# Patient Record
Sex: Female | Born: 1965 | Race: White | Hispanic: No | Smoking: Never smoker
Health system: Southern US, Community
[De-identification: ages and names within clinical notes are randomized; demographics above are authoritative.]

## PROBLEM LIST (undated history)

## (undated) DIAGNOSIS — R112 Nausea with vomiting, unspecified: Secondary | ICD-10-CM

## (undated) DIAGNOSIS — E039 Hypothyroidism, unspecified: Secondary | ICD-10-CM

## (undated) DIAGNOSIS — S92919A Unspecified fracture of unspecified toe(s), initial encounter for closed fracture: Secondary | ICD-10-CM

## (undated) DIAGNOSIS — K631 Perforation of intestine (nontraumatic): Secondary | ICD-10-CM

## (undated) DIAGNOSIS — IMO0002 Reserved for concepts with insufficient information to code with codable children: Secondary | ICD-10-CM

## (undated) DIAGNOSIS — R51 Headache: Secondary | ICD-10-CM

## (undated) DIAGNOSIS — Z9889 Other specified postprocedural states: Secondary | ICD-10-CM

## (undated) DIAGNOSIS — Z903 Acquired absence of stomach [part of]: Secondary | ICD-10-CM

## (undated) DIAGNOSIS — M1990 Unspecified osteoarthritis, unspecified site: Secondary | ICD-10-CM

## (undated) DIAGNOSIS — R87619 Unspecified abnormal cytological findings in specimens from cervix uteri: Secondary | ICD-10-CM

## (undated) DIAGNOSIS — G43909 Migraine, unspecified, not intractable, without status migrainosus: Secondary | ICD-10-CM

## (undated) DIAGNOSIS — D510 Vitamin B12 deficiency anemia due to intrinsic factor deficiency: Secondary | ICD-10-CM

## (undated) DIAGNOSIS — E079 Disorder of thyroid, unspecified: Secondary | ICD-10-CM

## (undated) DIAGNOSIS — H269 Unspecified cataract: Secondary | ICD-10-CM

## (undated) DIAGNOSIS — R011 Cardiac murmur, unspecified: Secondary | ICD-10-CM

## (undated) DIAGNOSIS — K219 Gastro-esophageal reflux disease without esophagitis: Secondary | ICD-10-CM

## (undated) DIAGNOSIS — F419 Anxiety disorder, unspecified: Secondary | ICD-10-CM

## (undated) DIAGNOSIS — K912 Postsurgical malabsorption, not elsewhere classified: Secondary | ICD-10-CM

## (undated) DIAGNOSIS — Z87442 Personal history of urinary calculi: Secondary | ICD-10-CM

## (undated) DIAGNOSIS — F329 Major depressive disorder, single episode, unspecified: Secondary | ICD-10-CM

## (undated) DIAGNOSIS — F32A Depression, unspecified: Secondary | ICD-10-CM

## (undated) DIAGNOSIS — D649 Anemia, unspecified: Secondary | ICD-10-CM

## (undated) HISTORY — DX: Unspecified fracture of unspecified toe(s), initial encounter for closed fracture: S92.919A

## (undated) HISTORY — PX: CERVICAL BIOPSY  W/ LOOP ELECTRODE EXCISION: SUR135

## (undated) HISTORY — PX: LASIK: SHX215

## (undated) HISTORY — PX: TONSILLECTOMY: SUR1361

## (undated) HISTORY — DX: Vitamin B12 deficiency anemia due to intrinsic factor deficiency: D51.0

## (undated) HISTORY — PX: NOSE SURGERY: SHX723

## (undated) HISTORY — DX: Disorder of thyroid, unspecified: E07.9

## (undated) HISTORY — DX: Personal history of urinary calculi: Z87.442

## (undated) HISTORY — DX: Unspecified cataract: H26.9

## (undated) HISTORY — DX: Unspecified abnormal cytological findings in specimens from cervix uteri: R87.619

## (undated) HISTORY — DX: Postsurgical malabsorption, not elsewhere classified: K91.2

## (undated) HISTORY — DX: Reserved for concepts with insufficient information to code with codable children: IMO0002

## (undated) HISTORY — PX: CHOLECYSTECTOMY: SHX55

## (undated) HISTORY — DX: Anemia, unspecified: D64.9

## (undated) HISTORY — DX: Acquired absence of stomach (part of): Z90.3

## (undated) HISTORY — DX: Migraine, unspecified, not intractable, without status migrainosus: G43.909

---

## 1991-12-06 HISTORY — PX: CERVIX LESION DESTRUCTION: SHX591

## 2000-02-11 ENCOUNTER — Other Ambulatory Visit: Admission: RE | Admit: 2000-02-11 | Discharge: 2000-02-11 | Payer: Self-pay | Admitting: Obstetrics and Gynecology

## 2000-12-05 HISTORY — PX: GASTRIC BYPASS: SHX52

## 2001-03-21 ENCOUNTER — Other Ambulatory Visit: Admission: RE | Admit: 2001-03-21 | Discharge: 2001-03-21 | Payer: Self-pay | Admitting: Obstetrics and Gynecology

## 2002-02-26 ENCOUNTER — Other Ambulatory Visit: Admission: RE | Admit: 2002-02-26 | Discharge: 2002-02-26 | Payer: Self-pay | Admitting: Obstetrics and Gynecology

## 2003-04-02 ENCOUNTER — Other Ambulatory Visit: Admission: RE | Admit: 2003-04-02 | Discharge: 2003-04-02 | Payer: Self-pay | Admitting: Obstetrics and Gynecology

## 2003-05-13 ENCOUNTER — Encounter: Payer: Self-pay | Admitting: Emergency Medicine

## 2003-05-13 ENCOUNTER — Emergency Department (HOSPITAL_COMMUNITY): Admission: EM | Admit: 2003-05-13 | Discharge: 2003-05-13 | Payer: Self-pay | Admitting: Emergency Medicine

## 2003-06-10 ENCOUNTER — Ambulatory Visit (HOSPITAL_BASED_OUTPATIENT_CLINIC_OR_DEPARTMENT_OTHER): Admission: RE | Admit: 2003-06-10 | Discharge: 2003-06-10 | Payer: Self-pay | Admitting: Otolaryngology

## 2003-06-10 ENCOUNTER — Encounter (INDEPENDENT_AMBULATORY_CARE_PROVIDER_SITE_OTHER): Payer: Self-pay | Admitting: *Deleted

## 2004-03-03 ENCOUNTER — Ambulatory Visit (HOSPITAL_COMMUNITY): Admission: RE | Admit: 2004-03-03 | Discharge: 2004-03-03 | Payer: Self-pay | Admitting: Family Medicine

## 2004-03-09 ENCOUNTER — Ambulatory Visit (HOSPITAL_COMMUNITY): Admission: RE | Admit: 2004-03-09 | Discharge: 2004-03-09 | Payer: Self-pay | Admitting: Family Medicine

## 2004-04-13 ENCOUNTER — Other Ambulatory Visit: Admission: RE | Admit: 2004-04-13 | Discharge: 2004-04-13 | Payer: Self-pay | Admitting: Obstetrics and Gynecology

## 2005-01-18 ENCOUNTER — Ambulatory Visit (HOSPITAL_COMMUNITY): Admission: RE | Admit: 2005-01-18 | Discharge: 2005-01-18 | Payer: Self-pay | Admitting: Family Medicine

## 2005-07-13 ENCOUNTER — Other Ambulatory Visit: Admission: RE | Admit: 2005-07-13 | Discharge: 2005-07-13 | Payer: Self-pay | Admitting: Obstetrics and Gynecology

## 2005-08-22 ENCOUNTER — Emergency Department (HOSPITAL_COMMUNITY): Admission: EM | Admit: 2005-08-22 | Discharge: 2005-08-22 | Payer: Self-pay | Admitting: *Deleted

## 2006-12-05 HISTORY — PX: TUBAL LIGATION: SHX77

## 2006-12-09 ENCOUNTER — Inpatient Hospital Stay (HOSPITAL_COMMUNITY): Admission: AD | Admit: 2006-12-09 | Discharge: 2006-12-09 | Payer: Self-pay | Admitting: Obstetrics and Gynecology

## 2006-12-20 ENCOUNTER — Encounter (INDEPENDENT_AMBULATORY_CARE_PROVIDER_SITE_OTHER): Payer: Self-pay | Admitting: *Deleted

## 2006-12-20 ENCOUNTER — Inpatient Hospital Stay (HOSPITAL_COMMUNITY): Admission: AD | Admit: 2006-12-20 | Discharge: 2006-12-23 | Payer: Self-pay | Admitting: Obstetrics and Gynecology

## 2006-12-24 ENCOUNTER — Ambulatory Visit: Admission: RE | Admit: 2006-12-24 | Discharge: 2006-12-24 | Payer: Self-pay | Admitting: Obstetrics and Gynecology

## 2006-12-24 ENCOUNTER — Encounter: Admission: RE | Admit: 2006-12-24 | Discharge: 2007-01-23 | Payer: Self-pay | Admitting: Obstetrics and Gynecology

## 2007-01-01 ENCOUNTER — Ambulatory Visit: Admission: RE | Admit: 2007-01-01 | Discharge: 2007-01-01 | Payer: Self-pay | Admitting: Obstetrics and Gynecology

## 2007-01-24 ENCOUNTER — Encounter: Admission: RE | Admit: 2007-01-24 | Discharge: 2007-02-20 | Payer: Self-pay | Admitting: Obstetrics and Gynecology

## 2007-12-25 ENCOUNTER — Other Ambulatory Visit: Admission: RE | Admit: 2007-12-25 | Discharge: 2007-12-25 | Payer: Self-pay | Admitting: Obstetrics and Gynecology

## 2009-02-20 ENCOUNTER — Other Ambulatory Visit: Admission: RE | Admit: 2009-02-20 | Discharge: 2009-02-20 | Payer: Self-pay | Admitting: Obstetrics and Gynecology

## 2009-07-05 ENCOUNTER — Emergency Department (HOSPITAL_COMMUNITY): Admission: EM | Admit: 2009-07-05 | Discharge: 2009-07-05 | Payer: Self-pay | Admitting: Family Medicine

## 2010-01-01 ENCOUNTER — Emergency Department (HOSPITAL_COMMUNITY): Admission: EM | Admit: 2010-01-01 | Discharge: 2010-01-01 | Payer: Self-pay | Admitting: Emergency Medicine

## 2010-01-01 ENCOUNTER — Emergency Department (HOSPITAL_COMMUNITY): Admission: EM | Admit: 2010-01-01 | Discharge: 2010-01-01 | Payer: Self-pay | Admitting: Family Medicine

## 2010-03-28 ENCOUNTER — Emergency Department (HOSPITAL_COMMUNITY): Admission: EM | Admit: 2010-03-28 | Discharge: 2010-03-28 | Payer: Self-pay | Admitting: Emergency Medicine

## 2010-04-17 ENCOUNTER — Encounter: Admission: RE | Admit: 2010-04-17 | Discharge: 2010-04-17 | Payer: Self-pay | Admitting: Diagnostic Neuroimaging

## 2010-12-26 ENCOUNTER — Encounter: Payer: Self-pay | Admitting: Family Medicine

## 2011-01-31 ENCOUNTER — Inpatient Hospital Stay (INDEPENDENT_AMBULATORY_CARE_PROVIDER_SITE_OTHER)
Admission: RE | Admit: 2011-01-31 | Discharge: 2011-01-31 | Disposition: A | Discharge status: Home / Self Care | Payer: 59 | Source: Ambulatory Visit | Attending: Family Medicine | Admitting: Family Medicine

## 2011-01-31 DIAGNOSIS — J019 Acute sinusitis, unspecified: Secondary | ICD-10-CM

## 2011-02-20 LAB — POCT I-STAT, CHEM 8
BUN: 8 mg/dL (ref 6–23)
Calcium, Ion: 1.18 mmol/L (ref 1.12–1.32)
Chloride: 104 mEq/L (ref 96–112)
Glucose, Bld: 96 mg/dL (ref 70–99)
HCT: 44 % (ref 36.0–46.0)
Hemoglobin: 15 g/dL (ref 12.0–15.0)
Sodium: 137 mEq/L (ref 135–145)

## 2011-02-20 LAB — GLUCOSE, CAPILLARY
Glucose-Capillary: 110 mg/dL — ABNORMAL HIGH (ref 70–99)
Glucose-Capillary: 61 mg/dL — ABNORMAL LOW (ref 70–99)
Glucose-Capillary: 73 mg/dL (ref 70–99)

## 2011-02-20 LAB — POCT PREGNANCY, URINE: Preg Test, Ur: NEGATIVE

## 2011-03-16 LAB — HM PAP SMEAR

## 2011-04-22 NOTE — Op Note (Signed)
   [name], [name]                            ACCOUNT NO.:  [id number]   MEDICAL RECORD NO.:  [id number]                   PATIENT TYPE:  AMB   LOCATION:  DSC                                  FACILITY:  [facility]   PHYSICIAN:  Christopher E. Ezzard Standing, M.D.         DATE OF BIRTH:  September 15, 1966   DATE OF PROCEDURE:  06/10/2003  DATE OF DISCHARGE:                                 OPERATIVE REPORT   PREOPERATIVE DIAGNOSIS:  Recurrent tonsillitis.   POSTOPERATIVE DIAGNOSIS:  Recurrent tonsillitis.   OPERATION:  Tonsillectomy.   SURGEON:  Kristine Garbe. Ezzard Standing, M.D.   ANESTHESIA:  General endotracheal.   COMPLICATIONS:  None.   BRIEF CLINICAL NOTE:  Megan Davila is a 45 year old female who has had  several tonsil infections over the last several years.  Currently three or  four positive strep tests a year.  Because of recurrent strep, she is taken  to the operating room at this time for a tonsillectomy.   DESCRIPTION OF PROCEDURE:  After adequate endotracheal anesthesia, a mouth  gag is used to expose the oropharynx.  The left and right tonsils were  dissected from the tonsillar fossae using a cautery. Care was taken to  preserve the anterior and posterior tonsillar pillars as well as the uvula.  The tonsils were cryptic, embedded, with a large amount of cryptic disease  within the tonsil.  Hemostasis was obtained with cautery.  After obtaining  adequate hemostasis, the procedure was completed.  Megan Davila was awoken from  anesthesia and transferred to the recovery room postop doing well.   DISPOSITION:  Megan Davila is discharged home later this morning on amoxicillin  suspension 400 mg b.i.d. for one week, Tylenol and Lortab Elixir 15-30 mL  q.4h. p.r.n. pain.  We will have her follow up in my office in two weeks for  recheck.                                               Kristine Garbe. Ezzard Standing, M.D.    CEN/MEDQ  D:  06/10/2003  T:  06/10/2003  Job:  [id number]

## 2011-04-22 NOTE — Op Note (Signed)
Megan Davila, Megan Davila                ACCOUNT NO.:  [id number]   MEDICAL RECORD NO.:  [id number]          PATIENT TYPE:  INP   LOCATION:  9120                          FACILITY:  [facility]   PHYSICIAN:  Randye Lobo, M.D.   DATE OF BIRTH:  [birth year]   DATE OF PROCEDURE:  12/20/2006  DATE OF DISCHARGE:                               OPERATIVE REPORT   PREOPERATIVE DIAGNOSIS:  1. Intrauterine gestation at 36 weeks.  2. Failure to progress.  3. Desire for permanent sterilization.   POSTOPERATIVE DIAGNOSIS:  1. Intrauterine gestation at 36 weeks.  2. Failure to progress.  3. Occiput posterior position.  4. Desire for permanent sterilization.   PROCEDURE:  Primary low segment transverse cesarean section with  ligation of descending branch of left uterine artery, bilateral tubal  ligation by the modified Pomeroy technique.   SURGEON:  Randye Lobo, M.D.   ANESTHESIA:  Epidural.   IV FLUIDS:  3000 mL Ringer's lactate.   ESTIMATED BLOOD LOSS:  800 mL.   URINE OUTPUT:  300 mL.   COMPLICATIONS:  None.   INDICATIONS FOR PROCEDURE:  The patient is a 45 year old gravida 2, para  0-0-1-0, Caucasian female admitted at 47 weeks' gestation (EDC of  January 17, 2007, by 13 week ultrasound), who presented with  contractions and spontaneous rupture of membranes at 0500 on the day of  admission.  The amniotic fluid was clear.  Upon arrival to [facility] [place], the patient was noted to be 9 cm dilated and was  immediately brought down to the labor and delivery suite.  The patient's  antepartum course had been significant for an unknown group B Strep  status.  The patient also had advanced maternal age status and declined  genetic testing after having an ultrasound and genetic counseling  performed by the high risk OB service at [facility].  The patient  did have a succenturiate lobe of the placenta.  There was a family  history of familial hemophilia.  The patient  previously had a history of  gastric bypass surgery along with cryotherapy and a LEEP procedure to  the cervix.   Upon admission, the fetal heart rate tracing was reassuring.  The  patient received ampicillin IV for group B strep prophylaxis.  She did  go on to receive some Stadol for analgesia and eventually was given an  epidural when her cervix did not progress quickly beyond 9 cm.  At one  point, the patient was thought to be completely dilated with the vertex  in the occiput posterior position and evidence of some caput.  The  patient did have some irregular and mild contractions at this time and  after initially giving an effort for pushing, the patient was allowed to  stop and she received some Pitocin.   Upon further reassessment of the patient's labor, the patient's cervix  was noted to be only 9 cm dilated with a swollen anterior lip and the  vertex at 1+ station.  The fetal heart rate tracing did remain  reassuring throughout the patient's labor.  She was  given a diagnosis at  this time of failure to progress and a plan was made to proceed with a  primary low segment transverse cesarean section.  The patient did  request permanent sterilization and a plan was also made to proceed with  a bilateral tubal ligation.  The risks, benefits, and alternatives of  the above were discussed with the patient who wished to proceed.  The  patient understood the nature of the tubal ligation being a permanent  procedure, although with a failure rate of approximately 1 to 250 to 1  in 300 which may result in either an intrauterine or ectopic pregnancy.   FINDINGS:  A viable female was delivered at 1500 in the occiput posterior  position.  Apgars were 9 at 1 minute and 9 at 5 minutes.  The weight was  5 pounds 4 ounces.  The amniotic fluid was clear.  The placenta was  noted to be densely adherent to the superior and anterior uterine fundus  and it was removed with some effort and ultimately  sent to pathology.  The uterus, tubes and ovaries were noted to be unremarkable.   There was an extension of the lower uterine segment incision on the  patient's left hand side and there was bleeding from the descending  branch of the uterine artery on that side which was ultimately  controlled with a Antony Salmon type stitch of both 0 chromic and then  ultimately 2-0 chromic.  The ureter was noted to palpate lateral to the  sutures that were placed immediately along the left lateral vagina.   SPECIMENS:  The placenta was sent to pathology.   DESCRIPTION OF PROCEDURE:  The patient was escorted from her labor and  delivery suite down to the operating room.  Her epidural was dosed for  surgical anesthesia.  The patient was placed in the supine position with  a left lateral tilt.  A Foley catheter had been previously placed inside  the bladder.  The abdomen was sterilely prepped and draped.   A Pfannenstiel incision was created sharply with a scalpel.  It was  carried down to the fascia using a combination of sharp dissection with  a scalpel and with monopolar cautery to create hemostasis.  The fascia  was then incised in the midline with a scalpel and the incision was  extended bilaterally with the Mayo scissors.  The rectus muscles were  dissected off of the fascia superiorly and inferiorly.  The rectus  muscles were then sharply divided in the midline.  The parietal  peritoneum was elevated with two hemostat clamps and entered sharply.  The incision was extended cranially and caudally.   The lower uterine segment was exposed with the bladder retractor and the  bladder flap was then sharply created.  A transverse lower uterine  segment incision was then created with a scalpel and the incision was  extended bilaterally in an upward fashion using blunt dissection.  A  hand was inserted through the uterine incision and the vertex was delivered through the incision without difficulty followed  by the  remainder of the infant.  The nares and mouth were suctioned and the  remainder of the infant was completely delivered and the baby was noted  to be in vigorous condition.  The cord was doubly clamped and cut and  the newborn was carried over to the awaiting pediatricians.   At this time, the placenta was manually extracted and noted to be very  densely adherent to  the uterine fundus.  Manual curettage was performed  to ensure entire removal of the placental specimen.  Lap pads were also  used to perform a gentle curettage of the uterine fundus.  No remaining  products of conception were appreciated.  The uterus was exteriorized  for its closure.  The patient did receive Ancef 1 gram IV at cord clamp  along with Pitocin 20 units IV after removal of the placenta.  The  uterine incision was closed beginning at the extension on the patient's  left hand side and continuing to the apex on the right hand side.  The  first layer was a running locked layer and the second was an imbricating  layer of #1 chromic.   The tubal ligation was performed next.  The left fallopian tube was  grasped with a Babcock clamp and followed to its fimbriated end.  It was  then free tied with a free tie of 0 plain that suture to create a  knuckle of tissue along the isthmic portion of the tube.  A hemostat was  brought through the mesosalpinx and an additional free tie of 2-0 plain  gut suture was placed at the base of each knuckle of tissue.  The  intervening portion of the fallopian tube was excised and sent to  pathology.  The same procedure that was performed on the left hand side  was then repeated on the right hand side after the right fallopian tube  was grasped and followed to its fimbriated end.  Again, the portion of  the right fallopian tube was sent to pathology.   At this time, the operative sites were all re-examined and there was  some bleeding noted at the angle on the patient's left hand  side were  the extension was which extended down into the vagina.  There was a  pumping vessel that appeared to be a descending branch of the uterine  artery.  Ther ureter was identified and an O'Leary stitch of #1 chromic  was placed.  However, this initially did not control the bleeding well.  Two additional figure-of-eight sutures of 2-0 chromic were placed and  these did control the bleeding well.   The uterus was returned to the peritoneal cavity which was irrigated and  suctioned and all operative sites were examined and found to be  hemostatic.  The abdomen was closed at this time.  The parietal  peritoneum was closed with a running suture of 3-0 Vicryl.  The rectus  muscles were reapproximated in the midline with interrupted sutures of  #1 chromic.  The fascia was closed with a running suture of zero Vicryl.  The subcutaneous tissue was irrigated and suctioned and made hemostatic with monopolar cautery.  Interrupted sutures of 2-0 plain were placed in  the subcutaneous layer and the skin was closed with staples.  A sterile  pressure bandage was placed over this.   This concluded the patient's procedure.  There were no complications.  All needle, instrument, and sponge counts were correct.      Randye Lobo, M.D.  Electronically Signed     BES/MEDQ  D:  12/20/2006  T:  12/20/2006  Job:  [id number]

## 2011-04-22 NOTE — Discharge Summary (Signed)
Megan, Davila                ACCOUNT NO.:  [id number]   MEDICAL RECORD NO.:  [id number]          PATIENT TYPE:  INP   LOCATION:  9120                          FACILITY:  [facility]   PHYSICIAN:  Ilda Mori, M.D.   DATE OF BIRTH:  [birth year]   DATE OF ADMISSION:  12/20/2006  DATE OF DISCHARGE:  12/23/2006                               DISCHARGE SUMMARY   FINAL DIAGNOSES:  1. Intrauterine pregnancy at 57 weeks' gestation.  2. Spontaneous rupture of membranes, in early labor.  3. Failure to progress.  4. Occiput posterior presentation.  5. Desire for permanent sterilization.   PROCEDURE:  Primary low segment transverse cesarean section with  ligation of descending branch of left uterine artery and bilateral tubal  ligation using the modified Pomeroy technique.  Surgeon:  Dr. Conley Simmonds.  Complications:  None.   This 45 year old G2, P 0-0-1-0, presents at 73 weeks' gestation with  spontaneous rupture of membranes early that morning and onset of regular  contractions.  When the patient arrived at the hospital she was already  9 cm dilated, was brought directly to labor and delivery.  The patient's  antepartum course up to this point had been complicated by advanced  maternal age.  The patient declined fetal screening.  She did have some  genetic counseling but no amniocentesis or first trimester screening was  obtained.  The patient also had a history of gastric bypass, a history  of abnormal Pap smears.  She had had a conization performed as well as a  LEEP and also in her pregnancy there was noted to be a succenturiate  lobe of the placenta that was noted on ultrasound.  Her group B strep  status at this time was unknown.  She was started on some ampicillin  secondary to her unknown status and Stadol for comfort.  Once the  patient was given an epidural for her pain control, her cervix had not  progressed beyond 9 cm of dilation.  At one point the patient was  thought to be  completely dilated with the vertex in the occiput  posterior presentation and some evidence of caput was noted.  The  patient was having irregular mild contractions at this time.  The  patient when she was further examined was noted to only be 9 cm dilated  with a swollen anterior lip at a +1 station.  She was given a diagnosis  of failure to progress at this time was taken to the operating room for  cesarean section.  The patient also expressed her desire for permanent  sterilization and a discussion was held with the patient about the risks  and the benefit.  She was taken to the operating room on December 20, 2006, by Dr. Conley Simmonds, where a primary low segment transverse  cesarean section was performed with the delivery of a 5 pound 4 ounce  female infant with Apgars of 9 and 9.  The delivery went without  complication.  After delivery the placenta was manually extracted and  noticed to be densely adherent to the uterine fundus.  Manual curettage  was performed at this time.  The patient did receive 1 g of IV Ancef at  this point at the cord clamping.  At this point a bilateral tubal  ligation was performed without complications.  Once everything was  performed, the operative sites were well-examined, there was noted just  to be some bleeding on the patient's left side which extended down into  the vagina, and there was a pumping vessel that was descending off the  uterine artery.  At this point this was ligated with additional sutures  and the bleeding was under good control.  The patient was taken to the  recovery room without complications.  Her postoperative course was  benign without any significant fevers.  She did have some postoperative  blood pressures which started to elevate.  She was started on oral  Procardia 10 mg t.i.d.  By postoperative day #3 the patient was still  having some mild postpartum hypertension but was ready for discharge.  She was sent home on a regular  diet, told to decrease her activities,  told to continue her prenatal vitamins and Procardia XL 30 mg once in  the morning, was given Tylox #30 one to two every 4-6 hours as needed  for pain, and was to follow up in our office in 4 weeks and, of course,  to call with any increased bleeding, pain or problems.   LABS ON DISCHARGE:  The patient had a hemoglobin of 9.6, white blood  cell count of 7.7, and platelets of 208,000.      Leilani Able, P.A.-C.      Ilda Mori, M.D.  Electronically Signed    MB/MEDQ  D:  01/22/2007  T:  01/22/2007  Job:  [id number]

## 2011-09-16 ENCOUNTER — Other Ambulatory Visit: Payer: Self-pay | Admitting: Obstetrics and Gynecology

## 2011-10-03 ENCOUNTER — Other Ambulatory Visit: Payer: Self-pay | Admitting: Obstetrics and Gynecology

## 2011-10-06 ENCOUNTER — Encounter (HOSPITAL_COMMUNITY)
Admission: RE | Admit: 2011-10-06 | Discharge: 2011-10-06 | Disposition: A | Discharge status: Home / Self Care | Payer: 59 | Source: Ambulatory Visit | Attending: Obstetrics and Gynecology | Admitting: Obstetrics and Gynecology

## 2011-10-06 ENCOUNTER — Encounter (HOSPITAL_COMMUNITY): Payer: Self-pay

## 2011-10-06 HISTORY — DX: Gastro-esophageal reflux disease without esophagitis: K21.9

## 2011-10-06 HISTORY — DX: Headache: R51

## 2011-10-06 HISTORY — DX: Anxiety disorder, unspecified: F41.9

## 2011-10-06 HISTORY — DX: Cardiac murmur, unspecified: R01.1

## 2011-10-06 HISTORY — DX: Depression, unspecified: F32.A

## 2011-10-06 HISTORY — DX: Major depressive disorder, single episode, unspecified: F32.9

## 2011-10-06 HISTORY — DX: Other specified postprocedural states: Z98.890

## 2011-10-06 HISTORY — DX: Other specified postprocedural states: R11.2

## 2011-10-06 LAB — CBC
HCT: 41.8 % (ref 36.0–46.0)
Hemoglobin: 13.7 g/dL (ref 12.0–15.0)
MCHC: 32.8 g/dL (ref 30.0–36.0)
Platelets: 314 10*3/uL (ref 150–400)
RDW: 13.1 % (ref 11.5–15.5)

## 2011-10-06 NOTE — Patient Instructions (Signed)
YOUR PROCEDURE IS SCHEDULED ON: 10/14/11  ENTER THROUGH THE MAIN ENTRANCE OF [facility] AT:1030am  USE DESK PHONE AND DIAL [id number] TO INFORM us OF YOUR ARRIVAL  CALL [phone] IF YOU HAVE ANY QUESTIONS OR PROBLEMS PRIOR TO YOUR ARRIVAL.  REMEMBER: DO NOT EAT AFTER MIDNIGHT :  SPECIAL INSTRUCTIONS: clear liquids ok until 8am Fri   YOU MAY BRUSH YOUR TEETH THE MORNING OF SURGERY   TAKE THESE MEDICINES THE DAY OF SURGERY before 8am: Xanax,Prevacid, Prozac   DO NOT WEAR JEWELRY, EYE MAKEUP, LIPSTICK OR DARK FINGERNAIL POLISH DO NOT WEAR LOTIONS OR DEODORANT DO NOT SHAVE FOR 48 HOURS PRIOR TO SURGERY  YOU WILL NOT BE ALLOWED TO DRIVE YOURSELF HOME.  NAME OF DRIVER: Aviya Jarvie- [phone]

## 2011-10-14 ENCOUNTER — Ambulatory Visit (HOSPITAL_COMMUNITY)
Admission: RE | Admit: 2011-10-14 | Discharge: 2011-10-14 | Disposition: A | Discharge status: Home / Self Care | Payer: 59 | Source: Ambulatory Visit | Attending: Obstetrics and Gynecology | Admitting: Obstetrics and Gynecology

## 2011-10-14 ENCOUNTER — Other Ambulatory Visit: Payer: Self-pay

## 2011-10-14 ENCOUNTER — Encounter (HOSPITAL_COMMUNITY): Payer: Self-pay | Admitting: Anesthesiology

## 2011-10-14 ENCOUNTER — Ambulatory Visit (HOSPITAL_COMMUNITY): Payer: 59 | Admitting: Anesthesiology

## 2011-10-14 ENCOUNTER — Encounter (HOSPITAL_COMMUNITY)
Admission: RE | Disposition: A | Discharge status: Home / Self Care | Payer: Self-pay | Source: Ambulatory Visit | Attending: Obstetrics and Gynecology

## 2011-10-14 DIAGNOSIS — Z01812 Encounter for preprocedural laboratory examination: Secondary | ICD-10-CM | POA: Insufficient documentation

## 2011-10-14 DIAGNOSIS — Z01818 Encounter for other preprocedural examination: Secondary | ICD-10-CM | POA: Insufficient documentation

## 2011-10-14 DIAGNOSIS — N920 Excessive and frequent menstruation with regular cycle: Secondary | ICD-10-CM | POA: Insufficient documentation

## 2011-10-14 DIAGNOSIS — N840 Polyp of corpus uteri: Secondary | ICD-10-CM | POA: Insufficient documentation

## 2011-10-14 HISTORY — PX: HYSTEROSCOPY WITH D & C: SHX1775

## 2011-10-14 LAB — URINALYSIS, ROUTINE W REFLEX MICROSCOPIC
Glucose, UA: NEGATIVE mg/dL
Hgb urine dipstick: NEGATIVE
Ketones, ur: 15 mg/dL — AB
Leukocytes, UA: NEGATIVE
Nitrite: NEGATIVE
Protein, ur: NEGATIVE mg/dL
Specific Gravity, Urine: >1.03 — ABNORMAL HIGH (ref 1.005–1.030)
Urobilinogen, UA: 1 mg/dL (ref 0.0–1.0)
pH: 5.5 (ref 5.0–8.0)

## 2011-10-14 LAB — PREGNANCY, URINE: Preg Test, Ur: NEGATIVE

## 2011-10-14 SURGERY — DILATATION AND CURETTAGE /HYSTEROSCOPY
Anesthesia: General | Site: Vagina | Wound class: Clean Contaminated

## 2011-10-14 MED ORDER — LIDOCAINE HCL (CARDIAC) 20 MG/ML IV SOLN
INTRAVENOUS | Status: DC | PRN
  Administered 2011-10-14: 60 mg via INTRAVENOUS

## 2011-10-14 MED ORDER — LIDOCAINE HCL (CARDIAC) 20 MG/ML IV SOLN
INTRAVENOUS | Status: AC
  Filled 2011-10-14: qty 5

## 2011-10-14 MED ORDER — MIDAZOLAM HCL 2 MG/2ML IJ SOLN
INTRAMUSCULAR | Status: AC
  Filled 2011-10-14: qty 2

## 2011-10-14 MED ORDER — FENTANYL CITRATE 0.05 MG/ML IJ SOLN
INTRAMUSCULAR | Status: AC
  Filled 2011-10-14: qty 2

## 2011-10-14 MED ORDER — SCOPOLAMINE 1 MG/3DAYS TD PT72
MEDICATED_PATCH | TRANSDERMAL | Status: AC
  Administered 2011-10-14: 1.5 mg via TRANSDERMAL
  Filled 2011-10-14: qty 1

## 2011-10-14 MED ORDER — DEXAMETHASONE SODIUM PHOSPHATE 10 MG/ML IJ SOLN
INTRAMUSCULAR | Status: AC
  Filled 2011-10-14: qty 1

## 2011-10-14 MED ORDER — CEFAZOLIN SODIUM 1-5 GM-% IV SOLN
1.0000 g | INTRAVENOUS | Status: AC
  Administered 2011-10-14: 1 g via INTRAVENOUS

## 2011-10-14 MED ORDER — PROPOFOL 10 MG/ML IV EMUL
INTRAVENOUS | Status: AC
  Filled 2011-10-14: qty 20

## 2011-10-14 MED ORDER — HYDROCODONE-ACETAMINOPHEN 5-325 MG PO TABS
ORAL_TABLET | ORAL | Status: AC
  Administered 2011-10-14: 1 via ORAL
  Filled 2011-10-14: qty 1

## 2011-10-14 MED ORDER — MIDAZOLAM HCL 5 MG/5ML IJ SOLN
INTRAMUSCULAR | Status: DC | PRN
  Administered 2011-10-14: 2 mg via INTRAVENOUS

## 2011-10-14 MED ORDER — SCOPOLAMINE 1 MG/3DAYS TD PT72
1.0000 | MEDICATED_PATCH | TRANSDERMAL | Status: DC
  Administered 2011-10-14: 1.5 mg via TRANSDERMAL

## 2011-10-14 MED ORDER — HYDROCODONE-ACETAMINOPHEN 5-500 MG PO TABS: 1.0000 | ORAL_TABLET | ORAL | Status: AC | PRN

## 2011-10-14 MED ORDER — LIDOCAINE HCL 1 % IJ SOLN
INTRAMUSCULAR | Status: DC | PRN
  Administered 2011-10-14: 10 mL

## 2011-10-14 MED ORDER — DEXAMETHASONE SODIUM PHOSPHATE 4 MG/ML IJ SOLN
INTRAMUSCULAR | Status: DC | PRN
  Administered 2011-10-14: 10 mg via INTRAVENOUS

## 2011-10-14 MED ORDER — ONDANSETRON HCL 4 MG/2ML IJ SOLN
INTRAMUSCULAR | Status: AC
  Filled 2011-10-14: qty 2

## 2011-10-14 MED ORDER — HYDROCODONE-ACETAMINOPHEN 5-325 MG PO TABS
1.0000 | ORAL_TABLET | Freq: Once | ORAL | Status: AC
  Administered 2011-10-14: 1 via ORAL

## 2011-10-14 MED ORDER — PROPOFOL 10 MG/ML IV EMUL
INTRAVENOUS | Status: DC | PRN
  Administered 2011-10-14: 200 mg via INTRAVENOUS

## 2011-10-14 MED ORDER — FENTANYL CITRATE 0.05 MG/ML IJ SOLN
INTRAMUSCULAR | Status: DC | PRN
  Administered 2011-10-14: 100 ug via INTRAVENOUS

## 2011-10-14 MED ORDER — FENTANYL CITRATE 0.05 MG/ML IJ SOLN: 25.0000 ug | INTRAMUSCULAR | Status: DC | PRN

## 2011-10-14 MED ORDER — LACTATED RINGERS IV SOLN
INTRAVENOUS | Status: DC
  Administered 2011-10-14: 50 mL/h via INTRAVENOUS
  Administered 2011-10-14: 11:00:00 via INTRAVENOUS

## 2011-10-14 MED ORDER — LACTATED RINGERS IV SOLN
INTRAVENOUS | Status: DC | PRN
  Administered 2011-10-14: 3000 mL via INTRAUTERINE

## 2011-10-14 MED ORDER — ONDANSETRON HCL 4 MG/2ML IJ SOLN
INTRAMUSCULAR | Status: DC | PRN
  Administered 2011-10-14: 4 mg via INTRAVENOUS

## 2011-10-14 SURGICAL SUPPLY — 11 items
ABLATOR ENDOMETRIAL BIPOLAR (ABLATOR) IMPLANT
CATH ROBINSON RED A/P 16FR (CATHETERS) ×3 IMPLANT
CLOTH BEACON ORANGE TIMEOUT ST (SAFETY) ×3 IMPLANT
CONTAINER PREFILL 10% NBF 60ML (FORM) ×3 IMPLANT
GLOVE BIO SURGEON STRL SZ 6.5 (GLOVE) ×6 IMPLANT
GOWN PREVENTION PLUS LG XLONG (DISPOSABLE) ×6 IMPLANT
NEEDLE SPNL 22GX3.5 QUINCKE BK (NEEDLE) ×3 IMPLANT
PACK HYSTEROSCOPY LF (CUSTOM PROCEDURE TRAY) ×3 IMPLANT
SYR CONTROL 10ML LL (SYRINGE) ×3 IMPLANT
TOWEL OR 17X24 6PK STRL BLUE (TOWEL DISPOSABLE) ×6 IMPLANT
WATER STERILE IRR 1000ML POUR (IV SOLUTION) ×3 IMPLANT

## 2011-10-14 NOTE — Transfer of Care (Signed)
Immediate Anesthesia Transfer of Care Note  Patient: Megan Davila  Procedure(s) Performed:  DILATATION AND CURETTAGE (D&C) /HYSTEROSCOPY  Patient Location: PACU  Anesthesia Type: General  Level of Consciousness: sedated  Airway & Oxygen Therapy: Patient Spontanous Breathing and Patient connected to nasal cannula oxygen  Post-op Assessment: Report given to PACU RN and Post -op Vital signs reviewed and stable  Post vital signs: Reviewed and stable  Complications: No apparent anesthesia complications

## 2011-10-14 NOTE — Anesthesia Preprocedure Evaluation (Addendum)
Anesthesia Evaluation  Patient identified by MRN, date of birth, ID band Patient awake    Reviewed: Allergy & Precautions, H&P , NPO status , Patient's Chart, lab work & pertinent test results, reviewed documented beta blocker date and time   History of Anesthesia Complications (+) PONV  Airway Mallampati: I      Dental  (+) Teeth Intact   Pulmonary neg pulmonary ROS,  clear to auscultation  Pulmonary exam normal       Cardiovascular Exercise Tolerance: Good + Valvular Problems/Murmurs (h/o heart murmur (not that I can detect)) regular Normal    Neuro/Psych  Headaches (migraines on topamax prn), PSYCHIATRIC DISORDERS (anxiety/depression)    GI/Hepatic Neg liver ROS, GERD-  Medicated,  Endo/Other  Negative Endocrine ROSS/p gastric bypass, lost 140#  Renal/GU negative Renal ROS  Genitourinary negative   Musculoskeletal   Abdominal   Peds  Hematology negative hematology ROS (+)   Anesthesia Other Findings   Reproductive/Obstetrics negative OB ROS                          Anesthesia Physical Anesthesia Plan  ASA: II  Anesthesia Plan: General   Post-op Pain Management:    Induction:   Airway Management Planned: LMA  Additional Equipment:   Intra-op Plan:   Post-operative Plan:   Informed Consent: I have reviewed the patients History and Physical, chart, labs and discussed the procedure including the risks, benefits and alternatives for the proposed anesthesia with the patient or authorized representative who has indicated his/her understanding and acceptance.   Dental Advisory Given  Plan Discussed with: CRNA and Surgeon  Anesthesia Plan Comments:         Anesthesia Quick Evaluation

## 2011-10-14 NOTE — Anesthesia Postprocedure Evaluation (Signed)
Anesthesia Post Note  Patient: Megan Davila  Procedure(s) Performed:  DILATATION AND CURETTAGE (D&C) /HYSTEROSCOPY  Anesthesia type: General  Patient location: PACU  Post pain: Pain level controlled  Post assessment: Post-op Vital signs reviewed  Last Vitals:  Filed Vitals:   10/14/11 1400  BP: 113/71  Pulse: 81  Temp: 37.1 C  Resp: 20    Post vital signs: Reviewed  Level of consciousness: sedated  Complications: No apparent anesthesia complications

## 2011-10-14 NOTE — Progress Notes (Signed)
Pre-op Note  Patient examined this morning.    OK to proceed.

## 2011-10-14 NOTE — Op Note (Signed)
NAMESANAE, [name] NO.:  [id number]  MEDICAL RECORD NO.:  [id number]  LOCATION:  [facility]                          FACILITY:  [facility]  PHYSICIAN:  Randye Lobo, M.D.   DATE OF BIRTH:  07/14/1966  DATE OF PROCEDURE:  10/14/2011 DATE OF DISCHARGE:                              OPERATIVE REPORT   PREOPERATIVE DIAGNOSIS:  Menometrorrhagia, endometrial polyp.  POSTOPERATIVE DIAGNOSIS:  Menometrorrhagia, intrauterine adhesions.  PROCEDURES:  Hysteroscopy and dilatation and curettage.  SURGEON:  Conley Simmonds, MD  ANESTHESIA:  General endotracheal.  IV FLUIDS:  1000 mL Ringer's lactate.  ESTIMATED BLOOD LOSS:  5 mL.  URINE OUTPUT:  5 mL.  LACTATED RINGER'S DEFICIT:  100 mL.  COMPLICATIONS:  None.  INDICATIONS FOR THE PROCEDURE:  The patient is a 45 year old, gravida 2, para 1-0-1-1 Caucasian female, who presented to the office with irregular and heavy menstrual bleeding with clotting onset in the spring of 2012.  The patient had temporary improvement with Prometrium therapy. When the patient had recurrence of her symptoms, an ultrasound was performed on September 16, 2011 which documented a uterus with small cystic areas in the endometrium.  The endometrial stripe measured 8.22 mm.  There was a simple 1.4 cm left ovarian cyst.  An endometrial biopsy on September 16, 2011 documented a benign endometrial polyp with disordered proliferative endometrium.  The patient is status post bilateral tubal ligation and she declines future childbearing.  A plan is now made to proceed with a hysteroscopy with dilation and curettage, endometrial polypectomy, and NovaSure endometrial ablation after risks, benefits, alternatives,and surgical goals have been discussed.  FINDINGS:  Exam under anesthesia revealed a small anteverted mobile uterus.  No adnexal masses were appreciated. Hysteroscopy demonstrated significant intrauterine scarring.  The uterus appeared to have a tubular  shape.  At first there was a question about whether or not there was a false passage that was created, however, it appeared that this was not the case and that the patient actually had a very scarred endometrial cavity.  There was no evidence of any obvious polyps or fibroids.  SPECIMEN:  Endometrial curettings were sent to pathology.  PROCEDURE IN DETAIL:  The patient was reidentified in the preoperative hold area.  She did receive Ancef 1 g IV for antibiotic prophylaxis and she received TED hose and PAS stockings for DVT prophylaxis.  The patient was taken to the operating room where general endotracheal anesthesia was induced.  She was placed in the dorsal lithotomy position.  The abdomen, vagina, and perineum were then sterilely prepped and draped.  Her bladder was catheterized of urine.  An exam under anesthesia was performed. A speculum was placed inside the vagina and a single-tooth tenaculum was placed on the anterior cervical lip.  The uterus was sounded to 7 cm at this time.  There was some scarring of the cervix but the sound was able to pass easily.  The cervix was then sequentially dilated to a #23 Pratt dilator.  The cervical length was then measured at 3.5 cm.  The diagnostic hysteroscope was inserted under the continuous infusion of lactated Ringer's solution.  The findings are as noted above.  The cervix  was dilated further to a #25 Pratt dilator and again the hysteroscope was carefully inserted to see if it was passing through a false channel.  This did not appear to be the case.  The hysteroscope was withdrawn at this time and a serrated curette was introduced into the endometrial cavity and curettings were performed and sent to pathology.  The procedure was concluded at this time.  The NovaSure endometrial ablation was not recommended due to the cavity shape.  A rollerball ablation was also not performed due to distortion of the patient's anatomy.  The patient  was cleansed of Betadine, awakened, and escorted to the recovery room in stable condition.  There were no complications to the procedure.  All needle, instrument, and sponge counts were correct.     Randye Lobo, M.D.     BES/MEDQ  D:  10/14/2011  T:  10/14/2011  Job:  [id number]

## 2011-10-14 NOTE — Brief Op Note (Signed)
10/14/2011  12:53 PM  PATIENT:  Megan Davila  45 y.o. female  PRE-OPERATIVE DIAGNOSIS:  MENOMETRORRHAGIA  POST-OPERATIVE DIAGNOSIS:  Menometrorrhagia, Intrauterine adhesions  PROCEDURE:  Procedure(s): DILATATION AND CURETTAGE (D&C) /HYSTEROSCOPY  SURGEON:  Surgeon(s): Jajuan Skoog A Silva  PHYSICIAN ASSISTANT:   ASSISTANTS:   ANESTHESIA:   general  EBL:  Total I/O In: 700 [I.V.:700] Out: 10 [Urine:5; Blood:5]  BLOOD ADMINISTERED:none  DRAINS: none   LOCAL MEDICATIONS USED:  LIDOCAINE 10 CC  SPECIMEN:  Source of Specimen:  Endometrial curettings  DISPOSITION OF SPECIMEN:  PATHOLOGY  COUNTS:  YES  TOURNIQUET:  * No tourniquets in log *  DICTATION: .Other Dictation: Dictation Number    PLAN OF CARE: Discharge to home after PACU  PATIENT DISPOSITION:  PACU - hemodynamically stable.   Delay start of Pharmacological VTE agent (>24hrs) due to surgical blood loss or risk of bleeding:  {YES/NO/NOT APPLICABLE:20182

## 2011-10-18 ENCOUNTER — Encounter (HOSPITAL_COMMUNITY): Payer: Self-pay | Admitting: Obstetrics and Gynecology

## 2012-02-10 ENCOUNTER — Emergency Department (INDEPENDENT_AMBULATORY_CARE_PROVIDER_SITE_OTHER)
Admission: EM | Admit: 2012-02-10 | Discharge: 2012-02-10 | Disposition: A | Discharge status: Home / Self Care | Payer: 59 | Source: Home / Self Care | Attending: Family Medicine | Admitting: Family Medicine

## 2012-02-10 ENCOUNTER — Emergency Department (INDEPENDENT_AMBULATORY_CARE_PROVIDER_SITE_OTHER): Payer: 59

## 2012-02-10 ENCOUNTER — Encounter (HOSPITAL_COMMUNITY): Payer: Self-pay

## 2012-02-10 DIAGNOSIS — J069 Acute upper respiratory infection, unspecified: Secondary | ICD-10-CM

## 2012-02-10 MED ORDER — DEXTROMETHORPHAN POLISTIREX 30 MG/5ML PO LQCR: 60.0000 mg | ORAL | Status: AC | PRN

## 2012-02-10 MED ORDER — HYDROCOD POLST-CHLORPHEN POLST 10-8 MG/5ML PO LQCR: 5.0000 mL | Freq: Two times a day (BID) | ORAL | Status: DC

## 2012-02-10 MED ORDER — AZITHROMYCIN 250 MG PO TABS: ORAL_TABLET | ORAL | Status: AC

## 2012-02-10 NOTE — ED Notes (Addendum)
C/o sick w cough, congestion; has reportedly coughed so much she has lost her voice, having green tinted secretions; NAD at present Sa her primary MD first of week, instructed to use Delsym (no relief) c/o syx are getting worse

## 2012-02-10 NOTE — ED Provider Notes (Signed)
History     CSN: [id number]  Arrival date & time 02/10/12  0911   First MD Initiated Contact with Patient 02/10/12 1024      Chief Complaint  Patient presents with  . Cough    (Consider location/radiation/quality/duration/timing/severity/associated sxs/prior treatment) Patient is a 46 y.o. female presenting with cough. The history is provided by the patient.  Cough This is a new problem. The current episode started more than 2 days ago. The problem has been gradually worsening. The cough is productive of purulent sputum. Associated symptoms include rhinorrhea and sore throat. She is not a smoker.    Past Medical History  Diagnosis Date  . Heart murmur   . GERD (gastroesophageal reflux disease)   . Headache   . Anxiety   . Depression   . PONV (postoperative nausea and vomiting)     Past Surgical History  Procedure Date  . Gastric bypass 2002  . Cholecystectomy   . Tubal ligation   . Cesarean section   . Tonsillectomy   . Lasik   . Nose surgery   . Hysteroscopy w/d&c 10/14/2011    Procedure: DILATATION AND CURETTAGE (D&C) /HYSTEROSCOPY;  Surgeon: Melony Overly;  Location: [facility];  Service: Gynecology;  Laterality: Bilateral;    History reviewed. No pertinent family history.  History  Substance Use Topics  . Smoking status: Never Smoker   . Smokeless tobacco: Not on file  . Alcohol Use: No    OB History    Grav Para Term Preterm Abortions TAB SAB Ect Mult Living                  Review of Systems  Constitutional: Negative.   HENT: Positive for congestion, sore throat, rhinorrhea, voice change and postnasal drip.   Respiratory: Positive for cough.   Gastrointestinal: Negative.     Allergies  Naproxen and Sulfa antibiotics  Home Medications   Current Outpatient Rx  Name Route Sig Dispense Refill  . ALPRAZOLAM 1 MG PO TABS Oral Take 1 mg by mouth 2 (two) times daily as needed. Anxiety      . AZITHROMYCIN 250 MG PO TABS  Take as directed on pack 6 each  0  . VITAMIN B-12 IJ Injection Inject 1 each as directed every 30 (thirty) days.      Marland Kitchen DEXTROMETHORPHAN POLISTIREX ER 30 MG/5ML PO LQCR Oral Take 10 mLs (60 mg total) by mouth as needed for cough. 89 mL 0  . ERGOCALCIFEROL 50000 UNITS PO CAPS Oral Take 50,000 Units by mouth once a week.      Marland Kitchen FERROUS SULFATE 325 (65 FE) MG PO TABS Oral Take 325 mg by mouth daily with breakfast.      . FEXOFENADINE HCL 180 MG PO TABS Oral Take 180 mg by mouth daily as needed. allergies     . FLUOXETINE HCL 20 MG PO CAPS Oral Take 20 mg by mouth daily.      Marland Kitchen LANSOPRAZOLE 30 MG PO CPDR Oral Take 30 mg by mouth daily as needed. Acid reflux      . MULTI-VITAMIN/MINERALS PO TABS Oral Take 1 tablet by mouth daily.      . TOPIRAMATE 50 MG PO TABS Oral Take 50 mg by mouth 2 (two) times daily as needed. headaches       BP 116/84  Pulse 88  Temp(Src) 99.1 F (37.3 C) (Oral)  Resp 18  SpO2 99%  Physical Exam  Nursing note and vitals reviewed. Constitutional: She is oriented  to person, place, and time. She appears well-developed and well-nourished.  HENT:  Head: Normocephalic.  Right Ear: External ear normal.  Left Ear: External ear normal.  Mouth/Throat: Mucous membranes are normal. Posterior oropharyngeal erythema present. No oropharyngeal exudate.  Cardiovascular: Normal rate, regular rhythm, normal heart sounds and intact distal pulses.   Pulmonary/Chest: She has decreased breath sounds. She has rhonchi.  Neurological: She is alert and oriented to person, place, and time.  Skin: Skin is warm and dry.  Psychiatric: She has a normal mood and affect.    ED Course  Procedures (including critical care time)  Labs Reviewed - No data to display Dg Chest 2 View  02/10/2012  *RADIOLOGY REPORT*  Clinical Data: Cough  CHEST - 2 VIEW  Comparison: 01/16/2010  Findings: Normal heart size.  Clear lungs.  No pneumothorax and no pleural effusion.  IMPRESSION: No active cardiopulmonary disease.  Original Report  Authenticated By: Donavan Burnet, M.D.     1. URI (upper respiratory infection)       MDM  X-rays reviewed and report per radiologist.         Linna Hoff, MD 02/10/12 [phone]

## 2012-02-10 NOTE — Discharge Instructions (Signed)
Drink plenty of fluids as discussed, use medicine as prescribed, and mucinex or delsym for cough. Return or see your doctor if further problems °

## 2012-03-15 ENCOUNTER — Emergency Department (INDEPENDENT_AMBULATORY_CARE_PROVIDER_SITE_OTHER)
Admission: EM | Admit: 2012-03-15 | Discharge: 2012-03-15 | Disposition: A | Discharge status: Home / Self Care | Payer: 59 | Source: Home / Self Care | Attending: Emergency Medicine | Admitting: Emergency Medicine

## 2012-03-15 ENCOUNTER — Encounter (HOSPITAL_COMMUNITY): Payer: Self-pay | Admitting: Emergency Medicine

## 2012-03-15 DIAGNOSIS — J329 Chronic sinusitis, unspecified: Secondary | ICD-10-CM

## 2012-03-15 MED ORDER — FEXOFENADINE-PSEUDOEPHED ER 60-120 MG PO TB12: 1.0000 | ORAL_TABLET | Freq: Two times a day (BID) | ORAL | Status: AC

## 2012-03-15 MED ORDER — AMOXICILLIN 500 MG PO CAPS: 500.0000 mg | ORAL_CAPSULE | Freq: Three times a day (TID) | ORAL | Status: AC

## 2012-03-15 MED ORDER — GUAIFENESIN-CODEINE 100-10 MG/5ML PO SYRP: 5.0000 mL | ORAL_SOLUTION | Freq: Three times a day (TID) | ORAL | Status: AC | PRN

## 2012-03-15 NOTE — ED Provider Notes (Signed)
History     CSN: [id number]  Arrival date & time 03/15/12  1613   First MD Initiated Contact with Patient 03/15/12 1719      Chief Complaint  Patient presents with  . URI    (Consider location/radiation/quality/duration/timing/severity/associated sxs/prior treatment) HPI Comments: Having symptoms for 6 days with more pressure (points to frontal sinuses) with ear discomfort and most coughing at night"  Patient is a 46 y.o. female presenting with URI. The history is provided by the patient. No language interpreter was used.  URI The primary symptoms include headaches, ear pain, sore throat, cough and nausea. Primary symptoms do not include fever, fatigue, swollen glands, wheezing, abdominal pain, vomiting, myalgias or rash. The current episode started 6 to 7 days ago. This is a new problem. The problem has not changed since onset. The headache is not associated with neck stiffness.  Symptoms associated with the illness include plugged ear sensation, facial pain, sinus pressure, congestion and rhinorrhea.    Past Medical History  Diagnosis Date  . Heart murmur   . GERD (gastroesophageal reflux disease)   . Headache   . Anxiety   . Depression   . PONV (postoperative nausea and vomiting)     Past Surgical History  Procedure Date  . Gastric bypass 2002  . Cholecystectomy   . Tubal ligation   . Cesarean section   . Tonsillectomy   . Lasik   . Nose surgery   . Hysteroscopy w/d&c 10/14/2011    Procedure: DILATATION AND CURETTAGE (D&C) /HYSTEROSCOPY;  Surgeon: Melony Overly;  Location: [facility];  Service: Gynecology;  Laterality: Bilateral;    History reviewed. No pertinent family history.  History  Substance Use Topics  . Smoking status: Never Smoker   . Smokeless tobacco: Not on file  . Alcohol Use: No    OB History    Grav Para Term Preterm Abortions TAB SAB Ect Mult Living                  Review of Systems  Constitutional: Positive for appetite change. Negative  for fever, diaphoresis and fatigue.  HENT: Positive for ear pain, congestion, sore throat, rhinorrhea, postnasal drip and sinus pressure. Negative for neck pain, neck stiffness and voice change.   Respiratory: Positive for cough. Negative for shortness of breath and wheezing.   Gastrointestinal: Positive for nausea. Negative for vomiting and abdominal pain.  Musculoskeletal: Negative for myalgias.  Skin: Negative for rash.  Neurological: Positive for headaches.    Allergies  Naproxen and Sulfa antibiotics  Home Medications   Current Outpatient Rx  Name Route Sig Dispense Refill  . ALPRAZOLAM 1 MG PO TABS Oral Take 1 mg by mouth 2 (two) times daily as needed. Anxiety      . AMOXICILLIN 500 MG PO CAPS Oral Take 1 capsule (500 mg total) by mouth 3 (three) times daily. 21 capsule 0  . HYDROCOD POLST-CPM POLST ER 10-8 MG/5ML PO LQCR Oral Take 5 mLs by mouth every 12 (twelve) hours. 115 mL 0  . VITAMIN B-12 IJ Injection Inject 1 each as directed every 30 (thirty) days.      . ERGOCALCIFEROL 50000 UNITS PO CAPS Oral Take 50,000 Units by mouth once a week.      Marland Kitchen FERROUS SULFATE 325 (65 FE) MG PO TABS Oral Take 325 mg by mouth daily with breakfast.      . FEXOFENADINE HCL 180 MG PO TABS Oral Take 180 mg by mouth daily as needed. allergies     .  FEXOFENADINE-PSEUDOEPHED ER 60-120 MG PO TB12 Oral Take 1 tablet by mouth every 12 (twelve) hours. 30 tablet 0  . FLUOXETINE HCL 20 MG PO CAPS Oral Take 20 mg by mouth daily.      . GUAIFENESIN-CODEINE 100-10 MG/5ML PO SYRP Oral Take 5 mLs by mouth 3 (three) times daily as needed for cough. 120 mL 0  . LANSOPRAZOLE 30 MG PO CPDR Oral Take 30 mg by mouth daily as needed. Acid reflux      . MULTI-VITAMIN/MINERALS PO TABS Oral Take 1 tablet by mouth daily.      . TOPIRAMATE 50 MG PO TABS Oral Take 50 mg by mouth 2 (two) times daily as needed. headaches       BP 104/68  Pulse 72  Temp(Src) 98.4 F (36.9 C) (Oral)  Resp 18  SpO2 100%  LMP  03/13/2012  Physical Exam  Nursing note and vitals reviewed. Constitutional: She appears well-developed and well-nourished.  HENT:  Head: Normocephalic.  Right Ear: Tympanic membrane normal.  Left Ear: Tympanic membrane normal.  Nose: Nose normal.  Mouth/Throat: Uvula is midline and oropharynx is clear and moist. No posterior oropharyngeal erythema.  Eyes: Conjunctivae are normal. Right eye exhibits no discharge. Left eye exhibits no discharge.  Neck: Neck supple.  Cardiovascular: Normal rate.   No murmur heard. Pulmonary/Chest: Effort normal and breath sounds normal. No respiratory distress. She has no decreased breath sounds. She has no wheezes. She has no rhonchi. She has no rales. She exhibits no tenderness.  Lymphadenopathy:    She has no cervical adenopathy.  Skin: No rash noted.    ED Course  Procedures (including critical care time)  Labs Reviewed - No data to display No results found.   1. Sinusitis       MDM  Sinusitis with productive cough- and post-nasal dripping, patient requesting specifically Tussirex (recently prescribed this in march). Afebrile, normal lung exam.        Jimmie Molly, MD 03/15/12 [phone]

## 2012-03-15 NOTE — Discharge Instructions (Signed)

## 2012-03-15 NOTE — ED Notes (Signed)
Reports onset last Saturday of sore throat, coughing, headache and dizziness.

## 2012-03-16 ENCOUNTER — Telehealth (HOSPITAL_COMMUNITY): Payer: Self-pay | Admitting: *Deleted

## 2012-03-16 NOTE — ED Notes (Signed)
Pt. called on VM @ 0914 and wants Diflucan. States she usually gets a yeast infection when on antibiotics. Also cough medicine is not working.  Pt. called back @ 1245 and I asked if she was symptomatic for a yeast infection and she said no.  I told her to call back if she develops symptoms.  I asked her about her cough. She said she coughed all night last night. I asked what the doctor gave her. She said Guaifenesin with Codeine. I told her that there wasn't anything stronger than that for a cough. I told her to take as directed. When she runs out she will need to use an OTC cough medicine. Pt. voiced understanding. Vassie Moselle 03/16/2012

## 2012-12-03 ENCOUNTER — Encounter (HOSPITAL_COMMUNITY): Payer: Self-pay | Admitting: *Deleted

## 2012-12-03 ENCOUNTER — Emergency Department (INDEPENDENT_AMBULATORY_CARE_PROVIDER_SITE_OTHER)
Admission: EM | Admit: 2012-12-03 | Discharge: 2012-12-03 | Disposition: A | Discharge status: Home / Self Care | Payer: 59 | Source: Home / Self Care | Attending: Family Medicine | Admitting: Family Medicine

## 2012-12-03 DIAGNOSIS — J069 Acute upper respiratory infection, unspecified: Secondary | ICD-10-CM

## 2012-12-03 MED ORDER — IPRATROPIUM BROMIDE 0.06 % NA SOLN: 2.0000 | Freq: Four times a day (QID) | NASAL | Status: DC

## 2012-12-03 NOTE — ED Notes (Signed)
Pt has  Symptoms  Of  sorethroat  As  Well as  A  Headache        With  Vomiting     X  2  Days   She    Reports  Son is  Ill  As   Well

## 2012-12-03 NOTE — ED Provider Notes (Signed)
History     CSN: [id number]  Arrival date & time 12/03/12  1478   First MD Initiated Contact with Patient 12/03/12 1827      Chief Complaint  Patient presents with  . Sore Throat    (Consider location/radiation/quality/duration/timing/severity/associated sxs/prior treatment) Patient is a 46 y.o. female presenting with pharyngitis. The history is provided by the patient.  Sore Throat This is a new problem. The current episode started 2 days ago. The problem has been gradually worsening. The symptoms are aggravated by swallowing.    Past Medical History  Diagnosis Date  . Heart murmur   . GERD (gastroesophageal reflux disease)   . Headache   . Anxiety   . Depression   . PONV (postoperative nausea and vomiting)     Past Surgical History  Procedure Date  . Gastric bypass 2002  . Cholecystectomy   . Tubal ligation   . Cesarean section   . Tonsillectomy   . Lasik   . Nose surgery   . Hysteroscopy w/d&c 10/14/2011    Procedure: DILATATION AND CURETTAGE (D&C) /HYSTEROSCOPY;  Surgeon: Melony Overly;  Location: [facility];  Service: Gynecology;  Laterality: Bilateral;    No family history on file.  History  Substance Use Topics  . Smoking status: Never Smoker   . Smokeless tobacco: Not on file  . Alcohol Use: No    OB History    Grav Para Term Preterm Abortions TAB SAB Ect Mult Living                  Review of Systems  Constitutional: Negative.   HENT: Positive for congestion, sore throat, rhinorrhea and postnasal drip.     Allergies  Naproxen and Sulfa antibiotics  Home Medications   Current Outpatient Rx  Name  Route  Sig  Dispense  Refill  . ALPRAZOLAM 1 MG PO TABS   Oral   Take 1 mg by mouth 2 (two) times daily as needed. Anxiety           . HYDROCOD POLST-CPM POLST ER 10-8 MG/5ML PO LQCR   Oral   Take 5 mLs by mouth every 12 (twelve) hours.   115 mL   0   . VITAMIN B-12 IJ   Injection   Inject 1 each as directed every 30 (thirty) days.          . ERGOCALCIFEROL 50000 UNITS PO CAPS   Oral   Take 50,000 Units by mouth once a week.           Marland Kitchen FERROUS SULFATE 325 (65 FE) MG PO TABS   Oral   Take 325 mg by mouth daily with breakfast.           . FEXOFENADINE HCL 180 MG PO TABS   Oral   Take 180 mg by mouth daily as needed. allergies          . FEXOFENADINE-PSEUDOEPHED ER 60-120 MG PO TB12   Oral   Take 1 tablet by mouth every 12 (twelve) hours.   30 tablet   0   . FLUOXETINE HCL 20 MG PO CAPS   Oral   Take 20 mg by mouth daily.           . IPRATROPIUM BROMIDE 0.06 % NA SOLN   Nasal   Place 2 sprays into the nose 4 (four) times daily.   15 mL   1   . LANSOPRAZOLE 30 MG PO CPDR   Oral   Take  30 mg by mouth daily as needed. Acid reflux           . MULTI-VITAMIN/MINERALS PO TABS   Oral   Take 1 tablet by mouth daily.           . TOPIRAMATE 50 MG PO TABS   Oral   Take 50 mg by mouth 2 (two) times daily as needed. headaches            BP 125/71  Pulse 74  Temp 99 F (37.2 C) (Oral)  Resp 18  SpO2 100%  Physical Exam  Nursing note and vitals reviewed. Constitutional: She is oriented to person, place, and time. She appears well-developed and well-nourished.  HENT:  Head: Normocephalic.  Right Ear: External ear normal.  Left Ear: External ear normal.  Nose: Nose normal.  Mouth/Throat: Oropharynx is clear and moist.  Eyes: Pupils are equal, round, and reactive to light.  Neck: Normal range of motion. Neck supple.  Pulmonary/Chest: Breath sounds normal.  Lymphadenopathy:    She has no cervical adenopathy.  Neurological: She is alert and oriented to person, place, and time.  Skin: Skin is warm and dry.    ED Course  Procedures (including critical care time)   Labs Reviewed  POCT RAPID STREP A ([facility] ONLY)   No results found.   1. URI (upper respiratory infection)       MDM          Linna Hoff, MD 12/03/12 2007

## 2013-01-07 ENCOUNTER — Other Ambulatory Visit: Payer: Self-pay | Admitting: Diagnostic Neuroimaging

## 2013-01-07 DIAGNOSIS — R51 Headache: Secondary | ICD-10-CM

## 2013-01-09 ENCOUNTER — Ambulatory Visit
Admission: RE | Admit: 2013-01-09 | Discharge: 2013-01-09 | Disposition: A | Discharge status: Home / Self Care | Payer: 59 | Source: Ambulatory Visit | Attending: Diagnostic Neuroimaging | Admitting: Diagnostic Neuroimaging

## 2013-01-09 DIAGNOSIS — R51 Headache: Secondary | ICD-10-CM

## 2013-03-03 ENCOUNTER — Emergency Department (HOSPITAL_COMMUNITY)
Admission: EM | Admit: 2013-03-03 | Discharge: 2013-03-03 | Disposition: A | Discharge status: Home / Self Care | Payer: 59 | Attending: Emergency Medicine | Admitting: Emergency Medicine

## 2013-03-03 ENCOUNTER — Encounter (HOSPITAL_COMMUNITY): Payer: Self-pay | Admitting: Emergency Medicine

## 2013-03-03 DIAGNOSIS — R011 Cardiac murmur, unspecified: Secondary | ICD-10-CM | POA: Insufficient documentation

## 2013-03-03 DIAGNOSIS — J329 Chronic sinusitis, unspecified: Secondary | ICD-10-CM | POA: Insufficient documentation

## 2013-03-03 DIAGNOSIS — Z79899 Other long term (current) drug therapy: Secondary | ICD-10-CM | POA: Insufficient documentation

## 2013-03-03 DIAGNOSIS — R51 Headache: Secondary | ICD-10-CM

## 2013-03-03 DIAGNOSIS — F411 Generalized anxiety disorder: Secondary | ICD-10-CM | POA: Insufficient documentation

## 2013-03-03 DIAGNOSIS — F329 Major depressive disorder, single episode, unspecified: Secondary | ICD-10-CM | POA: Insufficient documentation

## 2013-03-03 DIAGNOSIS — K219 Gastro-esophageal reflux disease without esophagitis: Secondary | ICD-10-CM | POA: Insufficient documentation

## 2013-03-03 DIAGNOSIS — F3289 Other specified depressive episodes: Secondary | ICD-10-CM | POA: Insufficient documentation

## 2013-03-03 MED ORDER — VALPROATE SODIUM 500 MG/5ML IV SOLN
1000.0000 mg | Freq: Once | INTRAVENOUS | Status: AC
  Administered 2013-03-03: 1000 mg via INTRAVENOUS
  Filled 2013-03-03: qty 10

## 2013-03-03 MED ORDER — CLARITHROMYCIN 500 MG PO TABS: 500.0000 mg | ORAL_TABLET | Freq: Two times a day (BID) | ORAL | Status: DC

## 2013-03-03 NOTE — ED Provider Notes (Signed)
History     CSN: [id number]  Arrival date & time 03/03/13  0916   First MD Initiated Contact with Patient 03/03/13 1200      Chief Complaint  Patient presents with  . Migraine    (Consider location/radiation/quality/duration/timing/severity/associated sxs/prior treatment) HPI Comments: Megan Davila is a 47 y.o. female who states, that she's had a persistent headache for 4 days. She is using her usual medications, without relief. She states this feels migraine and that typically this type of pain requires intravenous medications to improve. She has had relief in the past with Depakote IV. She also has noticed nasal discharge, postnasal drip, cough, sneezing, and ear pain for several days. She denies fever, chills, nausea, vomiting, neck or back pain. No other known modifying factors  Patient is a 47 y.o. female presenting with migraines. The history is provided by the patient.  Migraine    Past Medical History  Diagnosis Date  . Heart murmur   . GERD (gastroesophageal reflux disease)   . Headache   . Anxiety   . Depression   . PONV (postoperative nausea and vomiting)     Past Surgical History  Procedure Laterality Date  . Gastric bypass  2002  . Cholecystectomy    . Tubal ligation    . Cesarean section    . Tonsillectomy    . Lasik    . Nose surgery    . Hysteroscopy w/d&c  10/14/2011    Procedure: DILATATION AND CURETTAGE (D&C) /HYSTEROSCOPY;  Surgeon: Melony Overly;  Location: [facility];  Service: Gynecology;  Laterality: Bilateral;    No family history on file.  History  Substance Use Topics  . Smoking status: Never Smoker   . Smokeless tobacco: Not on file  . Alcohol Use: No    OB History   Grav Para Term Preterm Abortions TAB SAB Ect Mult Living                  Review of Systems  All other systems reviewed and are negative.    Allergies  Naproxen and Sulfa antibiotics  Home Medications   Current Outpatient Rx  Name  Route  Sig  Dispense   Refill  . ALPRAZolam (XANAX) 1 MG tablet   Oral   Take 1 mg by mouth 2 (two) times daily as needed. Anxiety           . divalproex (DEPAKOTE) 250 MG DR tablet   Oral   Take 250 mg by mouth at bedtime.         . ergocalciferol (VITAMIN D2) 50000 UNITS capsule   Oral   Take 50,000 Units by mouth once a week.           . ferrous sulfate 325 (65 FE) MG tablet   Oral   Take 325 mg by mouth daily with breakfast.           . fexofenadine (ALLEGRA) 180 MG tablet   Oral   Take 180 mg by mouth daily as needed. allergies          . fexofenadine-pseudoephedrine (ALLEGRA-D) 60-120 MG per tablet   Oral   Take 1 tablet by mouth every 12 (twelve) hours.   30 tablet   0   . lansoprazole (PREVACID) 30 MG capsule   Oral   Take 30 mg by mouth daily as needed. Acid reflux           . Multiple Vitamins-Minerals (MULTIVITAMIN WITH MINERALS) tablet   Oral  Take 1 tablet by mouth daily.           . rizatriptan (MAXALT) 10 MG tablet   Oral   Take 10 mg by mouth as needed for migraine. May repeat in 2 hours if needed         . topiramate (TOPAMAX) 50 MG tablet   Oral   Take 50-100 mg by mouth 2 (two) times daily. 50 mg in the morning and 100 mg at night         . venlafaxine XR (EFFEXOR-XR) 150 MG 24 hr capsule   Oral   Take 150 mg by mouth daily.         Marland Kitchen zolmitriptan (ZOMIG-ZMT) 5 MG disintegrating tablet   Oral   Take 5 mg by mouth as needed for migraine (Take one tablet at headache on-set. May repeat in 2 hours if no relief.).         Marland Kitchen clarithromycin (BIAXIN) 500 MG tablet   Oral   Take 1 tablet (500 mg total) by mouth 2 (two) times daily.   20 tablet   0   . Cyanocobalamin (VITAMIN B-12 IJ)   Injection   Inject 1 each as directed every 30 (thirty) days.             BP 127/81  Pulse 71  Temp(Src) 97.6 F (36.4 C) (Oral)  Resp 16  SpO2 94%  Physical Exam  Nursing note and vitals reviewed. Constitutional: She is oriented to person, place,  and time. She appears well-developed and well-nourished.  HENT:  Head: Normocephalic and atraumatic.  Mild tenderness to percussion over the bilateral maxillary sinuses  Eyes: Conjunctivae and EOM are normal. Pupils are equal, round, and reactive to light.  Neck: Normal range of motion and phonation normal. Neck supple.  Cardiovascular: Normal rate, regular rhythm and intact distal pulses.   Pulmonary/Chest: Effort normal and breath sounds normal. She exhibits no tenderness.  Abdominal: Soft. She exhibits no distension. There is no tenderness. There is no guarding.  Musculoskeletal: Normal range of motion.  Neurological: She is alert and oriented to person, place, and time. She has normal strength. She exhibits normal muscle tone.  Skin: Skin is warm and dry.  Psychiatric: Her behavior is normal. Judgment and thought content normal.  Anxious    ED Course  Procedures (including critical care time)   Medications  valproate (DEPACON) 1,000 mg in dextrose 5 % 50 mL IVPB (0 mg Intravenous Stopped 03/03/13 1355)    Resolution of HA at d/c   Nursing Notes Reviewed/ Care Coordinated Applicable Imaging Reviewed Interpretation of Laboratory Data incorporated into ED treatment   1. Headache   2. Sinusitis    Plan: Home Medications- Biaxin; Home Treatments- rest; Recommended follow up- PCP prn   MDM  Usual Migraine, possibly precipitated by sinusitis. Doubt metabolic instability, serious bacterial infection or impending vascular collapse; the patient is stable for discharge.    Flint Melter, MD 03/04/13 [phone]

## 2013-03-03 NOTE — ED Notes (Signed)
Pt c/o seeing "floaters" and tinnitus which she states is normal for her migraines.

## 2013-03-03 NOTE — ED Notes (Signed)
C/o migraine headache x 4 days.  Taking meds without relief.  Also reports sinus pressure x 3 days.

## 2013-04-18 ENCOUNTER — Other Ambulatory Visit: Payer: Self-pay

## 2013-04-18 MED ORDER — TOPIRAMATE 50 MG PO TABS: ORAL_TABLET | ORAL | Status: DC

## 2013-04-19 ENCOUNTER — Telehealth: Payer: Self-pay | Admitting: Diagnostic Neuroimaging

## 2013-04-19 ENCOUNTER — Ambulatory Visit (INDEPENDENT_AMBULATORY_CARE_PROVIDER_SITE_OTHER): Payer: 59 | Admitting: *Deleted

## 2013-04-19 DIAGNOSIS — G43109 Migraine with aura, not intractable, without status migrainosus: Secondary | ICD-10-CM

## 2013-04-19 MED ORDER — VALPROATE SODIUM 500 MG/5ML IV SOLN
1000.0000 mg | INTRAVENOUS | Status: DC
  Administered 2013-04-19: 1000 mg via INTRAVENOUS

## 2013-04-19 NOTE — Patient Instructions (Signed)
Pt instructed to call back as needed.

## 2013-04-19 NOTE — Telephone Encounter (Signed)
Pt calling for migraine , level 10, has taken zomig and no relief.  Would like to come in for depacon infusion.   Dr. Marjory Lies asked and ok with pt to have depacon infusion. 1000mg  IV x 1.

## 2013-04-19 NOTE — Progress Notes (Signed)
Pt here for Level 10 migraine.  Under aseptic technique 24g IV angiocath inserted to L AC with good blood return.  Taped securely to L AC.  Infusion started at 1612, pt made comfortable reclining in chair, lights dimmed.  She had taken maxalt yesterday and then zomig today without any relief.   Has had migraine for 3 days.  (pain behind eyes along with tinnitus).  Depacon 1000mg  IV finished at 1632.  Angiocath removed, bandage applied.  Pt to call back if problems.   No level change.

## 2013-04-19 NOTE — Telephone Encounter (Signed)
Pt to come at 1600 for IV depacon.

## 2013-05-10 ENCOUNTER — Telehealth: Payer: Self-pay | Admitting: Diagnostic Neuroimaging

## 2013-05-10 NOTE — Telephone Encounter (Signed)
Patient called stating she is having a headache and Maxalt has been called into her pharmacy, but she needs somethig for nausea. Patient also stated she is having ringing in the ear.

## 2013-05-12 ENCOUNTER — Other Ambulatory Visit: Payer: Self-pay

## 2013-05-12 MED ORDER — DIVALPROEX SODIUM 250 MG PO DR TAB: 250.0000 mg | DELAYED_RELEASE_TABLET | Freq: Every day | ORAL | Status: DC

## 2013-05-15 ENCOUNTER — Ambulatory Visit: Payer: Self-pay | Admitting: Diagnostic Neuroimaging

## 2013-05-31 NOTE — Telephone Encounter (Signed)
Called patient multiple times. No answer. -VRP

## 2013-08-20 ENCOUNTER — Other Ambulatory Visit: Payer: Self-pay

## 2013-08-20 DIAGNOSIS — Z1231 Encounter for screening mammogram for malignant neoplasm of breast: Secondary | ICD-10-CM

## 2013-09-04 ENCOUNTER — Ambulatory Visit: Payer: 59 | Admitting: Obstetrics and Gynecology

## 2013-09-09 ENCOUNTER — Ambulatory Visit
Admission: RE | Admit: 2013-09-09 | Discharge: 2013-09-09 | Disposition: A | Discharge status: Home / Self Care | Payer: 59 | Source: Ambulatory Visit

## 2013-09-09 DIAGNOSIS — Z1231 Encounter for screening mammogram for malignant neoplasm of breast: Secondary | ICD-10-CM

## 2013-09-12 LAB — HM MAMMOGRAPHY

## 2013-10-07 ENCOUNTER — Encounter: Payer: Self-pay | Admitting: Obstetrics and Gynecology

## 2013-10-07 ENCOUNTER — Ambulatory Visit (INDEPENDENT_AMBULATORY_CARE_PROVIDER_SITE_OTHER): Payer: 59 | Admitting: Obstetrics and Gynecology

## 2013-10-07 VITALS — BP 100/60 | HR 70 | Ht 63.5 in | Wt 130.5 lb

## 2013-10-07 DIAGNOSIS — Z0000 Encounter for general adult medical examination without abnormal findings: Secondary | ICD-10-CM

## 2013-10-07 DIAGNOSIS — Z01419 Encounter for gynecological examination (general) (routine) without abnormal findings: Secondary | ICD-10-CM

## 2013-10-07 DIAGNOSIS — Z23 Encounter for immunization: Secondary | ICD-10-CM

## 2013-10-07 LAB — POCT URINALYSIS DIPSTICK
Bilirubin, UA: NEGATIVE
Blood, UA: NEGATIVE
Glucose, UA: NEGATIVE
Leukocytes, UA: NEGATIVE
Nitrite, UA: NEGATIVE
Urobilinogen, UA: NEGATIVE
pH, UA: 5

## 2013-10-07 NOTE — Progress Notes (Signed)
Patient ID: Megan Davila, female   DOB: September 15, 1966, 47 y.o.   MRN: [id number] GYNECOLOGY VISIT  PCP:  Merri Brunette, MD  Referring provider:   HPI: 47 y.o.   Divorced  Caucasian  female   G1P0 with Patient's last menstrual period was 09/28/2013.   here for   AEX. Gaining weight.  Was down to 114 pounds.  Menses irregular.  Can occur every month or skip a couple of months.  When has bleeding can last 3 - 5 days.  Brown discharge.  No pain.  Still has hot flashes sometimes. Night sweats have improved.   Patient is now a single mother.  Personal, professional, financial stresses.    Hgb:    PCP Urine:  Neg  GYNECOLOGIC HISTORY: Patient's last menstrual period was 09/28/2013. 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   OB History   Grav Para Term Preterm Abortions TAB SAB Ect Mult Living   1         1       LIFESTYLE: Exercise:      no         Tobacco:     no Alcohol:        1-2 drinks per month Drug use:     no  OTHER HEALTH MAINTENANCE: Tetanus/TDap:    Unsure,  If had it during pregnancy.  Gardisil:  NA Influenza:    09/2013 Zostavax:  NA  Bone density:  never Colonoscopy:  never  Cholesterol check:   PCP--wnl   Family History  Problem Relation Age of Onset  . Diabetes Mother   . Heart disease Father   . Social phobia Father   . Psychiatric Illness Father   . Hypertension Father   . Stroke Father   . Heart disease    . Diabetes    . Stroke    . Hypertension Maternal Grandmother   . Thyroid disease Maternal Grandmother   . Hypertension Paternal Grandmother   .  Stroke Paternal Grandfather     There are no active problems to display for this patient.  Past Medical History  Diagnosis Date  . Heart murmur   . GERD (gastroesophageal reflux disease)   . Headache(784.0)   . Anxiety   . Depression   . PONV (postoperative nausea and vomiting)   . Anemia   . History of kidney stones     Past Surgical History  Procedure Laterality Date  . Gastric bypass  2002  . Cholecystectomy    . Tubal ligation    . Cesarean section    . Tonsillectomy    . Lasik    . Nose surgery    . Hysteroscopy w/d&c  10/14/2011    Procedure: DILATATION AND CURETTAGE (D&C) /HYSTEROSCOPY;  Surgeon: Melony Overly;  Location: [facility];  Service: Gynecology;  Laterality: Bilateral;  . Tonsillectomy      ALLERGIES: Naproxen and Sulfa antibiotics  Current Outpatient Prescriptions  Medication Sig Dispense Refill  . ALPRAZolam (XANAX) 1 MG tablet  Take 1 mg by mouth 2 (two) times daily as needed. Anxiety        . Cyanocobalamin (VITAMIN B-12 IJ) Inject 1 each as directed every 30 (thirty) days.        . divalproex (DEPAKOTE) 250 MG DR tablet Take 1 tablet (250 mg total) by mouth at bedtime.  90 tablet  1  . ergocalciferol (VITAMIN D2) 50000 UNITS capsule Take 50,000 Units by mouth once a week.        . ferrous sulfate 325 (65 FE) MG tablet Take 325 mg by mouth daily with breakfast.        . fexofenadine (ALLEGRA) 180 MG tablet Take 180 mg by mouth daily as needed. allergies       . lansoprazole (PREVACID) 30 MG capsule Take 30 mg by mouth daily as needed. Acid reflux        . Multiple Vitamins-Minerals (MULTIVITAMIN WITH MINERALS) tablet Take 1 tablet by mouth daily.        . rizatriptan (MAXALT) 10 MG tablet Take 10 mg by mouth as needed for migraine. May repeat in 2 hours if needed      . topiramate (TOPAMAX) 50 MG tablet 50 mg in the morning and 100 mg at night  90 tablet  6  . venlafaxine XR (EFFEXOR-XR) 150 MG 24 hr capsule Take 150 mg by mouth daily.      Marland Kitchen  zolmitriptan (ZOMIG-ZMT) 5 MG disintegrating tablet Take 5 mg by mouth as needed for migraine (Take one tablet at headache on-set. May repeat in 2 hours if no relief.).       Current Facility-Administered Medications  Medication Dose Route Frequency Provider Last Rate Last Dose  . valproate (DEPACON) 1,000 mg in sodium chloride 0.9 % 100 mL IVPB  1,000 mg Intravenous Continuous Suanne Marker, MD   1,000 mg at 04/19/13 1626     ROS:  Pertinent items are noted in HPI.  SOCIAL HISTORY:  Clinical biochemist.  PHYSICAL EXAMINATION:    BP 100/60  Pulse 70  Ht 5' 3.5" (1.613 m)  Wt 130 lb 8 oz (59.194 kg)  BMI 22.75 kg/m2  LMP 09/28/2013   Wt Readings from Last 3 Encounters:  10/07/13 130 lb 8 oz (59.194 kg)  10/06/11 131 lb (59.421 kg)     Ht Readings from Last 3 Encounters:  10/07/13 5' 3.5" (1.613 m)  10/06/11 5' 3.5" (1.613 m)    General appearance: alert, cooperative and appears stated age Head: Normocephalic, without obvious abnormality, atraumatic Neck: no adenopathy, supple, symmetrical, trachea midline and thyroid not enlarged, symmetric, no tenderness/mass/nodules Lungs: clear to auscultation bilaterally Breasts: Inspection negative, No nipple retraction or dimpling, No nipple discharge or bleeding, No axillary or supraclavicular adenopathy, Normal to palpation without dominant masses Heart: regular rate and rhythm Abdomen: soft, non-tender; no masses,  no organomegaly Extremities: extremities normal, atraumatic, no cyanosis or edema Skin: Skin color, texture, turgor normal. No rashes or lesions Lymph nodes: Cervical, supraclavicular, and axillary nodes normal. No abnormal inguinal nodes palpated Neurologic: Grossly normal  Pelvic: External genitalia:  no lesions              Urethra:  normal appearing urethra with no masses, tenderness or lesions              Bartholins and Skenes: normal                 Vagina: normal appearing vagina with normal color and  discharge, no lesions  Cervix: normal appearance              Pap and high risk HPV testing done: yes.            Bimanual Exam:  Uterus:  uterus is normal size, shape, consistency and nontender                                      Adnexa: normal adnexa in size, nontender and no masses                                      Rectovaginal: Confirms                                      Anus:  normal sphincter tone, no lesions  ASSESSMENT  Normal gynecologic exam. History of CIN 3. Perimenopausal female.  PLAN  Mammogram yearly.  Pap smear and high risk HPV testing Counseled to ask for professional assistance regarding legal, personal, and financial issues in her life.  TDap.  Return annually or prn   An After Visit Summary was printed and given to the patient.

## 2013-10-07 NOTE — Patient Instructions (Signed)

## 2013-10-15 ENCOUNTER — Telehealth: Payer: Self-pay | Admitting: *Deleted

## 2013-10-15 NOTE — Telephone Encounter (Signed)
Spoke to patient regarding abnormal pa and recommendation for colpo for further evaluation. Patient has had this before, very hesitant during phone call, she states she is with father who is having cardiac testing done.  She will call back when she can talk.

## 2013-10-15 NOTE — Telephone Encounter (Signed)
Message copied by Alisa Graff on Tue Oct 15, 2013 10:01 AM ------      Message from: Conley Simmonds      Created: Mon Oct 14, 2013  4:21 PM       French Ana,            Please contact the patient regarding her abnormal pap showing ASCUS and positive high risk HPV.      I recommend a colposcopy with me before the end of the year.        Patient has a history of a conization of her cervix.             Thank you,            Conley Simmonds ------

## 2013-10-15 NOTE — Telephone Encounter (Signed)
Message copied by Alisa Graff on Tue Oct 15, 2013 10:20 AM ------      Message from: Conley Simmonds      Created: Mon Oct 14, 2013  4:21 PM       French Ana,            Please contact the patient regarding her abnormal pap showing ASCUS and positive high risk HPV.      I recommend a colposcopy with me before the end of the year.        Patient has a history of a conization of her cervix.             Thank you,            Conley Simmonds ------

## 2013-10-15 NOTE — Telephone Encounter (Signed)
Patient called back.  Very anxious about abnormal results. Advised pap only showing atypical cells at this point and colposcopy recommended for further evaluation.  Discussed that this is still the evaluation stage and not the CKC procedure at the hospital.  Dr Edward Jolly will review with her further at appointment.  Explained colpo procedure, patient has had BTSP and menses due at end of month.  Colpo scheduled for 10-17-13 ay 2 pm with Dr Edward Jolly.  Patient is allergic to naproxen so advised to take Tylenol and eat prior to appointment. Routing to provider for final review. Patient agreeable to disposition. Will close encounter

## 2013-10-15 NOTE — Addendum Note (Signed)
Addended by: Alisa Graff on: 10/15/2013 10:39 AM   Modules accepted: Orders

## 2013-10-16 ENCOUNTER — Other Ambulatory Visit: Payer: Self-pay | Admitting: Obstetrics and Gynecology

## 2013-10-16 DIAGNOSIS — IMO0002 Reserved for concepts with insufficient information to code with codable children: Secondary | ICD-10-CM

## 2013-10-17 ENCOUNTER — Telehealth: Payer: Self-pay | Admitting: Obstetrics and Gynecology

## 2013-10-17 ENCOUNTER — Encounter: Payer: Self-pay | Admitting: Obstetrics and Gynecology

## 2013-10-17 ENCOUNTER — Ambulatory Visit: Payer: Self-pay | Admitting: Obstetrics and Gynecology

## 2013-10-17 ENCOUNTER — Ambulatory Visit: Payer: Self-pay

## 2013-10-17 ENCOUNTER — Ambulatory Visit (INDEPENDENT_AMBULATORY_CARE_PROVIDER_SITE_OTHER): Payer: 59 | Admitting: Obstetrics and Gynecology

## 2013-10-17 VITALS — BP 110/60 | HR 80 | Ht 63.5 in | Wt 130.0 lb

## 2013-10-17 DIAGNOSIS — R6889 Other general symptoms and signs: Secondary | ICD-10-CM

## 2013-10-17 NOTE — Patient Instructions (Signed)
You will have light bleeding for the next 1 - 2 days.

## 2013-10-17 NOTE — Telephone Encounter (Signed)
Follow up scheduled for 10-24-13 at 730am. Patient agreeable. Routing to provider for final review. Patient agreeable to disposition. Will close encounter

## 2013-10-17 NOTE — Telephone Encounter (Signed)
Patient needs to come in for a 8 day reck with Dr. Edward Jolly. (no appointment available)

## 2013-10-17 NOTE — Progress Notes (Addendum)
Subjective:     Patient ID: Megan Davila, female   DOB: 1966/07/18, 47 y.o.   MRN: [id number]  HPI  LMP 09/28/13. Patient is here for colposcopy. Pap showed ASCUS and positive HR HPV. History of conization for CIN III 20 years ago.  Recurrent CIN was then treated with Yag.  Patient's father has congestive heart failure. Patient under personal stresses with finances and the father of her child.   Review of Systems  See HPI.     Objective:   Physical Exam  Genitourinary:        Procedure - Colposcopy Consent obtained.  Speculum placed.  Acetic acid placed in vagina.  Attempt to use endocervical speculum to visualize the squamocolumnar junction, not possible.  Colposcopy of cervix unsatisfactory.  No lesions noted of the cervix or the vagina. A cytobrush was used to obtain endocervical curettings, which were sent to pathology.    Colposcopy of the vulva performed with acetic acid.  No lesions noted.   No complications.     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 Status post conization of the cervix and laser therapy for recurrent dysplasia. Unsatisfactory colposcopy.    Plan:     Follow up ECC result. Patient will return in one week to discuss results and recommendations.

## 2013-10-24 ENCOUNTER — Encounter: Payer: Self-pay | Admitting: Obstetrics and Gynecology

## 2013-10-24 ENCOUNTER — Ambulatory Visit (INDEPENDENT_AMBULATORY_CARE_PROVIDER_SITE_OTHER): Payer: 59 | Admitting: Obstetrics and Gynecology

## 2013-10-24 VITALS — BP 110/70 | HR 70 | Ht 63.75 in | Wt 126.5 lb

## 2013-10-24 DIAGNOSIS — R87610 Atypical squamous cells of undetermined significance on cytologic smear of cervix (ASC-US): Secondary | ICD-10-CM

## 2013-10-24 DIAGNOSIS — R87810 Cervical high risk human papillomavirus (HPV) DNA test positive: Secondary | ICD-10-CM

## 2013-10-24 NOTE — Patient Instructions (Signed)
Loop Electrosurgical Excision Procedure  Loop electrosurgical excision procedure (LEEP) is the removal of a portion of the lower part of the uterus (cervix). The procedure is done when there are significantly abnormal cervical cell changes. Abnormal cell changes of the cervix can lead to cancer if left in place and untreated.     The LEEP procedure itself typically only takes a few minutes. Often, it may be done in your caregiver's office. The procedure is considered safe for those who wish to get pregnant or are trying to get pregnant. Only under rare circumstances should this procedure be done if you are pregnant.  LET YOUR CAREGIVER KNOW ABOUT:  · Whether you are pregnant or late for your last menstrual period.  · Allergies to foods or medicines.  · All the medicines you are taking including herbs, eyedrops, and over-the-counter medicines, and creams.  · Use of steroids (by mouth or creams).  · Previous problems with anesthetics or numbing medicine.  · Previous gynecological surgery.  · History of blood clots or bleeding problems.  · Any recent or current vaginal infections (herpes, sexually transmitted infections).  · Other health problems.  RISKS AND COMPLICATIONS  · Bleeding.  · Infection.  · Injury to the vagina, bladder, or rectum.  · Very rare obstruction of the cervical opening that causes problems during menstruation (cervical stenosis).  BEFORE THE PROCEDURE  · Do not take aspirin or blood thinners (anticoagulants) for 1 week before the procedure, or as told by your caregiver.  · Eat a light meal before the procedure.  · Ask your caregiver about changing or stopping your regular medicines.  · You may be given a pain reliever 1 or 2 hours before the procedure.  PROCEDURE   · A tool (speculum) is placed in the vagina. This allows your caregiver to see the cervix.  · An iodine stain is applied to the cervix to find the area of abnormal cells to be removed.  · Medicine is injected to numb the cervix (local  anesthetic).    · Electricity is passed through a thin wire loop which is then used to remove (cauterize) a small segment of the affected cervix.  · Light electrocautery is used to seal any small blood vessels and prevent bleeding.  · A paste may be applied to the cauterized area of the cervix to help prevent bleeding.  · The tissue sample is sent to the lab. It is examined under the microscope.  AFTER THE PROCEDURE  · Have someone drive you home.  · You may have slight to moderate cramping.  · You may notice a black vaginal discharge from the paste used on the cervix to prevent bleeding. This is normal.  · Watch for excessive bleeding. This requires immediate medical care.  · Ask when your test results will be ready. Make sure you get your test results.  Document Released: 02/11/2003 Document Revised: 02/13/2012 Document Reviewed: 05/03/2011  [employer]® Patient Information ©2014 [employer].

## 2013-10-24 NOTE — Progress Notes (Signed)
Patient ID: Megan Davila, female   DOB: May 12, 1966, 47 y.o.   MRN: [id number]  Subjective  Patient is here to discuss colposcopy results. History of prior conization and laser to cervix for dysplasia.  Patient concerned about losing her job. Has financial stress at home. FOB now not involved. Has a little boy to take vcare of.   Objective  Pap - ASCUS and positive high risk HPV. Colposcopy - unsatisfactory, ECC benign but with a fragment of squamous cells with HPV effect.  Assessment  ASCUS and positive high risk HPV. Unsatisfactory colposcopy. History of prior cervical dysplasia.  Plan  Proceed with in office LEEP procedure.  Will precert. I discussed risks, benefits, and alternatives.  I discussed risk of bleeding, infection, cervical stenosis making future evaluation more difficult, possible negative pathology, and possible positive margins necessitating future treatments. We briefly discussed hysterectomy which I do not recommend for the patient at this time.   20 minutes face to face time of which over 50% was spent in counseling.

## 2013-11-04 HISTORY — PX: CERVICAL BIOPSY  W/ LOOP ELECTRODE EXCISION: SUR135

## 2013-11-08 ENCOUNTER — Telehealth: Payer: Self-pay | Admitting: *Deleted

## 2013-11-08 NOTE — Telephone Encounter (Signed)
Call to patient to schedule LEEP procedure.    Has had BTSP.  LMP end of November but are unpredictable.  Briefly discussed "Limited activity" day of procedure since she mentioned returning to work post procedure.  I advised her this would not be recommended, should have low key day, not confined to bed but ability to take it easy. Recommend Motrin with food prior to procedure but is allergic to Naproxen.  Advised ok to take Tylenol instead of Motrin.  Requesting latest possible appointment in the day. Computer went down while scheduling so patient notified will confirm with MD and call her back Monday.  Agree? Any other instructions?

## 2013-11-10 NOTE — Telephone Encounter (Signed)
I agree with your instructions.  I believe the patient is not sexually active, so I can do the LEEP as long as she is not on her cycle.

## 2013-11-11 NOTE — Telephone Encounter (Signed)
Spoke with patient and appointment scheduled for 12/11 at 1:30. Does that look okay to you?

## 2013-11-11 NOTE — Telephone Encounter (Signed)
Megan Davila, This patient's call came in Friday when computer went down.  Can you please call her and see if you can get her scheduled.  We can offer her 12-31 around 1pm.  She wants a late PM but Edward Jolly doesn't have anything really late.  If you cant find a spot, just let her know I am working on it and can call her tomorrow.  I just dont want her to think I forgot her.

## 2013-11-13 NOTE — Telephone Encounter (Signed)
Dr Edward Jolly, I think this looks ok.  We will not schedule in the blocked slots after it. Is this workable for you.

## 2013-11-13 NOTE — Telephone Encounter (Signed)
OK 

## 2013-11-14 ENCOUNTER — Ambulatory Visit (INDEPENDENT_AMBULATORY_CARE_PROVIDER_SITE_OTHER): Payer: 59 | Admitting: Obstetrics and Gynecology

## 2013-11-14 ENCOUNTER — Encounter: Payer: Self-pay | Admitting: Obstetrics and Gynecology

## 2013-11-14 VITALS — BP 110/60 | HR 88 | Resp 18 | Ht 63.75 in | Wt 131.5 lb

## 2013-11-14 DIAGNOSIS — R87810 Cervical high risk human papillomavirus (HPV) DNA test positive: Secondary | ICD-10-CM

## 2013-11-14 DIAGNOSIS — R87610 Atypical squamous cells of undetermined significance on cytologic smear of cervix (ASC-US): Secondary | ICD-10-CM

## 2013-11-14 MED ORDER — OXYCODONE-ACETAMINOPHEN 5-325 MG PO TABS: 1.0000 | ORAL_TABLET | ORAL | Status: DC | PRN

## 2013-11-14 NOTE — Patient Instructions (Signed)

## 2013-11-14 NOTE — Progress Notes (Signed)
Patient ID: Megan Davila, female   DOB: 1966-09-22, 47 y.o.   MRN: [id number]  47 y.o. DivorcedNot Hispanic or Latino  female  OB History   Grav Para Term Preterm Abortions TAB SAB Ect Mult Living   1         1     here for LEEP.    Pap History:ASCUS with POSITIVE high risk HPV on Pap done October 07, 2013. Colposcopy performed on 10/17/13 was unsatisfactory and ECC showed benign endocervical tissue and a a fragment of squamous tissue showing HPV effect.  History of prior conization of the cervix 20 years ago for CIN III.  Had Yag laser treatment for residual dysplasia.  Patient's last menstrual period was 10/28/2013. bilateral tubal ligation  Pre-procedure vitals: Blood pressure 110/60, pulse 88, resp. rate 18, height 5' 3.75" (1.619 m), weight 131 lb 8 oz (59.648 kg), last menstrual period 10/28/2013.   Procedure explained and patient's questions were invited and answered.   Consent form signed.  Procedure Set-up: Grounding pad located right thigh.  Cautery settings:40 cut/60coagulation.  Suction applied to coated speculum.  Procedure:  Speculum placed with good visualization of the cervix.  Colposcopy performed showing:  no visible lesions.  Sterile prep of cervix with Hibiclens.  Cervix anesthetized using 2% Xylocaine with 1:100,000units Epinephrine.  10 cc's used.  Lugol's solution placed on cervix.  Entire transition zone with medium loop in 1 pass.  Endocervical tissue removed with small loop in 1 pass. Specimen(s) placed on cork and labeled for pathology.  The LEEP and the endocervix were each marked with a pin at 12 o'clock and sent to pathology separately.  Hemostasis obtained with ball cautery and Monsel's solution.  EBL:  Minimal  Complications:none  Patient tolerated procedure well and left the office in satisfactory condition.  Assessment  ASCUS pap and positive high risk HPV. Unsatisfactory colposcopy. History of prior conization of cervix.   Plan   Patient will  call for path report in my absence from the office next week. I will contact her the following week to discuss the results further. Repeat pap planned in 6 months anticipated.   An after visit summary to the patient.

## 2013-11-15 ENCOUNTER — Other Ambulatory Visit: Payer: Self-pay | Admitting: Diagnostic Neuroimaging

## 2013-12-12 ENCOUNTER — Encounter: Payer: Self-pay | Admitting: Obstetrics and Gynecology

## 2013-12-12 ENCOUNTER — Ambulatory Visit (INDEPENDENT_AMBULATORY_CARE_PROVIDER_SITE_OTHER): Payer: 59 | Admitting: Obstetrics and Gynecology

## 2013-12-12 VITALS — BP 100/60 | HR 70 | Ht 63.75 in | Wt 131.5 lb

## 2013-12-12 DIAGNOSIS — IMO0002 Reserved for concepts with insufficient information to code with codable children: Secondary | ICD-10-CM

## 2013-12-12 DIAGNOSIS — N760 Acute vaginitis: Secondary | ICD-10-CM

## 2013-12-12 DIAGNOSIS — B9689 Other specified bacterial agents as the cause of diseases classified elsewhere: Secondary | ICD-10-CM

## 2013-12-12 DIAGNOSIS — R6889 Other general symptoms and signs: Secondary | ICD-10-CM

## 2013-12-12 DIAGNOSIS — A499 Bacterial infection, unspecified: Secondary | ICD-10-CM

## 2013-12-12 MED ORDER — CLINDAMYCIN PHOSPHATE 2 % VA CREA: 1.0000 | TOPICAL_CREAM | Freq: Every day | VAGINAL | Status: DC

## 2013-12-12 NOTE — Progress Notes (Signed)
Patient ID: Megan Davila, female   DOB: 12-Dec-1965, 48 y.o.   MRN: [id number]  Subjective  Patient is her for a follow up to LEEP performed on 11/14/13. Notes vaginal odor.  Objective  BP 100/60   P 70  Pelvic  Normal external genitalia and urethra. Cervix well healed.  Slight spotting with contact with speculum. Uterus small and nontender. No adnexal masses or tenderness.  Pathology - LGSIL in LEEP and endocervical button.  Margins negative  Wet prep - pH 5.5, positive for a few clue cells, negative yeast and negative trichomonas.  Assessment  Status post LEEP LGSIL Bacterial vaginosis.  Plan  Follow up pap in 6 months. Clindamycin 2% gel in vagina at hs for 7 nights.  15 minutes face to face time of which over 50% was spent in counseling.

## 2013-12-12 NOTE — Patient Instructions (Addendum)
Clindamycin vaginal cream What is this medicine? CLINDAMYCIN (Luxora sin) is a lincosamide antibiotic. It is used to treat vaginal infections caused by certain bacteria. This medicine may be used for other purposes; ask your health care provider or pharmacist if you have questions. COMMON BRAND NAME(S): Cleocin, ClindaMax, Clindesse What should I tell my health care provider before I take this medicine? They need to know if you have any of these conditions: -diarrhea -inflammatory bowel disease -kidney or liver disease -stomach problems like colitis -an unusual or allergic reaction to clindamycin, lincomycin, other medicines, foods, dyes or preservatives -pregnant or trying to get pregnant -breast-feeding How should I use this medicine? This medicine is only for use in the vagina. Place in the vagina using the special applicator supplied with the cream. Wash hands before and after use. Fill the applicator with cream. Lie on your back, part and bend your knees. Insert the applicator into the vagina and push the plunger to expel the cream into the vagina. Wash the applicator with warm soapy water and rinse well. Keep this medicine out of the eyes. If you do get any in your eyes rinse out with plenty of cool tap water. Take your medicine at regular intervals. Do not take your medicine more often than directed. Use this medicine for the full course prescribed by your doctor or health care professional, even if you think your condition is better. Do not stop using except on your the advice of your doctor or health care professional. Talk to your pediatrician regarding the use of this medicine in children. Special care may be needed. Overdosage: If you think you have taken too much of this medicine contact a poison control center or emergency room at once. NOTE: This medicine is only for you. Do not share this medicine with others. What if I miss a dose? If you miss a dose, use it as soon as you  can. If it is almost time for your next dose, use only that dose. Do not use double or extra doses. What may interact with this medicine? Interactions are not expected. Do not use any other vaginal products without telling your doctor or health care professional. This list may not describe all possible interactions. Give your health care provider a list of all the medicines, herbs, non-prescription drugs, or dietary supplements you use. Also tell them if you smoke, drink alcohol, or use illegal drugs. Some items may interact with your medicine. What should I watch for while using this medicine? Tell your doctor or health care professional if your symptoms do not start to get better in a few days. Do not use tampons or douches while using this medicine. Do not have sex until you have finished your treatment. Having sex can make the treatment less effective. After you finish treatment, do not use latex condoms for three days. There may still be some medicine in the vagina. This can damage the latex and make the condom less effective at preventing pregnancy. Your clothing may get soiled. To help prevent reinfection, wear freshly washed cotton, not synthetic, underwear. What side effects may I notice from receiving this medicine? Side effects that you should report to your doctor or health care professional as soon as possible: -allergic reactions like skin rash, itching or hives, swelling of the face, lips, or tongue -diarrhea that is watery or severe -fever or chills, sore throat -increased thirst -itching of the vaginal or genital area -pain during sexual intercourse -stomach pain or  cramps -thick white vaginal discharge -unusual bleeding or bruising Side effects that usually do not require medical attention (report to your doctor or health care professional if they continue or are bothersome): -nausea, vomiting This list may not describe all possible side effects. Call your doctor for medical  advice about side effects. You may report side effects to FDA at 1-800-FDA-1088. Where should I keep my medicine? Keep out of the reach of children. Store at room temperature between 20 and 25 degrees C (68 and 77 degrees F). Do not freeze. Throw away any unused medicine after the expiration date. NOTE: This sheet is a summary. It may not cover all possible information. If you have questions about this medicine, talk to your doctor, pharmacist, or health care provider.  2014, Elsevier/Gold Standard. (2008-02-25 16:46:06)  Bacterial Vaginosis Bacterial vaginosis (BV) is a vaginal infection where the normal balance of bacteria in the vagina is disrupted. The normal balance is then replaced by an overgrowth of certain bacteria. There are several different kinds of bacteria that can cause BV. BV is the most common vaginal infection in women of childbearing age. CAUSES   The cause of BV is not fully understood. BV develops when there is an increase or imbalance of harmful bacteria.  Some activities or behaviors can upset the normal balance of bacteria in the vagina and put women at increased risk including:  Having a new sex partner or multiple sex partners.  Douching.  Using an intrauterine device (IUD) for contraception.  It is not clear what role sexual activity plays in the development of BV. However, women that have never had sexual intercourse are rarely infected with BV. Women do not get BV from toilet seats, bedding, swimming pools or from touching objects around them.  SYMPTOMS   Grey vaginal discharge.  A fish-like odor with discharge, especially after sexual intercourse.  Itching or burning of the vagina and vulva.  Burning or pain with urination.  Some women have no signs or symptoms at all. DIAGNOSIS  Your caregiver must examine the vagina for signs of BV. Your caregiver will perform lab tests and look at the sample of vaginal fluid through a microscope. They will look for  bacteria and abnormal cells (clue cells), a pH test higher than 4.5, and a positive amine test all associated with BV.  RISKS AND COMPLICATIONS   Pelvic inflammatory disease (PID).  Infections following gynecology surgery.  Developing HIV.  Developing herpes virus. TREATMENT  Sometimes BV will clear up without treatment. However, all women with symptoms of BV should be treated to avoid complications, especially if gynecology surgery is planned. Female partners generally do not need to be treated. However, BV may spread between female sex partners so treatment is helpful in preventing a recurrence of BV.   BV may be treated with antibiotics. The antibiotics come in either pill or vaginal cream forms. Either can be used with nonpregnant or pregnant women, but the recommended dosages differ. These antibiotics are not harmful to the baby.  BV can recur after treatment. If this happens, a second round of antibiotics will often be prescribed.  Treatment is important for pregnant women. If not treated, BV can cause a premature delivery, especially for a pregnant woman who had a premature birth in the past. All pregnant women who have symptoms of BV should be checked and treated.  For chronic reoccurrence of BV, treatment with a type of prescribed gel vaginally twice a week is helpful. HOME CARE INSTRUCTIONS  Finish all medication as directed by your caregiver.  Do not have sex until treatment is completed.  Tell your sexual partner that you have a vaginal infection. They should see their caregiver and be treated if they have problems, such as a mild rash or itching.  Practice safe sex. Use condoms. Only have 1 sex partner. PREVENTION  Basic prevention steps can help reduce the risk of upsetting the natural balance of bacteria in the vagina and developing BV:  Do not have sexual intercourse (be abstinent).  Do not douche.  Use all of the medicine prescribed for treatment of BV, even if the  signs and symptoms go away.  Tell your sex partner if you have BV. That way, they can be treated, if needed, to prevent reoccurrence. SEEK MEDICAL CARE IF:   Your symptoms are not improving after 3 days of treatment.  You have increased discharge, pain, or fever. MAKE SURE YOU:   Understand these instructions.  Will watch your condition.  Will get help right away if you are not doing well or get worse. FOR MORE INFORMATION  Division of STD Prevention (DSTDP), Centers for Disease Control and Prevention: [employer] [place] ([facility]): www.ashastd.org  Document Released: 11/21/2005 Document Revised: 02/13/2012 Document Reviewed: 07/03/2013 [facility] Patient Information 2014 [place].

## 2014-03-22 ENCOUNTER — Other Ambulatory Visit: Payer: Self-pay | Admitting: Diagnostic Neuroimaging

## 2014-04-14 ENCOUNTER — Telehealth: Payer: Self-pay | Admitting: Diagnostic Neuroimaging

## 2014-04-14 ENCOUNTER — Ambulatory Visit (INDEPENDENT_AMBULATORY_CARE_PROVIDER_SITE_OTHER): Payer: 59 | Admitting: Neurology

## 2014-04-14 VITALS — BP 106/66 | HR 90 | Temp 97.8°F

## 2014-04-14 DIAGNOSIS — R51 Headache: Secondary | ICD-10-CM

## 2014-04-14 MED ORDER — VALPROATE SODIUM 500 MG/5ML IV SOLN
1000.0000 mg | INTRAVENOUS | Status: DC
  Administered 2014-04-14: 1000 mg via INTRAVENOUS

## 2014-04-14 NOTE — Patient Instructions (Addendum)
Patient was returning to work.

## 2014-04-14 NOTE — Telephone Encounter (Signed)
Patient has had a headache since last Thursday--has been taking Topamax, Depakote and Zomig--would like to come in today for infusion--please call.

## 2014-04-14 NOTE — Telephone Encounter (Signed)
Per Dr Leta Baptist, ok for patient to have infusion, called patient scheduled for today at 1:30, confirmed time/date

## 2014-04-14 NOTE — Progress Notes (Signed)
Patient here for Depacon infusion.  Order for Depacon 1000mg  IV.  Patient to treatment room.  Patient pain level 7/10.  She stated that needles make her pass out.  I had her recline in treatment chair and Cassandra, CMA came in to distract patient.  IV started in left AC, good blood return, 24g angiocath.  Depacon 1000mg /100cc NS started at 1400.  Patient asked for refill on all her headache medicines, but had not been seen since 11-13-12.  She will have to make a follow up appointment before medications can be refilled.  Upon completion no change in pain level, but she states it becomes effective a few hours after infusion.  IV was discontinued and removed.  Patient to checkout in NAD.

## 2014-04-18 ENCOUNTER — Other Ambulatory Visit: Payer: Self-pay | Admitting: Diagnostic Neuroimaging

## 2014-05-14 ENCOUNTER — Ambulatory Visit (INDEPENDENT_AMBULATORY_CARE_PROVIDER_SITE_OTHER): Payer: 59 | Admitting: Obstetrics and Gynecology

## 2014-05-14 ENCOUNTER — Encounter: Payer: Self-pay | Admitting: Obstetrics and Gynecology

## 2014-05-14 VITALS — BP 120/76 | HR 76 | Ht 63.75 in | Wt 126.2 lb

## 2014-05-14 DIAGNOSIS — R6889 Other general symptoms and signs: Secondary | ICD-10-CM

## 2014-05-14 DIAGNOSIS — IMO0002 Reserved for concepts with insufficient information to code with codable children: Secondary | ICD-10-CM

## 2014-05-14 NOTE — Progress Notes (Signed)
Patient ID: Megan Davila, female   DOB: June 10, 1966, 48 y.o.   MRN: [id number] GYNECOLOGY  VISIT   HPI: 48 y.o.   Divorced  Caucasian  female   G1P0 with Patient's last menstrual period was 04/23/2014.   here for   Repeat pap smear. 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 Margins were negative.  Prior pap was ASCUS and positive HR HPV.  Colpo was unsatisfactory and this lead to the LEEP.   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   Menses are monthly in general.  Is old blood.   GYNECOLOGIC HISTORY: Patient's last menstrual period was 04/23/2014. Contraception:  Tubal ligation  Menopausal hormone therapy: n/a        OB History   Grav Para Term Preterm Abortions TAB SAB Ect Mult Living   1         1         There are no active problems to display for this patient.   Past Medical History  Diagnosis Date  . Heart murmur   . GERD (gastroesophageal reflux disease)   . Headache(784.0)   . Anxiety   . Depression   . PONV (postoperative nausea and vomiting)   . Anemia   . History of kidney stones   . Abnormal Pap smear     Past Surgical History  Procedure Laterality Date  . Gastric bypass  2002  . Cholecystectomy    . Tonsillectomy    . Lasik    . Nose surgery    . Hysteroscopy w/d&c  10/14/2011    Procedure: DILATATION AND CURETTAGE (D&C) /HYSTEROSCOPY;  Surgeon: Arloa Koh;  Location: [facility];  Service: Gynecology;  Laterality: Bilateral;  . Tonsillectomy    . 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 Cesarean section  2008  . Tubal ligation  2008    Current Outpatient Prescriptions  Medication Sig Dispense Refill  . ALPRAZolam (XANAX) 1 MG tablet Take 1 mg by mouth 2 (two) times daily as needed. Anxiety        . Cyanocobalamin (VITAMIN B-12 IJ) Inject 1 each as directed every 30 (thirty) days.        . divalproex (DEPAKOTE) 250 MG DR  tablet TAKE 1 TABLET BY MOUTH ONCE AT BEDTIME  10 tablet  0  . ergocalciferol (VITAMIN D2) 50000 UNITS capsule Take 50,000 Units by mouth once a week.        . ferrous sulfate 325 (65 FE) MG tablet Take 325 mg by mouth daily with breakfast.        . fexofenadine (ALLEGRA) 180 MG tablet Take 180 mg by mouth daily as needed. allergies       . lansoprazole (PREVACID) 30 MG capsule Take 30 mg by mouth daily as needed. Acid reflux        . Multiple Vitamins-Minerals (MULTIVITAMIN WITH MINERALS) tablet Take 1 tablet by mouth daily.        . rizatriptan (MAXALT) 10 MG tablet Take 10 mg by mouth as needed for migraine. May repeat in 2 hours if needed      . topiramate (TOPAMAX) 50 MG tablet take 1 tablet by mouth every morning and 2 tablets AT NIGHT  90 tablet  1  . venlafaxine XR (EFFEXOR-XR) 150 MG 24 hr capsule Take 150 mg by mouth daily.      Marland Kitchen  zolmitriptan (ZOMIG-ZMT) 5 MG disintegrating tablet PLACE 1 TABLET BY MOUTH ON TONGUE AT ONSET OF HEADACHE; MAY REPEAT DOSE ONCE IF NO RELIEF IN 2 HOURS; MAXIMUM OF 2 TABLETS IN 24 HOURS  8 tablet  0   Current Facility-Administered Medications  Medication Dose Route Frequency Provider Last Rate Last Dose  . valproate (DEPACON) 1,000 mg in sodium chloride 0.9 % 100 mL IVPB  1,000 mg Intravenous Continuous Penni Bombard, MD   1,000 mg at 04/19/13 1626  . valproate (DEPACON) 1,000 mg in sodium chloride 0.9 % 100 mL IVPB  1,000 mg Intravenous Continuous Penni Bombard, MD   1,000 mg at 04/14/14 1405     ALLERGIES: Naproxen; Hydromorphone; Monistat; and Sulfa antibiotics  Family History  Problem Relation Age of Onset  . Diabetes Mother   . Heart disease Father   . Social phobia Father   . Psychiatric Illness Father   . Hypertension Father   . Stroke Father   . Heart disease    . Diabetes    . Stroke    . Hypertension Maternal Grandmother   . Thyroid disease Maternal Grandmother   . Hypertension Paternal Grandmother   . Stroke Paternal  Grandfather     History   Social History  . Marital Status: Divorced    Spouse Name: N/A    Number of Children: N/A  . Years of Education: N/A   Occupational History  . Not on file.   Social History Main Topics  . Smoking status: Never Smoker   . Smokeless tobacco: Never Used  . Alcohol Use: Yes     Comment: Once a month  . Drug Use: No  . Sexual Activity: Yes    Partners: Male    Birth Control/ Protection: Surgical     Comment: tubal   Other Topics Concern  . Not on file   Social History Narrative   Pt lives at home with her family.   Caffeine Use: once daily    ROS:  Pertinent items are noted in HPI.  PHYSICAL EXAMINATION:    BP 120/76  Pulse 76  Ht 5' 3.75" (1.619 m)  Wt 126 lb 3.2 oz (57.244 kg)  BMI 21.84 kg/m2  LMP 04/23/2014     General appearance: alert, cooperative and appears stated age Lungs: clear to auscultation bilaterally Heart: regular rate and rhythm Abdomen: soft, non-tender; no masses,  no organomegaly No abnormal inguinal nodes palpated  Pelvic: External genitalia:  no lesions              Urethra:  normal appearing urethra with no masses, tenderness or lesions              Bartholins and Skenes: normal                 Vagina: normal appearing vagina with normal color and discharge, no lesions              Cervix: normal appearance.                   Bimanual Exam:  Uterus:  uterus is normal size, shape, consistency and nontender.  Has more anterior cervix palpable compared to posterior cervix.                                       Adnexa: normal adnexa in size, nontender and no masses  ASSESSMENT  Status post LEEP.  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  History of prior cervical conization.    PLAN  Pap and HR HPV testing.  Follow up in 5 months for annual exam and next anticipated pap.    An After Visit Summary was printed and given to the patient.  _15_____ minutes face to face time of which over  50% was spent in counseling.

## 2014-05-19 LAB — IPS PAP TEST WITH HPV

## 2014-05-21 ENCOUNTER — Encounter: Payer: Self-pay | Admitting: Diagnostic Neuroimaging

## 2014-05-21 ENCOUNTER — Ambulatory Visit (INDEPENDENT_AMBULATORY_CARE_PROVIDER_SITE_OTHER): Payer: 59 | Admitting: Diagnostic Neuroimaging

## 2014-05-21 VITALS — BP 107/79 | HR 73 | Ht 63.0 in | Wt 125.0 lb

## 2014-05-21 DIAGNOSIS — G43109 Migraine with aura, not intractable, without status migrainosus: Secondary | ICD-10-CM

## 2014-05-21 DIAGNOSIS — R51 Headache: Secondary | ICD-10-CM

## 2014-05-21 MED ORDER — DIVALPROEX SODIUM 250 MG PO DR TAB: 250.0000 mg | DELAYED_RELEASE_TABLET | Freq: Every day | ORAL | Status: DC

## 2014-05-21 MED ORDER — ZOLMITRIPTAN 5 MG PO TBDP: ORAL_TABLET | ORAL | Status: DC

## 2014-05-21 MED ORDER — TOPIRAMATE 50 MG PO TABS: ORAL_TABLET | ORAL | Status: DC

## 2014-05-21 NOTE — Progress Notes (Signed)
[facility]  PATIENT: Megan Davila DOB: 1966/11/04  REFERRING CLINICIAN:  HISTORY FROM: patient  REASON FOR VISIT: follow up   HISTORICAL  CHIEF COMPLAINT:  Chief Complaint  Patient presents with  . Follow-up    MS    HISTORY OF PRESENT ILLNESS:   UPDATE 05/21/14: Having migraine HAs 3 months out of per year. When she has migraine, it usually lasts up to 1 week at a time, and then she runs out of triptan. Using maxalt and zomig (alternating). Still on TPX and VPA for prevention. Still with stress related to work.  UPDATE 11/13/12 (JM):  She has been doing well without  any severe headaches until today she has a headache 6/10 with sharp pains behind her eyes. She has not taken any pain medications this morning.  She has had a 7 lb weight loss since last visit now 117lbs, has a history of poor absorption with oral medications.     UPDATE 09/20/12 (JM):  Returns to office since 2011, headaches in the last couple of months, has been persistent with headaches.  She had gastric bypass 2002, pregnant 6 years ago. Was 180 and is now 125lbs.  Has increased stress at work.  Tolerating Topamax well but feels that when she takes she has a loss of appetite.  Helpful with headache.  Increase sensitivity to hearing, photosensitivity, tinnitus and nausea.  She feels the air filters at work are causing headaches.  She is unable to state how often has severe headaches.  Has pain behind both eyes.  Unable to take NSAID's secondary to anaphylaxis.  Seen PCP October 10 and prescribed Relapax but has not tried.    PRIOR HPI (04/01/10, VRP): 48 year old right-handed female with history of gastric bypass surgery presenting for evaluation of new onset severe left-sided headache on March 23, 2010. On March 23, 2010 patient developed sudden onset left-sided headache behind her left eye. This was associated with seeing spots, feeling nausea and photophobia/phonophobia.  Headache lasted for  several days. On April 21 she was seen by her primary care physician who prescribed Maxalt (provided mild relief) and hydrocodone (no relief). On April 24 she was evaluated at [facility] emergency room. Since that time her headaches have resolved. She does report persistent mild neck pain and difficulty sleeping. She does report remote history of similar headaches 10 years ago. There is no family history of migraine headache. She denies numbness or weakness in arms or legs.  REVIEW OF SYSTEMS: Full 14 system review of systems performed and notable only for headache nausea murmur freq waking fatigue ringing in ears light sens.  ALLERGIES: Allergies  Allergen Reactions  . Naproxen Anaphylaxis  . Monistat [Miconazole] Other (See Comments)    "burning"   . Sulfa Antibiotics Hives    HOME MEDICATIONS: Outpatient Prescriptions Prior to Visit  Medication Sig Dispense Refill  . ALPRAZolam (XANAX) 1 MG tablet Take 1 mg by mouth 2 (two) times daily as needed. Anxiety        . Cyanocobalamin (VITAMIN B-12 IJ) Inject 1 each as directed every 30 (thirty) days.        . ergocalciferol (VITAMIN D2) 50000 UNITS capsule Take 50,000 Units by mouth once a week.        . ferrous sulfate 325 (65 FE) MG tablet Take 325 mg by mouth daily with breakfast.        . fexofenadine (ALLEGRA) 180 MG tablet Take 180 mg by mouth daily as needed. allergies       .  lansoprazole (PREVACID) 30 MG capsule Take 30 mg by mouth daily as needed. Acid reflux        . Multiple Vitamins-Minerals (MULTIVITAMIN WITH MINERALS) tablet Take 1 tablet by mouth daily.        Marland Kitchen venlafaxine XR (EFFEXOR-XR) 150 MG 24 hr capsule Take 150 mg by mouth daily.      . divalproex (DEPAKOTE) 250 MG DR tablet TAKE 1 TABLET BY MOUTH ONCE AT BEDTIME  10 tablet  0  . rizatriptan (MAXALT) 10 MG tablet Take 10 mg by mouth as needed for migraine. May repeat in 2 hours if needed      . topiramate (TOPAMAX) 50 MG tablet take 1 tablet by mouth every morning  and 2 tablets AT NIGHT  90 tablet  1  . zolmitriptan (ZOMIG-ZMT) 5 MG disintegrating tablet PLACE 1 TABLET BY MOUTH ON TONGUE AT ONSET OF HEADACHE; MAY REPEAT DOSE ONCE IF NO RELIEF IN 2 HOURS; MAXIMUM OF 2 TABLETS IN 24 HOURS  8 tablet  0  . valproate (DEPACON) 1,000 mg in sodium chloride 0.9 % 100 mL IVPB       . valproate (DEPACON) 1,000 mg in sodium chloride 0.9 % 100 mL IVPB        No facility-administered medications prior to visit.    PAST MEDICAL HISTORY: Past Medical History  Diagnosis Date  . Heart murmur   . GERD (gastroesophageal reflux disease)   . Headache(784.0)   . Anxiety   . Depression   . PONV (postoperative nausea and vomiting)   . Anemia   . History of kidney stones   . Abnormal Pap smear     PAST SURGICAL HISTORY: Past Surgical History  Procedure Laterality Date  . Gastric bypass  2002  . Cholecystectomy    . Tonsillectomy    . Lasik    . Nose surgery    . Hysteroscopy w/d&c  10/14/2011    Procedure: DILATATION AND CURETTAGE (D&C) /HYSTEROSCOPY;  Surgeon: Arloa Koh;  Location: [facility];  Service: Gynecology;  Laterality: Bilateral;  . Tonsillectomy    . 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 Cesarean section  2008  . Tubal ligation  2008    FAMILY HISTORY: Family History  Problem Relation Age of Onset  . Diabetes Mother   . Heart disease Father   . Social phobia Father   . Psychiatric Illness Father   . Hypertension Father   . Stroke Father   . Heart disease    . Diabetes    . Stroke    . Hypertension Maternal Grandmother   . Thyroid disease Maternal Grandmother   . Hypertension Paternal Grandmother   . Stroke Paternal Grandfather     SOCIAL HISTORY:  History   Social History  . Marital Status: Divorced    Spouse Name: N/A    Number of Children: 1  . Years of Education: College   Occupational History  .  [employer]   Social History Main Topics  . Smoking status: Never Smoker   .  Smokeless tobacco: Never Used  . Alcohol Use: Yes     Comment: Once a month  . Drug Use: No  . Sexual Activity: Yes    Partners: Male    Birth Control/ Protection: Surgical     Comment: tubal   Other Topics Concern  . Not on file   Social History Narrative   Pt lives at home with her family.  Caffeine Use: once daily     PHYSICAL EXAM  Filed Vitals:   05/21/14 0822  BP: 107/79  Pulse: 73  Height: 5\' 3"  (1.6 m)  Weight: 125 lb (56.7 kg)    Not recorded    Body mass index is 22.15 kg/(m^2).  GENERAL EXAM: Patient is in no distress; well developed, nourished and groomed; neck is supple  CARDIOVASCULAR: Regular rate and rhythm, no murmurs, no carotid bruits  NEUROLOGIC: MENTAL STATUS: awake, alert, language fluent, comprehension intact, naming intact, fund of knowledge appropriate CRANIAL NERVE: no papilledema on fundoscopic exam, pupils equal and reactive to light, visual fields full to confrontation, extraocular muscles intact, no nystagmus, facial sensation and strength symmetric, hearing intact, palate elevates symmetrically, uvula midline, shoulder shrug symmetric, tongue midline. MOTOR: normal bulk and tone, full strength in the BUE, BLE SENSORY: normal and symmetric to light touch COORDINATION: finger-nose-finger, fine finger movements normal REFLEXES: deep tendon reflexes present and symmetric GAIT/STATION: narrow based gait     DIAGNOSTIC DATA (LABS, IMAGING, TESTING) - I reviewed patient records, labs, notes, testing and imaging myself where available.  Lab Results  Component Value Date   WBC 6.4 10/06/2011   HGB 13.7 10/06/2011   HCT 41.8 10/06/2011   MCV 90.7 10/06/2011   PLT 314 10/06/2011      Component Value Date/Time   NA 137 01/01/2010 1451   K 3.7 01/01/2010 1451   CL 104 01/01/2010 1451   GLUCOSE 96 01/01/2010 1451   BUN 8 01/01/2010 1451   CREATININE 0.7 01/01/2010 1451   No results found for this basename: CHOL, HDL, LDLCALC, LDLDIRECT,  TRIG, CHOLHDL   No results found for this basename: HGBA1C   No results found for this basename: VITAMINB12   No results found for this basename: TSH    04/17/10 MRI brain - Essentially normal MRI brain.  There are few non-specific foci of gliosis which can be seen in association with chronic migraine headaches, and are of doubtful clinical significance.  04/17/10 MRA head - normal  01/09/13 CT head - normal    ASSESSMENT AND PLAN  48 y.o. year old female here with history of gastric bypass surgery and intermittent headaches, likely migraine with aura. She is also having increased stress with work.    PLAN: 1. Continue TPX 50 / 100 2. Continue VPA 250mg  qhs 3. Continue Zomig ZMT   Meds ordered this encounter  Medications  . topiramate (TOPAMAX) 50 MG tablet    Sig: 1 tab qAM and 2 tabs qPM    Dispense:  270 tablet    Refill:  4  . divalproex (DEPAKOTE) 250 MG DR tablet    Sig: Take 1 tablet (250 mg total) by mouth at bedtime.    Dispense:  90 tablet    Refill:  4  . zolmitriptan (ZOMIG-ZMT) 5 MG disintegrating tablet    Sig: PLACE 1 TABLET BY MOUTH ON TONGUE AT ONSET OF HEADACHE; MAY REPEAT x 1 AFTER 2 HOURS; MAX 2 TABS IN 24 HOURS    Dispense:  8 tablet    Refill:  12   Return in about 1 year (around 05/22/2015).    Penni Bombard, MD 2/84/1324, 4:01 AM Certified in Neurology, Neurophysiology and Neuroimaging  [facility] [address], [place], [place] [address] [phone]

## 2014-05-21 NOTE — Patient Instructions (Signed)
Keep headache log/diary to look for other triggering factors.   Continue topiramate and divalproex for headache prevention.  Use zomig tabs as needed for breakthrough headaches. May use tylenol with zomig.

## 2014-08-08 ENCOUNTER — Encounter: Payer: Self-pay | Admitting: Obstetrics and Gynecology

## 2014-10-06 ENCOUNTER — Encounter: Payer: Self-pay | Admitting: Diagnostic Neuroimaging

## 2014-10-08 ENCOUNTER — Ambulatory Visit: Payer: 59 | Admitting: Obstetrics and Gynecology

## 2014-10-09 ENCOUNTER — Ambulatory Visit (INDEPENDENT_AMBULATORY_CARE_PROVIDER_SITE_OTHER): Payer: 59 | Admitting: Obstetrics and Gynecology

## 2014-10-09 ENCOUNTER — Encounter: Payer: Self-pay | Admitting: Obstetrics and Gynecology

## 2014-10-09 VITALS — BP 136/84 | HR 74 | Resp 12 | Ht 63.25 in | Wt 122.0 lb

## 2014-10-09 DIAGNOSIS — Z01419 Encounter for gynecological examination (general) (routine) without abnormal findings: Secondary | ICD-10-CM

## 2014-10-09 DIAGNOSIS — R8290 Unspecified abnormal findings in urine: Secondary | ICD-10-CM

## 2014-10-09 DIAGNOSIS — Z0000 Encounter for general adult medical examination without abnormal findings: Secondary | ICD-10-CM

## 2014-10-09 LAB — POCT URINALYSIS DIPSTICK
PH UA: 5
Urobilinogen, UA: >=8

## 2014-10-09 NOTE — Patient Instructions (Signed)

## 2014-10-09 NOTE — Progress Notes (Signed)
GYNECOLOGY VISIT  PCP: Carol Ada, MD  Referring provider:   HPI: 48 y.o.   Divorced  Caucasian  female   G1P0 with Patient's last menstrual period was 09/18/2014.   here for Annual exam.  Status post LEEP 11/14/13 due to unsatisfactory colposcopy and HPV effect noted on colpo biopsy.  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  Dad has health issues.  In nursing home - Lewey body dementia.   No change in menses.  Menses monthly.   Hgb:  PCP Urine:  WBC's Trace, Urobilinogen - no dysuria.   GYNECOLOGIC HISTORY: Patient's last menstrual period was 09/18/2014. Sexually active:  Yes  Partner preference: Female  Contraception:   Tubal Ligation Menopausal hormone therapy: no DES exposure:   no Blood transfusions:   no Sexually transmitted diseases:    GYN procedures and prior surgeries:  no Last mammogram:  09/2013    Bi- Rads 1: Negative.        Last pap and high risk HPV testing: 05/14/14 NEG, positive HR HPV   History of abnormal pap smear:  10/07/13 ASCUS + HPV   OB History    Gravida Para Term Preterm AB TAB SAB Ectopic Multiple Living   1         1       LIFESTYLE: Exercise:  No        OTHER HEALTH MAINTENANCE: Tetanus/TDap: 10/17/2013 HPV: no Influenza:  2015   Bone density: no Colonoscopy: no  Cholesterol check: 2014- Normal  Family History  Problem Relation Age of Onset  . Diabetes Mother   . Heart disease Father   . Social phobia Father   . Psychiatric Illness Father   . Hypertension Father   . Stroke Father   . Heart disease    . Diabetes    . Stroke    . Hypertension Maternal Grandmother   . Thyroid disease Maternal Grandmother   . Hypertension Paternal Grandmother   . Stroke Paternal Grandfather     Patient Active Problem List   Diagnosis Date Noted  . Migraine with aura 05/21/2014  . 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 Heart murmur   . GERD  (gastroesophageal reflux disease)   . Headache(784.0)   . Anxiety   . Depression   . PONV (postoperative nausea and vomiting)   . Anemia   . History of kidney stones   . Abnormal Pap smear     Past Surgical History  Procedure Laterality Date  . Gastric bypass  2002  . Cholecystectomy    . Tonsillectomy    . Lasik    . Nose surgery    . Hysteroscopy w/d&c  10/14/2011    Procedure: DILATATION AND CURETTAGE (D&C) /HYSTEROSCOPY;  Surgeon: Arloa Koh;  Location: [facility];  Service: Gynecology;  Laterality: Bilateral;  . Tonsillectomy    . 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 Cesarean section  2008  . Tubal ligation  2008    ALLERGIES: Naproxen; Monistat; and Sulfa antibiotics  Current Outpatient Prescriptions  Medication Sig Dispense Refill  . ALPRAZolam (XANAX) 1 MG tablet Take 1 mg by mouth 2 (two) times daily as needed. Anxiety      . Cyanocobalamin (VITAMIN B-12 IJ) Inject 1 each as directed every 30 (thirty) days.      . divalproex (DEPAKOTE) 250 MG DR tablet Take 1 tablet (250 mg  total) by mouth at bedtime. 90 tablet 4  . ergocalciferol (VITAMIN D2) 50000 UNITS capsule Take 50,000 Units by mouth once a week.      . ferrous sulfate 325 (65 FE) MG tablet Take 325 mg by mouth daily with breakfast.      . fexofenadine (ALLEGRA) 180 MG tablet Take 180 mg by mouth daily as needed. allergies     . lansoprazole (PREVACID) 30 MG capsule Take 30 mg by mouth daily as needed. Acid reflux      . Multiple Vitamins-Minerals (MULTIVITAMIN WITH MINERALS) tablet Take 1 tablet by mouth daily.      Marland Kitchen topiramate (TOPAMAX) 50 MG tablet 1 tab qAM and 2 tabs qPM 270 tablet 4  . venlafaxine XR (EFFEXOR-XR) 150 MG 24 hr capsule Take 150 mg by mouth daily.    Marland Kitchen zolmitriptan (ZOMIG-ZMT) 5 MG disintegrating tablet PLACE 1 TABLET BY MOUTH ON TONGUE AT ONSET OF HEADACHE; MAY REPEAT x 1 AFTER 2 HOURS; MAX 2 TABS IN 24 HOURS 8 tablet 12   No current  facility-administered medications for this visit.     ROS:  Pertinent items are noted in HPI.  History   Social History  . Marital Status: Divorced    Spouse Name: N/A    Number of Children: 1  . Years of Education: College   Occupational History  .  [employer]   Social History Main Topics  . Smoking status: Never Smoker   . Smokeless tobacco: Never Used  . Alcohol Use: Yes     Comment: Once a month  . Drug Use: No  . Sexual Activity:    Partners: Male    Birth Control/ Protection: Surgical     Comment: tubal   Other Topics Concern  . Not on file   Social History Narrative   Pt lives at home with her family.   Caffeine Use: once daily    PHYSICAL EXAMINATION:    BP 136/84 mmHg  Pulse 74  Resp 12  Ht 5' 3.25" (1.607 m)  Wt 122 lb (55.339 kg)  BMI 21.43 kg/m2  LMP 09/18/2014   Wt Readings from Last 3 Encounters:  10/09/14 122 lb (55.339 kg)  05/21/14 125 lb (56.7 kg)  05/14/14 126 lb 3.2 oz (57.244 kg)     Ht Readings from Last 3 Encounters:  10/09/14 5' 3.25" (1.607 m)  05/21/14 5\' 3"  (1.6 m)  05/14/14 5' 3.75" (1.619 m)    General appearance: alert, cooperative and appears stated age Head: Normocephalic, without obvious abnormality, atraumatic Neck: no adenopathy, supple, symmetrical, trachea midline and thyroid not enlarged, symmetric, no tenderness/mass/nodules Lungs: clear to auscultation bilaterally Breasts: Inspection negative, No nipple retraction or dimpling, No nipple discharge or bleeding, No axillary or supraclavicular adenopathy, Normal to palpation without dominant masses Heart: regular rate and rhythm Abdomen: soft, non-tender; no masses,  no organomegaly Extremities: extremities normal, atraumatic, no cyanosis or edema Skin: Skin color, texture, turgor normal. No rashes or lesions Lymph nodes: Cervical, supraclavicular, and axillary nodes normal. No abnormal inguinal nodes palpated Neurologic: Grossly normal  Pelvic: External  genitalia:  no lesions              Urethra:  normal appearing urethra with no masses, tenderness or lesions              Bartholins and Skenes: normal                 Vagina: normal appearing vagina with normal color and discharge,  no lesions              Cervix: normal appearance              Pap and high risk HPV testing done: Yes.          Bimanual Exam:  Uterus:  uterus is normal size, shape, consistency and nontender                                      Adnexa: normal adnexa in size, nontender and no masses                                      Rectovaginal:  Yes.                                        Confirms above.                                      Anus:  normal sphincter tone, no lesions  ASSESSMENT  Normal gynecologic exam. History of LEEP  - LGSIL. History of prior conization of cervix.  Abnormal urine.  Situational stress.   PLAN  Mammogram recommended yearly. Pap smear and high risk HPV testing as above. Counseled on self breast exam, Calcium and vitamin D intake, exercise. See lab orders: Yes.   Urine micro and culture.  Routine labs with PCP. Support given.  Return annually or prn   An After Visit Summary was printed and given to the patient.

## 2014-10-11 LAB — URINALYSIS, MICROSCOPIC ONLY
CRYSTALS: NONE SEEN
Casts: NONE SEEN

## 2014-10-12 ENCOUNTER — Encounter: Payer: Self-pay | Admitting: Obstetrics and Gynecology

## 2014-10-12 LAB — URINE CULTURE
Colony Count: NO GROWTH
Organism ID, Bacteria: NO GROWTH

## 2014-10-15 LAB — IPS PAP TEST WITH HPV

## 2014-12-31 ENCOUNTER — Telehealth: Payer: Self-pay | Admitting: *Deleted

## 2014-12-31 NOTE — Telephone Encounter (Signed)
Appt changed

## 2015-03-21 ENCOUNTER — Other Ambulatory Visit: Payer: Self-pay | Admitting: Diagnostic Neuroimaging

## 2015-04-06 ENCOUNTER — Other Ambulatory Visit: Payer: Self-pay | Admitting: Gastroenterology

## 2015-04-06 DIAGNOSIS — R112 Nausea with vomiting, unspecified: Secondary | ICD-10-CM

## 2015-04-06 DIAGNOSIS — K219 Gastro-esophageal reflux disease without esophagitis: Secondary | ICD-10-CM

## 2015-04-08 ENCOUNTER — Ambulatory Visit
Admission: RE | Admit: 2015-04-08 | Discharge: 2015-04-08 | Disposition: A | Discharge status: Home / Self Care | Payer: Self-pay | Source: Ambulatory Visit | Attending: Gastroenterology | Admitting: Gastroenterology

## 2015-04-08 ENCOUNTER — Other Ambulatory Visit: Payer: Self-pay | Admitting: Gastroenterology

## 2015-04-08 DIAGNOSIS — R112 Nausea with vomiting, unspecified: Secondary | ICD-10-CM

## 2015-04-08 DIAGNOSIS — K219 Gastro-esophageal reflux disease without esophagitis: Secondary | ICD-10-CM

## 2015-04-16 ENCOUNTER — Encounter (HOSPITAL_COMMUNITY): Payer: Self-pay

## 2015-04-16 ENCOUNTER — Inpatient Hospital Stay (HOSPITAL_COMMUNITY)
Admission: EM | Admit: 2015-04-16 | Discharge: 2015-04-18 | DRG: 380 | Disposition: A | Discharge status: Other Acute Inpatient Hospital | Payer: 59 | Attending: Internal Medicine | Admitting: Internal Medicine

## 2015-04-16 DIAGNOSIS — K219 Gastro-esophageal reflux disease without esophagitis: Secondary | ICD-10-CM | POA: Diagnosis present

## 2015-04-16 DIAGNOSIS — Z833 Family history of diabetes mellitus: Secondary | ICD-10-CM

## 2015-04-16 DIAGNOSIS — Z9049 Acquired absence of other specified parts of digestive tract: Secondary | ICD-10-CM | POA: Diagnosis present

## 2015-04-16 DIAGNOSIS — E43 Unspecified severe protein-calorie malnutrition: Secondary | ICD-10-CM | POA: Diagnosis present

## 2015-04-16 DIAGNOSIS — F419 Anxiety disorder, unspecified: Secondary | ICD-10-CM | POA: Diagnosis present

## 2015-04-16 DIAGNOSIS — Z8249 Family history of ischemic heart disease and other diseases of the circulatory system: Secondary | ICD-10-CM

## 2015-04-16 DIAGNOSIS — R110 Nausea: Secondary | ICD-10-CM

## 2015-04-16 DIAGNOSIS — Z87442 Personal history of urinary calculi: Secondary | ICD-10-CM

## 2015-04-16 DIAGNOSIS — Z681 Body mass index (BMI) 19 or less, adult: Secondary | ICD-10-CM

## 2015-04-16 DIAGNOSIS — Z9884 Bariatric surgery status: Secondary | ICD-10-CM

## 2015-04-16 DIAGNOSIS — K311 Adult hypertrophic pyloric stenosis: Secondary | ICD-10-CM | POA: Diagnosis present

## 2015-04-16 DIAGNOSIS — Z79899 Other long term (current) drug therapy: Secondary | ICD-10-CM

## 2015-04-16 DIAGNOSIS — R531 Weakness: Secondary | ICD-10-CM | POA: Diagnosis not present

## 2015-04-16 DIAGNOSIS — F329 Major depressive disorder, single episode, unspecified: Secondary | ICD-10-CM | POA: Diagnosis present

## 2015-04-16 DIAGNOSIS — Z886 Allergy status to analgesic agent status: Secondary | ICD-10-CM

## 2015-04-16 DIAGNOSIS — Z888 Allergy status to other drugs, medicaments and biological substances status: Secondary | ICD-10-CM

## 2015-04-16 DIAGNOSIS — Z823 Family history of stroke: Secondary | ICD-10-CM

## 2015-04-16 DIAGNOSIS — G43109 Migraine with aura, not intractable, without status migrainosus: Secondary | ICD-10-CM | POA: Diagnosis present

## 2015-04-16 DIAGNOSIS — E876 Hypokalemia: Secondary | ICD-10-CM | POA: Diagnosis present

## 2015-04-16 DIAGNOSIS — Z882 Allergy status to sulfonamides status: Secondary | ICD-10-CM

## 2015-04-16 LAB — URINALYSIS, ROUTINE W REFLEX MICROSCOPIC
Glucose, UA: NEGATIVE mg/dL
Hgb urine dipstick: NEGATIVE
KETONES UR: NEGATIVE mg/dL
Leukocytes, UA: NEGATIVE
NITRITE: NEGATIVE
PH: 6.5 (ref 5.0–8.0)
Protein, ur: NEGATIVE mg/dL
SPECIFIC GRAVITY, URINE: 1.027 (ref 1.005–1.030)
Urobilinogen, UA: 1 mg/dL (ref 0.0–1.0)

## 2015-04-16 LAB — CBC WITH DIFFERENTIAL/PLATELET
BASOS ABS: 0 10*3/uL (ref 0.0–0.1)
BASOS PCT: 0 % (ref 0–1)
Eosinophils Absolute: 0.1 10*3/uL (ref 0.0–0.7)
Eosinophils Relative: 2 % (ref 0–5)
HEMATOCRIT: 35 % — AB (ref 36.0–46.0)
Hemoglobin: 10.9 g/dL — ABNORMAL LOW (ref 12.0–15.0)
LYMPHS ABS: 2.4 10*3/uL (ref 0.7–4.0)
Lymphocytes Relative: 40 % (ref 12–46)
MCH: 26.5 pg (ref 26.0–34.0)
MCHC: 31.1 g/dL (ref 30.0–36.0)
MCV: 85 fL (ref 78.0–100.0)
Monocytes Absolute: 0.5 10*3/uL (ref 0.1–1.0)
Monocytes Relative: 9 % (ref 3–12)
Neutro Abs: 3 10*3/uL (ref 1.7–7.7)
Neutrophils Relative %: 49 % (ref 43–77)
Platelets: 343 10*3/uL (ref 150–400)
RBC: 4.12 MIL/uL (ref 3.87–5.11)
RDW: 14.5 % (ref 11.5–15.5)
WBC: 6.1 10*3/uL (ref 4.0–10.5)

## 2015-04-16 LAB — BASIC METABOLIC PANEL
Anion gap: 8 (ref 5–15)
BUN: 10 mg/dL (ref 6–20)
CO2: 29 mmol/L (ref 22–32)
CREATININE: 0.85 mg/dL (ref 0.44–1.00)
Calcium: 8.7 mg/dL — ABNORMAL LOW (ref 8.9–10.3)
Chloride: 99 mmol/L — ABNORMAL LOW (ref 101–111)
GFR calc Af Amer: >60 mL/min (ref >60)
GFR calc non Af Amer: >60 mL/min (ref >60)
GLUCOSE: 118 mg/dL — AB (ref 65–99)
Potassium: 2.8 mmol/L — ABNORMAL LOW (ref 3.5–5.1)
Sodium: 136 mmol/L (ref 135–145)

## 2015-04-16 LAB — PREGNANCY, URINE: Preg Test, Ur: NEGATIVE

## 2015-04-16 MED ORDER — POTASSIUM CHLORIDE 10 MEQ/100ML IV SOLN
10.0000 meq | INTRAVENOUS | Status: AC
  Administered 2015-04-16 – 2015-04-17 (×4): 10 meq via INTRAVENOUS
  Filled 2015-04-16 (×4): qty 100

## 2015-04-16 MED ORDER — SODIUM CHLORIDE 0.9 % IV BOLUS (SEPSIS): 1000.0000 mL | Freq: Once | INTRAVENOUS | Status: DC

## 2015-04-16 MED ORDER — SODIUM CHLORIDE 0.9 % IV SOLN: INTRAVENOUS | Status: DC

## 2015-04-16 MED ORDER — SODIUM CHLORIDE 0.9 % IV BOLUS (SEPSIS)
1000.0000 mL | Freq: Once | INTRAVENOUS | Status: AC
  Administered 2015-04-16: 1000 mL via INTRAVENOUS

## 2015-04-16 MED ORDER — DIPHENHYDRAMINE HCL 50 MG/ML IJ SOLN
25.0000 mg | Freq: Once | INTRAMUSCULAR | Status: AC
  Administered 2015-04-16: 25 mg via INTRAVENOUS
  Filled 2015-04-16: qty 1

## 2015-04-16 MED ORDER — METOCLOPRAMIDE HCL 5 MG/ML IJ SOLN
10.0000 mg | Freq: Once | INTRAMUSCULAR | Status: AC
  Administered 2015-04-16: 10 mg via INTRAVENOUS
  Filled 2015-04-16: qty 2

## 2015-04-16 NOTE — ED Notes (Signed)
Pt had endoscopy last week for reflux.  Pt was told she had esophageal stricture. Pt had a gastric bypass in 2002.  Pt is not eating and feels weak.  Trying to get scheduled for a surgery.

## 2015-04-16 NOTE — ED Notes (Signed)
Unable to stick patient at this time patient stated she needs to lay down when drawing blood.

## 2015-04-16 NOTE — ED Provider Notes (Signed)
CSN: [id number]     Arrival date & time 04/16/15  1713 History   First MD Initiated Contact with Patient 04/16/15 1803     Chief Complaint  Patient presents with  . Gastrophageal Reflux  . Weakness     (Consider location/radiation/quality/duration/timing/severity/associated sxs/prior Treatment) HPI  Pt presenting with c/o fatigue, weakness- states she has had epigastric pain and problems with reflux- she has not been able to eat and drink, she is continuing to have unwanted weight loss- lost 5 pounds in the past week.  She has hx of gastric bypass in 2002, has been followed by GI since then.  Recently had endoscopy and upper GI.  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  Pt states she talked to Dr. Paulita Fujita, GI today who instructed her to come to the ED to be admitted.  No fever/chills.  No change in her abdominal pain.  No change in stools.  There are no other associated systemic symptoms, there are no other alleviating or modifying factors.   Past Medical History  Diagnosis Date  . Heart murmur   . GERD (gastroesophageal reflux disease)   . Headache(784.0)   . Anxiety   . Depression   . PONV (postoperative nausea and vomiting)   . Anemia   . History of kidney stones   . Abnormal Pap smear    Past Surgical History  Procedure Laterality Date  . Gastric bypass  2002  . Cholecystectomy    . Tonsillectomy    . Lasik    . Nose surgery    . Hysteroscopy w/d&c  10/14/2011    Procedure: DILATATION AND CURETTAGE (D&C) /HYSTEROSCOPY;  Surgeon: Arloa Koh;  Location: [facility];  Service: Gynecology;  Laterality: Bilateral;  . Tonsillectomy    . 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 Cesarean section  2008  . Tubal ligation  2008  . Cervical biopsy  w/ loop electrode excision  11/2013    LGSIL   Family History  Problem Relation Age of Onset  . Diabetes Mother   . Heart disease Father   .  Social phobia Father   . Psychiatric Illness Father   . Hypertension Father   . Stroke Father   . Heart disease    . Diabetes    . Stroke    . Hypertension Maternal Grandmother   . Thyroid disease Maternal Grandmother   . Hypertension Paternal Grandmother   . Stroke Paternal Grandfather   . Dementia Father    History  Substance Use Topics  . Smoking status: Never Smoker   . Smokeless tobacco: Never Used  . Alcohol Use: Yes     Comment: Once a month   OB History    Gravida Para Term Preterm AB TAB SAB Ectopic Multiple Living   1 1       1 1      Review of Systems  ROS reviewed and all otherwise negative except for mentioned in HPI    Allergies  Naproxen; Monistat; and Sulfa antibiotics  Home Medications   Prior to Admission medications   Medication Sig Start Date End Date Taking? Authorizing Provider  ALPRAZolam Duanne Moron) 1 MG tablet Take 1 mg by mouth 2 (two) times daily as needed. Anxiety     Yes Historical Provider, MD  Cyanocobalamin (VITAMIN B-12 IJ) Inject 1 each as directed every 30 (thirty) days.     Yes Historical Provider, MD  DEXILANT 60 MG capsule Take 60 mg by mouth daily. 02/25/15  Yes Historical Provider, MD  divalproex (DEPAKOTE) 250 MG DR tablet Take 1 tablet (250 mg total) by mouth at bedtime. 05/21/14  Yes Penni Bombard, MD  ergocalciferol (VITAMIN D2) 50000 UNITS capsule Take 50,000 Units by mouth once a week.     Yes Historical Provider, MD  ferrous sulfate 325 (65 FE) MG tablet Take 325 mg by mouth daily with breakfast.     Yes Historical Provider, MD  fexofenadine (ALLEGRA) 180 MG tablet Take 180 mg by mouth daily as needed. allergies    Yes Historical Provider, MD  Multiple Vitamins-Minerals (MULTIVITAMIN WITH MINERALS) tablet Take 1 tablet by mouth daily.     Yes Historical Provider, MD  topiramate (TOPAMAX) 50 MG tablet take 1 tablet by mouth every morning and take 2 tablets by mouth every evening 03/21/15  Yes Penni Bombard, MD  venlafaxine  XR (EFFEXOR-XR) 150 MG 24 hr capsule Take 150 mg by mouth daily.   Yes Historical Provider, MD  zolmitriptan (ZOMIG-ZMT) 5 MG disintegrating tablet PLACE 1 TABLET BY MOUTH ON TONGUE AT ONSET OF HEADACHE; MAY REPEAT x 1 AFTER 2 HOURS; MAX 2 TABS IN 24 HOURS 05/21/14  Yes Penni Bombard, MD  lansoprazole (PREVACID) 30 MG capsule Take 30 mg by mouth daily as needed. Acid reflux      Historical Provider, MD   BP 121/71 mmHg  Pulse 82  Temp(Src) 97.7 F (36.5 C) (Oral)  Resp 18  Ht 5\' 3"  (1.6 m)  Wt 111 lb 1.6 oz (50.395 kg)  BMI 19.69 kg/m2  SpO2 100%  Vitals reviewed Physical Exam  Physical Examination: General appearance - alert, well appearing, and in no distress Mental status - alert, oriented to person, place, and time Eyes - no conjunctival injection, no scleral icterus Mouth - mucous membranes moist, pharynx normal without lesions Chest - clear to auscultation, no wheezes, rales or rhonchi, symmetric air entry Heart - normal rate, regular rhythm, normal S1, S2, no murmurs, rubs, clicks or gallops Abdomen - soft, epigastric tenderness to palpation, no gaurding or rebound, nondistended, no masses or organomegaly Extremities - peripheral pulses normal, no pedal edema, no clubbing or cyanosis Skin - normal coloration and turgor, no rashes Psych- anxious  ED Course  Procedures (including critical care time) Labs Review Labs Reviewed  CBC WITH DIFFERENTIAL/PLATELET - Abnormal; Notable for the following:    Hemoglobin 10.9 (*)    HCT 35.0 (*)    All other components within normal limits  BASIC METABOLIC PANEL - Abnormal; Notable for the following:    Potassium 2.8 (*)    Chloride 99 (*)    Glucose, Bld 118 (*)    Calcium 8.7 (*)    All other components within normal limits  URINALYSIS, ROUTINE W REFLEX MICROSCOPIC - Abnormal; Notable for the following:    Color, Urine AMBER (*)    APPearance CLOUDY (*)    Bilirubin Urine SMALL (*)    All other components within normal  limits  BASIC METABOLIC PANEL - Abnormal; Notable for the following:    Glucose, Bld 110 (*)    Calcium 8.2 (*)    All other components within normal limits  HEPATIC FUNCTION PANEL - Abnormal; Notable for the following:    Total Protein 6.0 (*)    Albumin 3.2 (*)    ALT 8 (*)    All other components within normal limits  CBC WITH DIFFERENTIAL/PLATELET - Abnormal; Notable for  the following:    Hemoglobin 10.7 (*)    HCT 34.7 (*)    Neutrophils Relative % 35 (*)    Lymphocytes Relative 52 (*)    All other components within normal limits  PREGNANCY, URINE  MAGNESIUM    Imaging Review No results found.   EKG Interpretation None      MDM   Final diagnoses:  Hypokalemia  Nausea     9:17 PM pt adamant that we speak with Dr. Paulita Fujita, per patient he is planning to admit her to the hospital.  I have paged - no call back after 45 minutes, repeat paging now.  Potassium being repleted and IV fluids.  Also pt c/o migraine headache.  Given reglan and benadryl for these symptoms.  Will avoid toradol due to GI side effect.   9:46 PM d/w triad for admission.  Pt to go to telemetry bed.   11:14 PM have not received call back from GI, Dr. Paulita Fujita, patient has ready bed on the floor.   Alfonzo Beers, MD 04/18/15 [phone]

## 2015-04-17 ENCOUNTER — Encounter (HOSPITAL_COMMUNITY): Payer: Self-pay | Admitting: Internal Medicine

## 2015-04-17 DIAGNOSIS — G43119 Migraine with aura, intractable, without status migrainosus: Secondary | ICD-10-CM | POA: Diagnosis not present

## 2015-04-17 DIAGNOSIS — K311 Adult hypertrophic pyloric stenosis: Secondary | ICD-10-CM | POA: Diagnosis not present

## 2015-04-17 DIAGNOSIS — R531 Weakness: Secondary | ICD-10-CM | POA: Diagnosis present

## 2015-04-17 DIAGNOSIS — Z681 Body mass index (BMI) 19 or less, adult: Secondary | ICD-10-CM | POA: Diagnosis not present

## 2015-04-17 DIAGNOSIS — E876 Hypokalemia: Secondary | ICD-10-CM | POA: Diagnosis not present

## 2015-04-17 DIAGNOSIS — Z882 Allergy status to sulfonamides status: Secondary | ICD-10-CM | POA: Diagnosis not present

## 2015-04-17 DIAGNOSIS — E43 Unspecified severe protein-calorie malnutrition: Secondary | ICD-10-CM | POA: Diagnosis not present

## 2015-04-17 DIAGNOSIS — Z886 Allergy status to analgesic agent status: Secondary | ICD-10-CM | POA: Diagnosis not present

## 2015-04-17 DIAGNOSIS — F419 Anxiety disorder, unspecified: Secondary | ICD-10-CM | POA: Diagnosis present

## 2015-04-17 DIAGNOSIS — Z833 Family history of diabetes mellitus: Secondary | ICD-10-CM | POA: Diagnosis not present

## 2015-04-17 DIAGNOSIS — K219 Gastro-esophageal reflux disease without esophagitis: Secondary | ICD-10-CM | POA: Diagnosis present

## 2015-04-17 DIAGNOSIS — Z888 Allergy status to other drugs, medicaments and biological substances status: Secondary | ICD-10-CM | POA: Diagnosis not present

## 2015-04-17 DIAGNOSIS — G43109 Migraine with aura, not intractable, without status migrainosus: Secondary | ICD-10-CM | POA: Diagnosis present

## 2015-04-17 DIAGNOSIS — F329 Major depressive disorder, single episode, unspecified: Secondary | ICD-10-CM | POA: Diagnosis present

## 2015-04-17 DIAGNOSIS — Z9884 Bariatric surgery status: Secondary | ICD-10-CM | POA: Diagnosis not present

## 2015-04-17 DIAGNOSIS — Z8249 Family history of ischemic heart disease and other diseases of the circulatory system: Secondary | ICD-10-CM | POA: Diagnosis not present

## 2015-04-17 DIAGNOSIS — Z79899 Other long term (current) drug therapy: Secondary | ICD-10-CM | POA: Diagnosis not present

## 2015-04-17 DIAGNOSIS — Z9049 Acquired absence of other specified parts of digestive tract: Secondary | ICD-10-CM | POA: Diagnosis present

## 2015-04-17 DIAGNOSIS — Z87442 Personal history of urinary calculi: Secondary | ICD-10-CM | POA: Diagnosis not present

## 2015-04-17 DIAGNOSIS — Z823 Family history of stroke: Secondary | ICD-10-CM | POA: Diagnosis not present

## 2015-04-17 LAB — CBC WITH DIFFERENTIAL/PLATELET
BASOS ABS: 0 10*3/uL (ref 0.0–0.1)
Basophils Relative: 1 % (ref 0–1)
EOS ABS: 0.2 10*3/uL (ref 0.0–0.7)
EOS PCT: 4 % (ref 0–5)
HEMATOCRIT: 34.7 % — AB (ref 36.0–46.0)
HEMOGLOBIN: 10.7 g/dL — AB (ref 12.0–15.0)
LYMPHS ABS: 3.2 10*3/uL (ref 0.7–4.0)
LYMPHS PCT: 52 % — AB (ref 12–46)
MCH: 26.4 pg (ref 26.0–34.0)
MCHC: 30.8 g/dL (ref 30.0–36.0)
MCV: 85.7 fL (ref 78.0–100.0)
MONO ABS: 0.5 10*3/uL (ref 0.1–1.0)
Monocytes Relative: 8 % (ref 3–12)
NEUTROS ABS: 2.1 10*3/uL (ref 1.7–7.7)
Neutrophils Relative %: 35 % — ABNORMAL LOW (ref 43–77)
Platelets: 289 10*3/uL (ref 150–400)
RBC: 4.05 MIL/uL (ref 3.87–5.11)
RDW: 14.7 % (ref 11.5–15.5)
WBC: 6 10*3/uL (ref 4.0–10.5)

## 2015-04-17 LAB — HEPATIC FUNCTION PANEL
ALBUMIN: 3.2 g/dL — AB (ref 3.5–5.0)
ALT: 8 U/L — AB (ref 14–54)
AST: 16 U/L (ref 15–41)
Alkaline Phosphatase: 41 U/L (ref 38–126)
BILIRUBIN TOTAL: 0.6 mg/dL (ref 0.3–1.2)
Bilirubin, Direct: 0.2 mg/dL (ref 0.1–0.5)
Indirect Bilirubin: 0.4 mg/dL (ref 0.3–0.9)
TOTAL PROTEIN: 6 g/dL — AB (ref 6.5–8.1)

## 2015-04-17 LAB — BASIC METABOLIC PANEL
Anion gap: 10 (ref 5–15)
BUN: 7 mg/dL (ref 6–20)
CO2: 27 mmol/L (ref 22–32)
CREATININE: 0.75 mg/dL (ref 0.44–1.00)
Calcium: 8.2 mg/dL — ABNORMAL LOW (ref 8.9–10.3)
Chloride: 105 mmol/L (ref 101–111)
GFR calc non Af Amer: >60 mL/min (ref >60)
Glucose, Bld: 110 mg/dL — ABNORMAL HIGH (ref 65–99)
Potassium: 3.7 mmol/L (ref 3.5–5.1)
SODIUM: 142 mmol/L (ref 135–145)

## 2015-04-17 LAB — MAGNESIUM: Magnesium: 2.1 mg/dL (ref 1.7–2.4)

## 2015-04-17 MED ORDER — ACETAMINOPHEN 650 MG RE SUPP: 650.0000 mg | Freq: Four times a day (QID) | RECTAL | Status: DC | PRN

## 2015-04-17 MED ORDER — POTASSIUM CHLORIDE IN NACL 20-0.9 MEQ/L-% IV SOLN
INTRAVENOUS | Status: AC
  Administered 2015-04-17 (×2): via INTRAVENOUS
  Filled 2015-04-17 (×2): qty 1000

## 2015-04-17 MED ORDER — VALPROATE SODIUM 500 MG/5ML IV SOLN
500.0000 mg | Freq: Once | INTRAVENOUS | Status: AC
  Administered 2015-04-17: 500 mg via INTRAVENOUS
  Filled 2015-04-17: qty 5

## 2015-04-17 MED ORDER — ENOXAPARIN SODIUM 40 MG/0.4ML ~~LOC~~ SOLN
40.0000 mg | SUBCUTANEOUS | Status: DC
  Administered 2015-04-17 – 2015-04-18 (×2): 40 mg via SUBCUTANEOUS
  Filled 2015-04-17 (×2): qty 0.4

## 2015-04-17 MED ORDER — ALPRAZOLAM 1 MG PO TABS
1.0000 mg | ORAL_TABLET | Freq: Two times a day (BID) | ORAL | Status: DC | PRN
  Administered 2015-04-17 – 2015-04-18 (×2): 1 mg via ORAL
  Filled 2015-04-17 (×2): qty 1

## 2015-04-17 MED ORDER — LORATADINE 10 MG PO TABS
10.0000 mg | ORAL_TABLET | ORAL | Status: DC
  Administered 2015-04-17 – 2015-04-18 (×2): 10 mg via ORAL
  Filled 2015-04-17 (×2): qty 1

## 2015-04-17 MED ORDER — PANTOPRAZOLE SODIUM 40 MG IV SOLR
40.0000 mg | Freq: Two times a day (BID) | INTRAVENOUS | Status: DC
  Administered 2015-04-17 – 2015-04-18 (×4): 40 mg via INTRAVENOUS
  Filled 2015-04-17 (×4): qty 40

## 2015-04-17 MED ORDER — ENSURE ENLIVE PO LIQD
237.0000 mL | Freq: Two times a day (BID) | ORAL | Status: DC
  Administered 2015-04-17: 237 mL via ORAL

## 2015-04-17 MED ORDER — ADULT MULTIVITAMIN W/MINERALS CH
1.0000 | ORAL_TABLET | ORAL | Status: DC
  Administered 2015-04-17 – 2015-04-18 (×2): 1 via ORAL
  Filled 2015-04-17 (×2): qty 1

## 2015-04-17 MED ORDER — FERROUS SULFATE 325 (65 FE) MG PO TABS
325.0000 mg | ORAL_TABLET | Freq: Every day | ORAL | Status: DC
  Administered 2015-04-17 – 2015-04-18 (×2): 325 mg via ORAL
  Filled 2015-04-17 (×2): qty 1

## 2015-04-17 MED ORDER — ERGOCALCIFEROL 1.25 MG (50000 UT) PO CAPS
50000.0000 [IU] | ORAL_CAPSULE | ORAL | Status: DC
  Administered 2015-04-17: 50000 [IU] via ORAL
  Filled 2015-04-17: qty 1

## 2015-04-17 MED ORDER — VENLAFAXINE HCL ER 150 MG PO CP24
150.0000 mg | ORAL_CAPSULE | ORAL | Status: DC
  Administered 2015-04-17 – 2015-04-18 (×2): 150 mg via ORAL
  Filled 2015-04-17 (×2): qty 1

## 2015-04-17 MED ORDER — ACETAMINOPHEN 325 MG PO TABS: 650.0000 mg | ORAL_TABLET | Freq: Four times a day (QID) | ORAL | Status: DC | PRN

## 2015-04-17 MED ORDER — DIVALPROEX SODIUM 250 MG PO DR TAB
250.0000 mg | DELAYED_RELEASE_TABLET | Freq: Every day | ORAL | Status: DC
  Administered 2015-04-17: 250 mg via ORAL
  Filled 2015-04-17 (×2): qty 1

## 2015-04-17 MED ORDER — ONDANSETRON HCL 4 MG PO TABS: 4.0000 mg | ORAL_TABLET | Freq: Four times a day (QID) | ORAL | Status: DC | PRN

## 2015-04-17 MED ORDER — ONDANSETRON HCL 4 MG/2ML IJ SOLN
4.0000 mg | Freq: Four times a day (QID) | INTRAMUSCULAR | Status: DC | PRN
  Administered 2015-04-17 – 2015-04-18 (×3): 4 mg via INTRAVENOUS
  Filled 2015-04-17 (×3): qty 2

## 2015-04-17 MED ORDER — PRO-STAT SUGAR FREE PO LIQD
30.0000 mL | Freq: Two times a day (BID) | ORAL | Status: DC
  Administered 2015-04-17 – 2015-04-18 (×2): 30 mL via ORAL
  Filled 2015-04-17 (×2): qty 30

## 2015-04-17 MED ORDER — TOPIRAMATE 100 MG PO TABS
100.0000 mg | ORAL_TABLET | Freq: Every day | ORAL | Status: DC
  Administered 2015-04-17: 100 mg via ORAL
  Filled 2015-04-17: qty 1

## 2015-04-17 MED ORDER — SUMATRIPTAN SUCCINATE 50 MG PO TABS
50.0000 mg | ORAL_TABLET | ORAL | Status: DC | PRN
  Administered 2015-04-17: 50 mg via ORAL
  Filled 2015-04-17 (×3): qty 1

## 2015-04-17 NOTE — Progress Notes (Signed)
Pt vomited undigested ice cream and sherbet from lunch into toilet. Denies nausea, states "it just came back up." Will continue to monitor pt.

## 2015-04-17 NOTE — Care Management Note (Signed)
Case Management Note  Patient Details  Name: Megan Davila MRN: [id number] Date of Birth: 17-Feb-1966  Subjective/Objective: 49 y/o f admitted w/hypokalmia,n/v.Hx:GJ anastomotic stricture.                  Action/Plan:From home.   Expected Discharge Date:   (unknown)               Expected Discharge Plan:  Home/Self Care  In-House Referral:     Discharge planning Services  CM Consult  Post Acute Care Choice:    Choice offered to:     DME Arranged:    DME Agency:     HH Arranged:    HH Agency:     Status of Service:  In process, will continue to follow  Medicare Important Message Given:    Date Medicare IM Given:    Medicare IM give by:    Date Additional Medicare IM Given:    Additional Medicare Important Message give by:     If discussed at [place] of Stay Meetings, dates discussed:    Additional Comments:  Dessa Phi, RN 04/17/2015, 1:09 PM

## 2015-04-17 NOTE — Progress Notes (Signed)
[facility] PROGRESS NOTE   Assessment/Plan: Gastric outlet obstruction: - I will continue liquid diet, however a consulted GI for further management. - She relates she gets full release [facility].  GERD (gastroesophageal reflux disease) - Continue PPI IV.    Hypokalemia: - Potassium is improving with IV potassium. We'll continue to monitor. - Due to poor oral intake.       Code Status: full Family Communication: mother Disposition Plan: home 2 dyas   Consultants:  GI  Procedures: ABd x-ray 5.4.2016: Very narrowed anastomosis of the gastric pouch with small bowel resulting in considerable delay in passage of barium   Antibiotics:  None  HPI/Subjective: She is complaining of GERD.  Objective: Filed Vitals:   04/16/15 1723 04/16/15 2104 04/16/15 2349 04/17/15 0433  BP: 135/91 113/75 134/80 99/65  Pulse: 115 81 78 84  Temp: 98.4 F (36.9 C)  97.9 F (36.6 C) 98.3 F (36.8 C)  TempSrc: Oral  Oral Oral  Resp: 18 18 18 18   Height:   5\' 3"  (1.6 m)   Weight:   50.395 kg (111 lb 1.6 oz)   SpO2: 96% 100% 100% 100%    Intake/Output Summary (Last 24 hours) at 04/17/15 1047 Last data filed at 04/17/15 0821  Gross per 24 hour  Intake 641.25 ml  Output    700 ml  Net -58.75 ml   Filed Weights   04/16/15 2349  Weight: 50.395 kg (111 lb 1.6 oz)    Exam:  General: Alert, awake, oriented x3, in no acute distress.  HEENT: No bruits, no goiter.  Heart: Regular rate and rhythm. Lungs: Good air movement, clear Abdomen: Soft, nontender, nondistended, positive bowel sounds.  Neuro: Grossly intact, nonfocal.   Data Reviewed: Basic Metabolic Panel:  Recent Labs Lab 04/16/15 1814 04/17/15 0420  NA 136 142  K 2.8* 3.7  CL 99* 105  CO2 29 27  GLUCOSE 118* 110*  BUN 10 7  CREATININE 0.85 0.75  CALCIUM 8.7* 8.2*  MG  --  2.1   Liver Function Tests:  Recent Labs Lab 04/17/15 0420  AST 16  ALT 8*  ALKPHOS 41  BILITOT 0.6  PROT 6.0*  ALBUMIN  3.2*   No results for input(s): LIPASE, AMYLASE in the last 168 hours. No results for input(s): AMMONIA in the last 168 hours. CBC:  Recent Labs Lab 04/16/15 1814 04/17/15 0420  WBC 6.1 6.0  NEUTROABS 3.0 2.1  HGB 10.9* 10.7*  HCT 35.0* 34.7*  MCV 85.0 85.7  PLT 343 289   Cardiac Enzymes: No results for input(s): CKTOTAL, CKMB, CKMBINDEX, TROPONINI in the last 168 hours. BNP (last 3 results) No results for input(s): BNP in the last 8760 hours.  ProBNP (last 3 results) No results for input(s): PROBNP in the last 8760 hours.  CBG: No results for input(s): GLUCAP in the last 168 hours.  No results found for this or any previous visit (from the past 240 hour(s)).   Studies: No results found.  Scheduled Meds: . divalproex  250 mg Oral QHS  . enoxaparin (LOVENOX) injection  40 mg Subcutaneous Q24H  . ergocalciferol  50,000 Units Oral Weekly  . ferrous sulfate  325 mg Oral Q breakfast  . loratadine  10 mg Oral Q24H  . multivitamin with minerals  1 tablet Oral Q24H  . pantoprazole (PROTONIX) IV  40 mg Intravenous Q12H  . topiramate  100 mg Oral QHS  . venlafaxine XR  150 mg Oral Q24H   Continuous Infusions: .  0.9 % NaCl with KCl 20 mEq / L 75 mL/hr at 04/17/15 0159    Time Spent: 25 min   Charlynne Cousins  [facility] Pager [phone]. If 7PM-7AM, please contact night-coverage at [email or web address], password [email or web address] 04/17/2015, 10:47 AM  LOS: 0 days

## 2015-04-17 NOTE — Progress Notes (Signed)
Initial Nutrition Assessment  DOCUMENTATION CODES:  Severe malnutrition in context of chronic illness  INTERVENTION:  Ensure Enlive (each supplement provides 350kcal and 20 grams of protein), Prostat BID  NUTRITION DIAGNOSIS:  Inadequate oral intake related to chronic illness as evidenced by moderate depletion of body fat, moderate depletions of muscle mass.  GOAL:  Patient will meet greater than or equal to 90% of their needs  MONITOR:  PO intake, Supplement acceptance, Diet advancement, Labs, Weight trends, I & O's  REASON FOR ASSESSMENT:  Malnutrition Screening Tool    ASSESSMENT: Pt is a 49 y.o. female with history of gastric bypass in 2002 was referred to the ER by patient's gastroenterologist after patient has been feeling weak. Patient states that over the last few months patient has been unable to eat as she usually does and has been having some regurgitation symptoms. Patient had upper GI series done last week which showed narrowing of the gastric outlet stoma.   Pt eating a smoothie her brother brought at time of visit. Pt states she cannot eat large meals due to bypass. Diet recall shows pt eating several small meals and snacks a day. She had poor appetite last several months and wt history shows  11 Lb weight loss (10% in 6 months, significant for time frame). Per pt she likes to eat healthy foods, but even her doctor told her to eat what ever she wants to gain back some of her weight. Provided pt with some tips to increase calorie content of foods. Pt states she is not a big fan of milkshakes, but dues drink Ensure at home and would like to have Prostat. Both have been ordered BID. Pt appeared to be concerned about gaining weight, reassured pt that focus should be on health and gaining some weight would be beneficial. Pt very grateful for the visit and information. Will continue to monitor.   Labs reviewed: Ca 8.2, Glu 110  Height:  Ht Readings from Last 1 Encounters:   04/16/15 5\' 3"  (1.6 m)    Weight:  Wt Readings from Last 1 Encounters:  04/16/15 111 lb 1.6 oz (50.395 kg)    Ideal Body Weight:  52 kg  Wt Readings from Last 10 Encounters:  04/16/15 111 lb 1.6 oz (50.395 kg)  10/09/14 122 lb (55.339 kg)  05/21/14 125 lb (56.7 kg)  05/14/14 126 lb 3.2 oz (57.244 kg)  12/12/13 131 lb 8 oz (59.648 kg)  11/14/13 131 lb 8 oz (59.648 kg)  10/24/13 126 lb 8 oz (57.38 kg)  10/17/13 130 lb (58.968 kg)  10/07/13 130 lb 8 oz (59.194 kg)    BMI:  Body mass index is 19.69 kg/(m^2).  Estimated Nutritional Needs:  Kcal:  1600 - 1800  Protein:  65 - 80 g  Fluid:  2.0 L  Skin:  Reviewed, no issues  Diet Order:  DIET - DYS 1 Room service appropriate?: Yes; Fluid consistency:: Thin  EDUCATION NEEDS:  Education needs addressed   Intake/Output Summary (Last 24 hours) at 04/17/15 1444 Last data filed at 04/17/15 1332  Gross per 24 hour  Intake 1361.25 ml  Output   1300 ml  Net  61.25 ml    Last BM:  5/11  Asiel Chrostowski A. [facility]ell Dietetic Intern Pager: [phone] 04/17/2015 2:52 PM

## 2015-04-17 NOTE — Consult Note (Signed)
[facility] Consultation Note  Referring Provider:  Dr. Charlynne Cousins [facility]) Primary Care Physician:  Reginia Naas, MD Primary Gastroenterologist:  Dr. Arta Silence  Reason for Consultation:  Nausea, vomiting, weight loss  HPI: Megan Davila is a 49 y.o. female admitted for nausea, vomiting, weight loss.  History of gastric bypass in 2002 lost from 270 to about 120.  Over the past several weeks, weight loss further to about 105.  She has some abdominal soreness but no abdominal pain.  Unable to tolerate diet at home.  She had endoscopy and UGI series which have showed tight stricture of gastrojejunostomy anastomosis.  Has GERD symptoms and regurgitation, without clear improvement with PPIs.  No blood in stool.  No NSAIDs.   Past Medical History  Diagnosis Date  . Heart murmur   . GERD (gastroesophageal reflux disease)   . Headache(784.0)   . Anxiety   . Depression   . PONV (postoperative nausea and vomiting)   . Anemia   . History of kidney stones   . Abnormal Pap smear     Past Surgical History  Procedure Laterality Date  . Gastric bypass  2002  . Cholecystectomy    . Tonsillectomy    . Lasik    . Nose surgery    . Hysteroscopy w/d&c  10/14/2011    Procedure: DILATATION AND CURETTAGE (D&C) /HYSTEROSCOPY;  Surgeon: Arloa Koh;  Location: [facility];  Service: Gynecology;  Laterality: Bilateral;  . Tonsillectomy    . 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 Cesarean section  2008  . Tubal ligation  2008  . Cervical biopsy  w/ loop electrode excision  11/2013    LGSIL    Prior to Admission medications   Medication Sig Start Date End Date Taking? Authorizing Provider  ALPRAZolam Duanne Moron) 1 MG tablet Take 1 mg by mouth 2 (two) times daily as needed. Anxiety     Yes Historical Provider, MD  Cyanocobalamin (VITAMIN B-12 IJ) Inject 1 each as directed every 30 (thirty) days.     Yes Historical Provider, MD  DEXILANT 60  MG capsule Take 60 mg by mouth daily. 02/25/15  Yes Historical Provider, MD  divalproex (DEPAKOTE) 250 MG DR tablet Take 1 tablet (250 mg total) by mouth at bedtime. 05/21/14  Yes Penni Bombard, MD  ergocalciferol (VITAMIN D2) 50000 UNITS capsule Take 50,000 Units by mouth once a week.     Yes Historical Provider, MD  ferrous sulfate 325 (65 FE) MG tablet Take 325 mg by mouth daily with breakfast.     Yes Historical Provider, MD  fexofenadine (ALLEGRA) 180 MG tablet Take 180 mg by mouth daily as needed. allergies    Yes Historical Provider, MD  Multiple Vitamins-Minerals (MULTIVITAMIN WITH MINERALS) tablet Take 1 tablet by mouth daily.     Yes Historical Provider, MD  topiramate (TOPAMAX) 50 MG tablet take 1 tablet by mouth every morning and take 2 tablets by mouth every evening 03/21/15  Yes Penni Bombard, MD  venlafaxine XR (EFFEXOR-XR) 150 MG 24 hr capsule Take 150 mg by mouth daily.   Yes Historical Provider, MD  zolmitriptan (ZOMIG-ZMT) 5 MG disintegrating tablet PLACE 1 TABLET BY MOUTH ON TONGUE AT ONSET OF HEADACHE; MAY REPEAT x 1 AFTER 2 HOURS; MAX 2 TABS IN 24 HOURS 05/21/14  Yes Penni Bombard, MD  lansoprazole (PREVACID) 30 MG capsule Take 30 mg by mouth daily as needed. Acid reflux  Historical Provider, MD    Current Facility-Administered Medications  Medication Dose Route Frequency Provider Last Rate Last Dose  . 0.9 % NaCl with KCl 20 mEq/ L  infusion   Intravenous Continuous Rise Patience, MD 75 mL/hr at 04/17/15 0159    . acetaminophen (TYLENOL) tablet 650 mg  650 mg Oral Q6H PRN Rise Patience, MD       Or  . acetaminophen (TYLENOL) suppository 650 mg  650 mg Rectal Q6H PRN Rise Patience, MD      . ALPRAZolam Duanne Moron) tablet 1 mg  1 mg Oral BID PRN Rise Patience, MD      . divalproex (DEPAKOTE) DR tablet 250 mg  250 mg Oral QHS Rise Patience, MD   250 mg at 04/17/15 0205  . enoxaparin (LOVENOX) injection 40 mg  40 mg Subcutaneous Q24H  Rise Patience, MD   40 mg at 04/17/15 0817  . ergocalciferol (VITAMIN D2) capsule 50,000 Units  50,000 Units Oral Weekly Rise Patience, MD      . ferrous sulfate tablet 325 mg  325 mg Oral Q breakfast Rise Patience, MD   325 mg at 04/17/15 0816  . loratadine (CLARITIN) tablet 10 mg  10 mg Oral Q24H Rise Patience, MD      . multivitamin with minerals tablet 1 tablet  1 tablet Oral Q24H Rise Patience, MD      . ondansetron [facility]) tablet 4 mg  4 mg Oral Q6H PRN Rise Patience, MD       Or  . ondansetron [facility]) injection 4 mg  4 mg Intravenous Q6H PRN Rise Patience, MD   4 mg at 04/17/15 0817  . pantoprazole (PROTONIX) injection 40 mg  40 mg Intravenous Q12H Rise Patience, MD   40 mg at 04/17/15 0953  . SUMAtriptan (IMITREX) tablet 50 mg  50 mg Oral PRN Rise Patience, MD      . topiramate (TOPAMAX) tablet 100 mg  100 mg Oral QHS Rise Patience, MD      . venlafaxine XR (EFFEXOR-XR) 24 hr capsule 150 mg  150 mg Oral Q24H Rise Patience, MD        Allergies as of 04/16/2015 - Review Complete 04/16/2015  Allergen Reaction Noted  . Naproxen Anaphylaxis 09/30/2011  . Monistat [miconazole] Other (See Comments) 10/16/2013  . Sulfa antibiotics Hives 09/30/2011    Family History  Problem Relation Age of Onset  . Diabetes Mother   . Heart disease Father   . Social phobia Father   . Psychiatric Illness Father   . Hypertension Father   . Stroke Father   . Heart disease    . Diabetes    . Stroke    . Hypertension Maternal Grandmother   . Thyroid disease Maternal Grandmother   . Hypertension Paternal Grandmother   . Stroke Paternal Grandfather   . Dementia Father     History   Social History  . Marital Status: Divorced    Spouse Name: N/A  . Number of Children: 1  . Years of Education: College   Occupational History  .  [employer]   Social History Main Topics  . Smoking status: Never Smoker   . Smokeless  tobacco: Never Used  . Alcohol Use: Yes     Comment: Once a month  . Drug Use: No  . Sexual Activity:    Partners: Male    Birth Control/ Protection: Surgical  Comment: tubal   Other Topics Concern  . Not on file   Social History Narrative   Pt lives at home with her family.   Caffeine Use: once daily    Review of Systems: Positive = bold Gen: Denies any fever, chills, rigors, night sweats, anorexia, fatigue, weakness, malaise, involuntary weight loss, and sleep disorder CV: Denies chest pain, angina, palpitations, syncope, orthopnea, PND, peripheral edema, and claudication. Resp: Denies dyspnea, cough, sputum, wheezing, coughing up blood. GI: Described in detail in HPI.    GU : Denies urinary burning, blood in urine, urinary frequency, urinary hesitancy, nocturnal urination, and urinary incontinence. MS: Denies joint pain or swelling.  Denies muscle weakness, cramps, atrophy.  Derm: Denies rash, itching, oral ulcerations, hives, unhealing ulcers.  Psych: Denies depression, anxiety, memory loss, suicidal ideation, hallucinations,  and confusion. Heme: Denies bruising, bleeding, and enlarged lymph nodes. Neuro:  Denies any headaches, dizziness, paresthesias. Endo:  Denies any problems with DM, thyroid, adrenal function.  Physical Exam: Vital signs in last 24 hours: Temp:  [97.9 F (36.6 C)-98.4 F (36.9 C)] 98.3 F (36.8 C) (05/13 0433) Pulse Rate:  [78-115] 84 (05/13 0433) Resp:  [18] 18 (05/13 0433) BP: (99-135)/(65-91) 99/65 mmHg (05/13 0433) SpO2:  [96 %-100 %] 100 % (05/13 0433) Weight:  [50.395 kg (111 lb 1.6 oz)] 50.395 kg (111 lb 1.6 oz) (05/12 2349) Last BM Date: 04/15/15 General:   Alert,  Well-developed, well-nourished, thin and somewhat gaunt-appearing, but otherwise pleasant and cooperative in NAD Head:  Normocephalic and atraumatic. Eyes:  Sclera clear, no icterus.   Conjunctiva pink. Ears:  Normal auditory acuity. Nose:  No deformity, discharge,  or  lesions. Mouth:  No deformity or lesions.  Oropharynx pink but somewhat dry Neck:  Supple; no masses or thyromegaly. Lungs:  Clear throughout to auscultation.   No wheezes, crackles, or rhonchi. No acute distress. Heart:  Regular rate and rhythm; no murmurs, clicks, rubs,  or gallops. Abdomen:  Soft, nontender and nondistended. No masses, hepatosplenomegaly or hernias noted. Normal bowel sounds, without guarding, and without rebound.     Msk:  Symmetrical without gross deformities. Normal posture. Pulses:  Normal pulses noted. Extremities:  Without clubbing or edema. Neurologic:  Alert and  oriented x4;  Diffusely weak, otherwise grossly normal neurologically. Skin:  Intact without significant lesions or rashes. Psych:  Alert and cooperative. Normal mood and affect.   Lab Results:  Recent Labs  04/16/15 1814 04/17/15 0420  WBC 6.1 6.0  HGB 10.9* 10.7*  HCT 35.0* 34.7*  PLT 343 289   BMET  Recent Labs  04/16/15 1814 04/17/15 0420  NA 136 142  K 2.8* 3.7  CL 99* 105  CO2 29 27  GLUCOSE 118* 110*  BUN 10 7  CREATININE 0.85 0.75  CALCIUM 8.7* 8.2*   LFT  Recent Labs  04/17/15 0420  PROT 6.0*  ALBUMIN 3.2*  AST 16  ALT 8*  ALKPHOS 41  BILITOT 0.6  BILIDIR 0.2  IBILI 0.4   PT/INR No results for input(s): LABPROT, INR in the last 72 hours.  Studies/Results: No results found.  Impression:  1.  Nausea and vomiting.  Suspect due to gastrojejunostomy anastomotic stricture. 2.  Gastric bypass with gastrojejunostomy anastomosis. 3.  Weight loss, ongoing despite 10+ years since her gastric bypass.  Suspect due to #2 above.  Plan:  1.  Intravenous fluids. 2.  Trial of pureed-type diet. 3.  If we can have patient tolerate pureed diet, maybe discharge in the next day  or two.   4.  I have discussed case at length with Dr. Cammie Sickle (who did her gastric bypass in 2002). 5.  If patient able to be discharged, she can follow-up with Dr. Margart Sickles for consultation  and endoscopy on Monday as outpatient.  If she doesn't tolerate diet, then we will keep her hospitalized and would consider hospital-to-hospital transfer to [facility], waiting list for which per Dr. Margart Sickles would likely be next Tuesday. 6.  Case discussed with Dr. Olevia Bowens of hospitalist team.    LOS: 0 days   Landry Dyke  04/17/2015, 12:02 PM  Pager [phone] If no answer or after 5 PM call [phone]

## 2015-04-17 NOTE — Progress Notes (Signed)
Pt vomited undigested food after eating dinner of pureed mashed potatoes, gravy, and pork loin. She did successfully keep down a smoothie brought in by her brother earlier this afternoon.

## 2015-04-17 NOTE — H&P (Signed)
[facility] History and Physical  [name] JAS:[id number] DOB: 10/31/1966 DOA: 04/16/2015  Referring physician: Dr. Canary Brim. PCP: Reginia Naas, MD  Specialists: Dr. Paulita Fujita. Gastroenterologist.  Chief Complaint: Weakness.  HPI: Megan Davila is a 49 y.o. female with history of gastric bypass in 2002 was referred to the ER by patient's gastroenterologist after patient has been feeling weak. Patient states that over the last few months patient has been unable to eat as she usually does and has been having some regurgitation symptoms. Patient had upper GI series done last week which showed narrowing of the gastric outlet stoma. Patient is following up with gastroenterologist Dr. Paulita Fujita and since patient has had worsening symptoms and weakness was instructed to come to the ER. In the ER patient's blood work shows significant hypokalemia and patient has been admitted for further management. In addition patient has been stating that she has been having headache with bilateral retro-orbital pain typical of her migraine over the last 2 days. Patient was given Reglan and Benadryl in the ER despite which patient still has headache.  Review of Systems: As presented in the history of presenting illness, rest negative.  Past Medical History  Diagnosis Date  . Heart murmur   . GERD (gastroesophageal reflux disease)   . Headache(784.0)   . Anxiety   . Depression   . PONV (postoperative nausea and vomiting)   . Anemia   . History of kidney stones   . Abnormal Pap smear    Past Surgical History  Procedure Laterality Date  . Gastric bypass  2002  . Cholecystectomy    . Tonsillectomy    . Lasik    . Nose surgery    . Hysteroscopy w/d&c  10/14/2011    Procedure: DILATATION AND CURETTAGE (D&C) /HYSTEROSCOPY;  Surgeon: Arloa Koh;  Location: [facility];  Service: Gynecology;  Laterality: Bilateral;  . Tonsillectomy    . 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 Cesarean section  2008  . Tubal ligation  2008  . Cervical biopsy  w/ loop electrode excision  11/2013    LGSIL   Social History:  reports that she has never smoked. She has never used smokeless tobacco. She reports that she drinks alcohol. She reports that she does not use illicit drugs. Where does patient live home. Can patient participate in ADLs? Yes.  Allergies  Allergen Reactions  . Naproxen Anaphylaxis  . Monistat [Miconazole] Other (See Comments)    "burning"   . Sulfa Antibiotics Hives    Family History:  Family History  Problem Relation Age of Onset  . Diabetes Mother   . Heart disease Father   . Social phobia Father   . Psychiatric Illness Father   . Hypertension Father   . Stroke Father   . Heart disease    . Diabetes    . Stroke    . Hypertension Maternal Grandmother   . Thyroid disease Maternal Grandmother   . Hypertension Paternal Grandmother   . Stroke Paternal Grandfather   . Dementia Father       Prior to Admission medications   Medication Sig Start Date End Date Taking? Authorizing Provider  ALPRAZolam Duanne Moron) 1 MG tablet Take 1 mg by mouth 2 (two) times daily as needed. Anxiety     Yes Historical Provider, MD  Cyanocobalamin (VITAMIN B-12 IJ) Inject 1 each as directed every 30 (thirty) days.     Yes Historical Provider, MD  DEXILANT 60 MG capsule Take 60 mg by mouth daily. 02/25/15  Yes Historical Provider, MD  divalproex (DEPAKOTE) 250 MG DR tablet Take 1 tablet (250 mg total) by mouth at bedtime. 05/21/14  Yes Penni Bombard, MD  ergocalciferol (VITAMIN D2) 50000 UNITS capsule Take 50,000 Units by mouth once a week.     Yes Historical Provider, MD  ferrous sulfate 325 (65 FE) MG tablet Take 325 mg by mouth daily with breakfast.     Yes Historical Provider, MD  fexofenadine (ALLEGRA) 180 MG tablet Take 180 mg by mouth daily as needed. allergies    Yes Historical Provider, MD  Multiple Vitamins-Minerals (MULTIVITAMIN WITH  MINERALS) tablet Take 1 tablet by mouth daily.     Yes Historical Provider, MD  topiramate (TOPAMAX) 50 MG tablet take 1 tablet by mouth every morning and take 2 tablets by mouth every evening 03/21/15  Yes Penni Bombard, MD  venlafaxine XR (EFFEXOR-XR) 150 MG 24 hr capsule Take 150 mg by mouth daily.   Yes Historical Provider, MD  zolmitriptan (ZOMIG-ZMT) 5 MG disintegrating tablet PLACE 1 TABLET BY MOUTH ON TONGUE AT ONSET OF HEADACHE; MAY REPEAT x 1 AFTER 2 HOURS; MAX 2 TABS IN 24 HOURS 05/21/14  Yes Penni Bombard, MD  lansoprazole (PREVACID) 30 MG capsule Take 30 mg by mouth daily as needed. Acid reflux      Historical Provider, MD    Physical Exam: Filed Vitals:   04/16/15 1723 04/16/15 2104 04/16/15 2349  BP: 135/91 113/75 134/80  Pulse: 115 81 78  Temp: 98.4 F (36.9 C)  97.9 F (36.6 C)  TempSrc: Oral  Oral  Resp: 18 18 18   Height:   5\' 3"  (1.6 m)  Weight:   50.395 kg (111 lb 1.6 oz)  SpO2: 96% 100% 100%     General:  Poorly built and poorly nourished.  Eyes: Anicteric no pallor.  ENT: No discharge from ears eyes nose and mouth.  Neck: No mass felt.  Cardiovascular: S1 and S2 heard.  Respiratory: No rhonchi or crepitations.  Abdomen: Soft nontender bowel sounds present.  Skin: No rash.  Musculoskeletal: No edema.  Psychiatric: Appears normal.  Neurologic: Alert awake oriented to time place and person. Moves all extremities.  Labs on Admission:  Basic Metabolic Panel:  Recent Labs Lab 04/16/15 1814  NA 136  K 2.8*  CL 99*  CO2 29  GLUCOSE 118*  BUN 10  CREATININE 0.85  CALCIUM 8.7*   Liver Function Tests: No results for input(s): AST, ALT, ALKPHOS, BILITOT, PROT, ALBUMIN in the last 168 hours. No results for input(s): LIPASE, AMYLASE in the last 168 hours. No results for input(s): AMMONIA in the last 168 hours. CBC:  Recent Labs Lab 04/16/15 1814  WBC 6.1  NEUTROABS 3.0  HGB 10.9*  HCT 35.0*  MCV 85.0  PLT 343   Cardiac  Enzymes: No results for input(s): CKTOTAL, CKMB, CKMBINDEX, TROPONINI in the last 168 hours.  BNP (last 3 results) No results for input(s): BNP in the last 8760 hours.  ProBNP (last 3 results) No results for input(s): PROBNP in the last 8760 hours.  CBG: No results for input(s): GLUCAP in the last 168 hours.  Radiological Exams on Admission: No results found.   Assessment/Plan Active Problems:   Migraine with aura   Hypokalemia   Gastric outlet obstruction   GERD (gastroesophageal reflux disease)   1. Gastric stoma narrowing with poor intake - at this time I have placed patient on  gentle hydration and full liquid diet. Consult Dr. Paulita Fujita in a.m. 2. Hypokalemia probably from poor oral intake - replace and recheck. Check magnesium levels. 3. Migraine - patient has significant headache despite getting Reglan and Benadryl. I have discussed with on-call neurologist Dr. Janann Colonel who advised to give Depacon 500 mg IV 1 dose now. Continue with patient's Depakote and Topamax. 4. GERD - I have placed patient on Protonix IV.   DVT Prophylaxis Lovenox.  Code Status: Full code.  Family Communication: Discussed with patient.  Disposition Plan: Admit to inpatient. Likely stay 2 days.    Kingston Guiles N. [facility] Pager [phone].  If 7PM-7AM, please contact night-coverage [email or web address] Password [facility] 04/17/2015, 12:42 AM

## 2015-04-17 NOTE — Care Management Note (Signed)
Case Management Note  Patient Details  Name: Megan Davila MRN: [id number] Date of Birth: 23-Aug-1966  Subjective/Objective:48 y/o f admitted w/hypokalemia.Hx:GJ anastomotic stricture,repair.                   Action/Plan:From home.d/c plan home.   Expected Discharge Date:   (unknown)               Expected Discharge Plan:  Home/Self Care  In-House Referral:     Discharge planning Services  CM Consult  Post Acute Care Choice:    Choice offered to:     DME Arranged:    DME Agency:     HH Arranged:    HH Agency:     Status of Service:  In process, will continue to follow  Medicare Important Message Given:    Date Medicare IM Given:    Medicare IM give by:    Date Additional Medicare IM Given:    Additional Medicare Important Message give by:     If discussed at [place] of Stay Meetings, dates discussed:    Additional Comments:  Dessa Phi, RN 04/17/2015, 1:07 PM

## 2015-04-17 NOTE — Progress Notes (Signed)
Pt reports an episode of unwitnessed vomiting in toilet this AM which she flushed. Pt medicated w/ Zofran and instructed to leave emesis for staff to dispose of with any subsequent episodes. Pt verbalizes understanding.

## 2015-04-17 NOTE — Progress Notes (Signed)
Patient ID: Megan Davila, female   DOB: 03/24/1966, 49 y.o.   MRN: [id number] Courtesy note:  Case discussed with Dr. Paulita Fujita and the patient was seen by me.   [name] had roux y gastric bypass in 2002 by Dr. Wonda Cerise at [facility].  She has developed a tight stenosis at her gastrojejunostomy.  She has contacted Dr. Frutoso Chase and has an appointment with him on Monday.  That will provide the best care and followup for her.    Thanks  Matt B. Hassell Done, MD, [facility] [phone] beeper [phone]  04/17/2015 3:25 PM

## 2015-04-18 DIAGNOSIS — Z9884 Bariatric surgery status: Secondary | ICD-10-CM | POA: Insufficient documentation

## 2015-04-18 DIAGNOSIS — E43 Unspecified severe protein-calorie malnutrition: Secondary | ICD-10-CM | POA: Diagnosis present

## 2015-04-18 DIAGNOSIS — K913 Postprocedural intestinal obstruction, unspecified as to partial versus complete: Secondary | ICD-10-CM | POA: Insufficient documentation

## 2015-04-18 MED ORDER — POTASSIUM CHLORIDE IN NACL 20-0.9 MEQ/L-% IV SOLN
INTRAVENOUS | Status: DC
  Administered 2015-04-18: 15:00:00 via INTRAVENOUS
  Filled 2015-04-18: qty 1000

## 2015-04-18 NOTE — Progress Notes (Signed)
Subjective: Unable to tolerate full liquids or pureed foods. Having some upper abdominal soreness.  Objective: Vital signs in last 24 hours: Temp:  [97.9 F (36.6 C)-98.6 F (37 C)] 97.9 F (36.6 C) (05/14 0424) Pulse Rate:  [79-94] 83 (05/14 0424) Resp:  [18-20] 18 (05/14 0424) BP: (93-116)/(55-68) 93/55 mmHg (05/14 0424) SpO2:  [100 %] 100 % (05/14 0424) Weight change:  Last BM Date: 04/17/15  PE: GEN:  NAD, thin and somewhat cachectic-appearing ABD:  Soft, scaphoid, mild epigastric tenderness without peritonitis  Lab Results: CBC    Component Value Date/Time   WBC 6.0 04/17/2015 0420   RBC 4.05 04/17/2015 0420   HGB 10.7* 04/17/2015 0420   HCT 34.7* 04/17/2015 0420   PLT 289 04/17/2015 0420   MCV 85.7 04/17/2015 0420   MCH 26.4 04/17/2015 0420   MCHC 30.8 04/17/2015 0420   RDW 14.7 04/17/2015 0420   LYMPHSABS 3.2 04/17/2015 0420   MONOABS 0.5 04/17/2015 0420   EOSABS 0.2 04/17/2015 0420   BASOSABS 0.0 04/17/2015 0420   CMP     Component Value Date/Time   NA 142 04/17/2015 0420   K 3.7 04/17/2015 0420   CL 105 04/17/2015 0420   CO2 27 04/17/2015 0420   GLUCOSE 110* 04/17/2015 0420   BUN 7 04/17/2015 0420   CREATININE 0.75 04/17/2015 0420   CALCIUM 8.2* 04/17/2015 0420   PROT 6.0* 04/17/2015 0420   ALBUMIN 3.2* 04/17/2015 0420   AST 16 04/17/2015 0420   ALT 8* 04/17/2015 0420   ALKPHOS 41 04/17/2015 0420   BILITOT 0.6 04/17/2015 0420   GFRNONAA >60 04/17/2015 0420   GFRAA >60 04/17/2015 0420   Assessment:  1. Nausea and vomiting. Suspect due to gastrojejunostomy anastomotic stricture.  Unable to tolerate full liquids and pureed diet. 2. Gastric bypass with gastrojejunostomy anastomosis. 3. Weight loss, ongoing despite 10+ years since her gastric bypass. Suspect due to #2 above.  Plan:  1.  Restart IV fluids. 2.  Downgrade diet to clear liquids. 3.  Patient wants to be put on transfer list to [facility], which, as of yesterday, had waiting list  until Tuesday May 17th.  Will try to get this process started. 4.  Will follow.    Landry Dyke 04/18/2015, 12:27 PM   Pager [phone] If no answer or after 5 PM call [phone]

## 2015-04-18 NOTE — Progress Notes (Signed)
Per [facility] tranfer team pt bed assignment in [id number]. Pt aware of bed availability and transfer and appreciative. Report called to Randell Patient, Therapist, sports at [facility]. All questions answered, contact number provided. Awaiting callback from [place] to arrange transport.

## 2015-04-18 NOTE — Progress Notes (Signed)
Pt transferred to [facility] at this time by [facility]. Pt in stable condition, in possession of personal belongings. [facility] team in possession of transfer record. Pt transferred w/ IVF and telemetry monitor.

## 2015-04-18 NOTE — Progress Notes (Addendum)
[facility] PROGRESS NOTE   Assessment/Plan: Gastric outlet obstruction: - I will continue soft diet, however a consulted GI for further management. - vomited twice yesterday. She needs to control her portion sizes. - cont zofran.  GERD (gastroesophageal reflux disease) - Continue PPI IV.   Hypokalemia: - Potassium is improving with IV potassium. We'll continue to monitor. - Due to poor oral intake.      Code Status: full Family Communication: mother Disposition Plan: home 2 dyas   Consultants:  GI  Procedures: ABd x-ray 5.4.2016: Very narrowed anastomosis of the gastric pouch with small bowel resulting in considerable delay in passage of barium   Antibiotics:  None  HPI/Subjective: She is complaining nausea.  Objective: Filed Vitals:   04/17/15 0433 04/17/15 1500 04/17/15 2149 04/18/15 0424  BP: 99/65 116/66 108/68 93/55  Pulse: 84 94 79 83  Temp: 98.3 F (36.8 C) 98.6 F (37 C) 98.3 F (36.8 C) 97.9 F (36.6 C)  TempSrc: Oral Oral Oral Oral  Resp: 18 18 20 18   Height:      Weight:      SpO2: 100% 100% 100% 100%    Intake/Output Summary (Last 24 hours) at 04/18/15 0813 Last data filed at 04/18/15 0517  Gross per 24 hour  Intake 3066.25 ml  Output   1650 ml  Net 1416.25 ml   Filed Weights   04/16/15 2349  Weight: 50.395 kg (111 lb 1.6 oz)    Exam:  General: Alert, awake, oriented x3, in no acute distress. cachectic HEENT: No bruits, no goiter.  Heart: Regular rate and rhythm. Lungs: Good air movement, clear Abdomen: Soft, nontender, nondistended, positive bowel sounds.  Neuro: Grossly intact, nonfocal.   Data Reviewed: Basic Metabolic Panel:  Recent Labs Lab 04/16/15 1814 04/17/15 0420  NA 136 142  K 2.8* 3.7  CL 99* 105  CO2 29 27  GLUCOSE 118* 110*  BUN 10 7  CREATININE 0.85 0.75  CALCIUM 8.7* 8.2*  MG  --  2.1   Liver Function Tests:  Recent Labs Lab 04/17/15 0420  AST 16  ALT 8*  ALKPHOS 41  BILITOT  0.6  PROT 6.0*  ALBUMIN 3.2*   No results for input(s): LIPASE, AMYLASE in the last 168 hours. No results for input(s): AMMONIA in the last 168 hours. CBC:  Recent Labs Lab 04/16/15 1814 04/17/15 0420  WBC 6.1 6.0  NEUTROABS 3.0 2.1  HGB 10.9* 10.7*  HCT 35.0* 34.7*  MCV 85.0 85.7  PLT 343 289   Cardiac Enzymes: No results for input(s): CKTOTAL, CKMB, CKMBINDEX, TROPONINI in the last 168 hours. BNP (last 3 results) No results for input(s): BNP in the last 8760 hours.  ProBNP (last 3 results) No results for input(s): PROBNP in the last 8760 hours.  CBG: No results for input(s): GLUCAP in the last 168 hours.  No results found for this or any previous visit (from the past 240 hour(s)).   Studies: No results found.  Scheduled Meds: . divalproex  250 mg Oral QHS  . enoxaparin (LOVENOX) injection  40 mg Subcutaneous Q24H  . ergocalciferol  50,000 Units Oral Weekly  . feeding supplement (ENSURE ENLIVE)  237 mL Oral BID BM  . feeding supplement (PRO-STAT SUGAR FREE 64)  30 mL Oral BID  . ferrous sulfate  325 mg Oral Q breakfast  . loratadine  10 mg Oral Q24H  . multivitamin with minerals  1 tablet Oral Q24H  . pantoprazole (PROTONIX) IV  40 mg Intravenous Q12H  .  topiramate  100 mg Oral QHS  . venlafaxine XR  150 mg Oral Q24H   Continuous Infusions:    Time Spent: 25 min   Charlynne Cousins  [facility] Pager [phone]. If 7PM-7AM, please contact night-coverage at [email or web address], password [facility] 04/18/2015, 8:13 AM  LOS: 1 day

## 2015-04-18 NOTE — Care Management Note (Signed)
Case Management Note  Patient Details  Name: CHANTALLE DEFILIPPO MRN: [id number] Date of Birth: [birth year]  Subjective/Objective:         Gastro-jejunostomy anastomosis stricture                 Action/Plan: Transfer to [facility] Expected Discharge Date:   (unknown)               Expected Discharge Plan:  Acute to Acute Transfer  In-House Referral:     Discharge planning Services  CM Consult  Post Acute Care Choice:    Choice offered to:     DME Arranged:    DME Agency:     HH Arranged:    [place] Agency:     Status of Service:  Completed, signed off  Medicare Important Message Given:  N/A - LOS <3 / Initial given by admissions Date Medicare IM Given:    Medicare IM give by:    Date Additional Medicare IM Given:    Additional Medicare Important Message give by:     If discussed at [place] of Stay Meetings, dates discussed:    Additional Comments: NCM spoke to Dr. Paulita Fujita. States transfer to [facility] has been arranged and awaiting bed. Accepting surgeon at [facility], Dr. Cammie Sickle. NCM received consult for assistance with Acute to Acute Transfer. [facility] has contacted unit with bed assignment. Unit RN has received bed assignment and will call to give report. Unit Charge RN will contact [facility] to arrange transport. Attending notified that transfer form needed.    Erenest Rasher, RN 04/18/2015, 2:19 PM

## 2015-04-18 NOTE — Discharge Summary (Signed)
Physician Discharge Summary  Megan Davila WCB:[id number] DOB: [birth year] DOA: 04/16/2015  PCP: Reginia Naas, MD  Admit date: 04/16/2015 Discharge date: 04/18/2015  Time spent: 30 minutes  Recommendations for Outpatient Follow-up:  1. She will be transferred to [facility] for further evaluation by general surgery.  Discharge Diagnoses:  Principal Problem:   Gastro-jejunostomy anastomosis stricture Active Problems:   Migraine with aura   Hypokalemia   GERD (gastroesophageal reflux disease)   Protein-calorie malnutrition, severe   Severe protein-calorie malnutrition   Discharge Condition: stable  Diet recommendation: liq   Filed Weights   04/16/15 2349  Weight: 50.395 kg (111 lb 1.6 oz)    History of present illness:  49 y.o. female with history of gastric bypass in 2002 was referred to the ER by patient's gastroenterologist after patient has been feeling weak. Patient states that over the last few months patient has been unable to eat as she usually does and has been having some regurgitation symptoms. Past several weeks she has had further weight loss of about 105, with some abdominal discomfort unable to tolerate diet. When she eats she vomits.Patient had upper GI series done last week which showed narrowing of the gastric outlet stoma.  EGD was performed that showed a tight stricture at the gastro-jejunostomy anastomosis.  ER. In the ER patient's blood work shows significant hypokalemia and patient has been admitted for further management.s. Patient was given Reglan and Benadryl.  Hospital Course:  Gastric outlet obstruction: - She was placed nothing by mouth and her symptoms slightly improved - She was tried on a soft diet and her vomiting resumed. She was switched back to a liquid diet. - GI was consulted who recommended transfer to [facility].   GERD (gastroesophageal reflux disease) - Continue PPI IV.   Hypokalemia: - Potassium is improving with IV potassium.  We'll continue to monitor. - Due to poor oral intake.  Procedures:  none  Consultations:  Dr. Paulita Fujita  Discharge Exam: Filed Vitals:   04/18/15 0424  BP: 93/55  Pulse: 83  Temp: 97.9 F (36.6 C)  Resp: 18    General: A&O x3, cachectic appearing  Cardiovascular: RRR Respiratory: good air movement CTA B/L  Discharge Instructions   Discharge Instructions    Diet - low sodium heart healthy    Complete by:  As directed      Increase activity slowly    Complete by:  As directed           Current Discharge Medication List    CONTINUE these medications which have NOT CHANGED   Details  ALPRAZolam (XANAX) 1 MG tablet Take 1 mg by mouth 2 (two) times daily as needed. Anxiety      Cyanocobalamin (VITAMIN B-12 IJ) Inject 1 each as directed every 30 (thirty) days.      DEXILANT 60 MG capsule Take 60 mg by mouth daily.    divalproex (DEPAKOTE) 250 MG DR tablet Take 1 tablet (250 mg total) by mouth at bedtime. Qty: 90 tablet, Refills: 4    ergocalciferol (VITAMIN D2) 50000 UNITS capsule Take 50,000 Units by mouth once a week.      ferrous sulfate 325 (65 FE) MG tablet Take 325 mg by mouth daily with breakfast.      fexofenadine (ALLEGRA) 180 MG tablet Take 180 mg by mouth daily as needed. allergies     Multiple Vitamins-Minerals (MULTIVITAMIN WITH MINERALS) tablet Take 1 tablet by mouth daily.      topiramate (TOPAMAX) 50 MG tablet  take 1 tablet by mouth every morning and take 2 tablets by mouth every evening Qty: 90 tablet, Refills: 1    venlafaxine XR (EFFEXOR-XR) 150 MG 24 hr capsule Take 150 mg by mouth daily.    zolmitriptan (ZOMIG-ZMT) 5 MG disintegrating tablet PLACE 1 TABLET BY MOUTH ON TONGUE AT ONSET OF HEADACHE; MAY REPEAT x 1 AFTER 2 HOURS; MAX 2 TABS IN 24 HOURS Qty: 8 tablet, Refills: 12    lansoprazole (PREVACID) 30 MG capsule Take 30 mg by mouth daily as needed. Acid reflux         Allergies  Allergen Reactions  . Naproxen Anaphylaxis  .  Monistat [Miconazole] Other (See Comments)    "burning"   . Sulfa Antibiotics Hives      The results of significant diagnostics from this hospitalization (including imaging, microbiology, ancillary and laboratory) are listed below for reference.    Significant Diagnostic Studies: Dg Ugi  W/kub  04/08/2015   CLINICAL DATA:  Continued significant weight loss, nausea and vomiting, possible reflux  EXAM: UPPER GI SERIES WITH KUB  TECHNIQUE: After obtaining a scout radiograph a routine upper GI series was performed using thin barium  FLUOROSCOPY TIME:  Radiation Exposure Index (as provided by the fluoroscopic device): 26 Gy per sq cm  If the device does not provide the exposure index:  Fluoroscopy Time (in minutes and seconds):  2 minutes 48 seconds  Number of Acquired Images:  COMPARISON:  CT abdomen pelvis of 12/13/2010  FINDINGS: A preliminary film of the abdomen shows surgical sutures in the left upper quadrant and left abdomen due to prior gastric bypass surgery. Clips are noted in the right upper quadrant from prior cholecystectomy. A moderate amount of feces is noted throughout colon. No bowel obstruction is seen.  Initially rapid sequence spot films over the cervical region were performed showing a normal swallowing mechanism. Esophageal peristalsis is normal. No hiatal hernia is seen. The small gastric pouch is well visualized in multiple projections. However, the anastomosis with small bowel appears extremely narrowed on multiple views with resultant considerable delay in passage of barium through the narrowed anastomotic site into the small bowel. No gastric lesion is seen. There is moderate gastroesophageal reflux demonstrated. A barium pill was given at the end of study but the patient could not swallow the pill.  IMPRESSION: 1. Very narrowed anastomosis of the gastric pouch with small bowel resulting in considerable delay in passage of barium into the proximal small bowel. 2. Moderate  gastroesophageal reflux.   Electronically Signed   By: Ivar Drape M.D.   On: 04/08/2015 08:54    Microbiology: No results found for this or any previous visit (from the past 240 hour(s)).   Labs: Basic Metabolic Panel:  Recent Labs Lab 04/16/15 1814 04/17/15 0420  NA 136 142  K 2.8* 3.7  CL 99* 105  CO2 29 27  GLUCOSE 118* 110*  BUN 10 7  CREATININE 0.85 0.75  CALCIUM 8.7* 8.2*  MG  --  2.1   Liver Function Tests:  Recent Labs Lab 04/17/15 0420  AST 16  ALT 8*  ALKPHOS 41  BILITOT 0.6  PROT 6.0*  ALBUMIN 3.2*   No results for input(s): LIPASE, AMYLASE in the last 168 hours. No results for input(s): AMMONIA in the last 168 hours. CBC:  Recent Labs Lab 04/16/15 1814 04/17/15 0420  WBC 6.1 6.0  NEUTROABS 3.0 2.1  HGB 10.9* 10.7*  HCT 35.0* 34.7*  MCV 85.0 85.7  PLT 343 289  Cardiac Enzymes: No results for input(s): CKTOTAL, CKMB, CKMBINDEX, TROPONINI in the last 168 hours. BNP: BNP (last 3 results) No results for input(s): BNP in the last 8760 hours.  ProBNP (last 3 results) No results for input(s): PROBNP in the last 8760 hours.  CBG: No results for input(s): GLUCAP in the last 168 hours.     Signed:  Charlynne Cousins  [facility] 04/18/2015, 1:02 PM

## 2015-04-20 HISTORY — PX: STOMACH SURGERY: SHX791

## 2015-05-20 ENCOUNTER — Encounter: Payer: Self-pay | Admitting: Diagnostic Neuroimaging

## 2015-05-20 ENCOUNTER — Ambulatory Visit (INDEPENDENT_AMBULATORY_CARE_PROVIDER_SITE_OTHER): Payer: 59 | Admitting: Diagnostic Neuroimaging

## 2015-05-20 VITALS — BP 101/71 | HR 87 | Ht 63.0 in | Wt 113.2 lb

## 2015-05-20 DIAGNOSIS — G43109 Migraine with aura, not intractable, without status migrainosus: Secondary | ICD-10-CM | POA: Diagnosis not present

## 2015-05-20 MED ORDER — DIVALPROEX SODIUM 250 MG PO DR TAB: 250.0000 mg | DELAYED_RELEASE_TABLET | Freq: Every day | ORAL | Status: DC

## 2015-05-20 MED ORDER — TOPIRAMATE 50 MG PO TABS: ORAL_TABLET | ORAL | Status: DC

## 2015-05-20 MED ORDER — ZOLMITRIPTAN 5 MG PO TBDP: ORAL_TABLET | ORAL | Status: DC

## 2015-05-20 NOTE — Patient Instructions (Signed)
Continue current medications for migraine.

## 2015-05-20 NOTE — Progress Notes (Signed)
[facility]  PATIENT: Megan Davila DOB: [birth year]  REFERRING CLINICIAN:  HISTORY FROM: patient  REASON FOR VISIT: follow up   HISTORICAL  CHIEF COMPLAINT:  Chief Complaint  Patient presents with  . Migraine    rm 7, medication refills  . Follow-up    HISTORY OF PRESENT ILLNESS:   UPDATE 05/20/15: Since last visit, only 6 HA in last 1 year. Had stricture dilation at [facility], complicated by perforation and repair in May 2016. Fortunately, overall doing well now.  UPDATE 05/21/14: Having migraine HAs 3 months out of per year. When she has migraine, it usually lasts up to 1 week at a time, and then she runs out of triptan. Using maxalt and zomig (alternating). Still on TPX and VPA for prevention. Still with stress related to work.  UPDATE 11/13/12 (JM):  She has been doing well without  any severe headaches until today she has a headache 6/10 with sharp pains behind her eyes. She has not taken any pain medications this morning.  She has had a 7 lb weight loss since last visit now 117lbs, has a history of poor absorption with oral medications.     UPDATE 09/20/12 (JM):  Returns to office since 2011, headaches in the last couple of months, has been persistent with headaches.  She had gastric bypass 2002, pregnant 6 years ago. Was 180 and is now 125lbs.  Has increased stress at work.  Tolerating Topamax well but feels that when she takes she has a loss of appetite.  Helpful with headache.  Increase sensitivity to hearing, photosensitivity, tinnitus and nausea.  She feels the air filters at work are causing headaches.  She is unable to state how often has severe headaches.  Has pain behind both eyes.  Unable to take NSAID's secondary to anaphylaxis.  Seen PCP October 10 and prescribed Relapax but has not tried.    PRIOR HPI (04/01/10, VRP): 49 year old right-handed female with history of gastric bypass surgery presenting for evaluation of new onset severe left-sided headache  on March 23, 2010. On March 23, 2010 patient developed sudden onset left-sided headache behind her left eye. This was associated with seeing spots, feeling nausea and photophobia/phonophobia.  Headache lasted for several days. On April 21 she was seen by her primary care physician who prescribed Maxalt (provided mild relief) and hydrocodone (no relief). On April 24 she was evaluated at [facility] emergency room. Since that time her headaches have resolved. She does report persistent mild neck pain and difficulty sleeping. She does report remote history of similar headaches 10 years ago. There is no family history of migraine headache. She denies numbness or weakness in arms or legs.  REVIEW OF SYSTEMS: Full 14 system review of systems performed and notable only for headache nausea murmur freq waking fatigue ringing in ears.  ALLERGIES: Allergies  Allergen Reactions  . Naproxen Anaphylaxis  . Monistat [Miconazole] Other (See Comments)    "burning"   . Sulfa Antibiotics Hives    HOME MEDICATIONS: Outpatient Prescriptions Prior to Visit  Medication Sig Dispense Refill  . ALPRAZolam (XANAX) 1 MG tablet Take 1 mg by mouth 2 (two) times daily as needed. Anxiety      . Cyanocobalamin (VITAMIN B-12 IJ) Inject 1 each as directed every 30 (thirty) days.      Marland Kitchen DEXILANT 60 MG capsule Take 60 mg by mouth daily.    . ergocalciferol (VITAMIN D2) 50000 UNITS capsule Take 50,000 Units by mouth once a week.      Marland Kitchen  ferrous sulfate 325 (65 FE) MG tablet Take 325 mg by mouth daily with breakfast.      . fexofenadine (ALLEGRA) 180 MG tablet Take 180 mg by mouth daily as needed. allergies     . lansoprazole (PREVACID) 30 MG capsule Take 30 mg by mouth daily as needed. Acid reflux      . Multiple Vitamins-Minerals (MULTIVITAMIN WITH MINERALS) tablet Take 1 tablet by mouth daily.      Marland Kitchen venlafaxine XR (EFFEXOR-XR) 150 MG 24 hr capsule Take 150 mg by mouth daily.    . divalproex (DEPAKOTE) 250 MG DR tablet Take 1  tablet (250 mg total) by mouth at bedtime. 90 tablet 4  . topiramate (TOPAMAX) 50 MG tablet take 1 tablet by mouth every morning and take 2 tablets by mouth every evening 90 tablet 1  . zolmitriptan (ZOMIG-ZMT) 5 MG disintegrating tablet PLACE 1 TABLET BY MOUTH ON TONGUE AT ONSET OF HEADACHE; MAY REPEAT x 1 AFTER 2 HOURS; MAX 2 TABS IN 24 HOURS 8 tablet 12   No facility-administered medications prior to visit.    PAST MEDICAL HISTORY: Past Medical History  Diagnosis Date  . Heart murmur   . GERD (gastroesophageal reflux disease)   . Headache(784.0)   . Anxiety   . Depression   . PONV (postoperative nausea and vomiting)   . Anemia   . History of kidney stones   . Abnormal Pap smear     PAST SURGICAL HISTORY: Past Surgical History  Procedure Laterality Date  . Gastric bypass  2002  . Cholecystectomy    . Tonsillectomy    . Lasik    . Nose surgery    . Hysteroscopy w/d&c  10/14/2011    Procedure: DILATATION AND CURETTAGE (D&C) /HYSTEROSCOPY;  Surgeon: Arloa Koh;  Location: [facility];  Service: Gynecology;  Laterality: Bilateral;  . Tonsillectomy    . 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 Cesarean section  2008  . Tubal ligation  2008  . Cervical biopsy  w/ loop electrode excision  11/2013    LGSIL  . Stomach surgery  04/20/2015    dilation of "connection between stomach and intestine", [facility]    FAMILY HISTORY: Family History  Problem Relation Age of Onset  . Diabetes Mother   . Heart disease Father   . Social phobia Father   . Psychiatric Illness Father   . Hypertension Father   . Stroke Father   . Dementia Father   . Heart disease    . Diabetes    . Stroke    . Hypertension Maternal Grandmother   . Thyroid disease Maternal Grandmother   . Hypertension Paternal Grandmother   . Stroke Paternal Grandfather     SOCIAL HISTORY:  History   Social History  . Marital Status: Divorced    Spouse Name: N/A  . Number of  Children: 1  . Years of Education: College   Occupational History  .  [employer]   Social History Main Topics  . Smoking status: Never Smoker   . Smokeless tobacco: Never Used  . Alcohol Use: Yes     Comment: Once a month  . Drug Use: No  . Sexual Activity:    Partners: Male    Birth Control/ Protection: Surgical     Comment: tubal   Other Topics Concern  . Not on file   Social History Narrative   Pt lives at home with her family.  Caffeine Use: once daily     PHYSICAL EXAM  Filed Vitals:   05/20/15 0811  BP: 101/71  Pulse: 87  Height: 5\' 3"  (1.6 m)  Weight: 113 lb 3.2 oz (51.347 kg)    Not recorded      Body mass index is 20.06 kg/(m^2).  GENERAL EXAM: Patient is in no distress; well developed, nourished and groomed; neck is supple  CARDIOVASCULAR: Regular rate and rhythm, no murmurs, no carotid bruits  NEUROLOGIC: MENTAL STATUS: awake, alert, language fluent, comprehension intact, naming intact, fund of knowledge appropriate CRANIAL NERVE:  pupils equal and reactive to light, visual fields full to confrontation, extraocular muscles intact, no nystagmus, facial sensation and strength symmetric, hearing intact, palate elevates symmetrically, uvula midline, shoulder shrug symmetric, tongue midline. MOTOR: normal bulk and tone, full strength in the BUE, BLE SENSORY: normal and symmetric to light touch COORDINATION: finger-nose-finger, fine finger movements normal REFLEXES: deep tendon reflexes present and symmetric GAIT/STATION: narrow based gait     DIAGNOSTIC DATA (LABS, IMAGING, TESTING) - I reviewed patient records, labs, notes, testing and imaging myself where available.  Lab Results  Component Value Date   WBC 6.0 04/17/2015   HGB 10.7* 04/17/2015   HCT 34.7* 04/17/2015   MCV 85.7 04/17/2015   PLT 289 04/17/2015      Component Value Date/Time   NA 142 04/17/2015 0420   K 3.7 04/17/2015 0420   CL 105 04/17/2015 0420   CO2 27  04/17/2015 0420   GLUCOSE 110* 04/17/2015 0420   BUN 7 04/17/2015 0420   CREATININE 0.75 04/17/2015 0420   CALCIUM 8.2* 04/17/2015 0420   PROT 6.0* 04/17/2015 0420   ALBUMIN 3.2* 04/17/2015 0420   AST 16 04/17/2015 0420   ALT 8* 04/17/2015 0420   ALKPHOS 41 04/17/2015 0420   BILITOT 0.6 04/17/2015 0420   GFRNONAA >60 04/17/2015 0420   GFRAA >60 04/17/2015 0420   No results found for: CHOL No results found for: HGBA1C No results found for: VITAMINB12 No results found for: TSH  04/17/10 MRI brain - Essentially normal MRI brain.  There are few non-specific foci of gliosis which can be seen in association with chronic migraine headaches, and are of doubtful clinical significance.  04/17/10 MRA head - normal  01/09/13 CT head - normal    ASSESSMENT AND PLAN  49 y.o. year old female here with history of gastric bypass surgery and intermittent headaches, likely migraine with aura. Stable over last 1 year.   PLAN: I spent 15 minutes of face to face time with patient. Greater than 50% of time was spent in counseling and coordination of care with patient. In summary we discussed:  1. Continue TPX 50 / 100 2. Continue VPA 250mg  qhs 3. Continue Zomig ZMT   Meds ordered this encounter  Medications  . divalproex (DEPAKOTE) 250 MG DR tablet    Sig: Take 1 tablet (250 mg total) by mouth at bedtime.    Dispense:  90 tablet    Refill:  4  . topiramate (TOPAMAX) 50 MG tablet    Sig: take 1 tablet by mouth every morning and take 2 tablets by mouth every evening    Dispense:  270 tablet    Refill:  4  . zolmitriptan (ZOMIG-ZMT) 5 MG disintegrating tablet    Sig: PLACE 1 TABLET BY MOUTH ON TONGUE AT ONSET OF HEADACHE; MAY REPEAT x 1 AFTER 2 HOURS; MAX 2 TABS IN 24 HOURS    Dispense:  8 tablet  Refill:  12   Return in about 1 year (around 05/19/2016).     Penni Bombard, MD 6/48/4720, 7:21 AM Certified in Neurology, Neurophysiology and Neuroimaging  [facility] [address], [place], [place] [address] [phone]

## 2015-05-22 ENCOUNTER — Ambulatory Visit: Payer: 59 | Admitting: Diagnostic Neuroimaging

## 2015-06-09 ENCOUNTER — Telehealth: Payer: Self-pay | Admitting: Family

## 2015-06-09 NOTE — Telephone Encounter (Signed)
Contacted pt regarding new pt appt on 7/13. Pt confirmed appt.

## 2015-06-17 ENCOUNTER — Ambulatory Visit: Payer: 59 | Admitting: Family

## 2015-06-17 ENCOUNTER — Other Ambulatory Visit: Payer: 59

## 2015-06-17 ENCOUNTER — Ambulatory Visit: Payer: 59

## 2015-06-18 ENCOUNTER — Ambulatory Visit: Payer: 59

## 2015-06-18 ENCOUNTER — Ambulatory Visit: Payer: 59 | Admitting: Family

## 2015-06-18 ENCOUNTER — Other Ambulatory Visit: Payer: Self-pay | Admitting: Family

## 2015-06-18 ENCOUNTER — Other Ambulatory Visit: Payer: 59

## 2015-06-19 ENCOUNTER — Ambulatory Visit (HOSPITAL_BASED_OUTPATIENT_CLINIC_OR_DEPARTMENT_OTHER): Payer: 59

## 2015-06-19 ENCOUNTER — Other Ambulatory Visit: Payer: Self-pay | Admitting: Family

## 2015-06-19 ENCOUNTER — Ambulatory Visit (HOSPITAL_BASED_OUTPATIENT_CLINIC_OR_DEPARTMENT_OTHER): Payer: 59 | Admitting: Family

## 2015-06-19 ENCOUNTER — Encounter: Payer: Self-pay | Admitting: Family

## 2015-06-19 VITALS — BP 98/68 | HR 110 | Temp 98.0°F | Resp 16 | Ht 63.0 in | Wt 125.0 lb

## 2015-06-19 VITALS — BP 115/58 | HR 76 | Temp 98.0°F | Resp 20

## 2015-06-19 DIAGNOSIS — D508 Other iron deficiency anemias: Secondary | ICD-10-CM

## 2015-06-19 DIAGNOSIS — D509 Iron deficiency anemia, unspecified: Secondary | ICD-10-CM

## 2015-06-19 DIAGNOSIS — K912 Postsurgical malabsorption, not elsewhere classified: Secondary | ICD-10-CM

## 2015-06-19 DIAGNOSIS — R799 Abnormal finding of blood chemistry, unspecified: Secondary | ICD-10-CM | POA: Diagnosis not present

## 2015-06-19 DIAGNOSIS — Z9884 Bariatric surgery status: Secondary | ICD-10-CM

## 2015-06-19 DIAGNOSIS — D510 Vitamin B12 deficiency anemia due to intrinsic factor deficiency: Secondary | ICD-10-CM

## 2015-06-19 DIAGNOSIS — E538 Deficiency of other specified B group vitamins: Secondary | ICD-10-CM | POA: Diagnosis not present

## 2015-06-19 DIAGNOSIS — F419 Anxiety disorder, unspecified: Secondary | ICD-10-CM | POA: Insufficient documentation

## 2015-06-19 DIAGNOSIS — Z903 Acquired absence of stomach [part of]: Secondary | ICD-10-CM

## 2015-06-19 HISTORY — DX: Acquired absence of stomach (part of): K91.2

## 2015-06-19 HISTORY — DX: Vitamin B12 deficiency anemia due to intrinsic factor deficiency: D51.0

## 2015-06-19 MED ORDER — SODIUM CHLORIDE 0.9 % IV SOLN
Freq: Once | INTRAVENOUS | Status: AC
  Administered 2015-06-19: 09:00:00 via INTRAVENOUS

## 2015-06-19 MED ORDER — CYANOCOBALAMIN 1000 MCG/ML IJ SOLN
INTRAMUSCULAR | Status: AC
  Filled 2015-06-19: qty 1

## 2015-06-19 MED ORDER — SODIUM CHLORIDE 0.9 % IV SOLN
510.0000 mg | Freq: Once | INTRAVENOUS | Status: AC
  Administered 2015-06-19: 510 mg via INTRAVENOUS
  Filled 2015-06-19: qty 17

## 2015-06-19 MED ORDER — CYANOCOBALAMIN 1000 MCG/ML IJ SOLN
1000.0000 ug | Freq: Once | INTRAMUSCULAR | Status: AC
  Administered 2015-06-19: 1000 ug via INTRAMUSCULAR

## 2015-06-19 NOTE — Progress Notes (Signed)
Medical excuse from work faxed to [place] with Christella Scheuermann [phone]) & Lucendia Herrlich, HR [phone]) per pt request.  Short term disability placed in financial counselor inbox. dph

## 2015-06-19 NOTE — Patient Instructions (Addendum)
Ferumoxytol injection What is this medicine? FERUMOXYTOL is an iron complex. Iron is used to make healthy red blood cells, which carry oxygen and nutrients throughout the body. This medicine is used to treat iron deficiency anemia in people with chronic kidney disease. This medicine may be used for other purposes; ask your health care provider or pharmacist if you have questions. COMMON BRAND NAME(S): Feraheme What should I tell my health care provider before I take this medicine? They need to know if you have any of these conditions: -anemia not caused by low iron levels -high levels of iron in the blood -magnetic resonance imaging (MRI) test scheduled -an unusual or allergic reaction to iron, other medicines, foods, dyes, or preservatives -pregnant or trying to get pregnant -breast-feeding How should I use this medicine? This medicine is for injection into a vein. It is given by a health care professional in a hospital or clinic setting. Talk to your pediatrician regarding the use of this medicine in children. Special care may be needed. Overdosage: If you think you've taken too much of this medicine contact a poison control center or emergency room at once. Overdosage: If you think you have taken too much of this medicine contact a poison control center or emergency room at once. NOTE: This medicine is only for you. Do not share this medicine with others. What if I miss a dose? It is important not to miss your dose. Call your doctor or health care professional if you are unable to keep an appointment. What may interact with this medicine? This medicine may interact with the following medications: -other iron products This list may not describe all possible interactions. Give your health care provider a list of all the medicines, herbs, non-prescription drugs, or dietary supplements you use. Also tell them if you smoke, drink alcohol, or use illegal drugs. Some items may interact with your  medicine. What should I watch for while using this medicine? Visit your doctor or healthcare professional regularly. Tell your doctor or healthcare professional if your symptoms do not start to get better or if they get worse. You may need blood work done while you are taking this medicine. You may need to follow a special diet. Talk to your doctor. Foods that contain iron include: whole grains/cereals, dried fruits, beans, or peas, leafy green vegetables, and organ meats (liver, kidney). What side effects may I notice from receiving this medicine? Side effects that you should report to your doctor or health care professional as soon as possible: -allergic reactions like skin rash, itching or hives, swelling of the face, lips, or tongue -breathing problems -changes in blood pressure -feeling faint or lightheaded, falls -fever or chills -flushing, sweating, or hot feelings -swelling of the ankles or feet Side effects that usually do not require medical attention (Report these to your doctor or health care professional if they continue or are bothersome.): -diarrhea -headache -nausea, vomiting -stomach pain This list may not describe all possible side effects. Call your doctor for medical advice about side effects. You may report side effects to FDA at 1-800-FDA-1088. Where should I keep my medicine? This drug is given in a hospital or clinic and will not be stored at home. NOTE: This sheet is a summary. It may not cover all possible information. If you have questions about this medicine, talk to your doctor, pharmacist, or health care provider.  2015, Elsevier/Gold Standard. (2012-07-06 15:23:36) Cyanocobalamin, Vitamin B12 injection What is this medicine? CYANOCOBALAMIN (sye an oh koe BAL  a min) is a man made form of vitamin B12. Vitamin B12 is used in the growth of healthy blood cells, nerve cells, and proteins in the body. It also helps with the metabolism of fats and carbohydrates. This  medicine is used to treat people who can not absorb vitamin B12. This medicine may be used for other purposes; ask your health care provider or pharmacist if you have questions. COMMON BRAND NAME(S): Cyomin, LA-12, Nutri-Twelve, Primabalt What should I tell my health care provider before I take this medicine? They need to know if you have any of these conditions: -kidney disease -Leber's disease -megaloblastic anemia -an unusual or allergic reaction to cyanocobalamin, cobalt, other medicines, foods, dyes, or preservatives -pregnant or trying to get pregnant -breast-feeding How should I use this medicine? This medicine is injected into a muscle or deeply under the skin. It is usually given by a health care professional in a clinic or doctor's office. However, your doctor may teach you how to inject yourself. Follow all instructions. Talk to your pediatrician regarding the use of this medicine in children. Special care may be needed. Overdosage: If you think you have taken too much of this medicine contact a poison control center or emergency room at once. NOTE: This medicine is only for you. Do not share this medicine with others. What if I miss a dose? If you are given your dose at a clinic or doctor's office, call to reschedule your appointment. If you give your own injections and you miss a dose, take it as soon as you can. If it is almost time for your next dose, take only that dose. Do not take double or extra doses. What may interact with this medicine? -colchicine -heavy alcohol intake This list may not describe all possible interactions. Give your health care provider a list of all the medicines, herbs, non-prescription drugs, or dietary supplements you use. Also tell them if you smoke, drink alcohol, or use illegal drugs. Some items may interact with your medicine. What should I watch for while using this medicine? Visit your doctor or health care professional regularly. You may need  blood work done while you are taking this medicine. You may need to follow a special diet. Talk to your doctor. Limit your alcohol intake and avoid smoking to get the best benefit. What side effects may I notice from receiving this medicine? Side effects that you should report to your doctor or health care professional as soon as possible: -allergic reactions like skin rash, itching or hives, swelling of the face, lips, or tongue -blue tint to skin -chest tightness, pain -difficulty breathing, wheezing -dizziness -red, swollen painful area on the leg Side effects that usually do not require medical attention (report to your doctor or health care professional if they continue or are bothersome): -diarrhea -headache This list may not describe all possible side effects. Call your doctor for medical advice about side effects. You may report side effects to FDA at 1-800-FDA-1088. Where should I keep my medicine? Keep out of the reach of children. Store at room temperature between 15 and 30 degrees C (59 and 85 degrees F). Protect from light. Throw away any unused medicine after the expiration date. NOTE: This sheet is a summary. It may not cover all possible information. If you have questions about this medicine, talk to your doctor, pharmacist, or health care provider.  2015, Elsevier/Gold Standard. (2008-03-03 22:10:20)

## 2015-06-19 NOTE — Progress Notes (Signed)
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 She has a gastric bypass in 2002. She developed n/v in April. 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 In May, while stretching this it was perforated and she was taken to surgery.  Now she is feeling extremely fatigued and has had a syncopal episode where she hit her head. She is also experiencing chills and numbness and tingling in her hands. She also gets SOB with any activity.  Her iron saturation was 5% and ferritin was 3.5. Her B 12 is 248. We will give her Feraheme and fluids today.  She is able to eat now and has gained back 25 lbs. She is drinking fluids but states that she feels dehydrated.  She plans to discuss having vitamin D injections with her PCP.  There is no family history of anemia or cancer.  She does not smoke or drink alcohol.    She has an 19 yo son and is discouraged because she can not keep up with him. No miscarriages.  She works in [employer] but is on medical leave right now. This is due to end on Monday. With her having SOB, dizziness and syncope we will extend her leave until August 8th. By then she will have received 2 doses of Feraheme and should be feeling better.   ROS: All other 10 point review of systems is negative.   PAST MEDICAL HISTORY:   Past Medical History  Diagnosis Date  . Heart murmur   . GERD (gastroesophageal reflux disease)   . Headache(784.0)   . Anxiety   . Depression   . PONV (postoperative nausea and vomiting)   . Anemia   . History of kidney stones    . Abnormal Pap smear     ALLERGIES: Allergies  Allergen Reactions  . Naproxen Anaphylaxis  . Monistat [Miconazole] Other (See Comments)    "burning"   . Sulfa Antibiotics Hives      MEDICATIONS:  Current Outpatient Prescriptions on File Prior to Visit  Medication Sig Dispense Refill  . ALPRAZolam (XANAX) 1 MG tablet Take 1 mg by mouth 2 (two) times daily as needed. Anxiety      . Cyanocobalamin (VITAMIN B-12 IJ) Inject 1 each as directed every 30 (thirty) days.      Marland Kitchen DEXILANT 60 MG capsule Take 60 mg by mouth daily.    . divalproex (DEPAKOTE) 250 MG DR tablet Take 1 tablet (250 mg total) by mouth at bedtime. 90 tablet 4  . ergocalciferol (VITAMIN D2) 50000 UNITS capsule Take 50,000 Units by mouth once a week.      . ferrous sulfate 325 (65 FE) MG tablet Take 325 mg by mouth daily with breakfast.      . fexofenadine (ALLEGRA) 180 MG tablet Take 180 mg by mouth daily as needed. allergies     . lansoprazole (PREVACID) 30 MG capsule Take 30 mg by mouth daily as needed. Acid reflux      . Multiple Vitamins-Minerals (MULTIVITAMIN WITH MINERALS) tablet Take 1 tablet  by mouth daily.      Marland Kitchen oxyCODONE (ROXICODONE) 5 MG/5ML solution     . topiramate (TOPAMAX) 50 MG tablet take 1 tablet by mouth every morning and take 2 tablets by mouth every evening 270 tablet 4  . venlafaxine XR (EFFEXOR-XR) 150 MG 24 hr capsule Take 150 mg by mouth daily.    Marland Kitchen zolmitriptan (ZOMIG-ZMT) 5 MG disintegrating tablet PLACE 1 TABLET BY MOUTH ON TONGUE AT ONSET OF HEADACHE; MAY REPEAT x 1 AFTER 2 HOURS; MAX 2 TABS IN 24 HOURS 8 tablet 12   No current facility-administered medications on file prior to visit.     PAST SURGICAL HISTORY Past Surgical History  Procedure Laterality Date  . Gastric bypass  2002  . Cholecystectomy    . Tonsillectomy    . Lasik    . Nose surgery    . Hysteroscopy w/d&c  10/14/2011    Procedure: DILATATION AND CURETTAGE (D&C) /HYSTEROSCOPY;  Surgeon: Arloa Koh;  Location: [facility];  Service: Gynecology;  Laterality: Bilateral;  . Tonsillectomy    . 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 Cesarean section  2008  . Tubal ligation  2008  . Cervical biopsy  w/ loop electrode excision  11/2013    LGSIL  . Stomach surgery  04/20/2015    dilation of "connection between stomach and intestine", [facility]    FAMILY HISTORY: Family History  Problem Relation Age of Onset  . Diabetes Mother   . Heart disease Father   . Social phobia Father   . Psychiatric Illness Father   . Hypertension Father   . Stroke Father   . Dementia Father   . Heart disease    . Diabetes    . Stroke    . Hypertension Maternal Grandmother   . Thyroid disease Maternal Grandmother   . Hypertension Paternal Grandmother   . Stroke Paternal Grandfather     SOCIAL HISTORY:  reports that she has never smoked. She has never used smokeless tobacco. She reports that she drinks alcohol. She reports that she does not use illicit drugs.  PERFORMANCE STATUS: The patient's performance status is 1 - Symptomatic but completely ambulatory  PHYSICAL EXAM: Most Recent Vital Signs: There were no vitals taken for this visit. There were no vitals taken for this visit.  General Appearance:    Alert, cooperative, no distress, appears stated age  Head:    Normocephalic, without obvious abnormality, atraumatic  Eyes:    PERRL, conjunctiva/corneas clear, EOM's intact, fundi    benign, both eyes        Throat:   Lips, mucosa, and tongue normal; teeth and gums normal  Neck:   Supple, symmetrical, trachea midline, no adenopathy;    thyroid:  no enlargement/tenderness/nodules; no carotid   bruit or JVD  Back:     Symmetric, no curvature, ROM normal, no CVA tenderness  Lungs:     Clear to auscultation bilaterally, respirations unlabored  Chest Wall:    No tenderness or deformity   Heart:    Regular rate and rhythm, S1 and S2 normal, no murmur, rub   or gallop     Abdomen:      Soft, non-tender, bowel sounds active all four quadrants,    no masses, no organomegaly        Extremities:   Extremities normal, atraumatic, no cyanosis or edema  Pulses:   2+ and symmetric all extremities  Skin:  Skin color, texture, turgor normal, no rashes or lesions  Lymph nodes:   Cervical, supraclavicular, and axillary nodes normal  Neurologic:   CNII-XII intact, normal strength, sensation and reflexes    throughout   LABORATORY DATA:  No results found for this or any previous visit (from the past 48 hour(s)).    RADIOGRAPHY: No results found.     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 She has a gastric bypass in 2002. 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 While stretching this it was perforated and she was taken to surgery. She is symptomatic with fatigue, SOB, dizziness/syncope and tingling in her hands.  Her iron studies are low and her B 12 is beginning to trend downward. She may need a B 12 injection at her follow-up.  She plans to discuss vitamin D injections with her PCP.  She states that she feels dehydrated so we will give her fluids.  We will give her a dose of Feraheme today and again in 8 days. We will then see her back in 6 weeks for a follow-up and lab work.  All questions were answered. She knows to call the clinic with any problems, questions or concerns. We can certainly see her much sooner if necessary.  She was discussed with and also seen by Dr. Marin Olp and he is in agreement with the aforementioned.   [facility]     Addendum:   I saw and examined the patient with Sarah. We would get her blood smear. She has microcytic red cells. She has some hypochromic red cells. Her white blood cell showed some hypersegmented polys. I saw no blasts. Platelets were somewhat increased in number.  I think everything is very  consistent with iron deficiency. She has the gastric bypass. She will not absorb oral iron.  We will go ahead and give her dose of iron today. We will see what her iron levels show and then determine other infusions of iron.  I also suspect that there might be an element of vitamin B-12 deficiency. Her B-12 level is borderline at best. Her blood smear might be suggestive of pernicious anemia. I think that she would benefit from a B-12 injection.  We will plan to get her back to see [place] in another 6 weeks or so. I would think that at that point, we should be seen a nice improvement in her blood levels.  We spent about 30 minutes or so with her today. We answered all her questions.  Lum Keas

## 2015-06-25 ENCOUNTER — Telehealth: Payer: Self-pay

## 2015-06-25 NOTE — Telephone Encounter (Signed)
Zeniya returned a called she had received from [place], I checked around and no one had called, so I let her know it was the automated sysetm to confirm appt on 06/29/15. She wanted me to let everyone know she is having phone problems and for [place] to leave message and if we don't hear back from her to please call agaon.

## 2015-06-29 ENCOUNTER — Other Ambulatory Visit: Payer: Self-pay | Admitting: Family

## 2015-06-29 ENCOUNTER — Ambulatory Visit (HOSPITAL_BASED_OUTPATIENT_CLINIC_OR_DEPARTMENT_OTHER): Payer: 59

## 2015-06-29 VITALS — BP 105/65 | HR 74 | Temp 98.2°F | Resp 18

## 2015-06-29 DIAGNOSIS — K912 Postsurgical malabsorption, not elsewhere classified: Secondary | ICD-10-CM | POA: Diagnosis not present

## 2015-06-29 DIAGNOSIS — D508 Other iron deficiency anemias: Secondary | ICD-10-CM | POA: Diagnosis not present

## 2015-06-29 DIAGNOSIS — D510 Vitamin B12 deficiency anemia due to intrinsic factor deficiency: Secondary | ICD-10-CM

## 2015-06-29 DIAGNOSIS — D509 Iron deficiency anemia, unspecified: Secondary | ICD-10-CM

## 2015-06-29 MED ORDER — FERUMOXYTOL INJECTION 510 MG/17 ML
510.0000 mg | Freq: Once | INTRAVENOUS | Status: AC
  Administered 2015-06-29: 510 mg via INTRAVENOUS
  Filled 2015-06-29: qty 17

## 2015-06-29 MED ORDER — SODIUM CHLORIDE 0.9 % IV SOLN: 510.0000 mg | Freq: Once | INTRAVENOUS | Status: DC

## 2015-06-29 NOTE — Patient Instructions (Signed)

## 2015-06-30 ENCOUNTER — Telehealth: Payer: Self-pay | Admitting: Hematology & Oncology

## 2015-06-30 NOTE — Telephone Encounter (Signed)
[employer] short term disability papers completed and faxed.  Incident: [id number] Plan: [id number] Plan Holder: Iron   P: [phone]  X [id number] F: [phone]    COPY SCANNED

## 2015-07-16 ENCOUNTER — Telehealth: Payer: Self-pay

## 2015-07-16 ENCOUNTER — Telehealth: Payer: Self-pay | Admitting: Hematology & Oncology

## 2015-07-16 NOTE — Telephone Encounter (Signed)
Called patient and she is aware of her new lab appointment

## 2015-07-16 NOTE — Telephone Encounter (Signed)
Pt called requesting lab appt moved to this aftenoon or tomorrow rather than Monday.

## 2015-07-17 ENCOUNTER — Other Ambulatory Visit (HOSPITAL_BASED_OUTPATIENT_CLINIC_OR_DEPARTMENT_OTHER): Payer: 59

## 2015-07-17 DIAGNOSIS — D508 Other iron deficiency anemias: Secondary | ICD-10-CM

## 2015-07-17 DIAGNOSIS — D509 Iron deficiency anemia, unspecified: Secondary | ICD-10-CM

## 2015-07-17 DIAGNOSIS — D510 Vitamin B12 deficiency anemia due to intrinsic factor deficiency: Secondary | ICD-10-CM

## 2015-07-17 LAB — CBC WITH DIFFERENTIAL/PLATELET
BASO%: 0.7 % (ref 0.0–2.0)
BASOS ABS: 0 10*3/uL (ref 0.0–0.1)
EOS%: 2.1 % (ref 0.0–7.0)
Eosinophils Absolute: 0.1 10*3/uL (ref 0.0–0.5)
HEMATOCRIT: 38 % (ref 34.8–46.6)
HEMOGLOBIN: 11.8 g/dL (ref 11.6–15.9)
LYMPH#: 1.8 10*3/uL (ref 0.9–3.3)
LYMPH%: 37.2 % (ref 14.0–49.7)
MCH: 28.1 pg (ref 25.1–34.0)
MCHC: 31.2 g/dL — AB (ref 31.5–36.0)
MCV: 90.2 fL (ref 79.5–101.0)
MONO#: 0.4 10*3/uL (ref 0.1–0.9)
MONO%: 7.2 % (ref 0.0–14.0)
NEUT#: 2.6 10*3/uL (ref 1.5–6.5)
NEUT%: 52.8 % (ref 38.4–76.8)
Platelets: 261 10*3/uL (ref 145–400)
RBC: 4.21 10*6/uL (ref 3.70–5.45)
RDW: 23.2 % — ABNORMAL HIGH (ref 11.2–14.5)
WBC: 4.9 10*3/uL (ref 3.9–10.3)

## 2015-07-17 LAB — FERRITIN CHCC: Ferritin: 178 ng/ml (ref 9–269)

## 2015-07-17 LAB — IRON AND TIBC CHCC
%SAT: 25 % (ref 21–57)
Iron: 74 ug/dL (ref 41–142)
TIBC: 293 ug/dL (ref 236–444)
UIBC: 219 ug/dL (ref 120–384)

## 2015-07-20 ENCOUNTER — Other Ambulatory Visit: Payer: 59

## 2015-07-20 LAB — VITAMIN B12: Vitamin B-12: 206 pg/mL — ABNORMAL LOW (ref 211–911)

## 2015-07-28 ENCOUNTER — Ambulatory Visit: Payer: 59 | Admitting: Hematology & Oncology

## 2015-07-29 ENCOUNTER — Ambulatory Visit (HOSPITAL_BASED_OUTPATIENT_CLINIC_OR_DEPARTMENT_OTHER): Payer: 59

## 2015-07-29 ENCOUNTER — Ambulatory Visit (HOSPITAL_BASED_OUTPATIENT_CLINIC_OR_DEPARTMENT_OTHER): Payer: 59 | Admitting: Hematology & Oncology

## 2015-07-29 ENCOUNTER — Other Ambulatory Visit: Payer: 59

## 2015-07-29 DIAGNOSIS — D510 Vitamin B12 deficiency anemia due to intrinsic factor deficiency: Secondary | ICD-10-CM

## 2015-07-29 DIAGNOSIS — K912 Postsurgical malabsorption, not elsewhere classified: Secondary | ICD-10-CM

## 2015-07-29 DIAGNOSIS — D509 Iron deficiency anemia, unspecified: Secondary | ICD-10-CM

## 2015-07-29 MED ORDER — CYANOCOBALAMIN 1000 MCG/ML IJ SOLN
INTRAMUSCULAR | Status: AC
  Filled 2015-07-29: qty 1

## 2015-07-29 NOTE — Progress Notes (Signed)
Hematology and Oncology Follow Up Visit  Megan Davila [id number] 1966/05/25 49 y.o. 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  Iron deficiency anemia secondary to past menometrorrhagia  Current Therapy:    IV iron as indicated-last dose July 2016     Interim History:  Megan Davila is back for follow-up. We first saw her in mid July. That point on, she had iron deficiency anemia. She had a gastric bypass about 14 years ago. We saw her back in July, her iron studies showed a ferritin of 3 and iron saturation of 5%. She went ahead and got 2 doses of iron. She felt much better. She goes to [facility]. She had her surgery down there in May.  She wants to make sure that her iron levels are followed closely.  She's had no bleeding. She's had no mouth sores. She's had some tingling in the hands or feet. She does have bladder and B-12 deficiency. We will go ahead and give her dose of B-12 today.  She's had a decent appetite. She is watching what she eats closely.  She's had no cough. She's had no rashes. She's had no leg swelling.  Overall, her performance status is ECOG 0.  Medications:  Current outpatient prescriptions:  .  ALPRAZolam (XANAX) 1 MG tablet, Take 1 mg by mouth 2 (two) times daily as needed. Anxiety  , Disp: , Rfl:  .  Cyanocobalamin (VITAMIN B-12 IJ), Inject 1 each as directed every 30 (thirty) days.  , Disp: , Rfl:  .  DEXILANT 60 MG capsule, Take 60 mg by mouth daily., Disp: , Rfl:  .  divalproex (DEPAKOTE) 250 MG DR tablet, Take 1 tablet (250 mg total) by mouth at bedtime., Disp: 90 tablet, Rfl: 4 .  ergocalciferol (VITAMIN D2) 50000 UNITS capsule, Take 100,000 Units by mouth once a week. , Disp: , Rfl:  .  ferrous sulfate 325 (65 FE) MG tablet, Take 325 mg by mouth daily with breakfast.  , Disp: , Rfl:  .  fexofenadine (ALLEGRA) 180 MG tablet, Take 180 mg by mouth daily as needed.  allergies , Disp: , Rfl:  .  lansoprazole (PREVACID) 30 MG capsule, Take 30 mg by mouth daily as needed. Acid reflux  , Disp: , Rfl:  .  Multiple Vitamins-Minerals (MULTIVITAMIN WITH MINERALS) tablet, Take 1 tablet by mouth daily.  , Disp: , Rfl:  .  ranitidine (ZANTAC) 150 MG capsule, , Disp: , Rfl:  .  topiramate (TOPAMAX) 50 MG tablet, take 1 tablet by mouth every morning and take 2 tablets by mouth every evening, Disp: 270 tablet, Rfl: 4 .  venlafaxine XR (EFFEXOR-XR) 150 MG 24 hr capsule, Take 150 mg by mouth daily., Disp: , Rfl:  .  zolmitriptan (ZOMIG-ZMT) 5 MG disintegrating tablet, PLACE 1 TABLET BY MOUTH ON TONGUE AT ONSET OF HEADACHE; MAY REPEAT x 1 AFTER 2 HOURS; MAX 2 TABS IN 24 HOURS, Disp: 8 tablet, Rfl: 12  Allergies:  Allergies  Allergen Reactions  . Naproxen Anaphylaxis  . Monistat [Miconazole] Other (See Comments)    "burning"   . Sulfa Antibiotics Hives    Past Medical History, Surgical history, Social history, and Family History were reviewed and updated.  Review of Systems: As above  Physical Exam:  vitals were not taken for this visit.  Wt Readings from Last 3 Encounters:  06/19/15 125 lb (56.7 kg)  05/20/15 113 lb 3.2 oz (51.347  kg)  04/16/15 111 lb 1.6 oz (50.395 kg)     Well-developed well-nourished white female in no obvious distress. Head and neck exam shows no ocular or oral lesions. She has no palpable cervical or supraclavicular lymph nodes. Lungs are clear. Cardiac exam regular rate and rhythm with no murmurs, rubs or bruits. Abdomen is soft. She has good bowel sounds. There is no fluid wave. There is no palpable liver or spleen tip. Extremities shows no clubbing, cyanosis or edema. Back exam shows no tenderness over the spine, ribs or hips.  Lab Results  Component Value Date   WBC 4.9 07/17/2015   HGB 11.8 07/17/2015   HCT 38.0 07/17/2015   MCV 90.2 07/17/2015   PLT 261 07/17/2015     Chemistry      Component Value Date/Time   NA 142  04/17/2015 0420   K 3.7 04/17/2015 0420   CL 105 04/17/2015 0420   CO2 27 04/17/2015 0420   BUN 7 04/17/2015 0420   CREATININE 0.75 04/17/2015 0420      Component Value Date/Time   CALCIUM 8.2* 04/17/2015 0420   ALKPHOS 41 04/17/2015 0420   AST 16 04/17/2015 0420   ALT 8* 04/17/2015 0420   BILITOT 0.6 04/17/2015 0420         Impression and Plan: Megan Davila is 49 year old white female with history of gastric bypass. She had gastric bypass surgery in May.  We will get her back in another couple months. She did have iron studies a week ago. They did look much better.  I'm just glad that she is feeling better.   [name], MD 8/24/20164:54 PM

## 2015-07-30 ENCOUNTER — Other Ambulatory Visit: Payer: 59

## 2015-07-30 ENCOUNTER — Telehealth: Payer: Self-pay | Admitting: Hematology & Oncology

## 2015-07-30 ENCOUNTER — Encounter: Payer: Self-pay | Admitting: Hematology & Oncology

## 2015-07-30 DIAGNOSIS — D510 Vitamin B12 deficiency anemia due to intrinsic factor deficiency: Secondary | ICD-10-CM

## 2015-07-30 MED ORDER — CYANOCOBALAMIN 1000 MCG/ML IJ SOLN
1000.0000 ug | Freq: Once | INTRAMUSCULAR | Status: AC
  Administered 2015-07-30: 1000 ug via INTRAMUSCULAR

## 2015-07-30 NOTE — Addendum Note (Signed)
Addended by: Dorene Ar on: 07/30/2015 07:26 AM   Modules accepted: Medications

## 2015-07-30 NOTE — Patient Instructions (Signed)

## 2015-07-30 NOTE — Telephone Encounter (Signed)
Mailed calendar. °

## 2015-08-13 ENCOUNTER — Other Ambulatory Visit: Payer: Self-pay

## 2015-08-13 ENCOUNTER — Telehealth: Payer: Self-pay | Admitting: Obstetrics and Gynecology

## 2015-08-13 DIAGNOSIS — Z1231 Encounter for screening mammogram for malignant neoplasm of breast: Secondary | ICD-10-CM

## 2015-08-13 NOTE — Telephone Encounter (Signed)
Patient is having some "irriation" and would like an appointment today or Friday with Dr.Silva. Patient is at work and could not give more details. Last seen 10/09/14.

## 2015-08-13 NOTE — Telephone Encounter (Signed)
Called patient. She confirms vaginal irritation but not available via phone for triage.  Denies fevers, pelvic pain or dysuria.  Multiple day and times discussed with patient for office visit today or tomorrow. She accepts office visit with Dr. Quincy Simmonds tomorrow at 1330.  Advised any change in condition that is concerning to please call back.  Routing to provider for final review. Patient agreeable to disposition. Will close encounter.

## 2015-08-14 ENCOUNTER — Ambulatory Visit (INDEPENDENT_AMBULATORY_CARE_PROVIDER_SITE_OTHER): Payer: 59 | Admitting: Obstetrics and Gynecology

## 2015-08-14 ENCOUNTER — Encounter: Payer: Self-pay | Admitting: Obstetrics and Gynecology

## 2015-08-14 VITALS — BP 96/60 | HR 60 | Resp 16 | Ht 63.0 in | Wt 136.0 lb

## 2015-08-14 DIAGNOSIS — Z113 Encounter for screening for infections with a predominantly sexual mode of transmission: Secondary | ICD-10-CM

## 2015-08-14 DIAGNOSIS — B081 Molluscum contagiosum: Secondary | ICD-10-CM

## 2015-08-14 DIAGNOSIS — R3915 Urgency of urination: Secondary | ICD-10-CM | POA: Diagnosis not present

## 2015-08-14 LAB — POCT URINALYSIS DIPSTICK
Bilirubin, UA: NEGATIVE
GLUCOSE UA: NEGATIVE
Ketones, UA: NEGATIVE
NITRITE UA: NEGATIVE
PROTEIN UA: NEGATIVE
Urobilinogen, UA: NEGATIVE
pH, UA: 5

## 2015-08-14 MED ORDER — IMIQUIMOD 5 % EX CREA: TOPICAL_CREAM | CUTANEOUS | Status: DC

## 2015-08-14 NOTE — Patient Instructions (Signed)
Molluscum Contagiosum Molluscum contagiosum is a viral infection of the skin that causes smooth surfaced, firm, small (3 to 5 mm), dome-shaped bumps (papules) which are flesh-colored. The bumps usually do not hurt or itch. In children, they most often appear on the face, trunk, arms and legs. In adults, the growths are commonly found on the genitals, thighs, face, neck, and belly (abdomen). The infection may be spread to others by close (skin to skin) contact (such as occurs in schools and swimming pools), sharing towels and clothing, and through sexual contact. The bumps usually disappear without treatment in 2 to 4 months, especially in children. You may have them treated to avoid spreading them. Scraping (curetting) the middle part (central plug) of the bump with a needle or sharp curette, or application of liquid nitrogen for 8 or 9 seconds usually cures the infection. HOME CARE INSTRUCTIONS   Do not scratch the bumps. This may spread the infection to other parts of the body and to other people.  Avoid close contact with others, including sexual contact, until the bumps disappear. Do not share towels or clothing.  If liquid nitrogen was used, blisters will form. Leave the blisters alone and cover with a bandage. The tops will fall off by themselves in 7 to 14 days.  Four months without a lesion is usually a cure. SEEK IMMEDIATE MEDICAL CARE IF:  You have a fever.  You develop swelling, redness, pain, tenderness, or warmth in the areas of the bumps. They may be infected. Document Released: 11/18/2000 Document Revised: 02/13/2012 Document Reviewed: 05/01/2009 ExitCare Patient Information 2015 [employer]. This information is not intended to replace advice given to you by your health care provider. Make sure you discuss any questions you have with your health care provider.  

## 2015-08-14 NOTE — Progress Notes (Signed)
GYNECOLOGY  VISIT   HPI: 49 y.o.   Divorced  Caucasian  female   G65P1 with Patient's last menstrual period was 08/09/2015.   Here Doing waxing.  Notes some lumps.  New partners.  Sometimes has some discomfort with intercourse.  Not using condoms.   Also having urinary urgency.  No real dysuria.   Pap is due 10/2015.  Hx of LEEP.   Had surgery for gastric stricture.  Patient was having weight loss.  Has now gained weight.   GYNECOLOGIC HISTORY: Patient's last menstrual period was 08/09/2015. Contraception: Tubal Ligation  Menopausal hormone therapy: None Last mammogram: 09/10/13 BIRADS1:neg Last pap smear: 10/09/14 Neg. HR HPV:neg        OB History    Gravida Para Term Preterm AB TAB SAB Ectopic Multiple Living   1 1       1 1          Patient Active Problem List   Diagnosis Date Noted  . Anxiety 06/19/2015  . Anemia, iron deficiency 06/19/2015  . Pernicious anemia 06/19/2015  . 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 Protein-calorie malnutrition, severe 04/18/2015  . Severe protein-calorie malnutrition 04/18/2015  . 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 Bariatric surgery status 04/18/2015  . 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 GERD (gastroesophageal reflux disease) 04/17/2015  . Hypokalemia 04/16/2015  . Migraine with aura 05/21/2014  . 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 Heart murmur   . GERD (gastroesophageal reflux disease)   . Headache(784.0)   . Anxiety   . Depression   . PONV (postoperative nausea and vomiting)   . Anemia   . History of kidney stones   . Abnormal Pap smear   . Pernicious anemia 06/19/2015  . 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 Gastric bypass  2002  . Cholecystectomy    . Tonsillectomy    . Lasik    . Nose surgery    .  Hysteroscopy w/d&c  10/14/2011    Procedure: DILATATION AND CURETTAGE (D&C) /HYSTEROSCOPY;  Surgeon: Arloa Koh;  Location: [facility];  Service: Gynecology;  Laterality: Bilateral;  . Tonsillectomy    . 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 Cesarean section  2008  . Tubal ligation  2008  . Cervical biopsy  w/ loop electrode excision  11/2013    LGSIL  . Stomach surgery  04/20/2015    dilation of "[facility] between stomach and [facility]", [facility]    Current Outpatient Prescriptions  Medication Sig Dispense Refill  . ALPRAZolam (XANAX) 1 MG tablet Take 1 mg by mouth 2 (two) times daily as needed. Anxiety      . Cyanocobalamin (VITAMIN B-12 IJ) Inject 1 each as directed every 30 (thirty) days.      Marland Kitchen DEXILANT 60 MG capsule Take 60 mg by mouth daily.    . divalproex (DEPAKOTE) 250 MG DR tablet Take 1 tablet (250 mg total) by mouth at bedtime. 90 tablet 4  . ergocalciferol (VITAMIN D2) 50000 UNITS capsule Take 100,000 Units by mouth once a week.     . ferrous sulfate 325 (65 FE) MG tablet Take 325 mg by mouth daily with breakfast.      . fexofenadine (ALLEGRA) 180 MG tablet Take 180 mg by mouth daily as needed. allergies     . lansoprazole (PREVACID) 30 MG  capsule Take 30 mg by mouth daily as needed. Acid reflux      . Multiple Vitamins-Minerals (MULTIVITAMIN WITH MINERALS) tablet Take 1 tablet by mouth daily.      . ranitidine (ZANTAC) 150 MG capsule     . thiamine (VITAMIN B-1) 50 MG tablet daily.  0  . topiramate (TOPAMAX) 50 MG tablet take 1 tablet by mouth every morning and take 2 tablets by mouth every evening 270 tablet 4  . venlafaxine XR (EFFEXOR-XR) 150 MG 24 hr capsule Take 150 mg by mouth daily.    Marland Kitchen zolmitriptan (ZOMIG-ZMT) 5 MG disintegrating tablet PLACE 1 TABLET BY MOUTH ON TONGUE AT ONSET OF HEADACHE; MAY REPEAT x 1 AFTER 2 HOURS; MAX 2 TABS IN 24 HOURS 8 tablet 12   No current facility-administered medications for this visit.      ALLERGIES: Naproxen; Monistat; and Sulfa antibiotics  Family History  Problem Relation Age of Onset  . Diabetes Mother   . Heart disease Father   . Social phobia Father   . Psychiatric Illness Father   . Hypertension Father   . Stroke Father   . Dementia Father   . Heart disease    . Diabetes    . Stroke    . Hypertension Maternal Grandmother   . Thyroid disease Maternal Grandmother   . Hypertension Paternal Grandmother   . Stroke Paternal Grandfather     Social History   Social History  . Marital Status: Divorced    Spouse Name: N/A  . Number of Children: 1  . Years of Education: College   Occupational History  .  [employer]   Social History Main Topics  . Smoking status: Never Smoker   . Smokeless tobacco: Never Used  . Alcohol Use: 0.0 oz/week    0 Standard drinks or equivalent per week     Comment: Once a month  . Drug Use: No  . Sexual Activity:    Partners: Male    Birth Control/ Protection: Surgical     Comment: tubal   Other Topics Concern  . Not on file   Social History Narrative   Pt lives at home with her family.   Caffeine Use: once daily    ROS:  Pertinent items are noted in HPI.  PHYSICAL EXAMINATION:    BP 96/60 mmHg  Pulse 60  Resp 16  Ht 5\' 3"  (1.6 m)  Wt 136 lb (61.689 kg)  BMI 24.10 kg/m2  LMP 08/09/2015    General appearance: alert, cooperative and appears stated age  Pelvic: External genitalia:  Multiple molluscum contagiosum lesions of the medial thighs and buttocks noted and curetted.               Urethra:  normal appearing urethra with no masses, tenderness or lesions              Bartholins and Skenes: normal                 Vagina: normal appearing vagina with normal color and discharge, no lesions, menstrual blood noted.               Cervix: no lesions                Bimanual Exam:  Uterus:  normal size, contour, position, consistency, mobility, non-tender              Adnexa: normal adnexa and no mass,  fullness, tenderness  Chaperone was present for exam.  ASSESSMENT  Molluscum contagiosum. Urinary urgency.  New sexual partner.   PLAN  Counseled regarding molluscum.  Rx for Aldara cream.  Instructed in use.  STD testing today.  Return for persistent molluscum lesions.  Can do cryotherapy.   An After Visit Summary was printed and given to the patient.  ___25___ minutes face to face time of which over 50% was spent in counseling.

## 2015-08-15 LAB — STD PANEL
HIV 1&2 Ab, 4th Generation: NONREACTIVE
Hepatitis B Surface Ag: NEGATIVE

## 2015-08-15 LAB — WET PREP BY MOLECULAR PROBE
CANDIDA SPECIES: NEGATIVE
GARDNERELLA VAGINALIS: POSITIVE — AB
TRICHOMONAS VAG: NEGATIVE

## 2015-08-15 LAB — HEPATITIS C ANTIBODY: HCV AB: NEGATIVE

## 2015-08-17 ENCOUNTER — Telehealth: Payer: Self-pay | Admitting: Obstetrics and Gynecology

## 2015-08-17 ENCOUNTER — Other Ambulatory Visit: Payer: 59

## 2015-08-17 ENCOUNTER — Ambulatory Visit: Payer: 59

## 2015-08-17 ENCOUNTER — Ambulatory Visit: Payer: 59 | Admitting: Family

## 2015-08-17 LAB — URINE CULTURE

## 2015-08-17 MED ORDER — FLUCONAZOLE 150 MG PO TABS: 150.0000 mg | ORAL_TABLET | Freq: Once | ORAL | Status: DC

## 2015-08-17 MED ORDER — METRONIDAZOLE 0.75 % VA GEL
VAGINAL | Status: DC
Start: 2015-08-17 — End: 2015-09-03

## 2015-08-17 MED ORDER — NITROFURANTOIN MONOHYD MACRO 100 MG PO CAPS: 100.0000 mg | ORAL_CAPSULE | Freq: Two times a day (BID) | ORAL | Status: DC

## 2015-08-17 NOTE — Telephone Encounter (Signed)
Returned call to patient and message from Dr. Quincy Simmonds given.  Patient verbalized understanding and instructions given. Alcohol precautions given with metrogel use. Patient verbalized understanding.  Advised she will be contacted with remainder of results, GC/CT pending. Patient verbalized understanding of instructions for treatment and will follow up as needed.   Reviewed possible contraindications with Dr. Quincy Simmonds as patient has allergy to Miconazole. No new orders received.   Orders placed per Dr. Quincy Simmonds below and sent to [facility] for patient pick up.   Routing to provider for final review. Patient agreeable to disposition. Will close encounter.

## 2015-08-17 NOTE — Telephone Encounter (Signed)
Patient calling in regards to results from 08/14/15 Best # to reach: [phone]

## 2015-08-17 NOTE — Telephone Encounter (Signed)
-----   Message from Nunzio Cobbs, MD sent at 08/17/2015 11:50 AM EDT ----- Please contact patient with results:  She has a UTI and bacterial vaginosis.  I am recommending two different antibiotics: Macrobid 100 mg po bid for 5 days.  Metrogel pv at hs for 5 nights. Can also have an Rx for Diflucan 150 mg po x 1 prn yeast infection.  #2, RF none.  Please send to her pharmacy of choice.   All STD testing was negative  For HIV, syphilis, hep B, and hep C. GC/CT are pending.   Cc- Marisa Sprinkles

## 2015-08-18 LAB — IPS N GONORRHOEA AND CHLAMYDIA BY PCR

## 2015-09-03 ENCOUNTER — Emergency Department (HOSPITAL_BASED_OUTPATIENT_CLINIC_OR_DEPARTMENT_OTHER)
Admission: EM | Admit: 2015-09-03 | Discharge: 2015-09-04 | Disposition: A | Discharge status: Home / Self Care | Payer: 59 | Attending: Emergency Medicine | Admitting: Emergency Medicine

## 2015-09-03 ENCOUNTER — Telehealth: Payer: Self-pay | Admitting: Obstetrics and Gynecology

## 2015-09-03 ENCOUNTER — Encounter (HOSPITAL_BASED_OUTPATIENT_CLINIC_OR_DEPARTMENT_OTHER): Payer: Self-pay | Admitting: Emergency Medicine

## 2015-09-03 ENCOUNTER — Emergency Department (HOSPITAL_BASED_OUTPATIENT_CLINIC_OR_DEPARTMENT_OTHER): Payer: 59

## 2015-09-03 ENCOUNTER — Ambulatory Visit (INDEPENDENT_AMBULATORY_CARE_PROVIDER_SITE_OTHER): Payer: 59 | Admitting: Obstetrics and Gynecology

## 2015-09-03 VITALS — BP 108/66 | HR 60 | Temp 98.8°F | Ht 63.0 in | Wt 138.0 lb

## 2015-09-03 DIAGNOSIS — Z862 Personal history of diseases of the blood and blood-forming organs and certain disorders involving the immune mechanism: Secondary | ICD-10-CM | POA: Insufficient documentation

## 2015-09-03 DIAGNOSIS — R1084 Generalized abdominal pain: Secondary | ICD-10-CM | POA: Diagnosis present

## 2015-09-03 DIAGNOSIS — Z87442 Personal history of urinary calculi: Secondary | ICD-10-CM | POA: Insufficient documentation

## 2015-09-03 DIAGNOSIS — F329 Major depressive disorder, single episode, unspecified: Secondary | ICD-10-CM | POA: Diagnosis not present

## 2015-09-03 DIAGNOSIS — F419 Anxiety disorder, unspecified: Secondary | ICD-10-CM | POA: Diagnosis not present

## 2015-09-03 DIAGNOSIS — Z3202 Encounter for pregnancy test, result negative: Secondary | ICD-10-CM | POA: Diagnosis not present

## 2015-09-03 DIAGNOSIS — R011 Cardiac murmur, unspecified: Secondary | ICD-10-CM | POA: Insufficient documentation

## 2015-09-03 DIAGNOSIS — R1030 Lower abdominal pain, unspecified: Secondary | ICD-10-CM

## 2015-09-03 DIAGNOSIS — Z79899 Other long term (current) drug therapy: Secondary | ICD-10-CM | POA: Diagnosis not present

## 2015-09-03 DIAGNOSIS — K219 Gastro-esophageal reflux disease without esophagitis: Secondary | ICD-10-CM | POA: Insufficient documentation

## 2015-09-03 HISTORY — DX: Perforation of intestine (nontraumatic): K63.1

## 2015-09-03 LAB — CBC WITH DIFFERENTIAL/PLATELET
BASOS ABS: 0 10*3/uL (ref 0.0–0.1)
Basophils Absolute: 0 10*3/uL (ref 0.0–0.1)
Basophils Relative: 0 % (ref 0–1)
Basophils Relative: 0 %
EOS ABS: 0.1 10*3/uL (ref 0.0–0.7)
EOS PCT: 2 % (ref 0–5)
EOS PCT: 1 %
Eosinophils Absolute: 0.1 10*3/uL (ref 0.0–0.7)
HCT: 40.5 % (ref 36.0–46.0)
HEMATOCRIT: 41.1 % (ref 36.0–46.0)
HEMOGLOBIN: 13.4 g/dL (ref 12.0–15.0)
Hemoglobin: 14 g/dL (ref 12.0–15.0)
LYMPHS ABS: 2.7 10*3/uL (ref 0.7–4.0)
LYMPHS PCT: 40 % (ref 12–46)
Lymphocytes Relative: 53 %
Lymphs Abs: 3.3 10*3/uL (ref 0.7–4.0)
MCH: 30.1 pg (ref 26.0–34.0)
MCH: 29.9 pg (ref 26.0–34.0)
MCHC: 34.1 g/dL (ref 30.0–36.0)
MCHC: 33.1 g/dL (ref 30.0–36.0)
MCV: 88.4 fL (ref 78.0–100.0)
MCV: 90.4 fL (ref 78.0–100.0)
MONO ABS: 0.6 10*3/uL (ref 0.1–1.0)
MONOS PCT: 9 % (ref 3–12)
MPV: 8.8 fL (ref 8.6–12.4)
Monocytes Absolute: 0.7 10*3/uL (ref 0.1–1.0)
Monocytes Relative: 11 %
NEUTROS PCT: 35 %
Neutro Abs: 3.3 10*3/uL (ref 1.7–7.7)
Neutro Abs: 2.2 10*3/uL (ref 1.7–7.7)
Neutrophils Relative %: 49 % (ref 43–77)
PLATELETS: 261 10*3/uL (ref 150–400)
Platelets: 261 10*3/uL (ref 150–400)
RBC: 4.65 MIL/uL (ref 3.87–5.11)
RBC: 4.48 MIL/uL (ref 3.87–5.11)
RDW: 17.2 % — AB (ref 11.5–15.5)
RDW: 15.4 % (ref 11.5–15.5)
WBC: 6.8 10*3/uL (ref 4.0–10.5)
WBC: 6.3 10*3/uL (ref 4.0–10.5)

## 2015-09-03 LAB — POCT URINALYSIS DIPSTICK
BILIRUBIN UA: NEGATIVE
GLUCOSE UA: NEGATIVE
Ketones, UA: NEGATIVE
Leukocytes, UA: NEGATIVE
Nitrite, UA: NEGATIVE
Protein, UA: NEGATIVE
RBC UA: NEGATIVE
Urobilinogen, UA: NEGATIVE
pH, UA: 6.5

## 2015-09-03 LAB — COMPREHENSIVE METABOLIC PANEL
ALBUMIN: 4 g/dL (ref 3.5–5.0)
ALK PHOS: 51 U/L (ref 38–126)
ALT: 11 U/L — AB (ref 14–54)
AST: 17 U/L (ref 15–41)
Anion gap: 9 (ref 5–15)
BUN: 22 mg/dL — AB (ref 6–20)
CHLORIDE: 103 mmol/L (ref 101–111)
CO2: 25 mmol/L (ref 22–32)
CREATININE: 0.78 mg/dL (ref 0.44–1.00)
Calcium: 9 mg/dL (ref 8.9–10.3)
GFR calc Af Amer: >60 mL/min (ref >60)
GFR calc non Af Amer: >60 mL/min (ref >60)
GLUCOSE: 82 mg/dL (ref 65–99)
Potassium: 3.4 mmol/L — ABNORMAL LOW (ref 3.5–5.1)
SODIUM: 137 mmol/L (ref 135–145)
Total Bilirubin: 0.4 mg/dL (ref 0.3–1.2)
Total Protein: 7.1 g/dL (ref 6.5–8.1)

## 2015-09-03 LAB — URINALYSIS, ROUTINE W REFLEX MICROSCOPIC
BILIRUBIN URINE: NEGATIVE
Glucose, UA: NEGATIVE mg/dL
HGB URINE DIPSTICK: NEGATIVE
Ketones, ur: NEGATIVE mg/dL
Leukocytes, UA: NEGATIVE
NITRITE: NEGATIVE
PH: 5 (ref 5.0–8.0)
Protein, ur: NEGATIVE mg/dL
SPECIFIC GRAVITY, URINE: 1.023 (ref 1.005–1.030)
Urobilinogen, UA: 0.2 mg/dL (ref 0.0–1.0)

## 2015-09-03 LAB — POCT URINE PREGNANCY: Preg Test, Ur: NEGATIVE

## 2015-09-03 LAB — PREGNANCY, URINE: PREG TEST UR: NEGATIVE

## 2015-09-03 LAB — LIPASE, BLOOD: Lipase: 29 U/L (ref 22–51)

## 2015-09-03 MED ORDER — IOHEXOL 300 MG/ML  SOLN
100.0000 mL | Freq: Once | INTRAMUSCULAR | Status: AC | PRN
  Administered 2015-09-03: 100 mL via INTRAVENOUS

## 2015-09-03 MED ORDER — IOHEXOL 300 MG/ML  SOLN
25.0000 mL | Freq: Once | INTRAMUSCULAR | Status: AC | PRN
  Administered 2015-09-03: 50 mL via ORAL

## 2015-09-03 MED ORDER — SODIUM CHLORIDE 0.9 % IV BOLUS (SEPSIS)
1000.0000 mL | Freq: Once | INTRAVENOUS | Status: AC
  Administered 2015-09-03: 1000 mL via INTRAVENOUS

## 2015-09-03 NOTE — Discharge Instructions (Signed)

## 2015-09-03 NOTE — ED Notes (Signed)
Pt's gyn and gastric surgeon in [place] told pt to come to er for ct to eval for hernia or diverticulitis

## 2015-09-03 NOTE — Telephone Encounter (Signed)
Spoke with patient at time of walk in to office. Patient states that she is having left lower pelvic pain. Feels she may have a urinary tract infection. Was last seen on 08/14/2015. States she also has Molluscum contagiosum. States that "Dr.Silva removed the center of these and gave me a cream to use. The cream is not working." Advised will need to be seen in office for further evaluation. Had patient leave a urine sample in the restroom and return to the lobby for check in. Appointment scheduled for today at 1:45 pm with Dr.Jertson as patient needs an appointment right away due to work. Patient is agreeable to date and time and to see another provider. [place] notified for work up of patient.  Routing to provider for final review. Patient agreeable to disposition. Will close encounter.

## 2015-09-03 NOTE — ED Notes (Signed)
Pt reports abdominal pain x 5 weeks, was seen at gyn today and told to come to er to r/o diverticulitis

## 2015-09-03 NOTE — Telephone Encounter (Signed)
Patient stopped in requesting an appointment. She said, "I am having lower left pain in the front and I am not sure what it is."

## 2015-09-03 NOTE — Patient Instructions (Signed)
Call your GI MD, need to be evaluated for possible diverticulitis.  CBC drawn, recommend CT scan

## 2015-09-03 NOTE — ED Provider Notes (Signed)
CSN: [id number]     Arrival date & time 09/03/15  1847 History   First MD Initiated Contact with Patient 09/03/15 2006     Chief Complaint  Patient presents with  . Abdominal Pain     (Consider location/radiation/quality/duration/timing/severity/associated sxs/prior Treatment) HPI Comments: Patient with history of gastric bypass surgery performed in 2002 at [facility], status post pyloric stenosis with subsequent bowel perforation earlier this year -- presents with complaint of generalized abdominal pain with nausea over the past 5 weeks. Symptoms have been worse over the past week. Patient's PCP placed her on antibiotic for UTI; these have not helped. Patient has not had associated fever or chest pain, shortness of breath, cough, diarrhea, urinary symptoms. Patient states that she consulted with several of her doctors today and was told to come to the emergency department for a CT scan. The onset of this condition was acute. The course is constant. Aggravating factors: palpation/movement. Alleviating factors: none.    Patient is a 49 y.o. female presenting with abdominal pain. The history is provided by the patient.  Abdominal Pain Associated symptoms: no chest pain, no constipation, no cough, no diarrhea, no dysuria, no fever, no nausea, no sore throat, no vaginal bleeding, no vaginal discharge and no vomiting     Past Medical History  Diagnosis Date  . Heart murmur   . GERD (gastroesophageal reflux disease)   . Headache(784.0)   . Anxiety   . Depression   . PONV (postoperative nausea and vomiting)   . Anemia   . History of kidney stones   . Abnormal Pap smear   . Pernicious anemia 06/19/2015  . 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 Perforated bowel    Past Surgical History  Procedure Laterality Date  . Gastric bypass  2002  . Cholecystectomy    . Tonsillectomy    . Lasik    . Nose surgery    . Hysteroscopy w/d&c  10/14/2011    Procedure: DILATATION AND  CURETTAGE (D&C) /HYSTEROSCOPY;  Surgeon: Arloa Koh;  Location: [facility];  Service: Gynecology;  Laterality: Bilateral;  . Tonsillectomy    . 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 Cesarean section  2008  . Tubal ligation  2008  . Cervical biopsy  w/ loop electrode excision  11/2013    LGSIL  . Stomach surgery  04/20/2015    dilation of "connection between stomach and intestine", [facility]   Family History  Problem Relation Age of Onset  . Diabetes Mother   . Heart disease Father   . Social phobia Father   . Psychiatric Illness Father   . Hypertension Father   . Stroke Father   . Dementia Father   . Heart disease    . Diabetes    . Stroke    . Hypertension Maternal Grandmother   . Thyroid disease Maternal Grandmother   . Hypertension Paternal Grandmother   . Stroke Paternal Grandfather    Social History  Substance Use Topics  . Smoking status: Never Smoker   . Smokeless tobacco: Never Used  . Alcohol Use: 0.0 oz/week    0 Standard drinks or equivalent per week     Comment: Once a month   OB History    Gravida Para Term Preterm AB TAB SAB Ectopic Multiple Living   1 1       1 1      Review of Systems  Constitutional: Negative for fever.  HENT:  Negative for rhinorrhea and sore throat.   Eyes: Negative for redness.  Respiratory: Negative for cough.   Cardiovascular: Negative for chest pain.  Gastrointestinal: Positive for abdominal pain. Negative for nausea, vomiting, diarrhea, constipation and blood in stool.  Genitourinary: Negative for dysuria, urgency, vaginal bleeding and vaginal discharge.  Musculoskeletal: Negative for myalgias.  Skin: Negative for rash.  Neurological: Negative for headaches.    Allergies  Naproxen; Monistat; and Sulfa antibiotics  Home Medications   Prior to Admission medications   Medication Sig Start Date End Date Taking? Authorizing Provider  divalproex (DEPAKOTE) 250 MG DR tablet Take 1 tablet (250  mg total) by mouth at bedtime. 05/20/15  Yes Penni Bombard, MD  topiramate (TOPAMAX) 50 MG tablet take 1 tablet by mouth every morning and take 2 tablets by mouth every evening 05/20/15  Yes Vikram R Penumalli, MD  zolmitriptan (ZOMIG-ZMT) 5 MG disintegrating tablet PLACE 1 TABLET BY MOUTH ON TONGUE AT ONSET OF HEADACHE; MAY REPEAT x 1 AFTER 2 HOURS; MAX 2 TABS IN 24 HOURS 05/20/15  Yes Penni Bombard, MD  ALPRAZolam (XANAX) 1 MG tablet Take 1 mg by mouth 2 (two) times daily as needed. Anxiety      Historical Provider, MD  Cyanocobalamin (VITAMIN B-12 IJ) Inject 1 each as directed every 30 (thirty) days.      Historical Provider, MD  DEXILANT 60 MG capsule Take 60 mg by mouth daily. 02/25/15   Historical Provider, MD  ergocalciferol (VITAMIN D2) 50000 UNITS capsule Take 50,000 Units by mouth daily. She is taking daily - pr pt    Historical Provider, MD  ferrous sulfate 325 (65 FE) MG tablet Take 325 mg by mouth daily with breakfast.      Historical Provider, MD  fexofenadine (ALLEGRA) 180 MG tablet Take 180 mg by mouth daily as needed. allergies     Historical Provider, MD  lansoprazole (PREVACID) 30 MG capsule Take 30 mg by mouth daily as needed. Acid reflux      Historical Provider, MD  Multiple Vitamins-Minerals (MULTIVITAMIN WITH MINERALS) tablet Take 1 tablet by mouth daily.      Historical Provider, MD  ranitidine (ZANTAC) 150 MG capsule  05/31/15   Historical Provider, MD  thiamine (VITAMIN B-1) 50 MG tablet daily. 08/07/15   Historical Provider, MD  venlafaxine XR (EFFEXOR-XR) 150 MG 24 hr capsule Take 150 mg by mouth daily.    Historical Provider, MD   BP 131/84 mmHg  Pulse 68  Temp(Src) 98.5 F (36.9 C) (Oral)  Resp 18  Ht 5\' 3"  (1.6 m)  Wt 138 lb (62.596 kg)  BMI 24.45 kg/m2  SpO2 100%  LMP 08/09/2015   Physical Exam  Constitutional: She appears well-developed and well-nourished.  HENT:  Head: Normocephalic and atraumatic.  Eyes: Conjunctivae are normal. Right eye  exhibits no discharge. Left eye exhibits no discharge.  Neck: Normal range of motion. Neck supple.  Cardiovascular: Normal rate, regular rhythm and normal heart sounds.   Pulmonary/Chest: Effort normal and breath sounds normal.  Abdominal: Soft. Bowel sounds are normal. She exhibits no distension. There is tenderness (generalized, patient winces in severe pain with even the lightest palpation of the abdomen). There is no rebound and no guarding.  Neurological: She is alert.  Skin: Skin is warm and dry.  Psychiatric: She has a normal mood and affect.  Nursing note and vitals reviewed.   ED Course  Procedures (including critical care time) Labs Review Labs Reviewed  URINALYSIS, ROUTINE W REFLEX MICROSCOPIC (  NOT AT [facility]) - Abnormal; Notable for the following:    APPearance CLOUDY (*)    All other components within normal limits  COMPREHENSIVE METABOLIC PANEL - Abnormal; Notable for the following:    Potassium 3.4 (*)    BUN 22 (*)    ALT 11 (*)    All other components within normal limits  PREGNANCY, URINE  CBC WITH DIFFERENTIAL/PLATELET  LIPASE, BLOOD    Imaging Review No results found. I have personally reviewed and evaluated these images and lab results as part of my medical decision-making.   EKG Interpretation None       9:07 PM Patient seen and examined. Work-up initiated. Pt declines pain medication.    Vital signs reviewed and are as follows: BP 131/84 mmHg  Pulse 68  Temp(Src) 98.5 F (36.9 C) (Oral)  Resp 18  Ht 5\' 3"  (1.6 m)  Wt 138 lb (62.596 kg)  BMI 24.45 kg/m2  SpO2 100%  LMP 08/09/2015  10:53 PM Continuing to hold for CT abd/pelvis. Handoff to Dr. Tamera Punt at end of shift.   Plan: f/u on CT results. Treat findings. If neg, can go home with appropriate follow-up.   MDM   Final diagnoses:  Generalized abdominal pain   Pending CT scan to complete work-up.    Carlisle Cater, PA-C 09/03/15 [place], MD 09/03/15 [phone]

## 2015-09-03 NOTE — Progress Notes (Signed)
GYNECOLOGY  VISIT   HPI: 49 y.o.   Divorced  Caucasian  female   G1P1 with Patient's last menstrual period was 08/09/2015.   here for LLQ pain for the last 3-4 weeks. Initially mild and intermittent. It has been getting progressively worse, still intermittent. Dull pain up to a 4/10 in severity. Last 5 to 90 minutes. At times waking her up. No fevers, no chills, but colder than normal. She c/o nausea for a couple of day, no emesis. Anorexia for the last few day. Constipated. Last BM was yesterday, small amount. No help with miralax or exlax.  More normal BM q 3-4 days.  Last spring she had surgery for a stricture of her stomach to her intestines, she had a bowel perforation, 2nd surgery same day. Prior gastric bypass.    Occasional frequency to void, not regularly. No dysuria, no urgency.   Menses are irregular, q 1-2 months x 3-4 days. Normal flow. Intermittent mild cramps. Sexually active, tubal ligation for contraception. Same partner x 2 months, negative STD testing other than recent molluscom.   GYNECOLOGIC HISTORY: Patient's last menstrual period was 08/09/2015. Contraception: Tubal Ligation Menopausal hormone therapy: None        OB History    Gravida Para Term Preterm AB TAB SAB Ectopic Multiple Living   1 1       1 1          Patient Active Problem List   Diagnosis Date Noted  . Anxiety 06/19/2015  . Anemia, iron deficiency 06/19/2015  . Pernicious anemia 06/19/2015  . 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 Protein-calorie malnutrition, severe 04/18/2015  . Severe protein-calorie malnutrition 04/18/2015  . 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 Bariatric surgery status 04/18/2015  . 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 GERD (gastroesophageal reflux disease) 04/17/2015  . Hypokalemia 04/16/2015  . Migraine with aura 05/21/2014  . 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 Heart murmur   . GERD (gastroesophageal reflux disease)   . Headache(784.0)   . Anxiety   . Depression   . PONV (postoperative nausea and vomiting)   . Anemia   . History of kidney stones   . Abnormal Pap smear   . Pernicious anemia 06/19/2015  . 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 Gastric bypass  2002  . Cholecystectomy    . Tonsillectomy    . Lasik    . Nose surgery    . Hysteroscopy w/d&c  10/14/2011    Procedure: DILATATION AND CURETTAGE (D&C) /HYSTEROSCOPY;  Surgeon: Arloa Koh;  Location: [facility];  Service: Gynecology;  Laterality: Bilateral;  . Tonsillectomy    . 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 Cesarean section  2008  . Tubal ligation  2008  . Cervical biopsy  w/ loop electrode excision  11/2013    LGSIL  . Stomach surgery  04/20/2015    dilation of "connection between stomach and intestine", [facility]    Current Outpatient Prescriptions  Medication Sig Dispense Refill  . ALPRAZolam (XANAX) 1 MG tablet Take 1 mg by mouth 2 (two) times daily as needed. Anxiety      . Cyanocobalamin (VITAMIN B-12 IJ) Inject 1 each as directed every 30 (thirty) days.      Marland Kitchen DEXILANT 60 MG capsule Take 60 mg by mouth daily.    Marland Kitchen  divalproex (DEPAKOTE) 250 MG DR tablet Take 1 tablet (250 mg total) by mouth at bedtime. 90 tablet 4  . ergocalciferol (VITAMIN D2) 50000 UNITS capsule Take 50,000 Units by mouth daily. She is taking daily - pr pt    . ferrous sulfate 325 (65 FE) MG tablet Take 325 mg by mouth daily with breakfast.      . fexofenadine (ALLEGRA) 180 MG tablet Take 180 mg by mouth daily as needed. allergies     . lansoprazole (PREVACID) 30 MG capsule Take 30 mg by mouth daily as needed. Acid reflux      . Multiple Vitamins-Minerals (MULTIVITAMIN WITH MINERALS) tablet Take 1 tablet by mouth daily.      . ranitidine (ZANTAC) 150 MG capsule     .  thiamine (VITAMIN B-1) 50 MG tablet daily.  0  . topiramate (TOPAMAX) 50 MG tablet take 1 tablet by mouth every morning and take 2 tablets by mouth every evening 270 tablet 4  . venlafaxine XR (EFFEXOR-XR) 150 MG 24 hr capsule Take 150 mg by mouth daily.    Marland Kitchen zolmitriptan (ZOMIG-ZMT) 5 MG disintegrating tablet PLACE 1 TABLET BY MOUTH ON TONGUE AT ONSET OF HEADACHE; MAY REPEAT x 1 AFTER 2 HOURS; MAX 2 TABS IN 24 HOURS 8 tablet 12   No current facility-administered medications for this visit.     ALLERGIES: Naproxen; Monistat; and Sulfa antibiotics  Family History  Problem Relation Age of Onset  . Diabetes Mother   . Heart disease Father   . Social phobia Father   . Psychiatric Illness Father   . Hypertension Father   . Stroke Father   . Dementia Father   . Heart disease    . Diabetes    . Stroke    . Hypertension Maternal Grandmother   . Thyroid disease Maternal Grandmother   . Hypertension Paternal Grandmother   . Stroke Paternal Grandfather     Social History   Social History  . Marital Status: Divorced    Spouse Name: N/A  . Number of Children: 1  . Years of Education: College   Occupational History  .  [employer]   Social History Main Topics  . Smoking status: Never Smoker   . Smokeless tobacco: Never Used  . Alcohol Use: 0.0 oz/week    0 Standard drinks or equivalent per week     Comment: Once a month  . Drug Use: No  . Sexual Activity:    Partners: Male    Birth Control/ Protection: Surgical     Comment: tubal   Other Topics Concern  . Not on file   Social History Narrative   Pt lives at home with her family.   Caffeine Use: once daily    ROS  PHYSICAL EXAMINATION:    BP 108/66 mmHg  Pulse 60  Temp(Src) 98.8 F (37.1 C) (Oral)  Ht 5\' 3"  (1.6 m)  Wt 138 lb (62.596 kg)  BMI 24.45 kg/m2  LMP 08/09/2015    General appearance: alert, cooperative and appears stated age Abdomen: soft, tender in  BLQ , most tender over the area of the  ascending colon. No rebound, no guarding, no masses  Pelvic: External genitalia:  Multiple molluscum on the buttock              Urethra:  normal appearing urethra with no masses, tenderness or lesions              Bartholins and Skenes: normal  Vagina: normal appearing vagina with normal color and discharge, no lesions              Cervix: no lesions              Bimanual Exam:  Uterus:  Anteverted, mobile, feels like she has a small myoma off of the fundus. Not tender              Adnexa: no mass, fullness, tenderness              Rectovaginal: Yes.  .  Confirms.              Anus:  normal sphincter tone, no lesions  Chaperone was present for exam.  ASSESSMENT LLQ abdominal pain, nausea, anorexia, constipation H/O bowel surgery Doubt GYN etiology for pain ?Myoma Molluscum    PLAN CBC stat Recommend she f/u with her primary, GI or ER. Primary's office can't get her in, call into her GI MD at [facility] She needs imaging of her abdomen She can return for further treatment of molluscum Unable to find note of a fibroid on exam, will have her return for an ultrasoun An After Visit Summary was printed and given to the patient.  25 minutes face to face time of which over 50% was spent in counseling.

## 2015-09-03 NOTE — ED Notes (Addendum)
C/o left lower abd pain x 5 weeks w nausea , was treated for uti 3 weeks ago, seen by her md today for recheck on uti, denies urinary sx,  Was sent here for ct

## 2015-09-04 ENCOUNTER — Telehealth (INDEPENDENT_AMBULATORY_CARE_PROVIDER_SITE_OTHER): Payer: 59 | Admitting: Emergency Medicine

## 2015-09-04 DIAGNOSIS — B081 Molluscum contagiosum: Secondary | ICD-10-CM

## 2015-09-04 NOTE — Telephone Encounter (Signed)
Spoke with patient. She went to Emergency Department last night and had CT abdomen and pelvis with contrast and additional lab work completed. Per patient normal test results. She states that pain is better today and she is at work. Denies vaginal complaints, no bleeding no discharge. Patient states she was advised to follow up with GI-Dr. Paulita Fujita but patient states she is unsure if she should do that because she called Dr. Paulita Fujita last week and was advised to see surgeon. Advised patient that she should follow recommendations and return call to Dr. Erlinda Hong office and request office visit. Patient states she will do so.   Patient wishes to schedule appointment with Dr. Quincy Simmonds for removal of continued Molluscum. She states she completed [facility] treatment and that it has worsened. Procedure appointment scheduled for patient 09/09/15 at 1500 with Dr. Quincy Simmonds. Advised may need to plan procedure through insurance and will obtain orders from Dr. Quincy Simmonds.   Advised patient will need to review with provider if Pelvic ultrasound still indicated after CT Scan completed.   Dr. Quincy Simmonds, can you review and advise? Okay to pre-cert Cyrotherapy of skin lesions? Needs Pelvic ultrasound?   Rouing to Dr. Quincy Simmonds and Dr. Talbert Nan

## 2015-09-04 NOTE — Telephone Encounter (Signed)
-----   Message from Salvadore Dom, MD sent at 09/03/2015  5:45 PM EDT ----- Can you please call and check on the patient tomorrow, see with abdominal pain today. I think she has a fibroid uterus, when I mentioned it to her, she seemed to think this was mentioned during exam. I can't find it. Can you please set her up for a TV u/s. I didn't order it yet, because I wanted her to understand why we were doing it. Thanks!!! Sharee Pimple

## 2015-09-04 NOTE — Telephone Encounter (Signed)
Ok to precert cryotherapy of lesions, molluscum. I can examine her at that time on 10/5 and determine with her the need for pelvic ultrasound.  Thank you so much.

## 2015-09-04 NOTE — Telephone Encounter (Signed)
Orders placed.  Patient is scheduled.  cc Kerry Hough for insurance pre-certification and patient contact if necessary.   Routing to Wilfred Siverson for final review. Patient agreeable to disposition. Will close encounter.

## 2015-09-08 NOTE — Addendum Note (Signed)
Addended by: Michele Mcalpine on: 09/08/2015 12:20 PM   Modules accepted: Orders

## 2015-09-09 ENCOUNTER — Ambulatory Visit (INDEPENDENT_AMBULATORY_CARE_PROVIDER_SITE_OTHER): Payer: 59 | Admitting: Obstetrics and Gynecology

## 2015-09-09 ENCOUNTER — Encounter: Payer: Self-pay | Admitting: Obstetrics and Gynecology

## 2015-09-09 ENCOUNTER — Other Ambulatory Visit: Payer: 59

## 2015-09-09 ENCOUNTER — Other Ambulatory Visit (HOSPITAL_BASED_OUTPATIENT_CLINIC_OR_DEPARTMENT_OTHER): Payer: 59

## 2015-09-09 VITALS — BP 118/72 | HR 80 | Ht 63.0 in | Wt 139.0 lb

## 2015-09-09 DIAGNOSIS — N766 Ulceration of vulva: Secondary | ICD-10-CM | POA: Diagnosis not present

## 2015-09-09 DIAGNOSIS — K912 Postsurgical malabsorption, not elsewhere classified: Secondary | ICD-10-CM

## 2015-09-09 DIAGNOSIS — D510 Vitamin B12 deficiency anemia due to intrinsic factor deficiency: Secondary | ICD-10-CM | POA: Diagnosis not present

## 2015-09-09 DIAGNOSIS — Z903 Acquired absence of stomach [part of]: Secondary | ICD-10-CM

## 2015-09-09 DIAGNOSIS — B081 Molluscum contagiosum: Secondary | ICD-10-CM

## 2015-09-09 DIAGNOSIS — D509 Iron deficiency anemia, unspecified: Secondary | ICD-10-CM | POA: Diagnosis not present

## 2015-09-09 LAB — CBC & DIFF AND RETIC
BASO%: 0.1 % (ref 0.0–2.0)
Basophils Absolute: 0 10*3/uL (ref 0.0–0.1)
EOS%: 1.1 % (ref 0.0–7.0)
Eosinophils Absolute: 0.1 10*3/uL (ref 0.0–0.5)
HCT: 39.9 % (ref 34.8–46.6)
HGB: 13.2 g/dL (ref 11.6–15.9)
IMMATURE RETIC FRACT: 2.6 % (ref 1.60–10.00)
LYMPH#: 2.2 10*3/uL (ref 0.9–3.3)
LYMPH%: 29.9 % (ref 14.0–49.7)
MCH: 30 pg (ref 25.1–34.0)
MCHC: 33.1 g/dL (ref 31.5–36.0)
MCV: 90.7 fL (ref 79.5–101.0)
MONO#: 0.4 10*3/uL (ref 0.1–0.9)
MONO%: 5.6 % (ref 0.0–14.0)
NEUT%: 63.3 % (ref 38.4–76.8)
NEUTROS ABS: 4.6 10*3/uL (ref 1.5–6.5)
PLATELETS: 248 10*3/uL (ref 145–400)
RBC: 4.4 10*6/uL (ref 3.70–5.45)
RDW: 14.7 % — AB (ref 11.2–14.5)
RETIC %: 0.71 % (ref 0.70–2.10)
RETIC CT ABS: 31.24 10*3/uL — AB (ref 33.70–90.70)
WBC: 7.3 10*3/uL (ref 3.9–10.3)

## 2015-09-09 LAB — FERRITIN CHCC: Ferritin: 51 ng/ml (ref 9–269)

## 2015-09-09 LAB — VITAMIN B12: VITAMIN B 12: 221 pg/mL (ref 211–911)

## 2015-09-09 LAB — IRON AND TIBC CHCC
%SAT: 40 % (ref 21–57)
Iron: 108 ug/dL (ref 41–142)
TIBC: 269 ug/dL (ref 236–444)
UIBC: 161 ug/dL (ref 120–384)

## 2015-09-09 NOTE — Progress Notes (Signed)
Patient ID: Megan Davila, female   DOB: 09/22/1966, 49 y.o.   MRN: [id number] GYNECOLOGY  VISIT   HPI: 49 y.o.   Divorced  Caucasian  female   G1P1 with Patient's last menstrual period was 09/05/2015 (exact date).   here for recurring molluscum. Patient states Aldara not helping. Wants treatment.  Had curettage of lesions in the office on 08/14/15.  CT scan on 09/03/15 for several week hx of dull lower abdominal pain that comes and goes.  Uterus normal.  Findings consistent with prior gastric bypass surgery and cholecystectomy.  At office visit with Dr. Talbert Nan, when patient was seen for the lower abdominal pain, suggestion of possible small uterine fundal fibroid. Menses are regular.   GYNECOLOGIC HISTORY: Patient's last menstrual period was 09/05/2015 (exact date). Contraception: Tubal Menopausal hormone therapy: n/a Last mammogram: 09-10-13 Density Cat.C/Neg/BiRads 1:[facility]. Last pap smear: 10-09-14 Neg:Neg HR HPV--Hx of LEEP 11/2013.        OB History    Gravida Para Term Preterm AB TAB SAB Ectopic Multiple Living   1 1       1 1          Patient Active Problem List   Diagnosis Date Noted  . Anxiety 06/19/2015  . Anemia, iron deficiency 06/19/2015  . Pernicious anemia 06/19/2015  . 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 Protein-calorie malnutrition, severe ([place]) 04/18/2015  . Severe protein-calorie malnutrition ([place]) 04/18/2015  . 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 Bariatric surgery status 04/18/2015  . 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 GERD (gastroesophageal reflux disease) 04/17/2015  . Hypokalemia 04/16/2015  . Migraine with aura 05/21/2014  . 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 Heart murmur   . GERD (gastroesophageal reflux disease)   . Headache(784.0)   . Anxiety   . Depression   . PONV (postoperative nausea and vomiting)    . Anemia   . History of kidney stones   . Abnormal Pap smear   . Pernicious anemia 06/19/2015  . 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 Perforated bowel [facility])     Past Surgical History  Procedure Laterality Date  . Gastric bypass  2002  . Cholecystectomy    . Tonsillectomy    . Lasik    . Nose surgery    . Hysteroscopy w/d&c  10/14/2011    Procedure: DILATATION AND CURETTAGE (D&C) /HYSTEROSCOPY;  Surgeon: Arloa Koh;  Location: [facility];  Service: Gynecology;  Laterality: Bilateral;  . Tonsillectomy    . 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 Cesarean section  2008  . Tubal ligation  2008  . Cervical biopsy  w/ loop electrode excision  11/2013    LGSIL  . Stomach surgery  04/20/2015    dilation of "connection between stomach and intestine", [facility]    Current Outpatient Prescriptions  Medication Sig Dispense Refill  . ALPRAZolam (XANAX) 1 MG tablet Take 1 mg by mouth 2 (two) times daily as needed. Anxiety      . DEXILANT 60 MG capsule Take 60 mg by mouth daily.    . divalproex (DEPAKOTE) 250 MG DR tablet Take 1 tablet (250 mg total) by mouth at bedtime. 90 tablet 4  . ergocalciferol (VITAMIN D2) 50000 UNITS capsule Take 50,000 Units by mouth daily. She is taking daily - pr pt    . ferrous sulfate 325 (65 FE) MG tablet  Take 325 mg by mouth daily with breakfast.      . fexofenadine (ALLEGRA) 180 MG tablet Take 180 mg by mouth daily as needed. allergies     . lansoprazole (PREVACID) 30 MG capsule Take 30 mg by mouth daily as needed. Acid reflux      . Multiple Vitamins-Minerals (MULTIVITAMIN WITH MINERALS) tablet Take 1 tablet by mouth daily.      . ranitidine (ZANTAC) 150 MG capsule     . thiamine (VITAMIN B-1) 50 MG tablet daily.  0  . topiramate (TOPAMAX) 50 MG tablet take 1 tablet by mouth every morning and take 2 tablets by mouth every evening 270 tablet 4  . venlafaxine XR (EFFEXOR-XR) 150 MG 24 hr capsule Take 150  mg by mouth daily.    Marland Kitchen zolmitriptan (ZOMIG-ZMT) 5 MG disintegrating tablet PLACE 1 TABLET BY MOUTH ON TONGUE AT ONSET OF HEADACHE; MAY REPEAT x 1 AFTER 2 HOURS; MAX 2 TABS IN 24 HOURS 8 tablet 12  . cyanocobalamin (,VITAMIN B-12,) 1000 MCG/ML injection Monthly injection  0  . imiquimod (ALDARA) 5 % cream APPLY ENTIRE PACKET OF CREAM TO AFFECTED AREAS 3 TIMES WEEKLY. AP...  (REFER TO PRESCRIPTION NOTES).  0   No current facility-administered medications for this visit.     ALLERGIES: Naproxen; Monistat; and Sulfa antibiotics  Family History  Problem Relation Age of Onset  . Diabetes Mother   . Heart disease Father   . Social phobia Father   . Psychiatric Illness Father   . Hypertension Father   . Stroke Father   . Dementia Father   . Heart disease    . Diabetes    . Stroke    . Hypertension Maternal Grandmother   . Thyroid disease Maternal Grandmother   . Hypertension Paternal Grandmother   . Stroke Paternal Grandfather     Social History   Social History  . Marital Status: Divorced    Spouse Name: N/A  . Number of Children: 1  . Years of Education: College   Occupational History  .  [employer]   Social History Main Topics  . Smoking status: Never Smoker   . Smokeless tobacco: Never Used  . Alcohol Use: 0.0 oz/week    0 Standard drinks or equivalent per week     Comment: Once a month  . Drug Use: No  . Sexual Activity:    Partners: Male    Birth Control/ Protection: Surgical     Comment: tubal   Other Topics Concern  . Not on file   Social History Narrative   Pt lives at home with her family.   Caffeine Use: once daily    ROS:  Pertinent items are noted in HPI.  PHYSICAL EXAMINATION:    BP 118/72 mmHg  Pulse 80  Ht 5\' 3"  (1.6 m)  Wt 139 lb (63.05 kg)  BMI 24.63 kg/m2  LMP 09/05/2015 (Exact Date)    General appearance: alert, cooperative and appears stated age   Pelvic: External genitalia:  Multiple molluscum of the medial thighs and  perianal region.  Ulceration of the left labia majora, nontender.              Urethra:  normal appearing urethra with no masses, tenderness or lesions              Bartholins and Skenes: normal                 Vagina: normal appearing vagina with normal color and  discharge, no lesions              Cervix: no lesions and consistent with LEEP.                Bimanual Exam:  Uterus:  slightly prominent nontender fundal area, approx 2 cm.              Adnexa: normal adnexa and no mass, fullness, tenderness                Chaperone was present for exam.  ASSESSMENT  Lower abdominal pain of uncertain etiology.  Hx of bowel surgery.  Molluscum contagiosum.  Increased lesions.  Left labial ulceration.  Possible uterine fundal fibroid.  PLAN  Patient will follow up with her GI and surgical team as needed. Counseled regarding molluscum. I will have the patient see her dermatologist as the number of lesions in increasing significantly.  I am concerned that doing cryotherapy will cause significant scarring due to the number of lesions.  HSV and wound culture of the left labia.  Discussion of option for pelvic ultrasound.  As the patient is asymptomatic, I think it is OK to follow this clinically for now and hold off on the pelvic ultrasound.    An After Visit Summary was printed and given to the patient.  __15____ minutes face to face time of which over 50% was spent in counseling.

## 2015-09-09 NOTE — Progress Notes (Signed)
Scheduled patient to see Dr.Steinhelfer at [facility] on 09/15/2015 at 9:50 am. Per Triage nurse this is the earliest available appointment. Triage nurse states she spoke with Dr.Steinhelfer who says it will be okay for the patient to wait to be seen on 10/11. Patient is agreeable to date and time. Per triage nurse patient can call the office in the morning daily to check for cancellations for an earlier appointment. Patient is aware.

## 2015-09-10 ENCOUNTER — Telehealth: Payer: Self-pay | Admitting: *Deleted

## 2015-09-10 NOTE — Telephone Encounter (Addendum)
Patient aware of results. She would like to cancel tomorrows appointment. Dr Marin Olp is fine with this. To re-schedule in approx 2 months  ----- Message from [name], MD sent at 09/09/2015  4:54 PM EDT ----- Call - iron level is ok!!  Laurey Arrow

## 2015-09-11 ENCOUNTER — Ambulatory Visit: Payer: 59 | Admitting: Family

## 2015-09-11 ENCOUNTER — Ambulatory Visit: Payer: 59

## 2015-09-11 LAB — HERPES SIMPLEX VIRUS CULTURE: ORGANISM ID, BACTERIA: NOT DETECTED

## 2015-09-12 LAB — WOUND CULTURE
GRAM STAIN: NONE SEEN
GRAM STAIN: NONE SEEN

## 2015-09-16 ENCOUNTER — Ambulatory Visit: Payer: 59

## 2015-09-16 ENCOUNTER — Other Ambulatory Visit: Payer: 59

## 2015-09-16 ENCOUNTER — Ambulatory Visit: Payer: 59 | Admitting: Family

## 2015-09-21 ENCOUNTER — Ambulatory Visit
Admission: RE | Admit: 2015-09-21 | Discharge: 2015-09-21 | Disposition: A | Discharge status: Home / Self Care | Payer: 59 | Source: Ambulatory Visit

## 2015-09-21 DIAGNOSIS — Z1231 Encounter for screening mammogram for malignant neoplasm of breast: Secondary | ICD-10-CM

## 2015-10-26 ENCOUNTER — Telehealth: Payer: Self-pay | Admitting: Diagnostic Neuroimaging

## 2015-10-26 NOTE — Telephone Encounter (Signed)
Rn call patient back to tell her about the recommendation for her headache. Rn explain that Otila Kluver in infusion can see her today after 0200pm. Rn explain that Dr .Leonie Man recommend phenergan and toradol injection and if that dont work they will try the depacon infusion. Rn explain per Otila Kluver in infusion had no availability to see her wed or Thursday. Pt stated she is at work today and is off wed and thu. Rn ask patient if she would like to see another provider who may have open slots. Pt stated she did not need to see a doctor only the infusion nurse. Rn recommend patient go to urgent care or the emergency room. Pt stated she would go to the ED but she will have to wait a long time. Rn gave patient plenty of options. Pt stated she will call back to see if Otila Kluver had any other availability

## 2015-10-26 NOTE — Telephone Encounter (Signed)
I recommend the patient come in for a Toradol/Phenergan injection and if no relief may consider giving a small dose of Depacon infusion as she is already on Depakote at home

## 2015-10-26 NOTE — Telephone Encounter (Signed)
Patient called to request appointment for Depacon infusion tomorrow.

## 2015-10-26 NOTE — Telephone Encounter (Signed)
Rn call patient about her chronic headaches.Pt stated she is taking the current headache medication as prescribed. Rn stated there is no mention of pt having receive depacon infusion at [facility]. Rn explan Dr. Corwin Levins is out this week. Pt was last seen 05/2015. Rn will forward message to Dr.Sethi the work in md. Pt stated what she has had the headache for 4 days and feels the infusion may [phone] (H).

## 2015-11-05 ENCOUNTER — Other Ambulatory Visit: Payer: Self-pay | Admitting: *Deleted

## 2015-11-05 DIAGNOSIS — D510 Vitamin B12 deficiency anemia due to intrinsic factor deficiency: Secondary | ICD-10-CM

## 2015-11-09 ENCOUNTER — Other Ambulatory Visit (HOSPITAL_BASED_OUTPATIENT_CLINIC_OR_DEPARTMENT_OTHER): Payer: 59

## 2015-11-09 DIAGNOSIS — D510 Vitamin B12 deficiency anemia due to intrinsic factor deficiency: Secondary | ICD-10-CM | POA: Diagnosis not present

## 2015-11-09 DIAGNOSIS — D509 Iron deficiency anemia, unspecified: Secondary | ICD-10-CM

## 2015-11-09 LAB — CBC WITH DIFFERENTIAL/PLATELET
BASO%: 0.8 % (ref 0.0–2.0)
Basophils Absolute: 0 10*3/uL (ref 0.0–0.1)
EOS%: 2.3 % (ref 0.0–7.0)
Eosinophils Absolute: 0.1 10*3/uL (ref 0.0–0.5)
HCT: 39.9 % (ref 34.8–46.6)
HGB: 13 g/dL (ref 11.6–15.9)
LYMPH%: 42.5 % (ref 14.0–49.7)
MCH: 31.4 pg (ref 25.1–34.0)
MCHC: 32.6 g/dL (ref 31.5–36.0)
MCV: 96.3 fL (ref 79.5–101.0)
MONO#: 0.4 10*3/uL (ref 0.1–0.9)
MONO%: 8.7 % (ref 0.0–14.0)
NEUT%: 45.7 % (ref 38.4–76.8)
NEUTROS ABS: 2.2 10*3/uL (ref 1.5–6.5)
PLATELETS: 262 10*3/uL (ref 145–400)
RBC: 4.15 10*6/uL (ref 3.70–5.45)
RDW: 12.9 % (ref 11.2–14.5)
WBC: 4.9 10*3/uL (ref 3.9–10.3)
lymph#: 2.1 10*3/uL (ref 0.9–3.3)

## 2015-11-09 LAB — IRON AND TIBC
%SAT: 47 % (ref 21–57)
IRON: 128 ug/dL (ref 41–142)
TIBC: 271 ug/dL (ref 236–444)
UIBC: 144 ug/dL (ref 120–384)

## 2015-11-09 LAB — FERRITIN: FERRITIN: 46 ng/mL (ref 9–269)

## 2015-11-11 ENCOUNTER — Ambulatory Visit (HOSPITAL_BASED_OUTPATIENT_CLINIC_OR_DEPARTMENT_OTHER): Payer: 59

## 2015-11-11 ENCOUNTER — Ambulatory Visit (HOSPITAL_BASED_OUTPATIENT_CLINIC_OR_DEPARTMENT_OTHER): Payer: 59 | Admitting: Family

## 2015-11-11 ENCOUNTER — Encounter: Payer: Self-pay | Admitting: Family

## 2015-11-11 VITALS — BP 113/76 | HR 82 | Temp 98.1°F | Resp 18 | Ht 63.0 in | Wt 144.0 lb

## 2015-11-11 DIAGNOSIS — D508 Other iron deficiency anemias: Secondary | ICD-10-CM

## 2015-11-11 DIAGNOSIS — D500 Iron deficiency anemia secondary to blood loss (chronic): Secondary | ICD-10-CM | POA: Diagnosis not present

## 2015-11-11 DIAGNOSIS — N921 Excessive and frequent menstruation with irregular cycle: Secondary | ICD-10-CM

## 2015-11-11 DIAGNOSIS — K909 Intestinal malabsorption, unspecified: Secondary | ICD-10-CM

## 2015-11-11 DIAGNOSIS — D510 Vitamin B12 deficiency anemia due to intrinsic factor deficiency: Secondary | ICD-10-CM

## 2015-11-11 DIAGNOSIS — D509 Iron deficiency anemia, unspecified: Secondary | ICD-10-CM

## 2015-11-11 MED ORDER — CYANOCOBALAMIN 1000 MCG/ML IJ SOLN
INTRAMUSCULAR | Status: AC
Start: 2015-11-11 — End: 2015-11-11
  Filled 2015-11-11: qty 1

## 2015-11-11 MED ORDER — CYANOCOBALAMIN 1000 MCG/ML IJ SOLN
1000.0000 ug | Freq: Once | INTRAMUSCULAR | Status: AC
  Administered 2015-11-11: 1000 ug via INTRAMUSCULAR

## 2015-11-11 NOTE — Patient Instructions (Signed)

## 2015-11-11 NOTE — Progress Notes (Signed)
Hematology and Oncology Follow Up Visit  Megan Davila [id number] July 13, 1966 49 y.o. 11/11/2015   Principle Diagnosis:  Iron deficiency anemia secondary to malabsorption Pernicious anemia saner to gastric bypass. Iron deficiency anemia secondary to past menometrorrhagia  Current Therapy:   IV iron as indicated - last dose July 2016 Vitamin B 12 1,000 mcg each month     Interim History:  Megan Davila is here today for a follow-up. She is having some fatigue. Unfortunately, she has a lot of stress with her job which is causing her to have frequent migraines. She is seeing neurology for this.  Her last dose of Feraheme was in July. Her Iron saturation this week was 47% with a ferritin of 46.  Her last B 12 level was 221. She continues her monthly injections.  She has had some numbness and tingling in her hands at times that is unchanged. No swelling, tenderness, numbness or tingling in her extremities.  She has had no chills, n/v, cough, rash, dizziness, SOB, chest pain, palpitations, abdominal pain or changes in bowel or bladder habits.  She is eating healthy and staying well hydrated. Her weight is stable.   Medications:    Medication List       This list is accurate as of: 11/11/15  2:52 PM.  Always use your most recent med list.               ALPRAZolam 1 MG tablet  Commonly known as:  XANAX  Take 1 mg by mouth 2 (two) times daily as needed. Anxiety     cyanocobalamin 1000 MCG/ML injection  Commonly known as:  (VITAMIN B-12)  Monthly injection     DEXILANT 60 MG capsule  Generic drug:  dexlansoprazole  Take 60 mg by mouth daily.     divalproex 250 MG DR tablet  Commonly known as:  DEPAKOTE  Take 1 tablet (250 mg total) by mouth at bedtime.     ergocalciferol 50000 UNITS capsule  Commonly known as:  VITAMIN D2  Take 50,000 Units by mouth daily. She is taking daily - pr pt     fexofenadine 180 MG tablet  Commonly known as:  ALLEGRA  Take 180 mg by mouth daily as  needed. allergies     imiquimod 5 % cream  Commonly known as:  ALDARA  APPLY ENTIRE PACKET OF CREAM TO AFFECTED AREAS 3 TIMES WEEKLY. AP...  (REFER TO PRESCRIPTION NOTES).     lansoprazole 30 MG capsule  Commonly known as:  PREVACID  Take 30 mg by mouth daily as needed. Acid reflux     multivitamin with minerals tablet  Take 1 tablet by mouth daily.     ranitidine 150 MG capsule  Commonly known as:  ZANTAC     thiamine 50 MG tablet  Commonly known as:  VITAMIN B-1  daily.     topiramate 50 MG tablet  Commonly known as:  TOPAMAX  take 1 tablet by mouth every morning and take 2 tablets by mouth every evening     venlafaxine XR 150 MG 24 hr capsule  Commonly known as:  EFFEXOR-XR  Take 150 mg by mouth daily.     zolmitriptan 5 MG disintegrating tablet  Commonly known as:  ZOMIG-ZMT  PLACE 1 TABLET BY MOUTH ON TONGUE AT ONSET OF HEADACHE; MAY REPEAT x 1 AFTER 2 HOURS; MAX 2 TABS IN 24 HOURS        Allergies:  Allergies  Allergen Reactions  . Naproxen Anaphylaxis  .  Monistat [Miconazole] Other (See Comments)    "burning"   . Sulfa Antibiotics Hives    Past Medical History, Surgical history, Social history, and Family History were reviewed and updated.  Review of Systems: All other 10 point review of systems is negative.   Physical Exam:  height is 5\' 3"  (1.6 m) and weight is 144 lb (65.318 kg). Her oral temperature is 98.1 F (36.7 C). Her blood pressure is 113/76 and her pulse is 82. Her respiration is 18.   Wt Readings from Last 3 Encounters:  11/11/15 144 lb (65.318 kg)  09/09/15 139 lb (63.05 kg)  09/03/15 138 lb (62.596 kg)    Ocular: Sclerae unicteric, pupils equal, round and reactive to light Ear-nose-throat: Oropharynx clear, dentition fair Lymphatic: No cervical supraclavicular or axillary adenopathy Lungs no rales or rhonchi, good excursion bilaterally Heart regular rate and rhythm, no murmur appreciated Abd soft, nontender, positive bowel  sounds MSK no focal spinal tenderness, no joint edema Neuro: non-focal, well-oriented, appropriate affect Breasts: Deferred  Lab Results  Component Value Date   WBC 4.9 11/09/2015   HGB 13.0 11/09/2015   HCT 39.9 11/09/2015   MCV 96.3 11/09/2015   PLT 262 11/09/2015   Lab Results  Component Value Date   FERRITIN 46 11/09/2015   IRON 128 11/09/2015   TIBC 271 11/09/2015   UIBC 144 11/09/2015   IRONPCTSAT 47 11/09/2015   Lab Results  Component Value Date   RETICCTPCT 0.71 09/09/2015   RBC 4.15 11/09/2015   RETICCTABS 31.24* 09/09/2015   No results found for: KPAFRELGTCHN, LAMBDASER, KAPLAMBRATIO No results found for: IGGSERUM, IGA, IGMSERUM No results found for: Ronnald Ramp, A1GS, A2GS, Tillman Sers, SPEI   Chemistry      Component Value Date/Time   NA 137 09/03/2015 2050   K 3.4* 09/03/2015 2050   CL 103 09/03/2015 2050   CO2 25 09/03/2015 2050   BUN 22* 09/03/2015 2050   CREATININE 0.78 09/03/2015 2050      Component Value Date/Time   CALCIUM 9.0 09/03/2015 2050   ALKPHOS 51 09/03/2015 2050   AST 17 09/03/2015 2050   ALT 11* 09/03/2015 2050   BILITOT 0.4 09/03/2015 2050     Impression and Plan: Megan Davila is 49 yo white female with iron deficiency anemia secondary to gastric bypass. She has responded nicely to the [facility]. He levels at this time are good. She will not need iron today.  She will get her vitamin B 12 injection today as planned.  Hopefully her job situation will improve so she will not be under so much stress. This is certainly contributing to her fatigue.  We will plan to see her back in 4 months for labs and follow-up.  She will contact us with any questions or concerns. We can certainly see her sooner if need be.   Eliezer Bottom, NP 12/7/20162:52 PM

## 2015-11-20 ENCOUNTER — Encounter: Payer: Self-pay | Admitting: Obstetrics and Gynecology

## 2015-11-20 ENCOUNTER — Ambulatory Visit (INDEPENDENT_AMBULATORY_CARE_PROVIDER_SITE_OTHER): Payer: 59 | Admitting: Obstetrics and Gynecology

## 2015-11-20 VITALS — BP 100/70 | HR 60 | Resp 16 | Ht 63.0 in | Wt 146.2 lb

## 2015-11-20 DIAGNOSIS — Z0000 Encounter for general adult medical examination without abnormal findings: Secondary | ICD-10-CM

## 2015-11-20 DIAGNOSIS — R8290 Unspecified abnormal findings in urine: Secondary | ICD-10-CM | POA: Diagnosis not present

## 2015-11-20 DIAGNOSIS — N852 Hypertrophy of uterus: Secondary | ICD-10-CM

## 2015-11-20 DIAGNOSIS — Z01419 Encounter for gynecological examination (general) (routine) without abnormal findings: Secondary | ICD-10-CM

## 2015-11-20 LAB — POCT URINALYSIS DIPSTICK
BILIRUBIN UA: NEGATIVE
Blood, UA: NEGATIVE
GLUCOSE UA: NEGATIVE
KETONES UA: NEGATIVE
LEUKOCYTES UA: NEGATIVE
NITRITE UA: NEGATIVE
PH UA: 7
Protein, UA: NEGATIVE
Urobilinogen, UA: 1

## 2015-11-20 NOTE — Patient Instructions (Signed)

## 2015-11-20 NOTE — Progress Notes (Signed)
Patient ID: Megan Davila, female   DOB: 01-Apr-1966, 49 y.o.   MRN: [id number] 49 y.o. G1P1 Divorced Caucasian female here for annual exam.    Irregular menses.  9 menses in the last 12 months.  No hot flashes.   Has gained weight back since her corrective gastrointestinal surgery.   Saw dermatology for extensive molluscum.  Saw Dr. Fontaine No at [facility].  Had cautery (silver nitrate) to the areas.  Areas are resolved.   Had STD testing in September 2016, and all was negative.   PCP:  Carol Ada, MD   Patient's last menstrual period was 10/29/2015 (exact date).          Sexually active: Yes.   female The current method of family planning is tubal ligation.    Exercising: No  Smoker:  no  Health Maintenance: Pap:  10-09-14 Neg:Neg HR HPV History of abnormal Pap:  Yes, 1993 Hx conization of cervix--CIN 3, 10-07-13 pap Ascus:Pos HR HPV;LEEP procedure--LGSIL.05-14-14 pap Neg:Pos HR HPV with repeat pap 10-09-14 Neg:Neg HR HPV. MMG:  09-22-15 3D,density Cat.B/Neg/BiRads1:[facility]. Colonoscopy:  03/2015 normal with Dr. Stacie Glaze GI. BMD:   n/a  Result  n/a TDaP:  10-17-13 Screening Labs:  Hb today: PCP, Urine today: 1+urobilinogen.  No dysuria.    reports that she has never smoked. She has never used smokeless tobacco. She reports that she drinks alcohol. She reports that she does not use illicit drugs.  Past Medical History  Diagnosis Date  . Heart murmur   . GERD (gastroesophageal reflux disease)   . Headache(784.0)   . Anxiety   . Depression   . PONV (postoperative nausea and vomiting)   . Anemia   . History of kidney stones   . Abnormal Pap smear   . Pernicious anemia 06/19/2015  . 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 Perforated bowel [facility])     Past Surgical History  Procedure Laterality Date  . Gastric bypass  2002  . Cholecystectomy    . Tonsillectomy    . Lasik    . Nose surgery    . Hysteroscopy w/d&c  10/14/2011    Procedure: DILATATION AND CURETTAGE (D&C) /HYSTEROSCOPY;  Surgeon: Arloa Koh;  Location: [facility];  Service: Gynecology;  Laterality: Bilateral;  . Tonsillectomy    . 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 Cesarean section  2008  . Tubal ligation  2008  . Cervical biopsy  w/ loop electrode excision  11/2013    LGSIL  . Stomach surgery  04/20/2015    dilation of "connection between stomach and intestine", [facility]    Current Outpatient Prescriptions  Medication Sig Dispense Refill  . ALPRAZolam (XANAX) 1 MG tablet Take 1 mg by mouth 2 (two) times daily as needed. Anxiety      . cyanocobalamin (,VITAMIN B-12,) 1000 MCG/ML injection Monthly injection  0  . DEXILANT 60 MG capsule Take 60 mg by mouth daily.    . divalproex (DEPAKOTE) 250 MG DR tablet Take 1 tablet (250 mg total) by mouth at bedtime. 90 tablet 4  . ergocalciferol (VITAMIN D2) 50000 UNITS capsule Take 50,000 Units by mouth daily. She is taking daily - pr pt    . fexofenadine (ALLEGRA) 180 MG tablet Take 180 mg by mouth daily as needed. allergies     . imiquimod (ALDARA) 5 % cream APPLY ENTIRE PACKET OF CREAM TO AFFECTED AREAS 3 TIMES WEEKLY. AP...  (REFER TO PRESCRIPTION  NOTES).  0  . lansoprazole (PREVACID) 30 MG capsule Take 30 mg by mouth daily as needed. Acid reflux      . Multiple Vitamins-Minerals (MULTIVITAMIN WITH MINERALS) tablet Take 1 tablet by mouth daily.      . ranitidine (ZANTAC) 150 MG capsule     . thiamine (VITAMIN B-1) 50 MG tablet daily.  0  . topiramate (TOPAMAX) 50 MG tablet take 1 tablet by mouth every morning and take 2 tablets by mouth every evening 270 tablet 4  . venlafaxine XR (EFFEXOR-XR) 150 MG 24 hr capsule Take 150 mg by mouth daily.    Marland Kitchen zolmitriptan (ZOMIG-ZMT) 5 MG disintegrating tablet PLACE 1 TABLET BY MOUTH ON TONGUE AT ONSET OF HEADACHE; MAY REPEAT x 1 AFTER 2 HOURS; MAX 2 TABS IN 24 HOURS 8 tablet 12   No current facility-administered medications for  this visit.    Family History  Problem Relation Age of Onset  . Diabetes Mother   . Heart disease Father   . Social phobia Father   . Psychiatric Illness Father   . Hypertension Father   . Stroke Father   . Dementia Father   . Heart disease    . Diabetes    . Stroke    . Hypertension Maternal Grandmother   . Thyroid disease Maternal Grandmother   . Hypertension Paternal Grandmother   . Stroke Paternal Grandfather     ROS:  Pertinent items are noted in HPI.  Otherwise, a comprehensive ROS was negative.  Exam:   BP 100/70 mmHg  Pulse 60  Resp 16  Ht 5\' 3"  (1.6 m)  Wt 146 lb 3.2 oz (66.316 kg)  BMI 25.90 kg/m2  LMP 10/29/2015 (Exact Date)    General appearance: alert, cooperative and appears stated age Head: Normocephalic, without obvious abnormality, atraumatic Neck: no adenopathy, supple, symmetrical, trachea midline and thyroid normal to inspection and palpation Lungs: clear to auscultation bilaterally Breasts: normal appearance, no masses or tenderness, Inspection negative, No nipple retraction or dimpling, No nipple discharge or bleeding, No axillary or supraclavicular adenopathy Heart: regular rate and rhythm Abdomen: soft, non-tender; bowel sounds normal; no masses,  no organomegaly Extremities: extremities normal, atraumatic, no cyanosis or edema Skin: Skin color, texture, turgor normal. No rashes or lesions Lymph nodes: Cervical, supraclavicular, and axillary nodes normal. No abnormal inguinal nodes palpated Neurologic: Grossly normal  Pelvic: External genitalia:  One molluscum lesion of the right vulva.              Urethra:  normal appearing urethra with no masses, tenderness or lesions              Bartholins and Skenes: normal                 Vagina: normal appearing vagina with normal color and discharge, no lesions              Cervix: no lesions and menstrual flow starting.              Pap taken: Yes.   Bimanual Exam:  Uterus:  normal size, contour,  position, consistency, mobility, non-tender and Prominent fundus of uterus.              Adnexa:  No masses or tenderness.              Rectovaginal: Yes.  .  Confirms.              Anus:  normal sphincter tone, no lesions  Chaperone was present for exam.  Assessment:   Well woman visit with normal exam. Hx of conization of cervix and LEEP. Hx LGSIL. Prominent uterine fundus.  Possible fibroid. Recent molluscum treated.  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   Plan: Yearly mammogram recommended after age 89.  Recommended self breast exam.  Pap and HR HPV as above. Discussed Calcium, Vitamin D, regular exercise program including cardiovascular and weight bearing exercise. Labs performed.  Yes.  .   See orders.  Urinalysis and micro. Return for pelvic ultrasound.   Use Aldara for remaining molluscum.  No further bikini waxing!  This is how we think patient may have contracted the molluscum. STD testing not needed today.  Was just done.  Refills given on medications.  No..       After visit summary provided.

## 2015-11-21 LAB — URINALYSIS, ROUTINE W REFLEX MICROSCOPIC
Bilirubin Urine: NEGATIVE
Glucose, UA: NEGATIVE
HGB URINE DIPSTICK: NEGATIVE
KETONES UR: NEGATIVE
LEUKOCYTES UA: NEGATIVE
NITRITE: NEGATIVE
PROTEIN: NEGATIVE
Specific Gravity, Urine: 1.024 (ref 1.001–1.035)
pH: 7.5 (ref 5.0–8.0)

## 2015-11-23 ENCOUNTER — Telehealth: Payer: Self-pay

## 2015-11-23 NOTE — Telephone Encounter (Signed)
Spoke with patient. Advised of results as seen below from Dr.Silva. Patient is agreeable and verbalizes understanding.  Routing to provider for final review. Patient agreeable to disposition. Will close encounter.     

## 2015-11-23 NOTE — Telephone Encounter (Signed)
-----   Message from Nunzio Cobbs, MD sent at 11/22/2015 11:56 AM EST ----- Please report follow up urinalysis results to patient.   The final recheck in the lab showed no bilirubin.  Patient does have a history of urobilinogen in her urine when I look back at her prior readings.  I think this is reflecting her anemia and her gastrointestinal issues following her weight loss surgeries.  Further evaluation of the urine is not needed at this time.   Cc- Marisa Sprinkles

## 2015-11-24 ENCOUNTER — Other Ambulatory Visit: Payer: Self-pay | Admitting: Obstetrics and Gynecology

## 2015-11-24 ENCOUNTER — Ambulatory Visit (INDEPENDENT_AMBULATORY_CARE_PROVIDER_SITE_OTHER): Payer: 59 | Admitting: Obstetrics and Gynecology

## 2015-11-24 ENCOUNTER — Ambulatory Visit (INDEPENDENT_AMBULATORY_CARE_PROVIDER_SITE_OTHER): Payer: 59

## 2015-11-24 ENCOUNTER — Encounter: Payer: Self-pay | Admitting: Obstetrics and Gynecology

## 2015-11-24 VITALS — BP 118/70 | HR 64 | Ht 63.0 in | Wt 147.6 lb

## 2015-11-24 DIAGNOSIS — N852 Hypertrophy of uterus: Secondary | ICD-10-CM

## 2015-11-24 DIAGNOSIS — N83201 Unspecified ovarian cyst, right side: Secondary | ICD-10-CM | POA: Diagnosis not present

## 2015-11-24 NOTE — Progress Notes (Signed)
Subjective  49 y.o. G72P1  Caucasian female here for pelvic ultrasound for  Evaluation of uterine fundal enlargement on pelvic exam.  LMP 11/22/15.  This was early.  No right sided pain.  Has some left sided discomfort.   Objective  Pelvic ultrasound images and report reviewed with patient.  Uterus - no masses.  Cesarean Section scar noted.  EMS - 3.32 mm. Ovaries - right ovary with 2.7 x 2.7 cm cyst with debris consistent with hemorrhagic cyst.  Left ovary normal. Free fluid - no       Assessment  Right ovarian cyst.  Probable hemorrhagia ovarian cyst.  No myometrial masses.  I believe what was felt on exam was her uterine fundus due to an anteverted uterus.   Plan  Discussion of ovarian cysts.  Will do a follow up ultrasound in 6 weeks to document resolution of cyst.   ___15____ minutes face to face time of which over 50% was spent in counseling.   After visit summary to patient.

## 2015-11-24 NOTE — Patient Instructions (Signed)
Ovarian Cyst An ovarian cyst is a fluid-filled sac that forms on an ovary. The ovaries are small organs that produce eggs in women. Various types of cysts can form on the ovaries. Most are not cancerous. Many do not cause problems, and they often go away on their own. Some may cause symptoms and require treatment. Common types of ovarian cysts include:  Functional cysts--These cysts may occur every month during the menstrual cycle. This is normal. The cysts usually go away with the next menstrual cycle if the woman does not get pregnant. Usually, there are no symptoms with a functional cyst.  Endometrioma cysts--These cysts form from the tissue that lines the uterus. They are also called "chocolate cysts" because they become filled with blood that turns brown. This type of cyst can cause pain in the lower abdomen during intercourse and with your menstrual period.  Cystadenoma cysts--This type develops from the cells on the outside of the ovary. These cysts can get very big and cause lower abdomen pain and pain with intercourse. This type of cyst can twist on itself, cut off its blood supply, and cause severe pain. It can also easily rupture and cause a lot of pain.  Dermoid cysts--This type of cyst is sometimes found in both ovaries. These cysts may contain different kinds of body tissue, such as skin, teeth, hair, or cartilage. They usually do not cause symptoms unless they get very big.  Theca lutein cysts--These cysts occur when too much of a certain hormone (human chorionic gonadotropin) is produced and overstimulates the ovaries to produce an egg. This is most common after procedures used to assist with the conception of a baby (in vitro fertilization). CAUSES   Fertility drugs can cause a condition in which multiple large cysts are formed on the ovaries. This is called ovarian hyperstimulation syndrome.  A condition called polycystic ovary syndrome can cause hormonal imbalances that can lead to  nonfunctional ovarian cysts. SIGNS AND SYMPTOMS  Many ovarian cysts do not cause symptoms. If symptoms are present, they may include:  Pelvic pain or pressure.  Pain in the lower abdomen.  Pain during sexual intercourse.  Increasing girth (swelling) of the abdomen.  Abnormal menstrual periods.  Increasing pain with menstrual periods.  Stopping having menstrual periods without being pregnant. DIAGNOSIS  These cysts are commonly found during a routine or annual pelvic exam. Tests may be ordered to find out more about the cyst. These tests may include:  Ultrasound.  X-ray of the pelvis.  CT scan.  MRI.  Blood tests. TREATMENT  Many ovarian cysts go away on their own without treatment. Your health care provider may want to check your cyst regularly for 2-3 months to see if it changes. For women in menopause, it is particularly important to monitor a cyst closely because of the higher rate of ovarian cancer in menopausal women. When treatment is needed, it may include any of the following:  A procedure to drain the cyst (aspiration). This may be done using a long needle and ultrasound. It can also be done through a laparoscopic procedure. This involves using a thin, lighted tube with a tiny camera on the end (laparoscope) inserted through a small incision.  Surgery to remove the whole cyst. This may be done using laparoscopic surgery or an open surgery involving a larger incision in the lower abdomen.  Hormone treatment or birth control pills. These methods are sometimes used to help dissolve a cyst. HOME CARE INSTRUCTIONS   Only take over-the-counter   or prescription medicines as directed by your health care provider.  Follow up with your health care provider as directed.  Get regular pelvic exams and Pap tests. SEEK MEDICAL CARE IF:   Your periods are late, irregular, or painful, or they stop.  Your pelvic pain or abdominal pain does not go away.  Your abdomen becomes  larger or swollen.  You have pressure on your bladder or trouble emptying your bladder completely.  You have pain during sexual intercourse.  You have feelings of fullness, pressure, or discomfort in your stomach.  You lose weight for no apparent reason.  You feel generally ill.  You become constipated.  You lose your appetite.  You develop acne.  You have an increase in body and facial hair.  You are gaining weight, without changing your exercise and eating habits.  You think you are pregnant. SEEK IMMEDIATE MEDICAL CARE IF:   You have increasing abdominal pain.  You feel sick to your stomach (nauseous), and you throw up (vomit).  You develop a fever that comes on suddenly.  You have abdominal pain during a bowel movement.  Your menstrual periods become heavier than usual. MAKE SURE YOU:  Understand these instructions.  Will watch your condition.  Will get help right away if you are not doing well or get worse.   This information is not intended to replace advice given to you by your health care provider. Make sure you discuss any questions you have with your health care provider.   Document Released: 11/21/2005 Document Revised: 11/26/2013 Document Reviewed: 07/29/2013 Elsevier Interactive Patient Education 2016 [employer]  

## 2015-11-25 LAB — IPS PAP TEST WITH HPV

## 2015-11-26 ENCOUNTER — Telehealth: Payer: Self-pay | Admitting: Obstetrics and Gynecology

## 2015-11-26 ENCOUNTER — Telehealth: Payer: Self-pay | Admitting: Emergency Medicine

## 2015-11-26 MED ORDER — METRONIDAZOLE 0.75 % VA GEL: VAGINAL | Status: DC

## 2015-11-26 NOTE — Telephone Encounter (Signed)
08 Recall entered and patient has annual exam scheduled for 12/21/2016   Patient given results from Dr. Quincy Simmonds.  She does c/o continued vaginal odor and vaginal discharge. No pelvic pain or fevers. Has used metrogel previously without issues.  Order placed and instructions given and ETOH precautions with Metrogel discussed. Patient verbalized understanding and will follow up as needed.  Routing to provider for final review. Patient agreeable to disposition. Will close encounter. To Dr. Talbert Nan as covering.

## 2015-11-26 NOTE — Telephone Encounter (Signed)
Spoke with pt regarding benefit for ultrasound. Patient understood and agreeable. Patient ready to schedule. Patient scheduled 12/31/15 with Dr Silva. Pt aware of arrival date and time. Pt aware of 72 hours cancellation policy with $100 fee. No further questions. Ok to close °

## 2015-11-26 NOTE — Telephone Encounter (Signed)
-----   Message from Nunzio Cobbs, MD sent at 11/25/2015  9:37 PM EST ----- Please inform patient of her pap showing normal cells and negative HR HPV.  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 She also has signs of bacterial vaginosis, and I would like to offer her treatment with Metrogel pv at hs for 5 nights.  Please send to the pharmacy of choice.   Cc- Marisa Sprinkles

## 2015-12-22 DIAGNOSIS — S92919A Unspecified fracture of unspecified toe(s), initial encounter for closed fracture: Secondary | ICD-10-CM

## 2015-12-22 HISTORY — DX: Unspecified fracture of unspecified toe(s), initial encounter for closed fracture: S92.919A

## 2015-12-31 ENCOUNTER — Ambulatory Visit (INDEPENDENT_AMBULATORY_CARE_PROVIDER_SITE_OTHER): Payer: 59 | Admitting: Obstetrics and Gynecology

## 2015-12-31 ENCOUNTER — Ambulatory Visit (INDEPENDENT_AMBULATORY_CARE_PROVIDER_SITE_OTHER): Payer: 59

## 2015-12-31 ENCOUNTER — Other Ambulatory Visit: Payer: 59

## 2015-12-31 ENCOUNTER — Encounter: Payer: Self-pay | Admitting: Obstetrics and Gynecology

## 2015-12-31 ENCOUNTER — Other Ambulatory Visit: Payer: 59 | Admitting: Obstetrics and Gynecology

## 2015-12-31 VITALS — BP 102/68 | HR 64 | Ht 63.0 in | Wt 144.0 lb

## 2015-12-31 DIAGNOSIS — N83201 Unspecified ovarian cyst, right side: Secondary | ICD-10-CM | POA: Diagnosis not present

## 2015-12-31 DIAGNOSIS — N9489 Other specified conditions associated with female genital organs and menstrual cycle: Secondary | ICD-10-CM

## 2015-12-31 DIAGNOSIS — N83209 Unspecified ovarian cyst, unspecified side: Secondary | ICD-10-CM

## 2015-12-31 DIAGNOSIS — N898 Other specified noninflammatory disorders of vagina: Secondary | ICD-10-CM

## 2015-12-31 MED ORDER — METRONIDAZOLE 500 MG PO TABS
500.0000 mg | ORAL_TABLET | Freq: Two times a day (BID) | ORAL | Status: DC
Start: 2015-12-31 — End: 2016-05-13

## 2015-12-31 NOTE — Progress Notes (Signed)
Subjective  50 y.o. G55P1 Caucasian female here for pelvic ultrasound for recheck of left ovarian hemorrhagic cyst.  Prior ultrasound 11/24/15 Uterus - no masses. Cesarean Section scar noted.  EMS - 3.32 mm. Ovaries - right ovary with 2.7 x 2.7 cm cyst with debris consistent with hemorrhagic cyst. Left ovary normal. Free fluid - no  Had BV on pap in December.  Treated with Metrogel.  Still with some odor.  Wears control top hose due to weight loss following weight loss surgery.  No douching.   Broke her toe.  Stress fracture?  Just stepped on it and broke her toe. New diagnosis if cataracts.    Objective  Pelvic ultrasound images and report reviewed with patient.  Uterus - no masses. EMS - 3.96 mm. Ovaries - right ovarian hemorrhagic cyst resolved.  Left ovary with follicles. Free fluid - no       Assessment  Hemorrhagic cyst resolved. Vaginal odor.  Unable to do wet prep or Affirm due to vaginal gel from ultrasound.   Plan  Discussion of ovarian cysts.  Will treat with Flagyl 500 mg po bid for 7 days.  Recommended using control top hose less often if possible.  Follow up for annual exam and prn.  __15_____ minutes face to face time of which over 50% was spent in counseling.   After visit summary to patient.

## 2016-02-03 HISTORY — PX: CATARACT EXTRACTION, BILATERAL: SHX1313

## 2016-03-09 ENCOUNTER — Other Ambulatory Visit (HOSPITAL_BASED_OUTPATIENT_CLINIC_OR_DEPARTMENT_OTHER): Payer: 59

## 2016-03-09 DIAGNOSIS — D510 Vitamin B12 deficiency anemia due to intrinsic factor deficiency: Secondary | ICD-10-CM | POA: Diagnosis not present

## 2016-03-09 DIAGNOSIS — D509 Iron deficiency anemia, unspecified: Secondary | ICD-10-CM | POA: Diagnosis not present

## 2016-03-09 LAB — CBC & DIFF AND RETIC
BASO%: 0.2 % (ref 0.0–2.0)
BASOS ABS: 0 10*3/uL (ref 0.0–0.1)
EOS%: 2.4 % (ref 0.0–7.0)
Eosinophils Absolute: 0.2 10*3/uL (ref 0.0–0.5)
HEMATOCRIT: 41.9 % (ref 34.8–46.6)
HGB: 13.8 g/dL (ref 11.6–15.9)
Immature Retic Fract: 2.1 % (ref 1.60–10.00)
LYMPH%: 43.6 % (ref 14.0–49.7)
MCH: 30.8 pg (ref 25.1–34.0)
MCHC: 32.9 g/dL (ref 31.5–36.0)
MCV: 93.5 fL (ref 79.5–101.0)
MONO#: 0.4 10*3/uL (ref 0.1–0.9)
MONO%: 6.8 % (ref 0.0–14.0)
NEUT#: 2.9 10*3/uL (ref 1.5–6.5)
NEUT%: 47 % (ref 38.4–76.8)
PLATELETS: 282 10*3/uL (ref 145–400)
RBC: 4.48 10*6/uL (ref 3.70–5.45)
RDW: 12.4 % (ref 11.2–14.5)
Retic %: 0.99 % (ref 0.70–2.10)
Retic Ct Abs: 44.35 10*3/uL (ref 33.70–90.70)
WBC: 6.2 10*3/uL (ref 3.9–10.3)
lymph#: 2.7 10*3/uL (ref 0.9–3.3)

## 2016-03-09 LAB — IRON AND TIBC
%SAT: 6 % — ABNORMAL LOW (ref 21–57)
Iron: 22 ug/dL — ABNORMAL LOW (ref 41–142)
TIBC: 353 ug/dL (ref 236–444)
UIBC: 331 ug/dL (ref 120–384)

## 2016-03-10 ENCOUNTER — Telehealth: Payer: Self-pay | Admitting: *Deleted

## 2016-03-10 ENCOUNTER — Other Ambulatory Visit: Payer: Self-pay | Admitting: *Deleted

## 2016-03-10 DIAGNOSIS — D510 Vitamin B12 deficiency anemia due to intrinsic factor deficiency: Secondary | ICD-10-CM

## 2016-03-10 LAB — VITAMIN B12: Vitamin B12: 373 pg/mL (ref 211–946)

## 2016-03-10 NOTE — Telephone Encounter (Addendum)
Patient aware of results. Will get first dose tomorrow and she will schedule second dose at that time.   ----- Message from Eliezer Bottom, NP sent at 03/10/2016  9:55 AM EDT ----- Regarding: Iron Iron studies were quite low. She will need 2 doses of feraheme scheduled please. Thank you!  Sarah  ----- Message -----    From: [facility]    Sent: 03/09/2016   1:49 PM      To: Eliezer Bottom, NP

## 2016-03-11 ENCOUNTER — Ambulatory Visit (HOSPITAL_BASED_OUTPATIENT_CLINIC_OR_DEPARTMENT_OTHER): Payer: 59

## 2016-03-11 ENCOUNTER — Ambulatory Visit (HOSPITAL_BASED_OUTPATIENT_CLINIC_OR_DEPARTMENT_OTHER): Payer: 59 | Admitting: Hematology & Oncology

## 2016-03-11 ENCOUNTER — Encounter: Payer: Self-pay | Admitting: Hematology & Oncology

## 2016-03-11 VITALS — BP 133/82 | HR 75 | Temp 98.2°F | Resp 16 | Ht 63.0 in | Wt 153.0 lb

## 2016-03-11 VITALS — BP 120/77 | HR 62

## 2016-03-11 DIAGNOSIS — D510 Vitamin B12 deficiency anemia due to intrinsic factor deficiency: Secondary | ICD-10-CM

## 2016-03-11 DIAGNOSIS — K912 Postsurgical malabsorption, not elsewhere classified: Secondary | ICD-10-CM

## 2016-03-11 DIAGNOSIS — Z903 Acquired absence of stomach [part of]: Secondary | ICD-10-CM

## 2016-03-11 DIAGNOSIS — D509 Iron deficiency anemia, unspecified: Secondary | ICD-10-CM

## 2016-03-11 MED ORDER — SODIUM CHLORIDE 0.9 % IV SOLN
Freq: Once | INTRAVENOUS | Status: AC
  Administered 2016-03-11: 15:00:00 via INTRAVENOUS

## 2016-03-11 MED ORDER — SODIUM CHLORIDE 0.9 % IV SOLN
510.0000 mg | Freq: Once | INTRAVENOUS | Status: AC
  Administered 2016-03-11: 510 mg via INTRAVENOUS
  Filled 2016-03-11: qty 17

## 2016-03-11 MED ORDER — CYANOCOBALAMIN 1000 MCG/ML IJ SOLN
INTRAMUSCULAR | Status: AC
  Filled 2016-03-11: qty 1

## 2016-03-11 MED ORDER — CYANOCOBALAMIN 1000 MCG/ML IJ SOLN
1000.0000 ug | Freq: Once | INTRAMUSCULAR | Status: AC
  Administered 2016-03-11: 1000 ug via INTRAMUSCULAR

## 2016-03-11 NOTE — Patient Instructions (Addendum)
Ferumoxytol injection What is this medicine? FERUMOXYTOL is an iron complex. Iron is used to make healthy red blood cells, which carry oxygen and nutrients throughout the body. This medicine is used to treat iron deficiency anemia in people with chronic kidney disease. This medicine may be used for other purposes; ask your health care provider or pharmacist if you have questions. What should I tell my health care provider before I take this medicine? They need to know if you have any of these conditions: -anemia not caused by low iron levels -high levels of iron in the blood -magnetic resonance imaging (MRI) test scheduled -an unusual or allergic reaction to iron, other medicines, foods, dyes, or preservatives -pregnant or trying to get pregnant -breast-feeding How should I use this medicine? This medicine is for injection into a vein. It is given by a health care professional in a hospital or clinic setting. Talk to your pediatrician regarding the use of this medicine in children. Special care may be needed. Overdosage: If you think you have taken too much of this medicine contact a poison control center or emergency room at once. NOTE: This medicine is only for you. Do not share this medicine with others. What if I miss a dose? It is important not to miss your dose. Call your doctor or health care professional if you are unable to keep an appointment. What may interact with this medicine? This medicine may interact with the following medications: -other iron products This list may not describe all possible interactions. Give your health care provider a list of all the medicines, herbs, non-prescription drugs, or dietary supplements you use. Also tell them if you smoke, drink alcohol, or use illegal drugs. Some items may interact with your medicine. What should I watch for while using this medicine? Visit your doctor or healthcare professional regularly. Tell your doctor or healthcare  professional if your symptoms do not start to get better or if they get worse. You may need blood work done while you are taking this medicine. You may need to follow a special diet. Talk to your doctor. Foods that contain iron include: whole grains/cereals, dried fruits, beans, or peas, leafy green vegetables, and organ meats (liver, kidney). What side effects may I notice from receiving this medicine? Side effects that you should report to your doctor or health care professional as soon as possible: -allergic reactions like skin rash, itching or hives, swelling of the face, lips, or tongue -breathing problems -changes in blood pressure -feeling faint or lightheaded, falls -fever or chills -flushing, sweating, or hot feelings -swelling of the ankles or feet Side effects that usually do not require medical attention (Report these to your doctor or health care professional if they continue or are bothersome.): -diarrhea -headache -nausea, vomiting -stomach pain This list may not describe all possible side effects. Call your doctor for medical advice about side effects. You may report side effects to FDA at 1-800-FDA-1088. Where should I keep my medicine? This drug is given in a hospital or clinic and will not be stored at home. NOTE: This sheet is a summary. It may not cover all possible information. If you have questions about this medicine, talk to your doctor, pharmacist, or health care provider.    2016, Elsevier/Gold Standard. (2012-07-06 15:23:36) Cyanocobalamin, Vitamin B12 injection What is this medicine? CYANOCOBALAMIN (sye an oh koe BAL a min) is a man made form of vitamin B12. Vitamin B12 is used in the growth of healthy blood cells, nerve cells,   and proteins in the body. It also helps with the metabolism of fats and carbohydrates. This medicine is used to treat people who can not absorb vitamin B12. This medicine may be used for other purposes; ask your health care provider or  pharmacist if you have questions. What should I tell my health care provider before I take this medicine? They need to know if you have any of these conditions: -kidney disease -Leber's disease -megaloblastic anemia -an unusual or allergic reaction to cyanocobalamin, cobalt, other medicines, foods, dyes, or preservatives -pregnant or trying to get pregnant -breast-feeding How should I use this medicine? This medicine is injected into a muscle or deeply under the skin. It is usually given by a health care professional in a clinic or doctor's office. However, your doctor may teach you how to inject yourself. Follow all instructions. Talk to your pediatrician regarding the use of this medicine in children. Special care may be needed. Overdosage: If you think you have taken too much of this medicine contact a poison control center or emergency room at once. NOTE: This medicine is only for you. Do not share this medicine with others. What if I miss a dose? If you are given your dose at a clinic or doctor's office, call to reschedule your appointment. If you give your own injections and you miss a dose, take it as soon as you can. If it is almost time for your next dose, take only that dose. Do not take double or extra doses. What may interact with this medicine? -colchicine -heavy alcohol intake This list may not describe all possible interactions. Give your health care provider a list of all the medicines, herbs, non-prescription drugs, or dietary supplements you use. Also tell them if you smoke, drink alcohol, or use illegal drugs. Some items may interact with your medicine. What should I watch for while using this medicine? Visit your doctor or health care professional regularly. You may need blood work done while you are taking this medicine. You may need to follow a special diet. Talk to your doctor. Limit your alcohol intake and avoid smoking to get the best benefit. What side effects may I  notice from receiving this medicine? Side effects that you should report to your doctor or health care professional as soon as possible: -allergic reactions like skin rash, itching or hives, swelling of the face, lips, or tongue -blue tint to skin -chest tightness, pain -difficulty breathing, wheezing -dizziness -red, swollen painful area on the leg Side effects that usually do not require medical attention (report to your doctor or health care professional if they continue or are bothersome): -diarrhea -headache This list may not describe all possible side effects. Call your doctor for medical advice about side effects. You may report side effects to FDA at 1-800-FDA-1088. Where should I keep my medicine? Keep out of the reach of children. Store at room temperature between 15 and 30 degrees C (59 and 85 degrees F). Protect from light. Throw away any unused medicine after the expiration date. NOTE: This sheet is a summary. It may not cover all possible information. If you have questions about this medicine, talk to your doctor, pharmacist, or health care provider.    2016, Elsevier/Gold Standard. (2008-03-03 22:10:20)  

## 2016-03-11 NOTE — Progress Notes (Signed)
Hematology and Oncology Follow Up Visit  Megan Davila [id number] 05/19/1966 50 y.o. 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  Iron deficiency anemia secondary to past menometrorrhagia  Current Therapy:    IV iron as indicated-last dose July 2016     Interim History:  Megan Davila is back for follow-up. She is feeling tired again. This happens when her iron is low.  She has some iron studies done couple days ago. Her saturation was only 6%. Total iron was 22.  She has the gastric bypass. 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 She has intermittent iron deficiency.  She's had no bleeding. She's had no weight loss or weight gain. There's been no change in her medications.  She's not noted any problems with rashes. She's had no change in bowel or bladder habits.   Overall, her performance status is ECOG 0.  Medications:  Current outpatient prescriptions:  .  ALPRAZolam (XANAX) 1 MG tablet, Take 1 mg by mouth 2 (two) times daily as needed. Anxiety  , Disp: , Rfl:  .  cyanocobalamin (,VITAMIN B-12,) 1000 MCG/ML injection, Monthly injection, Disp: , Rfl: 0 .  DEXILANT 60 MG capsule, Take 60 mg by mouth daily., Disp: , Rfl:  .  divalproex (DEPAKOTE) 250 MG DR tablet, Take 1 tablet (250 mg total) by mouth at bedtime., Disp: 90 tablet, Rfl: 4 .  ergocalciferol (VITAMIN D2) 50000 UNITS capsule, Take 50,000 Units by mouth daily. She is taking daily - pr pt, Disp: , Rfl:  .  fexofenadine (ALLEGRA) 180 MG tablet, Take 180 mg by mouth daily as needed. allergies , Disp: , Rfl:  .  imiquimod (ALDARA) 5 % cream, APPLY ENTIRE PACKET OF CREAM TO AFFECTED AREAS 3 TIMES WEEKLY. AP...  (REFER TO PRESCRIPTION NOTES)., Disp: , Rfl: 0 .  metroNIDAZOLE (FLAGYL) 500 MG tablet, Take 1 tablet (500 mg total) by mouth 2 (two) times daily., Disp: 14 tablet, Rfl: 0 .  Multiple Vitamins-Minerals (MULTIVITAMIN WITH  MINERALS) tablet, Take 1 tablet by mouth daily.  , Disp: , Rfl:  .  ranitidine (ZANTAC) 150 MG capsule, , Disp: , Rfl:  .  thiamine (VITAMIN B-1) 50 MG tablet, daily., Disp: , Rfl: 0 .  topiramate (TOPAMAX) 50 MG tablet, take 1 tablet by mouth every morning and take 2 tablets by mouth every evening, Disp: 270 tablet, Rfl: 4 .  venlafaxine XR (EFFEXOR-XR) 150 MG 24 hr capsule, Take 150 mg by mouth daily., Disp: , Rfl:  .  zolmitriptan (ZOMIG-ZMT) 5 MG disintegrating tablet, PLACE 1 TABLET BY MOUTH ON TONGUE AT ONSET OF HEADACHE; MAY REPEAT x 1 AFTER 2 HOURS; MAX 2 TABS IN 24 HOURS, Disp: 8 tablet, Rfl: 12 .  lansoprazole (PREVACID) 30 MG capsule, Take 30 mg by mouth daily as needed. Acid reflux  , Disp: , Rfl:   Allergies:  Allergies  Allergen Reactions  . Naproxen Anaphylaxis  . Monistat [Miconazole] Other (See Comments)    "burning"   . Sulfa Antibiotics Hives    Past Medical History, Surgical history, Social history, and Family History were reviewed and updated.  Review of Systems: As above  Physical Exam:  height is 5\' 3"  (1.6 m) and weight is 153 lb (69.4 kg). Her oral temperature is 98.2 F (36.8 C). Her blood pressure is 133/82 and her pulse is 75. Her respiration is 16.   Wt Readings from Last 3 Encounters:  03/11/16 153 lb (  69.4 kg)  12/31/15 144 lb (65.318 kg)  11/24/15 147 lb 9.6 oz (66.951 kg)     Well-developed well-nourished white female in no obvious distress. Head and neck exam shows no ocular or oral lesions. She has no palpable cervical or supraclavicular lymph nodes.Thyroid is nonpalpable. Lungs are clear. Cardiac exam regular rate and rhythm with no murmurs, rubs or bruits. Abdomen is soft. She has good bowel sounds. There is no fluid wave. There is no palpable liver or spleen tip. Extremities shows no clubbing, cyanosis or edema. Back exam shows no tenderness over the spine, ribs or hips. Skin exam shows no rashes, ecchymoses or petechia. Neurological exam shows  no focal neurological deficits.  Lab Results  Component Value Date   WBC 6.2 03/09/2016   HGB 13.8 03/09/2016   HCT 41.9 03/09/2016   MCV 93.5 03/09/2016   PLT 282 03/09/2016     Chemistry      Component Value Date/Time   NA 137 09/03/2015 2050   K 3.4* 09/03/2015 2050   CL 103 09/03/2015 2050   CO2 25 09/03/2015 2050   BUN 22* 09/03/2015 2050   CREATININE 0.78 09/03/2015 2050      Component Value Date/Time   CALCIUM 9.0 09/03/2015 2050   ALKPHOS 51 09/03/2015 2050   AST 17 09/03/2015 2050   ALT 11* 09/03/2015 2050   BILITOT 0.4 09/03/2015 2050         Impression and Plan: Megan Davila is 50 year old white female with history of gastric bypass. She had gastric bypass surgery in May.  Clearly, her iron studies are low. In no she's not that anemic, I think it is just a matter of time before she does become more anemic.  We will go ahead and give her some iron studies today.  I will go ahead and plan to get her back in 2 months. I think we have to be a little bit more aggressive in fun her along. I just do not want to see her get to fatigue and weak with low iron. Because of her bypass, there is no way that she will absorb iron and can drop her blood count quickly.  [name], MD 4/7/20172:41 PM

## 2016-03-21 ENCOUNTER — Ambulatory Visit (HOSPITAL_BASED_OUTPATIENT_CLINIC_OR_DEPARTMENT_OTHER): Payer: 59

## 2016-03-21 VITALS — BP 115/71 | HR 71 | Temp 98.2°F | Resp 18

## 2016-03-21 DIAGNOSIS — D509 Iron deficiency anemia, unspecified: Secondary | ICD-10-CM

## 2016-03-21 DIAGNOSIS — D510 Vitamin B12 deficiency anemia due to intrinsic factor deficiency: Secondary | ICD-10-CM

## 2016-03-21 MED ORDER — SODIUM CHLORIDE 0.9 % IV SOLN
510.0000 mg | Freq: Once | INTRAVENOUS | Status: AC
  Administered 2016-03-21: 510 mg via INTRAVENOUS
  Filled 2016-03-21: qty 17

## 2016-03-21 MED ORDER — SODIUM CHLORIDE 0.9 % IV SOLN
INTRAVENOUS | Status: DC
  Administered 2016-03-21: 14:00:00 via INTRAVENOUS

## 2016-03-21 NOTE — Patient Instructions (Signed)

## 2016-05-11 ENCOUNTER — Other Ambulatory Visit (HOSPITAL_BASED_OUTPATIENT_CLINIC_OR_DEPARTMENT_OTHER): Payer: 59

## 2016-05-11 DIAGNOSIS — D509 Iron deficiency anemia, unspecified: Secondary | ICD-10-CM

## 2016-05-11 DIAGNOSIS — D510 Vitamin B12 deficiency anemia due to intrinsic factor deficiency: Secondary | ICD-10-CM

## 2016-05-11 LAB — CBC & DIFF AND RETIC
BASO%: 0.1 % (ref 0.0–2.0)
Basophils Absolute: 0 10*3/uL (ref 0.0–0.1)
EOS%: 1.7 % (ref 0.0–7.0)
Eosinophils Absolute: 0.1 10*3/uL (ref 0.0–0.5)
HCT: 42.1 % (ref 34.8–46.6)
HGB: 14 g/dL (ref 11.6–15.9)
IMMATURE RETIC FRACT: 2.4 % (ref 1.60–10.00)
LYMPH#: 2.1 10*3/uL (ref 0.9–3.3)
LYMPH%: 30.2 % (ref 14.0–49.7)
MCH: 32 pg (ref 25.1–34.0)
MCHC: 33.3 g/dL (ref 31.5–36.0)
MCV: 96.3 fL (ref 79.5–101.0)
MONO#: 0.4 10*3/uL (ref 0.1–0.9)
MONO%: 5.8 % (ref 0.0–14.0)
NEUT%: 62.2 % (ref 38.4–76.8)
NEUTROS ABS: 4.4 10*3/uL (ref 1.5–6.5)
PLATELETS: 250 10*3/uL (ref 145–400)
RBC: 4.37 10*6/uL (ref 3.70–5.45)
RDW: 12.8 % (ref 11.2–14.5)
RETIC CT ABS: 43.26 10*3/uL (ref 33.70–90.70)
Retic %: 0.99 % (ref 0.70–2.10)
WBC: 7 10*3/uL (ref 3.9–10.3)

## 2016-05-11 LAB — IRON AND TIBC
%SAT: 21 % (ref 21–57)
Iron: 48 ug/dL (ref 41–142)
TIBC: 233 ug/dL — ABNORMAL LOW (ref 236–444)
UIBC: 185 ug/dL (ref 120–384)

## 2016-05-11 LAB — COMPREHENSIVE METABOLIC PANEL
ANION GAP: 7 meq/L (ref 3–11)
AST: 13 U/L (ref 5–34)
Albumin: 3.6 g/dL (ref 3.5–5.0)
Alkaline Phosphatase: 48 U/L (ref 40–150)
BUN: 15.1 mg/dL (ref 7.0–26.0)
CHLORIDE: 108 meq/L (ref 98–109)
CO2: 26 meq/L (ref 22–29)
CREATININE: 0.8 mg/dL (ref 0.6–1.1)
Calcium: 9.1 mg/dL (ref 8.4–10.4)
GLUCOSE: 106 mg/dL (ref 70–140)
Potassium: 3.9 mEq/L (ref 3.5–5.1)
SODIUM: 141 meq/L (ref 136–145)
TOTAL PROTEIN: 6.5 g/dL (ref 6.4–8.3)

## 2016-05-11 LAB — FERRITIN: FERRITIN: 252 ng/mL (ref 9–269)

## 2016-05-12 ENCOUNTER — Telehealth: Payer: Self-pay | Admitting: *Deleted

## 2016-05-12 LAB — VITAMIN B12: VITAMIN B 12: 177 pg/mL — AB (ref 211–946)

## 2016-05-12 NOTE — Telephone Encounter (Addendum)
Patient aware of results.   ----- Message from [name], MD sent at 05/11/2016  3:14 PM EDT ----- Call - iron is better!! Megan Davila

## 2016-05-13 ENCOUNTER — Ambulatory Visit (HOSPITAL_BASED_OUTPATIENT_CLINIC_OR_DEPARTMENT_OTHER): Payer: 59 | Admitting: Family

## 2016-05-13 ENCOUNTER — Encounter: Payer: Self-pay | Admitting: Hematology & Oncology

## 2016-05-13 ENCOUNTER — Ambulatory Visit (HOSPITAL_BASED_OUTPATIENT_CLINIC_OR_DEPARTMENT_OTHER): Payer: 59

## 2016-05-13 VITALS — BP 108/67 | HR 76 | Temp 98.0°F | Resp 16 | Ht 63.0 in | Wt 156.0 lb

## 2016-05-13 DIAGNOSIS — D510 Vitamin B12 deficiency anemia due to intrinsic factor deficiency: Secondary | ICD-10-CM

## 2016-05-13 DIAGNOSIS — K912 Postsurgical malabsorption, not elsewhere classified: Secondary | ICD-10-CM

## 2016-05-13 DIAGNOSIS — D509 Iron deficiency anemia, unspecified: Secondary | ICD-10-CM

## 2016-05-13 DIAGNOSIS — Z903 Acquired absence of stomach [part of]: Secondary | ICD-10-CM

## 2016-05-13 DIAGNOSIS — D519 Vitamin B12 deficiency anemia, unspecified: Secondary | ICD-10-CM

## 2016-05-13 MED ORDER — SODIUM CHLORIDE 0.9 % IV SOLN
Freq: Once | INTRAVENOUS | Status: AC
  Administered 2016-05-13: 17:00:00 via INTRAVENOUS

## 2016-05-13 MED ORDER — SODIUM CHLORIDE 0.9 % IV SOLN
510.0000 mg | Freq: Once | INTRAVENOUS | Status: AC
  Administered 2016-05-13: 510 mg via INTRAVENOUS
  Filled 2016-05-13: qty 17

## 2016-05-13 NOTE — Patient Instructions (Signed)

## 2016-05-13 NOTE — Progress Notes (Signed)
Hematology and Oncology Follow Up Visit  Megan Davila [id number] 07/25/1966 50 y.o. 05/13/2016   Principle Diagnosis:  Iron deficiency anemia secondary to malabsorption Pernicious anemia saner to gastric bypass. Iron deficiency anemia secondary to past menometrorrhagia  Current Therapy:   IV iron as indicated - 2 doses of Feraheme in April  Vitamin B 12 1,000 mcg     Interim History:  Megan Davila is here today for a follow-up. She is having some fatigue and intermittent numbness and tingling in her hands and feet. She states that she feels like she "needs an iron infusion." Her iron saturation is 21%. Her B 12 is low at 177.  No chills, n/v, cough, rash, dizziness, SOB, chest pain, palpitations, abdominal pain or changes in bowel or bladder habits.  She is eating healthy and staying well hydrated. Her weight is stable.  No swelling or tenderness in her extremities. No c/o joint aches or pain.   Medications:    Medication List       This list is accurate as of: 05/13/16  3:38 PM.  Always use your most recent med list.               ALPRAZolam 1 MG tablet  Commonly known as:  XANAX  Take 1 mg by mouth 2 (two) times daily as needed. Anxiety     cyanocobalamin 1000 MCG/ML injection  Commonly known as:  (VITAMIN B-12)  Monthly injection     DEXILANT 60 MG capsule  Generic drug:  dexlansoprazole  Take 60 mg by mouth daily.     divalproex 250 MG DR tablet  Commonly known as:  DEPAKOTE  Take 1 tablet (250 mg total) by mouth at bedtime.     ergocalciferol 50000 units capsule  Commonly known as:  VITAMIN D2  Take 50,000 Units by mouth daily. She is taking daily - pr pt     fexofenadine 180 MG tablet  Commonly known as:  ALLEGRA  Take 180 mg by mouth daily as needed. allergies     imiquimod 5 % cream  Commonly known as:  ALDARA  APPLY ENTIRE PACKET OF CREAM TO AFFECTED AREAS 3 TIMES WEEKLY. AP...  (REFER TO PRESCRIPTION NOTES).     lansoprazole 30 MG capsule  Commonly  known as:  PREVACID  Take 30 mg by mouth daily as needed. Acid reflux     multivitamin with minerals tablet  Take 1 tablet by mouth daily.     ranitidine 150 MG capsule  Commonly known as:  ZANTAC     thiamine 50 MG tablet  Commonly known as:  VITAMIN B-1  daily.     topiramate 50 MG tablet  Commonly known as:  TOPAMAX  take 1 tablet by mouth every morning and take 2 tablets by mouth every evening     venlafaxine XR 150 MG 24 hr capsule  Commonly known as:  EFFEXOR-XR  Take 150 mg by mouth daily.     zolmitriptan 5 MG disintegrating tablet  Commonly known as:  ZOMIG-ZMT  PLACE 1 TABLET BY MOUTH ON TONGUE AT ONSET OF HEADACHE; MAY REPEAT x 1 AFTER 2 HOURS; MAX 2 TABS IN 24 HOURS        Allergies:  Allergies  Allergen Reactions  . Naproxen Anaphylaxis  . Monistat [Miconazole] Other (See Comments)    "burning"   . Sulfa Antibiotics Hives    Past Medical History, Surgical history, Social history, and Family History were reviewed and updated.  Review of Systems:  All other 10 point review of systems is negative.   Physical Exam:  height is 5\' 3"  (1.6 m) and weight is 156 lb (70.761 kg). Her oral temperature is 98 F (36.7 C). Her blood pressure is 108/67 and her pulse is 76. Her respiration is 16.   Wt Readings from Last 3 Encounters:  05/13/16 156 lb (70.761 kg)  03/11/16 153 lb (69.4 kg)  12/31/15 144 lb (65.318 kg)    Ocular: Sclerae unicteric, pupils equal, round and reactive to light Ear-nose-throat: Oropharynx clear, dentition fair Lymphatic: No cervical supraclavicular or axillary adenopathy Lungs no rales or rhonchi, good excursion bilaterally Heart regular rate and rhythm, no murmur appreciated Abd soft, nontender, positive bowel sounds MSK no focal spinal tenderness, no joint edema Neuro: non-focal, well-oriented, appropriate affect Breasts: Deferred  Lab Results  Component Value Date   WBC 7.0 05/11/2016   HGB 14.0 05/11/2016   HCT 42.1  05/11/2016   MCV 96.3 05/11/2016   PLT 250 05/11/2016   Lab Results  Component Value Date   FERRITIN 252 05/11/2016   IRON 48 05/11/2016   TIBC 233* 05/11/2016   UIBC 185 05/11/2016   IRONPCTSAT 21 05/11/2016   Lab Results  Component Value Date   RETICCTPCT 0.99 05/11/2016   RBC 4.37 05/11/2016   RETICCTABS 43.26 05/11/2016   No results found for: KPAFRELGTCHN, LAMBDASER, KAPLAMBRATIO No results found for: IGGSERUM, IGA, IGMSERUM No results found for: Odetta Pink, SPEI   Chemistry      Component Value Date/Time   NA 141 05/11/2016 1332   NA 137 09/03/2015 2050   K 3.9 05/11/2016 1332   K 3.4* 09/03/2015 2050   CL 103 09/03/2015 2050   CO2 26 05/11/2016 1332   CO2 25 09/03/2015 2050   BUN 15.1 05/11/2016 1332   BUN 22* 09/03/2015 2050   CREATININE 0.8 05/11/2016 1332   CREATININE 0.78 09/03/2015 2050      Component Value Date/Time   CALCIUM 9.1 05/11/2016 1332   CALCIUM 9.0 09/03/2015 2050   ALKPHOS 48 05/11/2016 1332   ALKPHOS 51 09/03/2015 2050   AST 13 05/11/2016 1332   AST 17 09/03/2015 2050   ALT <9 05/11/2016 1332   ALT 11* 09/03/2015 2050   BILITOT <0.30 05/11/2016 1332   BILITOT 0.4 09/03/2015 2050     Impression and Plan: Megan Davila is 50 yo white female with iron deficiency anemia secondary to gastric bypass. She is symptomatic with fatigue and numbness and tingling in her extremities. Her iron saturation is 21% so we will go ahead and give her a dose fo Feraheme while she is here today. She will also get B 12 today.  We will plan to see her back in 2 months for labs and follow-up.  She will contact us with any questions or concerns. We can certainly see her sooner if need be.   Eliezer Bottom, NP 6/9/20173:38 PM

## 2016-05-18 ENCOUNTER — Ambulatory Visit (HOSPITAL_BASED_OUTPATIENT_CLINIC_OR_DEPARTMENT_OTHER): Payer: 59

## 2016-05-18 VITALS — BP 120/78 | HR 70 | Temp 98.8°F | Resp 18

## 2016-05-18 DIAGNOSIS — D510 Vitamin B12 deficiency anemia due to intrinsic factor deficiency: Secondary | ICD-10-CM | POA: Diagnosis not present

## 2016-05-18 DIAGNOSIS — K912 Postsurgical malabsorption, not elsewhere classified: Secondary | ICD-10-CM

## 2016-05-18 DIAGNOSIS — Z903 Acquired absence of stomach [part of]: Secondary | ICD-10-CM

## 2016-05-18 MED ORDER — CYANOCOBALAMIN 1000 MCG/ML IJ SOLN
1000.0000 ug | Freq: Once | INTRAMUSCULAR | Status: AC
  Administered 2016-05-18: 1000 ug via INTRAMUSCULAR

## 2016-05-18 NOTE — Patient Instructions (Signed)

## 2016-05-23 ENCOUNTER — Ambulatory Visit: Payer: 59 | Admitting: Diagnostic Neuroimaging

## 2016-05-25 ENCOUNTER — Encounter: Payer: Self-pay | Admitting: Diagnostic Neuroimaging

## 2016-06-08 ENCOUNTER — Other Ambulatory Visit: Payer: Self-pay | Admitting: *Deleted

## 2016-06-08 MED ORDER — DIVALPROEX SODIUM 250 MG PO DR TAB: 250.0000 mg | DELAYED_RELEASE_TABLET | Freq: Every day | ORAL | Status: DC

## 2016-06-22 ENCOUNTER — Ambulatory Visit: Payer: 59 | Admitting: Diagnostic Neuroimaging

## 2016-06-27 ENCOUNTER — Other Ambulatory Visit: Payer: Self-pay | Admitting: *Deleted

## 2016-06-27 MED ORDER — TOPIRAMATE 50 MG PO TABS: ORAL_TABLET | ORAL | 4 refills | Status: DC

## 2016-07-05 ENCOUNTER — Ambulatory Visit: Payer: 59

## 2016-07-06 ENCOUNTER — Ambulatory Visit: Payer: 59 | Admitting: Family

## 2016-07-06 ENCOUNTER — Ambulatory Visit: Payer: 59 | Admitting: Diagnostic Neuroimaging

## 2016-07-06 ENCOUNTER — Other Ambulatory Visit (HOSPITAL_BASED_OUTPATIENT_CLINIC_OR_DEPARTMENT_OTHER): Payer: 59

## 2016-07-06 ENCOUNTER — Ambulatory Visit (HOSPITAL_BASED_OUTPATIENT_CLINIC_OR_DEPARTMENT_OTHER): Payer: 59

## 2016-07-06 ENCOUNTER — Other Ambulatory Visit: Payer: 59

## 2016-07-06 VITALS — BP 123/73 | HR 68 | Temp 98.2°F | Resp 20

## 2016-07-06 DIAGNOSIS — D510 Vitamin B12 deficiency anemia due to intrinsic factor deficiency: Secondary | ICD-10-CM

## 2016-07-06 DIAGNOSIS — D509 Iron deficiency anemia, unspecified: Secondary | ICD-10-CM | POA: Diagnosis not present

## 2016-07-06 DIAGNOSIS — K912 Postsurgical malabsorption, not elsewhere classified: Secondary | ICD-10-CM

## 2016-07-06 DIAGNOSIS — Z903 Acquired absence of stomach [part of]: Secondary | ICD-10-CM

## 2016-07-06 LAB — CBC WITH DIFFERENTIAL/PLATELET
BASO%: 0.5 % (ref 0.0–2.0)
Basophils Absolute: 0 10*3/uL (ref 0.0–0.1)
EOS ABS: 0.2 10*3/uL (ref 0.0–0.5)
EOS%: 3 % (ref 0.0–7.0)
HCT: 39.9 % (ref 34.8–46.6)
HGB: 13.2 g/dL (ref 11.6–15.9)
LYMPH%: 36.6 % (ref 14.0–49.7)
MCH: 32.2 pg (ref 25.1–34.0)
MCHC: 33.1 g/dL (ref 31.5–36.0)
MCV: 97.2 fL (ref 79.5–101.0)
MONO#: 0.6 10*3/uL (ref 0.1–0.9)
MONO%: 7.9 % (ref 0.0–14.0)
NEUT%: 52 % (ref 38.4–76.8)
NEUTROS ABS: 3.8 10*3/uL (ref 1.5–6.5)
PLATELETS: 267 10*3/uL (ref 145–400)
RBC: 4.1 10*6/uL (ref 3.70–5.45)
RDW: 12.5 % (ref 11.2–14.5)
WBC: 7.3 10*3/uL (ref 3.9–10.3)
lymph#: 2.7 10*3/uL (ref 0.9–3.3)

## 2016-07-06 LAB — FERRITIN: Ferritin: 305 ng/ml — ABNORMAL HIGH (ref 9–269)

## 2016-07-06 LAB — COMPREHENSIVE METABOLIC PANEL
ALBUMIN: 3.4 g/dL — AB (ref 3.5–5.0)
ALK PHOS: 47 U/L (ref 40–150)
ALT: 10 U/L (ref 0–55)
ANION GAP: 8 meq/L (ref 3–11)
AST: 12 U/L (ref 5–34)
BUN: 10.2 mg/dL (ref 7.0–26.0)
CALCIUM: 8.6 mg/dL (ref 8.4–10.4)
CHLORIDE: 109 meq/L (ref 98–109)
CO2: 25 mEq/L (ref 22–29)
Creatinine: 0.7 mg/dL (ref 0.6–1.1)
Glucose: 67 mg/dl — ABNORMAL LOW (ref 70–140)
POTASSIUM: 3.7 meq/L (ref 3.5–5.1)
Sodium: 142 mEq/L (ref 136–145)
Total Bilirubin: <0.3 mg/dL (ref 0.20–1.20)
Total Protein: 6.1 g/dL — ABNORMAL LOW (ref 6.4–8.3)

## 2016-07-06 LAB — IRON AND TIBC
%SAT: 27 % (ref 21–57)
IRON: 61 ug/dL (ref 41–142)
TIBC: 224 ug/dL — AB (ref 236–444)
UIBC: 163 ug/dL (ref 120–384)

## 2016-07-06 MED ORDER — CYANOCOBALAMIN 1000 MCG/ML IJ SOLN
1000.0000 ug | Freq: Once | INTRAMUSCULAR | Status: AC
  Administered 2016-07-06: 1000 ug via INTRAMUSCULAR

## 2016-07-06 NOTE — Patient Instructions (Signed)

## 2016-07-07 LAB — VITAMIN B12: Vitamin B12: 254 pg/mL (ref 211–946)

## 2016-07-07 LAB — RETICULOCYTES: Reticulocyte Count: 1.5 % (ref 0.6–2.6)

## 2016-07-08 ENCOUNTER — Encounter: Payer: Self-pay | Admitting: Family

## 2016-07-08 ENCOUNTER — Ambulatory Visit (HOSPITAL_BASED_OUTPATIENT_CLINIC_OR_DEPARTMENT_OTHER): Payer: 59

## 2016-07-08 ENCOUNTER — Ambulatory Visit (HOSPITAL_BASED_OUTPATIENT_CLINIC_OR_DEPARTMENT_OTHER): Payer: 59 | Admitting: Family

## 2016-07-08 VITALS — BP 111/75 | HR 84 | Temp 98.2°F | Resp 20 | Ht 63.0 in | Wt 158.0 lb

## 2016-07-08 VITALS — BP 128/66 | HR 60

## 2016-07-08 DIAGNOSIS — D509 Iron deficiency anemia, unspecified: Secondary | ICD-10-CM

## 2016-07-08 DIAGNOSIS — B379 Candidiasis, unspecified: Secondary | ICD-10-CM

## 2016-07-08 DIAGNOSIS — D510 Vitamin B12 deficiency anemia due to intrinsic factor deficiency: Secondary | ICD-10-CM

## 2016-07-08 DIAGNOSIS — D518 Other vitamin B12 deficiency anemias: Secondary | ICD-10-CM

## 2016-07-08 DIAGNOSIS — K912 Postsurgical malabsorption, not elsewhere classified: Secondary | ICD-10-CM

## 2016-07-08 DIAGNOSIS — Z903 Acquired absence of stomach [part of]: Secondary | ICD-10-CM

## 2016-07-08 MED ORDER — SODIUM CHLORIDE 0.9 % IV SOLN
Freq: Once | INTRAVENOUS | Status: AC
  Administered 2016-07-08: 16:00:00 via INTRAVENOUS

## 2016-07-08 MED ORDER — NYSTATIN 100000 UNIT/GM EX POWD: Freq: Two times a day (BID) | CUTANEOUS | 1 refills | Status: DC

## 2016-07-08 MED ORDER — SODIUM CHLORIDE 0.9 % IV SOLN
510.0000 mg | Freq: Once | INTRAVENOUS | Status: AC
  Administered 2016-07-08: 510 mg via INTRAVENOUS
  Filled 2016-07-08: qty 17

## 2016-07-08 NOTE — Patient Instructions (Signed)

## 2016-07-08 NOTE — Progress Notes (Signed)
Hematology and Oncology Follow Up Visit  Megan Davila [id number] February 04, 1966 50 y.o. 07/08/2016   Principle Diagnosis:  Iron deficiency anemia secondary to malabsorption Pernicious anemia saner to gastric bypass. Iron deficiency anemia secondary to past menometrorrhagia  Current Therapy:   IV iron as indicated - last received in June 2017 Vitamin B 12 1,000 mcg     Interim History:  Megan Davila is here today for a follow-up. She is still feeling quite fatigued and nad having chills. She received her B 12 injection earlier this week with her PCP. Her B 12 level was 254.  She received Feraheme in June. Her iron saturation is now 27% with a ferritin of 305.  Her cycles have been regular and not as heavy.  She has what appears to be candidiasis of the groin.  No fever, n/v, cough, rash, dizziness, SOB, chest pain, palpitations, abdominal pain or changes in bowel or bladder habits.  She continues to eat healthy and stay well hydrated. Her weight is stable.  No swelling, tenderness, numbness or tingling in her extremities at this time. No c/o joint aches or pain.   Medications:    Medication List       Accurate as of 07/08/16  3:32 PM. Always use your most recent med list.          ALPRAZolam 1 MG tablet Commonly known as:  XANAX Take 1 mg by mouth 2 (two) times daily as needed. Anxiety   cyanocobalamin 1000 MCG/ML injection Commonly known as:  (VITAMIN B-12) Monthly injection   DEXILANT 60 MG capsule Generic drug:  dexlansoprazole Take 60 mg by mouth daily.   divalproex 250 MG DR tablet Commonly known as:  DEPAKOTE Take 1 tablet (250 mg total) by mouth at bedtime.   ergocalciferol 50000 units capsule Commonly known as:  VITAMIN D2 Take 50,000 Units by mouth daily. She is taking daily - pr pt   fexofenadine 180 MG tablet Commonly known as:  ALLEGRA Take 180 mg by mouth daily as needed. allergies   lansoprazole 30 MG capsule Commonly known as:  PREVACID Take 30 mg by  mouth daily as needed. Acid reflux   multivitamin with minerals tablet Take 1 tablet by mouth daily.   ranitidine 150 MG capsule Commonly known as:  ZANTAC   thiamine 50 MG tablet Commonly known as:  VITAMIN B-1 daily.   topiramate 50 MG tablet Commonly known as:  TOPAMAX take 1 tablet by mouth every morning and take 2 tablets by mouth every evening   venlafaxine XR 150 MG 24 hr capsule Commonly known as:  EFFEXOR-XR Take 150 mg by mouth daily.   zolmitriptan 5 MG disintegrating tablet Commonly known as:  ZOMIG-ZMT PLACE 1 TABLET BY MOUTH ON TONGUE AT ONSET OF HEADACHE; MAY REPEAT x 1 AFTER 2 HOURS; MAX 2 TABS IN 24 HOURS       Allergies:  Allergies  Allergen Reactions  . Naproxen Anaphylaxis  . Monistat [Miconazole] Other (See Comments)    "burning"   . Sulfa Antibiotics Hives  . Tioconazole Itching    Past Medical History, Surgical history, Social history, and Family History were reviewed and updated.  Review of Systems: All other 10 point review of systems is negative.   Physical Exam:  height is 5\' 3"  (1.6 m) and weight is 158 lb (71.7 kg). Her oral temperature is 98.2 F (36.8 C). Her blood pressure is 111/75 and her pulse is 84. Her respiration is 20.   Wt Readings from  Last 3 Encounters:  07/08/16 158 lb (71.7 kg)  05/13/16 156 lb (70.8 kg)  03/11/16 153 lb (69.4 kg)    Ocular: Sclerae unicteric, pupils equal, round and reactive to light Ear-nose-throat: Oropharynx clear, dentition fair Lymphatic: No cervical supraclavicular or axillary adenopathy Lungs no rales or rhonchi, good excursion bilaterally Heart regular rate and rhythm, no murmur appreciated Abd soft, nontender, positive bowel sounds, no liver or spleen tip palpated on exam MSK no focal spinal tenderness, no joint edema Neuro: non-focal, well-oriented, appropriate affect Breasts: Deferred  Lab Results  Component Value Date   WBC 7.3 07/06/2016   HGB 13.2 07/06/2016   HCT 39.9  07/06/2016   MCV 97.2 07/06/2016   PLT 267 07/06/2016   Lab Results  Component Value Date   FERRITIN 305 (H) 07/06/2016   IRON 61 07/06/2016   TIBC 224 (L) 07/06/2016   UIBC 163 07/06/2016   IRONPCTSAT 27 07/06/2016   Lab Results  Component Value Date   RETICCTPCT 0.99 05/11/2016   RBC 4.10 07/06/2016   RETICCTABS 43.26 05/11/2016   No results found for: KPAFRELGTCHN, LAMBDASER, KAPLAMBRATIO No results found for: IGGSERUM, IGA, IGMSERUM No results found for: Odetta Pink, SPEI   Chemistry      Component Value Date/Time   NA 142 07/06/2016 1319   K 3.7 07/06/2016 1319   CL 103 09/03/2015 2050   CO2 25 07/06/2016 1319   BUN 10.2 07/06/2016 1319   CREATININE 0.7 07/06/2016 1319      Component Value Date/Time   CALCIUM 8.6 07/06/2016 1319   ALKPHOS 47 07/06/2016 1319   AST 12 07/06/2016 1319   ALT 10 07/06/2016 1319   BILITOT <0.30 07/06/2016 1319     Impression and Plan: Megan Davila is 50 yo white female with iron deficiency anemia secondary to gastric bypass. She is symptomatic with fatigue and chills. She received B 12 earlier this week.  She is symptomatic and iron saturation is 27%. We will give her a dose of Feraheme today.  Prescription for nystatin powder was sent to her pharmacy.  We will plan to see her back in 2 months for labs and follow-up.  She will contact us with any questions or concerns. We can certainly see her sooner if need be.   Eliezer Bottom, NP 8/4/20173:32 PM

## 2016-07-13 ENCOUNTER — Ambulatory Visit (INDEPENDENT_AMBULATORY_CARE_PROVIDER_SITE_OTHER): Payer: 59 | Admitting: Diagnostic Neuroimaging

## 2016-07-13 ENCOUNTER — Encounter (INDEPENDENT_AMBULATORY_CARE_PROVIDER_SITE_OTHER): Payer: Self-pay

## 2016-07-13 ENCOUNTER — Encounter: Payer: Self-pay | Admitting: Diagnostic Neuroimaging

## 2016-07-13 VITALS — BP 106/69 | HR 60 | Wt 157.4 lb

## 2016-07-13 DIAGNOSIS — G43109 Migraine with aura, not intractable, without status migrainosus: Secondary | ICD-10-CM | POA: Diagnosis not present

## 2016-07-13 MED ORDER — ZOLMITRIPTAN 5 MG PO TBDP: ORAL_TABLET | ORAL | 12 refills | Status: DC

## 2016-07-13 MED ORDER — TOPIRAMATE 50 MG PO TABS
ORAL_TABLET | ORAL | 4 refills | Status: DC
Start: 2016-07-13 — End: 2017-04-25

## 2016-07-13 MED ORDER — DIVALPROEX SODIUM 250 MG PO DR TAB: 250.0000 mg | DELAYED_RELEASE_TABLET | Freq: Every day | ORAL | 4 refills | Status: DC

## 2016-07-13 NOTE — Patient Instructions (Signed)
-  continue current medications

## 2016-07-13 NOTE — Progress Notes (Signed)
[facility]  PATIENT: Megan Davila DOB: 09/01/1966  REFERRING CLINICIAN:  HISTORY FROM: patient  REASON FOR VISIT: follow up   HISTORICAL  CHIEF COMPLAINT:  Chief Complaint  Patient presents with  . Migraine    rm  6" Have had a few migrianes in past year; have had to get 1-2 infusions in ED"  . Follow-up    1 year    HISTORY OF PRESENT ILLNESS:   UPDATE 07/13/16: Since last visit, only 1-2 major HA in last year needing ER evaluation. May have had total 6-8 migraine attacks all year. Overall doing well. Tolerating TPX, VPA and zolmitriptan. Still with low iron levels (managed by Dr Marin Olp).   UPDATE 05/20/15: Since last visit, only 6 HA in last 1 year. Had stricture dilation at [facility], complicated by perforation and repair in May 2016. Fortunately, overall doing well now.  UPDATE 05/21/14: Having migraine HAs 3 months out of per year. When she has migraine, it usually lasts up to 1 week at a time, and then she runs out of triptan. Using maxalt and zomig (alternating). Still on TPX and VPA for prevention. Still with stress related to work.   UPDATE 11/13/12 (JM):  She has been doing well without  any severe headaches until today she has a headache 6/10 with sharp pains behind her eyes. She has not taken any pain medications this morning.  She has had a 7 lb weight loss since last visit now 117lbs, has a history of poor absorption with oral medications.     UPDATE 09/20/12 (JM):  Returns to office since 2011, headaches in the last couple of months, has been persistent with headaches.  She had gastric bypass 2002, pregnant 6 years ago. Was 180 and is now 125lbs.  Has increased stress at work.  Tolerating Topamax well but feels that when she takes she has a loss of appetite.  Helpful with headache.  Increase sensitivity to hearing, photosensitivity, tinnitus and nausea.  She feels the air filters at work are causing headaches.  She is unable to state how often has severe  headaches.  Has pain behind both eyes.  Unable to take NSAID's secondary to anaphylaxis.  Seen PCP October 10 and prescribed Relapax but has not tried.    PRIOR HPI (04/01/10, VRP): 50 year old right-handed female with history of gastric bypass surgery presenting for evaluation of new onset severe left-sided headache on March 23, 2010. On March 23, 2010 patient developed sudden onset left-sided headache behind her left eye. This was associated with seeing spots, feeling nausea and photophobia/phonophobia.  Headache lasted for several days. On April 21 she was seen by her primary care physician who prescribed Maxalt (provided mild relief) and hydrocodone (no relief). On April 24 she was evaluated at [facility] emergency room. Since that time her headaches have resolved. She does report persistent mild neck pain and difficulty sleeping. She does report remote history of similar headaches 10 years ago. There is no family history of migraine headache. She denies numbness or weakness in arms or legs.  REVIEW OF SYSTEMS: Full 14 system review of systems performed and negative except: insomnia anemia fatigue murmur freq waking.   ALLERGIES: Allergies  Allergen Reactions  . Naproxen Anaphylaxis  . Monistat [Miconazole] Other (See Comments)    "burning"   . Sulfa Antibiotics Hives  . Tioconazole Itching    HOME MEDICATIONS: Outpatient Medications Prior to Visit  Medication Sig Dispense Refill  . ALPRAZolam (XANAX) 1 MG tablet Take  1 mg by mouth 2 (two) times daily as needed. Anxiety      . cyanocobalamin (,VITAMIN B-12,) 1000 MCG/ML injection Monthly injection  0  . DEXILANT 60 MG capsule Take 60 mg by mouth daily.    . ergocalciferol (VITAMIN D2) 50000 UNITS capsule Take 50,000 Units by mouth daily. She is taking daily - pr pt    . fexofenadine (ALLEGRA) 180 MG tablet Take 180 mg by mouth daily as needed. allergies     . lansoprazole (PREVACID) 30 MG capsule Take 30 mg by mouth daily as needed.  Acid reflux      . Multiple Vitamins-Minerals (MULTIVITAMIN WITH MINERALS) tablet Take 1 tablet by mouth daily.      Marland Kitchen nystatin (NYSTATIN) powder Apply topically 2 (two) times daily. 45 g 1  . ranitidine (ZANTAC) 150 MG capsule     . thiamine (VITAMIN B-1) 50 MG tablet daily.  0  . venlafaxine XR (EFFEXOR-XR) 150 MG 24 hr capsule Take 150 mg by mouth daily.    . divalproex (DEPAKOTE) 250 MG DR tablet Take 1 tablet (250 mg total) by mouth at bedtime. 90 tablet 4  . topiramate (TOPAMAX) 50 MG tablet take 1 tablet by mouth every morning and take 2 tablets by mouth every evening 270 tablet 4  . zolmitriptan (ZOMIG-ZMT) 5 MG disintegrating tablet PLACE 1 TABLET BY MOUTH ON TONGUE AT ONSET OF HEADACHE; MAY REPEAT x 1 AFTER 2 HOURS; MAX 2 TABS IN 24 HOURS 8 tablet 12   No facility-administered medications prior to visit.     PAST MEDICAL HISTORY: Past Medical History:  Diagnosis Date  . Abnormal Pap smear   . Anemia   . Anxiety   . Broken toe 12-22-15   middle toe right foot  . Cataract of both eyes   . Depression   . GERD (gastroesophageal reflux disease)   . Headache(784.0)   . Heart murmur   . History of kidney stones   . 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 Perforated bowel ([place])   . Pernicious anemia 06/19/2015  . PONV (postoperative nausea and vomiting)     PAST SURGICAL HISTORY: Past Surgical History:  Procedure Laterality Date  . CATARACT EXTRACTION, BILATERAL Bilateral 02/2016  . CERVICAL BIOPSY  W/ LOOP ELECTRODE EXCISION  11/2013   LGSIL  . 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 CESAREAN SECTION  2008  . CHOLECYSTECTOMY    . GASTRIC BYPASS  2002  . HYSTEROSCOPY W/D&C  10/14/2011   Procedure: DILATATION AND CURETTAGE (D&C) /HYSTEROSCOPY;  Surgeon: Arloa Koh;  Location: [facility];  Service: Gynecology;  Laterality: Bilateral;  . LASIK    . NOSE SURGERY    . STOMACH SURGERY  04/20/2015   dilation of "connection  between stomach and intestine", [facility]  . TONSILLECTOMY    . TONSILLECTOMY    . TUBAL LIGATION  2008    FAMILY HISTORY: Family History  Problem Relation Age of Onset  . Diabetes Mother   . Heart disease Father   . Social phobia Father   . Psychiatric Illness Father   . Hypertension Father   . Stroke Father   . Dementia Father   . Heart disease    . Diabetes    . Stroke    . Hypertension Maternal Grandmother   . Thyroid disease Maternal Grandmother   . Hypertension Paternal Grandmother   . Stroke Paternal Grandfather     SOCIAL HISTORY:  Social History   Social History  . Marital status: Divorced    Spouse name: N/A  . Number of children: 1  . Years of education: College   Occupational History  .  [employer]   Social History Main Topics  . Smoking status: Never Smoker  . Smokeless tobacco: Never Used  . Alcohol use 0.0 oz/week     Comment: Once a month-rarely  . Drug use: No  . Sexual activity: Yes    Partners: Male    Birth control/ protection: Surgical     Comment: tubal   Other Topics Concern  . Not on file   Social History Narrative   Pt lives at home with her family.   Caffeine Use: once daily     PHYSICAL EXAM  Vitals:   07/13/16 1525  BP: 106/69  Pulse: 60  Weight: 157 lb 6.4 oz (71.4 kg)    Not recorded      Body mass index is 27.88 kg/m.  GENERAL EXAM: Patient is in no distress; well developed, nourished and groomed; neck is supple  CARDIOVASCULAR: Regular rate and rhythm, no murmurs, no carotid bruits  NEUROLOGIC: MENTAL STATUS: awake, alert, language fluent, comprehension intact, naming intact, fund of knowledge appropriate CRANIAL NERVE:  pupils equal and reactive to light, visual fields full to confrontation, extraocular muscles intact, no nystagmus, facial sensation and strength symmetric, hearing intact, palate elevates symmetrically, uvula midline, shoulder shrug symmetric, tongue midline. MOTOR: normal bulk and  tone, full strength in the BUE, BLE SENSORY: normal and symmetric to light touch COORDINATION: finger-nose-finger, fine finger movements normal REFLEXES: deep tendon reflexes present and symmetric GAIT/STATION: narrow based gait     DIAGNOSTIC DATA (LABS, IMAGING, TESTING) - I reviewed patient records, labs, notes, testing and imaging myself where available.  Lab Results  Component Value Date   WBC 7.3 07/06/2016   HGB 13.2 07/06/2016   HCT 39.9 07/06/2016   MCV 97.2 07/06/2016   PLT 267 07/06/2016      Component Value Date/Time   NA 142 07/06/2016 1319   K 3.7 07/06/2016 1319   CL 103 09/03/2015 2050   CO2 25 07/06/2016 1319   GLUCOSE 67 (L) 07/06/2016 1319   BUN 10.2 07/06/2016 1319   CREATININE 0.7 07/06/2016 1319   CALCIUM 8.6 07/06/2016 1319   PROT 6.1 (L) 07/06/2016 1319   ALBUMIN 3.4 (L) 07/06/2016 1319   AST 12 07/06/2016 1319   ALT 10 07/06/2016 1319   ALKPHOS 47 07/06/2016 1319   BILITOT <0.30 07/06/2016 1319   GFRNONAA >60 09/03/2015 2050   GFRAA >60 09/03/2015 2050   No results found for: CHOL No results found for: HGBA1C Lab Results  Component Value Date   VITAMINB12 254 07/06/2016   No results found for: TSH  04/17/10 MRI brain - Essentially normal MRI brain.  There are few non-specific foci of gliosis which can be seen in association with chronic migraine headaches, and are of doubtful clinical significance.  04/17/10 MRA head - normal  01/09/13 CT head - normal    ASSESSMENT AND PLAN  50 y.o. year old female here with history of gastric bypass surgery and intermittent headaches, likely migraine with aura. Stable over last 1 year.   Dx:  Migraine with aura and without status migrainosus, not intractable    PLAN: I spent 15 minutes of face to face time with patient. Greater than 50% of time was spent in counseling and coordination of care with patient. In summary we discussed:  1. Continue TPX 50 / 100 2. Continue VPA 250mg  qhs 3. Continue  Zomig ZMT   Meds ordered this encounter  Medications  . divalproex (DEPAKOTE) 250 MG DR tablet    Sig: Take 1 tablet (250 mg total) by mouth at bedtime.    Dispense:  90 tablet    Refill:  4  . topiramate (TOPAMAX) 50 MG tablet    Sig: take 1 tablet by mouth every morning and take 2 tablets by mouth every evening    Dispense:  270 tablet    Refill:  4  . zolmitriptan (ZOMIG-ZMT) 5 MG disintegrating tablet    Sig: PLACE 1 TABLET BY MOUTH ON TONGUE AT ONSET OF HEADACHE; MAY REPEAT x 1 AFTER 2 HOURS; MAX 2 TABS IN 24 HOURS    Dispense:  8 tablet    Refill:  12   Return in about 1 year (around 07/13/2017).     Penni Bombard, MD [id number], [id number] PM Certified in Neurology, Neurophysiology and Neuroimaging  [facility] [address] [place], [place] [address] [phone]

## 2016-08-25 ENCOUNTER — Telehealth: Payer: Self-pay | Admitting: *Deleted

## 2016-08-25 NOTE — Telephone Encounter (Signed)
Dr Leta Baptist - FYI  Called patient back. She stated she has had a migraine for the past two days. She last took zomig this morning and it did not help. She stated the pain is in front of head and "9/10" on pain scale. She States a couple days ago at work, her coworker turning on "air machine" that sits right next to her and it is very loud and right next to her head. Her ears are also ringing.  Spoke to Dr Jaynee Eagles because Dr Leta Baptist not available at time of call. Dr Jaynee Eagles authorized infusion.   Advised patient to come at 330pm for infusion and no later. We are going to fit her in at this time. She verbalized understanding.

## 2016-08-26 NOTE — Telephone Encounter (Signed)
Pt called in states she still has a loud ringing in both ears. She states the infusion helped some but ringing is coming back . Should she continue with Zomig? Please call and advise

## 2016-08-26 NOTE — Telephone Encounter (Signed)
She has taken Zomig once today but the headache is still present.  She was trying not to repeat the medication because of her limited monthly supply and wanted to know if there is anything else to try. Per Dr. Leta Baptist, can try Tylenol but may need to repeat Zomig.  Pt agreeable.  She will also lay down to rest.

## 2016-08-31 MED ORDER — ELETRIPTAN HYDROBROMIDE 40 MG PO TABS: 40.0000 mg | ORAL_TABLET | ORAL | 12 refills | Status: DC | PRN

## 2016-08-31 NOTE — Telephone Encounter (Signed)
zomig changed to relpax. -VRP

## 2016-08-31 NOTE — Telephone Encounter (Signed)
Patient is calling stating she still has a headache and ringing in her ears. The infusion helped for less than a day and then headache was back. She says a friend takes Relpax for her headaches and it helps. Could she try this medication since ttopiramate (TOPAMAX) 50 MG tablet, divalproex (DEPAKOTE) 250 MG DR tablet and  zolmitriptan (ZOMIG-ZMT) 5 MG disintegrating tablet does not help. Please call and discuss.

## 2016-08-31 NOTE — Addendum Note (Signed)
Addended byAndrey Spearman on: 08/31/2016 04:41 PM   Modules accepted: Orders

## 2016-08-31 NOTE — Telephone Encounter (Signed)
Spoke with patient and advised her that Replax Rx has been sent, Zomig has been discontinued. Patient asked if she should continue taking topamax and depakote at current doses. Advised her to continue both at current doses. She verbalized understanding.

## 2016-09-07 ENCOUNTER — Telehealth: Payer: Self-pay | Admitting: *Deleted

## 2016-09-07 ENCOUNTER — Other Ambulatory Visit (HOSPITAL_BASED_OUTPATIENT_CLINIC_OR_DEPARTMENT_OTHER): Payer: 59

## 2016-09-07 DIAGNOSIS — D510 Vitamin B12 deficiency anemia due to intrinsic factor deficiency: Secondary | ICD-10-CM

## 2016-09-07 DIAGNOSIS — B379 Candidiasis, unspecified: Secondary | ICD-10-CM

## 2016-09-07 DIAGNOSIS — D518 Other vitamin B12 deficiency anemias: Secondary | ICD-10-CM

## 2016-09-07 DIAGNOSIS — D509 Iron deficiency anemia, unspecified: Secondary | ICD-10-CM

## 2016-09-07 LAB — CBC & DIFF AND RETIC
BASO%: 0.1 % (ref 0.0–2.0)
Basophils Absolute: 0 10*3/uL (ref 0.0–0.1)
EOS ABS: 0.1 10*3/uL (ref 0.0–0.5)
EOS%: 2 % (ref 0.0–7.0)
HEMATOCRIT: 42.9 % (ref 34.8–46.6)
HGB: 14.7 g/dL (ref 11.6–15.9)
Immature Retic Fract: 2.1 % (ref 1.60–10.00)
LYMPH#: 2.2 10*3/uL (ref 0.9–3.3)
LYMPH%: 31 % (ref 14.0–49.7)
MCH: 33.2 pg (ref 25.1–34.0)
MCHC: 34.3 g/dL (ref 31.5–36.0)
MCV: 96.8 fL (ref 79.5–101.0)
MONO#: 0.6 10*3/uL (ref 0.1–0.9)
MONO%: 8.1 % (ref 0.0–14.0)
NEUT%: 58.8 % (ref 38.4–76.8)
NEUTROS ABS: 4.1 10*3/uL (ref 1.5–6.5)
PLATELETS: 249 10*3/uL (ref 145–400)
RBC: 4.43 10*6/uL (ref 3.70–5.45)
RDW: 12.1 % (ref 11.2–14.5)
RETIC %: 1.12 % (ref 0.70–2.10)
RETIC CT ABS: 49.62 10*3/uL (ref 33.70–90.70)
WBC: 7 10*3/uL (ref 3.9–10.3)

## 2016-09-07 LAB — IRON AND TIBC
%SAT: 33 % (ref 21–57)
IRON: 77 ug/dL (ref 41–142)
TIBC: 231 ug/dL — AB (ref 236–444)
UIBC: 154 ug/dL (ref 120–384)

## 2016-09-07 LAB — COMPREHENSIVE METABOLIC PANEL
ALBUMIN: 3.2 g/dL — AB (ref 3.5–5.0)
AST: 11 U/L (ref 5–34)
Alkaline Phosphatase: 54 U/L (ref 40–150)
Anion Gap: 8 mEq/L (ref 3–11)
BUN: 13.3 mg/dL (ref 7.0–26.0)
CO2: 22 meq/L (ref 22–29)
Calcium: 8.2 mg/dL — ABNORMAL LOW (ref 8.4–10.4)
Chloride: 109 mEq/L (ref 98–109)
Creatinine: 0.8 mg/dL (ref 0.6–1.1)
EGFR: >90 mL/min/{1.73_m2} (ref >90)
Glucose: 83 mg/dl (ref 70–140)
Potassium: 3.4 mEq/L — ABNORMAL LOW (ref 3.5–5.1)
SODIUM: 140 meq/L (ref 136–145)
TOTAL PROTEIN: 6.3 g/dL — AB (ref 6.4–8.3)
Total Bilirubin: 0.41 mg/dL (ref 0.20–1.20)

## 2016-09-07 LAB — FERRITIN: Ferritin: 407 ng/ml — ABNORMAL HIGH (ref 9–269)

## 2016-09-07 NOTE — Telephone Encounter (Signed)
-----   Message from Eliezer Bottom, NP sent at 09/07/2016  4:26 PM EDT ----- Regarding: Iron  Iron studies look great!. No infusion needed at this time. Thank you!  Sarah  ----- Message ----- From: Interface, Lab In Three Zero One Sent: 09/07/2016   1:42 PM To: Eliezer Bottom, NP

## 2016-09-08 ENCOUNTER — Ambulatory Visit: Payer: 59

## 2016-09-08 ENCOUNTER — Ambulatory Visit: Payer: 59 | Admitting: Family

## 2016-09-08 LAB — VITAMIN B12: VITAMIN B 12: 297 pg/mL (ref 211–946)

## 2016-09-09 ENCOUNTER — Ambulatory Visit: Payer: 59

## 2016-09-09 ENCOUNTER — Ambulatory Visit: Payer: 59 | Admitting: Family

## 2016-10-18 ENCOUNTER — Other Ambulatory Visit: Payer: Self-pay | Admitting: *Deleted

## 2016-10-18 ENCOUNTER — Other Ambulatory Visit: Payer: Self-pay | Admitting: Family

## 2016-10-18 DIAGNOSIS — D510 Vitamin B12 deficiency anemia due to intrinsic factor deficiency: Secondary | ICD-10-CM

## 2016-10-19 ENCOUNTER — Other Ambulatory Visit (HOSPITAL_BASED_OUTPATIENT_CLINIC_OR_DEPARTMENT_OTHER): Payer: 59

## 2016-10-19 ENCOUNTER — Telehealth: Payer: Self-pay | Admitting: *Deleted

## 2016-10-19 DIAGNOSIS — D510 Vitamin B12 deficiency anemia due to intrinsic factor deficiency: Secondary | ICD-10-CM

## 2016-10-19 DIAGNOSIS — D518 Other vitamin B12 deficiency anemias: Secondary | ICD-10-CM | POA: Diagnosis not present

## 2016-10-19 DIAGNOSIS — D509 Iron deficiency anemia, unspecified: Secondary | ICD-10-CM | POA: Diagnosis not present

## 2016-10-19 LAB — CBC WITH DIFFERENTIAL/PLATELET
BASO%: 0.6 % (ref 0.0–2.0)
BASOS ABS: 0 10*3/uL (ref 0.0–0.1)
EOS ABS: 0.1 10*3/uL (ref 0.0–0.5)
EOS%: 1.1 % (ref 0.0–7.0)
HEMATOCRIT: 45.5 % (ref 34.8–46.6)
HEMOGLOBIN: 14.9 g/dL (ref 11.6–15.9)
LYMPH#: 2.5 10*3/uL (ref 0.9–3.3)
LYMPH%: 32.4 % (ref 14.0–49.7)
MCH: 32.1 pg (ref 25.1–34.0)
MCHC: 32.8 g/dL (ref 31.5–36.0)
MCV: 98 fL (ref 79.5–101.0)
MONO#: 0.6 10*3/uL (ref 0.1–0.9)
MONO%: 7.3 % (ref 0.0–14.0)
NEUT#: 4.5 10*3/uL (ref 1.5–6.5)
NEUT%: 58.6 % (ref 38.4–76.8)
Platelets: 269 10*3/uL (ref 145–400)
RBC: 4.65 10*6/uL (ref 3.70–5.45)
RDW: 12 % (ref 11.2–14.5)
WBC: 7.7 10*3/uL (ref 3.9–10.3)

## 2016-10-19 LAB — IRON AND TIBC
%SAT: 25 % (ref 21–57)
Iron: 62 ug/dL (ref 41–142)
TIBC: 245 ug/dL (ref 236–444)
UIBC: 183 ug/dL (ref 120–384)

## 2016-10-19 LAB — FERRITIN: Ferritin: 477 ng/ml — ABNORMAL HIGH (ref 9–269)

## 2016-10-19 NOTE — Telephone Encounter (Addendum)
Patient aware of results  ----- Message from Eliezer Bottom, NP sent at 10/19/2016  3:44 PM EST ----- Regarding: Iron  Iron studies look good! No infusion needed at this time. Thank you!  Sarah  ----- Message ----- From: Interface, Lab In Three Zero One Sent: 10/19/2016   2:01 PM To: Eliezer Bottom, NP

## 2016-10-20 ENCOUNTER — Ambulatory Visit: Payer: 59 | Admitting: Family

## 2016-10-20 ENCOUNTER — Ambulatory Visit: Payer: 59

## 2016-10-21 ENCOUNTER — Ambulatory Visit (HOSPITAL_BASED_OUTPATIENT_CLINIC_OR_DEPARTMENT_OTHER): Payer: 59 | Admitting: Family

## 2016-10-21 VITALS — BP 115/74 | HR 71 | Temp 98.6°F | Resp 20

## 2016-10-21 DIAGNOSIS — D510 Vitamin B12 deficiency anemia due to intrinsic factor deficiency: Secondary | ICD-10-CM

## 2016-10-21 DIAGNOSIS — N950 Postmenopausal bleeding: Secondary | ICD-10-CM

## 2016-10-21 DIAGNOSIS — D500 Iron deficiency anemia secondary to blood loss (chronic): Secondary | ICD-10-CM | POA: Diagnosis not present

## 2016-10-21 DIAGNOSIS — D508 Other iron deficiency anemias: Secondary | ICD-10-CM

## 2016-10-21 NOTE — Progress Notes (Signed)
Hematology and Oncology Follow Up Visit  Megan Davila [id number] Jan 25, 1966 50 y.o. 10/21/2016   Principle Diagnosis:  Iron deficiency anemia secondary to malabsorption Pernicious anemia saner to gastric bypass Iron deficiency anemia secondary to past menometrorrhagia  Current Therapy:   IV iron as indicated - last received in August 2017 Vitamin B 12 1,000 mcg - self injects at home monthly as needed     Interim History:  [name] is here today for a follow-up. She is doing well but dealing with some stress at work with lots of lay offs. This contributes to her intermittent fatigue.  She received Feraheme in August. Her iron saturation is now 25% with a ferritin of 477 so no infusion needed.  Her cycles are regular and not heavy.  No fever, n/v, cough, rash, dizziness, SOB, chest pain, palpitations, abdominal pain or changes in bowel or bladder habits.  She continues to eat healthy and stay well hydrated. Her weight is stable.  No swelling, tenderness, numbness or tingling in her extremities at this time. No c/o joint aches or pain.   Medications:    Medication List       Accurate as of 10/21/16  4:33 PM. Always use your most recent med list.          ALPRAZolam 1 MG tablet Commonly known as:  XANAX Take 1 mg by mouth 2 (two) times daily as needed. Anxiety   cyanocobalamin 1000 MCG/ML injection Commonly known as:  (VITAMIN B-12) Monthly injection   DEXILANT 60 MG capsule Generic drug:  dexlansoprazole Take 60 mg by mouth daily.   divalproex 250 MG DR tablet Commonly known as:  DEPAKOTE Take 1 tablet (250 mg total) by mouth at bedtime.   eletriptan 40 MG tablet Commonly known as:  RELPAX Take 1 tablet (40 mg total) by mouth as needed for migraine or headache. May repeat in 2 hours if needed. Max 2 tabs per day or 8 tabs per month.   ergocalciferol 50000 units capsule Commonly known as:  VITAMIN D2 Take 50,000 Units by mouth daily. She is taking daily - pr  pt   fexofenadine 180 MG tablet Commonly known as:  ALLEGRA Take 180 mg by mouth daily as needed. allergies   lansoprazole 30 MG capsule Commonly known as:  PREVACID Take 30 mg by mouth daily as needed. Acid reflux   multivitamin with minerals tablet Take 1 tablet by mouth daily.   nystatin powder Commonly known as:  nystatin Apply topically 2 (two) times daily.   ranitidine 150 MG capsule Commonly known as:  ZANTAC   thiamine 50 MG tablet Commonly known as:  VITAMIN B-1 daily.   topiramate 50 MG tablet Commonly known as:  TOPAMAX take 1 tablet by mouth every morning and take 2 tablets by mouth every evening   venlafaxine XR 150 MG 24 hr capsule Commonly known as:  EFFEXOR-XR Take 150 mg by mouth daily.       Allergies:  Allergies  Allergen Reactions  . Naproxen Anaphylaxis  . Monistat [Miconazole] Other (See Comments)    "burning"   . Sulfa Antibiotics Hives  . Tioconazole Itching    Past Medical History, Surgical history, Social history, and Family History were reviewed and updated.  Review of Systems: All other 10 point review of systems is negative.   Physical Exam:  height is 5' 3.5" (1.613 m) (pended) and weight is 153 lb 1.9 oz (69.5 kg) (pended). Her oral temperature is 98.6 F (37 C). Her  blood pressure is 115/74 and her pulse is 71. Her respiration is 20.   Wt Readings from Last 3 Encounters:  10/21/16 (P) 153 lb 1.9 oz (69.5 kg)  07/13/16 157 lb 6.4 oz (71.4 kg)  07/08/16 158 lb (71.7 kg)    Ocular: Sclerae unicteric, pupils equal, round and reactive to light Ear-nose-throat: Oropharynx clear, dentition fair Lymphatic: No cervical supraclavicular or axillary adenopathy Lungs no rales or rhonchi, good excursion bilaterally Heart regular rate and rhythm, no murmur appreciated Abd soft, nontender, positive bowel sounds, no liver or spleen tip palpated on exam MSK no focal spinal tenderness, no joint edema Neuro: non-focal, well-oriented,  appropriate affect Breasts: Deferred  Lab Results  Component Value Date   WBC 7.7 10/19/2016   HGB 14.9 10/19/2016   HCT 45.5 10/19/2016   MCV 98.0 10/19/2016   PLT 269 10/19/2016   Lab Results  Component Value Date   FERRITIN 477 (H) 10/19/2016   IRON 62 10/19/2016   TIBC 245 10/19/2016   UIBC 183 10/19/2016   IRONPCTSAT 25 10/19/2016   Lab Results  Component Value Date   RETICCTPCT 1.12 09/07/2016   RBC 4.65 10/19/2016   RETICCTABS 49.62 09/07/2016   No results found for: KPAFRELGTCHN, LAMBDASER, KAPLAMBRATIO No results found for: IGGSERUM, IGA, IGMSERUM No results found for: Kathrynn Ducking, MSPIKE, SPEI   Chemistry      Component Value Date/Time   NA 140 09/07/2016 1327   K 3.4 (L) 09/07/2016 1327   CL 103 09/03/2015 2050   CO2 22 09/07/2016 1327   BUN 13.3 09/07/2016 1327   CREATININE 0.8 09/07/2016 1327      Component Value Date/Time   CALCIUM 8.2 (L) 09/07/2016 1327   ALKPHOS 54 09/07/2016 1327   AST 11 09/07/2016 1327   ALT <9 09/07/2016 1327   BILITOT 0.41 09/07/2016 1327     Impression and Plan: [name] is 50 yo white female with iron deficiency anemia secondary to gastric bypass. She is doing well and asymptomatic at this time.  Her iron studies are stable so she will not need an infusion.  We will plan to see her back in 3 months for labs and follow-up.  She will contact our office with any questions or concerns. We can certainly see her sooner if need be.   Eliezer Bottom, NP 11/17/20174:33 PM

## 2016-10-24 ENCOUNTER — Ambulatory Visit: Payer: 59 | Admitting: Family

## 2016-11-14 ENCOUNTER — Other Ambulatory Visit: Payer: Self-pay | Admitting: Obstetrics and Gynecology

## 2016-11-14 DIAGNOSIS — Z1231 Encounter for screening mammogram for malignant neoplasm of breast: Secondary | ICD-10-CM

## 2016-12-14 ENCOUNTER — Telehealth: Payer: Self-pay | Admitting: Diagnostic Neuroimaging

## 2016-12-14 NOTE — Telephone Encounter (Signed)
Have pt do 1 month headache log, then come in for visit. -VRP

## 2016-12-14 NOTE — Telephone Encounter (Signed)
Spoke with patient for more details. She stated that she has been on topamax "for so long". She stated she had discussed with Dr Leta Baptist that it didn't seem to be working as well. She stated she is taking her migraine medications as prescribed. She stated she is now averaging "about 3 migraines a month" . She states she takes Relpax for rescue medication with sporadic relief. She read an article about Zonisamide and would like to try it for her migraines. Otherwise she asked if Topamax dose can be increased. Advised will discuss with Dr Leta Baptist and call her back.

## 2016-12-14 NOTE — Telephone Encounter (Signed)
Patient is calling to discuss changing medications from topiramate (TOPAMAX) 50 MG tablet to Zonisamide.

## 2016-12-14 NOTE — Telephone Encounter (Signed)
Per Dr Leta Baptist, spoke with patient and advised she keep a headache journal for one month then come in for follow up to discuss. Advised she keep details such as sleep, stress levels. Scheduled a follow up at first available in late afternoon at her request.  Advised she will get reminder call. She verbalized understanding, appreciation.

## 2016-12-15 ENCOUNTER — Other Ambulatory Visit: Payer: Self-pay | Admitting: *Deleted

## 2016-12-15 ENCOUNTER — Ambulatory Visit (HOSPITAL_BASED_OUTPATIENT_CLINIC_OR_DEPARTMENT_OTHER): Payer: 59

## 2016-12-15 ENCOUNTER — Telehealth: Payer: Self-pay | Admitting: *Deleted

## 2016-12-15 DIAGNOSIS — D510 Vitamin B12 deficiency anemia due to intrinsic factor deficiency: Secondary | ICD-10-CM

## 2016-12-15 DIAGNOSIS — D500 Iron deficiency anemia secondary to blood loss (chronic): Secondary | ICD-10-CM | POA: Diagnosis not present

## 2016-12-15 DIAGNOSIS — N950 Postmenopausal bleeding: Secondary | ICD-10-CM | POA: Diagnosis not present

## 2016-12-15 DIAGNOSIS — J029 Acute pharyngitis, unspecified: Secondary | ICD-10-CM | POA: Diagnosis not present

## 2016-12-15 DIAGNOSIS — R6889 Other general symptoms and signs: Secondary | ICD-10-CM | POA: Diagnosis not present

## 2016-12-15 LAB — CBC WITH DIFFERENTIAL/PLATELET
BASO%: 0.5 % (ref 0.0–2.0)
Basophils Absolute: 0 10*3/uL (ref 0.0–0.1)
EOS ABS: 0.1 10*3/uL (ref 0.0–0.5)
EOS%: 1.1 % (ref 0.0–7.0)
HEMATOCRIT: 45.6 % (ref 34.8–46.6)
HEMOGLOBIN: 15.6 g/dL (ref 11.6–15.9)
LYMPH#: 1.6 10*3/uL (ref 0.9–3.3)
LYMPH%: 24.2 % (ref 14.0–49.7)
MCH: 33.5 pg (ref 25.1–34.0)
MCHC: 34.1 g/dL (ref 31.5–36.0)
MCV: 98 fL (ref 79.5–101.0)
MONO#: 0.3 10*3/uL (ref 0.1–0.9)
MONO%: 5.2 % (ref 0.0–14.0)
NEUT#: 4.5 10*3/uL (ref 1.5–6.5)
NEUT%: 69 % (ref 38.4–76.8)
Platelets: 313 10*3/uL (ref 145–400)
RBC: 4.65 10*6/uL (ref 3.70–5.45)
RDW: 12.1 % (ref 11.2–14.5)
WBC: 6.5 10*3/uL (ref 3.9–10.3)

## 2016-12-15 LAB — FERRITIN: Ferritin: 465 ng/ml — ABNORMAL HIGH (ref 9–269)

## 2016-12-15 LAB — IRON AND TIBC
%SAT: 58 % — ABNORMAL HIGH (ref 21–57)
Iron: 142 ug/dL (ref 41–142)
TIBC: 246 ug/dL (ref 236–444)
UIBC: 104 ug/dL — ABNORMAL LOW (ref 120–384)

## 2016-12-15 NOTE — Telephone Encounter (Signed)
Patient is feeling fatigued and weak. She feels like she may need an iron infusion. She would like to have her labs drawn at [facility] to determine if her levels are low.  Spoke to Laverna Peace NP and she gives the verbal okay for labs today for patient. Patient appointment made, she is aware.

## 2016-12-16 ENCOUNTER — Telehealth: Payer: Self-pay | Admitting: *Deleted

## 2016-12-16 LAB — VITAMIN B12: Vitamin B12: 253 pg/mL (ref 232–1245)

## 2016-12-16 NOTE — Telephone Encounter (Addendum)
Patient aware of results.   ----- Message from Eliezer Bottom, NP sent at 12/16/2016  1:15 PM EST ----- Regarding: labs Iron and B 12 look great! No infusion needed. Thank you!  Sarah  ----- Message ----- From: Interface, Lab In Three Zero One Sent: 12/15/2016  10:12 AM To: Eliezer Bottom, NP

## 2016-12-19 ENCOUNTER — Ambulatory Visit: Payer: 59

## 2016-12-21 ENCOUNTER — Ambulatory Visit: Payer: 59 | Admitting: Obstetrics and Gynecology

## 2016-12-29 ENCOUNTER — Ambulatory Visit (INDEPENDENT_AMBULATORY_CARE_PROVIDER_SITE_OTHER): Payer: 59 | Admitting: Obstetrics and Gynecology

## 2016-12-29 ENCOUNTER — Encounter: Payer: Self-pay | Admitting: Obstetrics and Gynecology

## 2016-12-29 VITALS — BP 100/70 | HR 66 | Resp 18 | Ht 63.0 in | Wt 159.4 lb

## 2016-12-29 DIAGNOSIS — Z113 Encounter for screening for infections with a predominantly sexual mode of transmission: Secondary | ICD-10-CM

## 2016-12-29 DIAGNOSIS — Z01419 Encounter for gynecological examination (general) (routine) without abnormal findings: Secondary | ICD-10-CM

## 2016-12-29 LAB — COMPREHENSIVE METABOLIC PANEL
ALK PHOS: 48 U/L (ref 33–115)
ALT: 12 U/L (ref 6–29)
AST: 15 U/L (ref 10–35)
Albumin: 4 g/dL (ref 3.6–5.1)
BILIRUBIN TOTAL: 0.3 mg/dL (ref 0.2–1.2)
BUN: 12 mg/dL (ref 7–25)
CO2: 22 mmol/L (ref 20–31)
CREATININE: 0.7 mg/dL (ref 0.50–1.10)
Calcium: 8.6 mg/dL (ref 8.6–10.2)
Chloride: 105 mmol/L (ref 98–110)
Glucose, Bld: 78 mg/dL (ref 65–99)
POTASSIUM: 4 mmol/L (ref 3.5–5.3)
Sodium: 138 mmol/L (ref 135–146)
TOTAL PROTEIN: 6.5 g/dL (ref 6.1–8.1)

## 2016-12-29 LAB — LIPID PANEL
Cholesterol: 177 mg/dL (ref <200)
HDL: 58 mg/dL (ref >50)
LDL CALC: 106 mg/dL — AB (ref <100)
TRIGLYCERIDES: 64 mg/dL (ref <150)
Total CHOL/HDL Ratio: 3.1 Ratio (ref <5.0)
VLDL: 13 mg/dL (ref <30)

## 2016-12-29 LAB — TSH: TSH: 2.73 mIU/L

## 2016-12-29 NOTE — Patient Instructions (Signed)

## 2016-12-29 NOTE — Progress Notes (Signed)
51 y.o. G1P1 Divorced Caucasian female here for annual exam.    Wants routine labs and STD testing.   Menses - occasionally skips.  Had 10 menses in the last 12 months.  Menses last 3 - 4 days and not heavy. No significant cramps.   Having low ferritin levels and doing infusions.   Occasional leak of urine with sneeze.  Can not make it to bathroom on time when she wakes up at night.   PCP:   Carol Ada, MD  Patient's last menstrual period was 12/28/2016 (exact date).     Period Pattern: (!) Irregular Menstrual Flow:  (light to heavy--different each month) Menstrual Control: Tampon Dysmenorrhea: None     Sexually active: Yes.   FEMALE The current method of family planning is tubal ligation.    Exercising: No.   Smoker:  no  Health Maintenance: Pap:  11-20-15 Neg:Neg HR HPV History of abnormal Pap:  Yes, 1993 Hx conization of cervix--CIN 3, 10-07-13 pap Ascus:Pos HR HPV;LEEP procedure--LGSIL.05-14-14 pap Neg:Pos HR HPV with repeat pap 10-09-14 Neg:Neg HR HPV. MMG: 09-21-15 3D Density B/Neg/BiRads1:TBC--appt. 01-23-17 Colonoscopy:  03/2015 normal with Dr. Stacie Glaze GI.  BMD:  2015  Result:normal with Dr.Balin TDaP:  10-17-13 Gardasil:   N/A HIV:  today Hep C:  today Screening Labs:   Urine today: unable to void   reports that she has never smoked. She has never used smokeless tobacco. She reports that she drinks alcohol. She reports that she does not use drugs.  Past Medical History:  Diagnosis Date  . Abnormal Pap smear   . Anemia   . Anxiety   . Broken toe 12-22-15   middle toe right foot  . Cataract of both eyes   . Depression   . GERD (gastroesophageal reflux disease)   . Headache(784.0)   . Heart murmur   . History of kidney stones   . 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 Perforated bowel ([place])   . Pernicious anemia 06/19/2015  . PONV (postoperative nausea and vomiting)     Past Surgical History:  Procedure Laterality Date  .  CATARACT EXTRACTION, BILATERAL Bilateral 02/2016  . CERVICAL BIOPSY  W/ LOOP ELECTRODE EXCISION  11/2013   LGSIL  . 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 CESAREAN SECTION  2008  . CHOLECYSTECTOMY    . GASTRIC BYPASS  2002  . HYSTEROSCOPY W/D&C  10/14/2011   Procedure: DILATATION AND CURETTAGE (D&C) /HYSTEROSCOPY;  Surgeon: Arloa Koh;  Location: [facility];  Service: Gynecology;  Laterality: Bilateral;  . LASIK    . NOSE SURGERY    . STOMACH SURGERY  04/20/2015   dilation of "connection between stomach and intestine", [facility]  . TONSILLECTOMY    . TONSILLECTOMY    . TUBAL LIGATION  2008    Current Outpatient Prescriptions  Medication Sig Dispense Refill  . ALPRAZolam (XANAX) 1 MG tablet Take 1 mg by mouth 2 (two) times daily as needed. Anxiety      . cyanocobalamin (,VITAMIN B-12,) 1000 MCG/ML injection Monthly injection  0  . DEXILANT 60 MG capsule Take 60 mg by mouth daily.    . divalproex (DEPAKOTE) 250 MG DR tablet Take 1 tablet (250 mg total) by mouth at bedtime. 90 tablet 4  . eletriptan (RELPAX) 40 MG tablet Take 1 tablet (40 mg total) by mouth as needed for migraine or headache. May repeat in 2 hours if needed. Max 2 tabs per day or  8 tabs per month. 10 tablet 12  . ergocalciferol (VITAMIN D2) 50000 UNITS capsule Take 50,000 Units by mouth daily. She is taking daily - pr pt    . fexofenadine (ALLEGRA) 180 MG tablet Take 180 mg by mouth daily as needed. allergies     . lansoprazole (PREVACID) 30 MG capsule Take 30 mg by mouth daily as needed. Acid reflux      . Multiple Vitamins-Minerals (MULTIVITAMIN WITH MINERALS) tablet Take 1 tablet by mouth daily.      Marland Kitchen nystatin (NYSTATIN) powder Apply topically 2 (two) times daily. 45 g 1  . Polyethyl Glycol-Propyl Glycol 0.4-0.3 % SOLN Place 1 drop into both eyes as needed.    . ranitidine (ZANTAC) 150 MG capsule     . thiamine (VITAMIN B-1) 50 MG tablet daily.  0  . topiramate (TOPAMAX) 50 MG tablet  take 1 tablet by mouth every morning and take 2 tablets by mouth every evening 270 tablet 4  . venlafaxine XR (EFFEXOR-XR) 75 MG 24 hr capsule Take 225 mg by mouth every morning.  0   No current facility-administered medications for this visit.     Family History  Problem Relation Age of Onset  . Diabetes Mother   . Heart disease Father   . Social phobia Father   . Psychiatric Illness Father   . Hypertension Father   . Stroke Father   . Dementia Father   . Heart disease    . Diabetes    . Stroke    . Hypertension Maternal Grandmother   . Thyroid disease Maternal Grandmother   . Hypertension Paternal Grandmother   . Stroke Paternal Grandfather     ROS:  Pertinent items are noted in HPI.  Otherwise, a comprehensive ROS was negative.  Exam:   BP 100/70 (BP Location: Right Arm, Patient Position: Sitting, Cuff Size: Normal)   Pulse 66   Resp 18   Ht 5\' 3"  (1.6 m)   Wt 159 lb 6.4 oz (72.3 kg)   LMP 12/28/2016 (Exact Date)   BMI 28.24 kg/m     General appearance: alert, cooperative and appears stated age Head: Normocephalic, without obvious abnormality, atraumatic Neck: no adenopathy, supple, symmetrical, trachea midline and thyroid normal to inspection and palpation Lungs: clear to auscultation bilaterally Breasts: normal appearance, no masses or tenderness, No nipple retraction or dimpling, No nipple discharge or bleeding, No axillary or supraclavicular adenopathy Heart: regular rate and rhythm Abdomen: soft, non-tender; no masses, no organomegaly Extremities: extremities normal, atraumatic, no cyanosis or edema Skin: Skin color, texture, turgor normal. No rashes or lesions Lymph nodes: Cervical, supraclavicular, and axillary nodes normal. No abnormal inguinal nodes palpated Neurologic: Grossly normal  Pelvic: External genitalia:  no lesions              Urethra:  normal appearing urethra with no masses, tenderness or lesions              Bartholins and Skenes: normal                  Vagina: normal appearing vagina with normal color and discharge, no lesions              Cervix: no lesions              Pap taken: No.  Menstrual flow prevents pap today. Bimanual Exam:  Uterus:  normal size, contour, position, consistency, mobility, non-tender              Adnexa:  no mass, fullness, tenderness              Rectal exam: Yes.  .  Confirms.              Anus:  normal sphincter tone, no lesions  Chaperone was present for exam.  Assessment:   Well woman visit with normal exam. Hx of conization of cervix and LEEP. Hx LGSIL. Hx pernicious anemia. Hx molluscum treated.  Mild GSI.   Plan: Mammogram screening discussed. Recommended self breast awareness. Pap and HR HPV as above. Guidelines for Calcium, Vitamin D, regular exercise program including cardiovascular and weight bearing exercise. Discussion of weight loss through exercise and good nutrition.  Return for pap and GC/CT.  Routine labs and serum STD testing today.  Discussed tx for GSI.if needed - physical therapy and surgical care.  Bladder irritants discussed. Follow up annually and prn.      After visit summary provided.

## 2016-12-30 LAB — STD PANEL
HEP B S AG: NEGATIVE
HIV 1&2 Ab, 4th Generation: NONREACTIVE

## 2016-12-30 LAB — HEPATITIS C ANTIBODY: HCV AB: NEGATIVE

## 2016-12-30 LAB — VITAMIN D 25 HYDROXY (VIT D DEFICIENCY, FRACTURES): VIT D 25 HYDROXY: 24 ng/mL — AB (ref 30–100)

## 2017-01-16 ENCOUNTER — Ambulatory Visit: Payer: 59 | Admitting: Obstetrics and Gynecology

## 2017-01-17 DIAGNOSIS — B349 Viral infection, unspecified: Secondary | ICD-10-CM | POA: Diagnosis not present

## 2017-01-17 DIAGNOSIS — K143 Hypertrophy of tongue papillae: Secondary | ICD-10-CM | POA: Diagnosis not present

## 2017-01-23 ENCOUNTER — Inpatient Hospital Stay: Admission: RE | Admit: 2017-01-23 | Payer: 59 | Source: Ambulatory Visit

## 2017-01-24 ENCOUNTER — Ambulatory Visit: Payer: Self-pay | Admitting: Diagnostic Neuroimaging

## 2017-01-25 ENCOUNTER — Other Ambulatory Visit: Payer: 59

## 2017-01-26 ENCOUNTER — Ambulatory Visit: Payer: 59 | Admitting: Obstetrics and Gynecology

## 2017-01-26 ENCOUNTER — Telehealth: Payer: Self-pay | Admitting: Hematology & Oncology

## 2017-01-26 NOTE — Telephone Encounter (Signed)
Patient called and cx 01/27/17 apt and resch for 03/10/17

## 2017-01-27 ENCOUNTER — Ambulatory Visit: Payer: 59

## 2017-01-27 ENCOUNTER — Ambulatory Visit: Payer: 59 | Admitting: Hematology & Oncology

## 2017-02-17 ENCOUNTER — Encounter: Payer: Self-pay | Admitting: Obstetrics and Gynecology

## 2017-02-17 ENCOUNTER — Ambulatory Visit (INDEPENDENT_AMBULATORY_CARE_PROVIDER_SITE_OTHER): Payer: 59 | Admitting: Obstetrics and Gynecology

## 2017-02-17 VITALS — BP 110/74 | HR 76 | Ht 63.0 in | Wt 164.0 lb

## 2017-02-17 DIAGNOSIS — R8290 Unspecified abnormal findings in urine: Secondary | ICD-10-CM | POA: Diagnosis not present

## 2017-02-17 DIAGNOSIS — Z124 Encounter for screening for malignant neoplasm of cervix: Secondary | ICD-10-CM

## 2017-02-17 DIAGNOSIS — R102 Pelvic and perineal pain: Secondary | ICD-10-CM | POA: Diagnosis not present

## 2017-02-17 DIAGNOSIS — Z113 Encounter for screening for infections with a predominantly sexual mode of transmission: Secondary | ICD-10-CM

## 2017-02-17 LAB — POCT URINALYSIS DIPSTICK
BILIRUBIN UA: NEGATIVE
GLUCOSE UA: NEGATIVE
KETONES UA: NEGATIVE
Leukocytes, UA: NEGATIVE
NITRITE UA: NEGATIVE
PH UA: 5 (ref 5.0–8.0)
Protein, UA: NEGATIVE
RBC UA: NEGATIVE
UROBILINOGEN UA: NEGATIVE (ref ?–2.0)

## 2017-02-17 NOTE — Progress Notes (Signed)
GYNECOLOGY  VISIT   HPI: 51 y.o.   Divorced  Caucasian  female   G1P1 with Patient's last menstrual period was 01/21/2017 (approximate).   here today complaining of dark urine, low back discomfort, lower pelvic discomfort for 3 days. Patient denies urinary frequency or fever. She also complains of ankle swelling. States her urine is dark.   She is due for re-pap.   She had bleeding at time of her annual exam on 12/29/16 so pap and GC/CT could not be done.   Urine EXB:[id number]  GYNECOLOGIC HISTORY: Patient's last menstrual period was 01/21/2017 (approximate). Contraception:  Tubal Menopausal hormone therapy:  n/a Last mammogram: 09-21-15 3D Density B/Neg/BiRads1:TBC   Last pap smear: 11-20-15 Neg:Neg HR HPV;10-09-14 Neg:Neg HR HPV          OB History    Gravida Para Term Preterm AB Living   1 1       1    SAB TAB Ectopic Multiple Live Births         1           Patient Active Problem List   Diagnosis Date Noted  . Anxiety 06/19/2015  . Anemia, iron deficiency 06/19/2015  . Pernicious anemia 06/19/2015  . 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 Protein-calorie malnutrition, severe ([place]) 04/18/2015  . Severe protein-calorie malnutrition ([place]) 04/18/2015  . 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 Bariatric surgery status 04/18/2015  . 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 GERD (gastroesophageal reflux disease) 04/17/2015  . Hypokalemia 04/16/2015  . Migraine with aura 05/21/2014  . 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 Abnormal Pap smear   . Anemia   . Anxiety   . Broken toe 12-22-15   middle toe right foot  . Cataract of both eyes   . Depression   . GERD (gastroesophageal reflux disease)   . Headache(784.0)   . Heart murmur   . History of kidney stones   . 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 Perforated bowel ([place])   .  Pernicious anemia 06/19/2015  . PONV (postoperative nausea and vomiting)     Past Surgical History:  Procedure Laterality Date  . CATARACT EXTRACTION, BILATERAL Bilateral 02/2016  . CERVICAL BIOPSY  W/ LOOP ELECTRODE EXCISION  11/2013   LGSIL  . 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 CESAREAN SECTION  2008  . CHOLECYSTECTOMY    . GASTRIC BYPASS  2002  . HYSTEROSCOPY W/D&C  10/14/2011   Procedure: DILATATION AND CURETTAGE (D&C) /HYSTEROSCOPY;  Surgeon: Arloa Koh;  Location: [facility];  Service: Gynecology;  Laterality: Bilateral;  . LASIK    . NOSE SURGERY    . STOMACH SURGERY  04/20/2015   dilation of "connection between stomach and intestine", [facility]  . TONSILLECTOMY    . TONSILLECTOMY    . TUBAL LIGATION  2008    Current Outpatient Prescriptions  Medication Sig Dispense Refill  . ALPRAZolam (XANAX) 1 MG tablet Take 1 mg by mouth 2 (two) times daily as needed. Anxiety      . cyanocobalamin (,VITAMIN B-12,) 1000 MCG/ML injection Monthly injection  0  . DEXILANT 60 MG capsule Take 60 mg by mouth daily.    . divalproex (DEPAKOTE) 250 MG DR tablet Take 1 tablet (250 mg total) by mouth at bedtime. 90 tablet 4  . eletriptan (RELPAX) 40 MG tablet Take 1 tablet (40 mg total) by  mouth as needed for migraine or headache. May repeat in 2 hours if needed. Max 2 tabs per day or 8 tabs per month. 10 tablet 12  . ergocalciferol (VITAMIN D2) 50000 UNITS capsule Take 50,000 Units by mouth daily. She is taking daily - pr pt    . fexofenadine (ALLEGRA) 180 MG tablet Take 180 mg by mouth daily as needed. allergies     . fluticasone (FLONASE) 50 MCG/ACT nasal spray Place 1 spray into both nostrils as needed.  1  . lansoprazole (PREVACID) 30 MG capsule Take 30 mg by mouth daily as needed. Acid reflux      . Multiple Vitamins-Minerals (MULTIVITAMIN WITH MINERALS) tablet Take 1 tablet by mouth daily.      Marland Kitchen nystatin (NYSTATIN) powder Apply topically 2 (two) times  daily. 45 g 1  . Polyethyl Glycol-Propyl Glycol 0.4-0.3 % SOLN Place 1 drop into both eyes as needed.    . ranitidine (ZANTAC) 150 MG capsule     . thiamine (VITAMIN B-1) 50 MG tablet daily.  0  . topiramate (TOPAMAX) 50 MG tablet take 1 tablet by mouth every morning and take 2 tablets by mouth every evening 270 tablet 4  . venlafaxine XR (EFFEXOR-XR) 75 MG 24 hr capsule Take 225 mg by mouth every morning.  0   No current facility-administered medications for this visit.      ALLERGIES: Naproxen; Monistat [miconazole]; Sulfa antibiotics; and Tioconazole  Family History  Problem Relation Age of Onset  . Diabetes Mother   . Heart disease Father   . Social phobia Father   . Psychiatric Illness Father   . Hypertension Father   . Stroke Father   . Dementia Father   . Heart disease    . Diabetes    . Stroke    . Hypertension Maternal Grandmother   . Thyroid disease Maternal Grandmother   . Hypertension Paternal Grandmother   . Stroke Paternal Grandfather     Social History   Social History  . Marital status: Divorced    Spouse name: N/A  . Number of children: 1  . Years of education: College   Occupational History  .  [employer]   Social History Main Topics  . Smoking status: Never Smoker  . Smokeless tobacco: Never Used  . Alcohol use 0.0 oz/week     Comment: Once a month-rarely  . Drug use: No  . Sexual activity: Yes    Partners: Male    Birth control/ protection: Surgical     Comment: tubal   Other Topics Concern  . Not on file   Social History Narrative   Pt lives at home with her family.   Caffeine Use: once daily    ROS:  Pertinent items are noted in HPI.  PHYSICAL EXAMINATION:    BP 110/74 (BP Location: Right Arm, Patient Position: Sitting, Cuff Size: Normal)   Pulse 76   Ht 5\' 3"  (1.6 m)   Wt 164 lb (74.4 kg)   LMP 01/21/2017 (Approximate)   BMI 29.05 kg/m     General appearance: alert, cooperative and appears stated age   Pelvic:  External genitalia:  no lesions              Urethra:  normal appearing urethra with no masses, tenderness or lesions              Bartholins and Skenes: normal  Vagina: normal appearing vagina with normal color and discharge, no lesions              Cervix: no lesions                Bimanual Exam:  Uterus:  normal size, contour, position, consistency, mobility, non-tender              Adnexa: no mass, fullness, tenderness        Chaperone was present for exam.  ASSESSMENT  Hx LGSIL.  Due for pap today.  STD screening.  Change in color of urine.  Normal urine dip.   PLAN  Pap and HR HPV.  GC/CT from [facility]. Increase hydration and return if urine remains darkly colored. Follow up for annual exam in one year if pap is normal.  An After Visit Summary was printed and given to the patient.  _15_____ minutes face to face time of which over 50% was spent in counseling.

## 2017-02-20 LAB — IPS N GONORRHOEA AND CHLAMYDIA BY PCR

## 2017-02-21 LAB — IPS PAP TEST WITH HPV

## 2017-02-27 ENCOUNTER — Ambulatory Visit: Payer: 59 | Admitting: Obstetrics and Gynecology

## 2017-03-08 ENCOUNTER — Other Ambulatory Visit (HOSPITAL_BASED_OUTPATIENT_CLINIC_OR_DEPARTMENT_OTHER): Payer: 59

## 2017-03-08 DIAGNOSIS — D510 Vitamin B12 deficiency anemia due to intrinsic factor deficiency: Secondary | ICD-10-CM | POA: Diagnosis not present

## 2017-03-08 DIAGNOSIS — D508 Other iron deficiency anemias: Secondary | ICD-10-CM | POA: Diagnosis not present

## 2017-03-08 DIAGNOSIS — N950 Postmenopausal bleeding: Secondary | ICD-10-CM | POA: Diagnosis not present

## 2017-03-08 DIAGNOSIS — D500 Iron deficiency anemia secondary to blood loss (chronic): Secondary | ICD-10-CM | POA: Diagnosis not present

## 2017-03-08 LAB — CBC WITH DIFFERENTIAL/PLATELET
BASO%: 0 % (ref 0.0–2.0)
Basophils Absolute: 0 10*3/uL (ref 0.0–0.1)
EOS%: 0.2 % (ref 0.0–7.0)
Eosinophils Absolute: 0 10*3/uL (ref 0.0–0.5)
HCT: 43.4 % (ref 34.8–46.6)
HGB: 14.3 g/dL (ref 11.6–15.9)
LYMPH%: 36.9 % (ref 14.0–49.7)
MCH: 32 pg (ref 25.1–34.0)
MCHC: 32.9 g/dL (ref 31.5–36.0)
MCV: 97.1 fL (ref 79.5–101.0)
MONO#: 0.4 10*3/uL (ref 0.1–0.9)
MONO%: 7.9 % (ref 0.0–14.0)
NEUT#: 3.1 10*3/uL (ref 1.5–6.5)
NEUT%: 55 % (ref 38.4–76.8)
PLATELETS: 246 10*3/uL (ref 145–400)
RBC: 4.47 10*6/uL (ref 3.70–5.45)
RDW: 12.1 % (ref 11.2–14.5)
WBC: 5.6 10*3/uL (ref 3.9–10.3)
lymph#: 2.1 10*3/uL (ref 0.9–3.3)

## 2017-03-08 LAB — FERRITIN: FERRITIN: 373 ng/mL — AB (ref 9–269)

## 2017-03-08 LAB — IRON AND TIBC
%SAT: 30 % (ref 21–57)
IRON: 73 ug/dL (ref 41–142)
TIBC: 243 ug/dL (ref 236–444)
UIBC: 170 ug/dL (ref 120–384)

## 2017-03-09 ENCOUNTER — Telehealth: Payer: Self-pay | Admitting: *Deleted

## 2017-03-09 LAB — VITAMIN B12: VITAMIN B 12: 284 pg/mL (ref 232–1245)

## 2017-03-09 NOTE — Telephone Encounter (Signed)
-----   Message from Eliezer Bottom, NP sent at 03/09/2017  9:57 AM EDT ----- Regarding: Vit B  Iron studies are stable. Is she self injection her B 12 or receiving in the office? She is ok to get this.   Sarah  ----- Message ----- From: Interface, Lab In Three Zero One Sent: 03/08/2017  12:49 PM To: Eliezer Bottom, NP

## 2017-03-10 ENCOUNTER — Ambulatory Visit: Payer: 59

## 2017-03-10 ENCOUNTER — Telehealth: Payer: Self-pay | Admitting: Hematology & Oncology

## 2017-03-10 ENCOUNTER — Ambulatory Visit: Payer: 59 | Admitting: Hematology & Oncology

## 2017-03-10 NOTE — Telephone Encounter (Signed)
Per order to call and cx 03/10/17 apt and resch.  Patient resch apt for 04/14/17

## 2017-03-25 DIAGNOSIS — J3089 Other allergic rhinitis: Secondary | ICD-10-CM | POA: Diagnosis not present

## 2017-04-14 ENCOUNTER — Ambulatory Visit: Payer: 59 | Admitting: Hematology & Oncology

## 2017-04-25 ENCOUNTER — Ambulatory Visit (INDEPENDENT_AMBULATORY_CARE_PROVIDER_SITE_OTHER): Payer: 59 | Admitting: Diagnostic Neuroimaging

## 2017-04-25 ENCOUNTER — Encounter: Payer: Self-pay | Admitting: Diagnostic Neuroimaging

## 2017-04-25 VITALS — BP 121/82 | HR 77 | Wt 167.0 lb

## 2017-04-25 DIAGNOSIS — G43109 Migraine with aura, not intractable, without status migrainosus: Secondary | ICD-10-CM

## 2017-04-25 DIAGNOSIS — E559 Vitamin D deficiency, unspecified: Secondary | ICD-10-CM | POA: Diagnosis not present

## 2017-04-25 DIAGNOSIS — Z9884 Bariatric surgery status: Secondary | ICD-10-CM | POA: Diagnosis not present

## 2017-04-25 MED ORDER — TOPIRAMATE 50 MG PO TABS: 100.0000 mg | ORAL_TABLET | Freq: Two times a day (BID) | ORAL | 4 refills | Status: DC

## 2017-04-25 MED ORDER — DIVALPROEX SODIUM 250 MG PO DR TAB: 250.0000 mg | DELAYED_RELEASE_TABLET | Freq: Two times a day (BID) | ORAL | 4 refills | Status: DC

## 2017-04-25 MED ORDER — ELETRIPTAN HYDROBROMIDE 40 MG PO TABS: 40.0000 mg | ORAL_TABLET | ORAL | 12 refills | Status: DC | PRN

## 2017-04-25 NOTE — Patient Instructions (Addendum)
Thank you for coming to see [place] at [facility]. I hope we have been able to provide you high quality care today.  You may receive a patient satisfaction survey over the next few weeks. We would appreciate your feedback and comments so that we may continue to improve ourselves and the health of our patients.  - increase topiramate to 147m twice a day; may consider extended release TPX in future or zonisamide - increase divalproex to 2574mtwice a day - continue eletriptan as needed for migraine rescue - continue to improve nutrition, exercise, stress and sleep issues   ~~~~~~~~~~~~~~~~~~~~~~~~~~~~~~~~~~~~~~~~~~~~~~~~~~~~~~~~~~~~~~~~~  DR. Nolah Krenzer'S GUIDE TO HAPPY AND HEALTHY LIVING These are some of my general health and wellness recommendations. Some of them may apply to you better than others. Please use common sense as you try these suggestions and feel free to ask me any questions.   ACTIVITY/FITNESS Mental, social, emotional and physical stimulation are very important for brain and body health. Try learning a new activity (arts, music, language, sports, games).  Keep moving your body to the best of your abilities. You can do this at home, inside or outside, the park, community center, gym or anywhere you like. Consider a physical therapist or personal trainer to get started. Consider the app Sworkit. Fitness trackers such as smart-watches, smart-phones or Fitbits can help as well.   NUTRITION Eat more plants: colorful vegetables, nuts, seeds and berries.  Eat less sugar, salt, preservatives and processed foods.  Avoid toxins such as cigarettes and alcohol.  Drink water when you are thirsty. Warm water with a slice of lemon is an excellent morning drink to start the day.  Consider these websites for more information The Nutrition Source (hthttps://www.henry-hernandez.biz/Precision Nutrition  (wwWindowBlog.ch  RELAXATION Consider practicing mindfulness meditation or other relaxation techniques such as deep breathing, prayer, yoga, tai chi, massage. See website mindful.org or the apps Headspace or Calm to help get started.   SLEEP Try to get at least 7-8+ hours sleep per day. Regular exercise and reduced caffeine will help you sleep better. Practice good sleep hygeine techniques. See website sleep.org for more information.   PLANNING Prepare estate planning, living will, healthcare POA documents. Sometimes this is best planned with the help of an attorney. Theconversationproject.org and agingwithdignity.org are excellent resources.

## 2017-04-25 NOTE — Progress Notes (Signed)
[facility]  PATIENT: Megan Davila DOB: 1965-12-21  REFERRING CLINICIAN:  HISTORY FROM: patient  REASON FOR VISIT: follow up   HISTORICAL  CHIEF COMPLAINT:  Chief Complaint  Patient presents with  . Migraine    rm 6, "I don't feel Topamax is controlling migraines well any more; get migraine 3 x month, severe ringing in my ears x 3 days; Relpax has helped a lot""  . Follow-up    headache log, discuss meds    HISTORY OF PRESENT ILLNESS:   UPDATE 04/25/17: Since last visit, HA continue (2-3 attacks per month; each lasting 2-3 days). Has gained some weight gradually in last 2 years (100 --> 167 lbs).   UPDATE 07/13/16: Since last visit, only 1-2 major HA in last year needing ER evaluation. May have had total 6-8 migraine attacks all year. Overall doing well. Tolerating TPX, VPA and zolmitriptan. Still with low iron levels (managed by Dr Marin Olp).   UPDATE 05/20/15: Since last visit, only 6 HA in last 1 year. Had stricture dilation at [facility], complicated by perforation and repair in May 2016. Fortunately, overall doing well now.  UPDATE 05/21/14: Having migraine HAs 3 months out of per year. When she has migraine, it usually lasts up to 1 week at a time, and then she runs out of triptan. Using maxalt and zomig (alternating). Still on TPX and VPA for prevention. Still with stress related to work.  UPDATE 11/13/12 (JM):  She has been doing well without  any severe headaches until today she has a headache 6/10 with sharp pains behind her eyes. She has not taken any pain medications this morning.  She has had a 7 lb weight loss since last visit now 117lbs, has a history of poor absorption with oral medications.     UPDATE 09/20/12 (JM):  Returns to office since 2011, headaches in the last couple of months, has been persistent with headaches.  She had gastric bypass 2002, pregnant 6 years ago. Was 180 and is now 125lbs.  Has increased stress at work.  Tolerating Topamax well  but feels that when she takes she has a loss of appetite.  Helpful with headache.  Increase sensitivity to hearing, photosensitivity, tinnitus and nausea.  She feels the air filters at work are causing headaches.  She is unable to state how often has severe headaches.  Has pain behind both eyes.  Unable to take NSAID's secondary to anaphylaxis.  Seen PCP October 10 and prescribed Relapax but has not tried.    PRIOR HPI (04/01/10, VRP): 51 year old right-handed female with history of gastric bypass surgery presenting for evaluation of new onset severe left-sided headache on March 23, 2010. On March 23, 2010 patient developed sudden onset left-sided headache behind her left eye. This was associated with seeing spots, feeling nausea and photophobia/phonophobia.  Headache lasted for several days. On April 21 she was seen by her primary care physician who prescribed Maxalt (provided mild relief) and hydrocodone (no relief). On April 24 she was evaluated at [facility] emergency room. Since that time her headaches have resolved. She does report persistent mild neck pain and difficulty sleeping. She does report remote history of similar headaches 10 years ago. There is no family history of migraine headache. She denies numbness or weakness in arms or legs.   REVIEW OF SYSTEMS: Full 14 system review of systems performed and negative except: fatigue insomnia.    ALLERGIES: Allergies  Allergen Reactions  . Naproxen Anaphylaxis  . Monistat [Miconazole]  Other (See Comments)    "burning"   . Sulfa Antibiotics Hives  . Tioconazole Itching    HOME MEDICATIONS: Outpatient Medications Prior to Visit  Medication Sig Dispense Refill  . ALPRAZolam (XANAX) 1 MG tablet Take 1 mg by mouth 2 (two) times daily as needed. Anxiety      . cyanocobalamin (,VITAMIN B-12,) 1000 MCG/ML injection Monthly injection  0  . DEXILANT 60 MG capsule Take 60 mg by mouth daily.    . divalproex (DEPAKOTE) 250 MG DR tablet Take 1  tablet (250 mg total) by mouth at bedtime. 90 tablet 4  . eletriptan (RELPAX) 40 MG tablet Take 1 tablet (40 mg total) by mouth as needed for migraine or headache. May repeat in 2 hours if needed. Max 2 tabs per day or 8 tabs per month. 10 tablet 12  . ergocalciferol (VITAMIN D2) 50000 UNITS capsule Take 50,000 Units by mouth daily. She is taking daily - pr pt    . fexofenadine (ALLEGRA) 180 MG tablet Take 180 mg by mouth daily as needed. allergies     . fluticasone (FLONASE) 50 MCG/ACT nasal spray Place 1 spray into both nostrils as needed.  1  . lansoprazole (PREVACID) 30 MG capsule Take 30 mg by mouth daily as needed. Acid reflux      . Multiple Vitamins-Minerals (MULTIVITAMIN WITH MINERALS) tablet Take 1 tablet by mouth daily.      Marland Kitchen nystatin (NYSTATIN) powder Apply topically 2 (two) times daily. 45 g 1  . Polyethyl Glycol-Propyl Glycol 0.4-0.3 % SOLN Place 1 drop into both eyes as needed.    . ranitidine (ZANTAC) 150 MG capsule     . thiamine (VITAMIN B-1) 50 MG tablet daily.  0  . topiramate (TOPAMAX) 50 MG tablet take 1 tablet by mouth every morning and take 2 tablets by mouth every evening 270 tablet 4  . venlafaxine XR (EFFEXOR-XR) 75 MG 24 hr capsule Take 225 mg by mouth every morning.  0   No facility-administered medications prior to visit.     PAST MEDICAL HISTORY: Past Medical History:  Diagnosis Date  . Abnormal Pap smear   . Anemia   . Anxiety   . Broken toe 12-22-15   middle toe right foot  . Cataract of both eyes   . Depression   . GERD (gastroesophageal reflux disease)   . Headache(784.0)   . Heart murmur   . History of kidney stones   . 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 Perforated bowel ([place])   . Pernicious anemia 06/19/2015  . PONV (postoperative nausea and vomiting)     PAST SURGICAL HISTORY: Past Surgical History:  Procedure Laterality Date  . CATARACT EXTRACTION, BILATERAL Bilateral 02/2016  . CERVICAL BIOPSY  W/ LOOP ELECTRODE  EXCISION  11/2013   LGSIL  . 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 CESAREAN SECTION  2008  . CHOLECYSTECTOMY    . GASTRIC BYPASS  2002  . HYSTEROSCOPY W/D&C  10/14/2011   Procedure: DILATATION AND CURETTAGE (D&C) /HYSTEROSCOPY;  Surgeon: Arloa Koh;  Location: [facility];  Service: Gynecology;  Laterality: Bilateral;  . LASIK    . NOSE SURGERY    . STOMACH SURGERY  04/20/2015   dilation of "connection between stomach and intestine", [facility]  . TONSILLECTOMY    . TONSILLECTOMY    . TUBAL LIGATION  2008    FAMILY HISTORY: Family History  Problem Relation Age of Onset  .  Diabetes Mother   . Heart disease Father   . Social phobia Father   . Psychiatric Illness Father   . Hypertension Father   . Stroke Father   . Dementia Father   . Heart disease Unknown   . Diabetes Unknown   . Stroke Unknown   . Hypertension Maternal Grandmother   . Thyroid disease Maternal Grandmother   . Hypertension Paternal Grandmother   . Stroke Paternal Grandfather     SOCIAL HISTORY:  Social History   Social History  . Marital status: Divorced    Spouse name: N/A  . Number of children: 1  . Years of education: College   Occupational History  .  [employer]   Social History Main Topics  . Smoking status: Never Smoker  . Smokeless tobacco: Never Used  . Alcohol use 0.0 oz/week     Comment: Once a month-rarely  . Drug use: No  . Sexual activity: Yes    Partners: Male    Birth control/ protection: Surgical     Comment: tubal   Other Topics Concern  . Not on file   Social History Narrative   Pt lives at home with her family.   Caffeine Use: once daily     PHYSICAL EXAM  Vitals:   04/25/17 1338  BP: 121/82  Pulse: 77  Weight: 167 lb (75.8 kg)    Not recorded      Body mass index is 29.58 kg/m.  GENERAL EXAM: Patient is in no distress; well developed, nourished and groomed; neck is supple  CARDIOVASCULAR: Regular rate and  rhythm, no murmurs, no carotid bruits  NEUROLOGIC: MENTAL STATUS: awake, alert, language fluent, comprehension intact, naming intact, fund of knowledge appropriate CRANIAL NERVE:  pupils equal and reactive to light, visual fields full to confrontation, extraocular muscles intact, no nystagmus, facial sensation and strength symmetric, hearing intact, palate elevates symmetrically, uvula midline, shoulder shrug symmetric, tongue midline. MOTOR: normal bulk and tone, full strength in the BUE, BLE SENSORY: normal and symmetric to light touch COORDINATION: finger-nose-finger, fine finger movements normal REFLEXES: deep tendon reflexes present and symmetric GAIT/STATION: narrow based gait     DIAGNOSTIC DATA (LABS, IMAGING, TESTING) - I reviewed patient records, labs, notes, testing and imaging myself where available.  Lab Results  Component Value Date   WBC 5.6 03/08/2017   HGB 14.3 03/08/2017   HCT 43.4 03/08/2017   MCV 97.1 03/08/2017   PLT 246 03/08/2017      Component Value Date/Time   NA 138 12/29/2016 1652   NA 140 09/07/2016 1327   K 4.0 12/29/2016 1652   K 3.4 (L) 09/07/2016 1327   CL 105 12/29/2016 1652   CO2 22 12/29/2016 1652   CO2 22 09/07/2016 1327   GLUCOSE 78 12/29/2016 1652   GLUCOSE 83 09/07/2016 1327   BUN 12 12/29/2016 1652   BUN 13.3 09/07/2016 1327   CREATININE 0.70 12/29/2016 1652   CREATININE 0.8 09/07/2016 1327   CALCIUM 8.6 12/29/2016 1652   CALCIUM 8.2 (L) 09/07/2016 1327   PROT 6.5 12/29/2016 1652   PROT 6.3 (L) 09/07/2016 1327   ALBUMIN 4.0 12/29/2016 1652   ALBUMIN 3.2 (L) 09/07/2016 1327   AST 15 12/29/2016 1652   AST 11 09/07/2016 1327   ALT 12 12/29/2016 1652   ALT <9 09/07/2016 1327   ALKPHOS 48 12/29/2016 1652   ALKPHOS 54 09/07/2016 1327   BILITOT 0.3 12/29/2016 1652   BILITOT 0.41 09/07/2016 1327   GFRNONAA >60 09/03/2015 2050  GFRAA >60 09/03/2015 2050   Lab Results  Component Value Date   CHOL 177 12/29/2016   No results  found for: HGBA1C Lab Results  Component Value Date   VITAMINB12 284 03/08/2017   Lab Results  Component Value Date   TSH 2.73 12/29/2016    04/17/10 MRI brain - Essentially normal MRI brain.  There are few non-specific foci of gliosis which can be seen in association with chronic migraine headaches, and are of doubtful clinical significance.  04/17/10 MRA head - normal  01/09/13 CT head - normal    ASSESSMENT AND PLAN  51 y.o. year old female here with history of gastric bypass surgery and intermittent headaches, likely migraine with aura. Stable over last 1 year.    Dx:  Migraine with aura and without status migrainosus, not intractable    PLAN:  I spent 25 minutes of face to face time with patient. Greater than 50% of time was spent in counseling and coordination of care with patient. In summary we discussed:  - increase topiramate to 100mg  twice a day; may consider extended release TPX in future or zonisamide - increase divalproex to 250mg  twice a day - continue eletriptan as needed for migraine rescue - continue to improve nutrition, exercise, stress and sleep issues  Meds ordered this encounter  Medications  . divalproex (DEPAKOTE) 250 MG DR tablet    Sig: Take 1 tablet (250 mg total) by mouth 2 (two) times daily.    Dispense:  180 tablet    Refill:  4  . topiramate (TOPAMAX) 50 MG tablet    Sig: Take 2 tablets (100 mg total) by mouth 2 (two) times daily.    Dispense:  360 tablet    Refill:  4  . eletriptan (RELPAX) 40 MG tablet    Sig: Take 1 tablet (40 mg total) by mouth as needed for migraine or headache. May repeat in 2 hours if needed. Max 2 tabs per day or 8 tabs per month.    Dispense:  10 tablet    Refill:  12   Return in about 6 months (around 10/26/2017).     Penni Bombard, MD 7/35/6701, 4:10 PM Certified in Neurology, Neurophysiology and Neuroimaging  [facility] [address], [place], [place] [address] [phone]

## 2017-05-19 ENCOUNTER — Other Ambulatory Visit: Payer: Self-pay

## 2017-05-19 DIAGNOSIS — D510 Vitamin B12 deficiency anemia due to intrinsic factor deficiency: Secondary | ICD-10-CM

## 2017-05-22 ENCOUNTER — Ambulatory Visit (HOSPITAL_BASED_OUTPATIENT_CLINIC_OR_DEPARTMENT_OTHER): Payer: 59

## 2017-05-22 ENCOUNTER — Telehealth: Payer: Self-pay | Admitting: Diagnostic Neuroimaging

## 2017-05-22 VITALS — BP 126/80 | HR 83 | Temp 97.8°F | Resp 18

## 2017-05-22 DIAGNOSIS — K912 Postsurgical malabsorption, not elsewhere classified: Secondary | ICD-10-CM

## 2017-05-22 DIAGNOSIS — D510 Vitamin B12 deficiency anemia due to intrinsic factor deficiency: Secondary | ICD-10-CM | POA: Diagnosis not present

## 2017-05-22 DIAGNOSIS — Z903 Acquired absence of stomach [part of]: Secondary | ICD-10-CM

## 2017-05-22 LAB — CBC WITH DIFFERENTIAL/PLATELET
BASO%: 0.2 % (ref 0.0–2.0)
BASOS ABS: 0 10*3/uL (ref 0.0–0.1)
EOS%: 0.1 % (ref 0.0–7.0)
Eosinophils Absolute: 0 10*3/uL (ref 0.0–0.5)
HCT: 47.4 % — ABNORMAL HIGH (ref 34.8–46.6)
HEMOGLOBIN: 15.9 g/dL (ref 11.6–15.9)
LYMPH#: 2.4 10*3/uL (ref 0.9–3.3)
LYMPH%: 41.8 % (ref 14.0–49.7)
MCH: 32.1 pg (ref 25.1–34.0)
MCHC: 33.4 g/dL (ref 31.5–36.0)
MCV: 96 fL (ref 79.5–101.0)
MONO#: 0.5 10*3/uL (ref 0.1–0.9)
MONO%: 8 % (ref 0.0–14.0)
NEUT#: 2.8 10*3/uL (ref 1.5–6.5)
NEUT%: 49.9 % (ref 38.4–76.8)
PLATELETS: 258 10*3/uL (ref 145–400)
RBC: 4.94 10*6/uL (ref 3.70–5.45)
RDW: 12 % (ref 11.2–14.5)
WBC: 5.7 10*3/uL (ref 3.9–10.3)

## 2017-05-22 LAB — IRON AND TIBC
%SAT: 40 % (ref 21–57)
Iron: 106 ug/dL (ref 41–142)
TIBC: 261 ug/dL (ref 236–444)
UIBC: 156 ug/dL (ref 120–384)

## 2017-05-22 LAB — FERRITIN: FERRITIN: 438 ng/mL — AB (ref 9–269)

## 2017-05-22 MED ORDER — CYANOCOBALAMIN 1000 MCG/ML IJ SOLN
1000.0000 ug | Freq: Once | INTRAMUSCULAR | Status: AC
  Administered 2017-05-22: 1000 ug via INTRAMUSCULAR

## 2017-05-22 MED ORDER — ZONISAMIDE 25 MG PO CAPS: ORAL_CAPSULE | ORAL | 1 refills | Status: DC

## 2017-05-22 NOTE — Telephone Encounter (Signed)
Spoke with patient who stated she only took increased doses of topamax and depakote for  2 days after being prescribed 04/25/17. Then she went back to lower doses due to feeling drugged on higher doses. She stated that Dr Leta Baptist mentioned two other medication options in her after visit summary. She stated she doesn't see how an extended release topamax can help her due to her history of a gastric bypass. She is asking if zonisamide can be prescribed. This RN advised her Dr Leta Baptist is out of the office this week. However the note can be sent to the work in dr.  Patient stated she has had a headache x 1 week. This RN inquired if she is taking the relpax prn. Patient stated she is, and it has helped. She has taken 5 tablets. This RN inquired if she has had an infusion before. She stated she has received Depacon in this office in the past. She stated she wanted to wait to see how she feels in the morning before requesting an infusion. This RN stated her message will be sent to the work in dr, and she will get a call tomorrow. Patient verbalized understanding, appreciation.

## 2017-05-22 NOTE — Patient Instructions (Signed)

## 2017-05-22 NOTE — Telephone Encounter (Signed)
I called patient. The patient recently was increased both on Topamax and Depakote at the same time. She has had some cognitive side effects on these medications. She has cut back to taking 150 mg daily of Topamax and 250 mg daily of the Depakote.  The patient will remain on the same dose of Depakote, we will taper off of the Topamax by 50 mg every 5 days until off the drug, we will start Zonegran.  The patient is allergic to sulfa drugs, she develops a rash on Zonegran she is to stop the drug.

## 2017-05-22 NOTE — Telephone Encounter (Signed)
Pt said increasing Topamax and Depakote makes her feel drugged. Please call

## 2017-05-23 ENCOUNTER — Encounter: Payer: Self-pay | Admitting: Diagnostic Neuroimaging

## 2017-05-23 ENCOUNTER — Ambulatory Visit: Payer: 59

## 2017-05-23 ENCOUNTER — Ambulatory Visit (HOSPITAL_BASED_OUTPATIENT_CLINIC_OR_DEPARTMENT_OTHER): Payer: 59 | Admitting: Family

## 2017-05-23 ENCOUNTER — Encounter: Payer: Self-pay | Admitting: *Deleted

## 2017-05-23 ENCOUNTER — Telehealth: Payer: Self-pay | Admitting: Neurology

## 2017-05-23 ENCOUNTER — Telehealth: Payer: Self-pay | Admitting: Diagnostic Neuroimaging

## 2017-05-23 VITALS — BP 126/67 | HR 70 | Temp 98.0°F | Resp 18 | Wt 165.0 lb

## 2017-05-23 DIAGNOSIS — D508 Other iron deficiency anemias: Secondary | ICD-10-CM

## 2017-05-23 DIAGNOSIS — Z903 Acquired absence of stomach [part of]: Secondary | ICD-10-CM

## 2017-05-23 DIAGNOSIS — E559 Vitamin D deficiency, unspecified: Secondary | ICD-10-CM | POA: Insufficient documentation

## 2017-05-23 DIAGNOSIS — D509 Iron deficiency anemia, unspecified: Secondary | ICD-10-CM | POA: Diagnosis not present

## 2017-05-23 DIAGNOSIS — D500 Iron deficiency anemia secondary to blood loss (chronic): Secondary | ICD-10-CM | POA: Diagnosis not present

## 2017-05-23 DIAGNOSIS — K912 Postsurgical malabsorption, not elsewhere classified: Secondary | ICD-10-CM

## 2017-05-23 DIAGNOSIS — D510 Vitamin B12 deficiency anemia due to intrinsic factor deficiency: Secondary | ICD-10-CM | POA: Diagnosis not present

## 2017-05-23 DIAGNOSIS — N920 Excessive and frequent menstruation with regular cycle: Secondary | ICD-10-CM | POA: Diagnosis not present

## 2017-05-23 DIAGNOSIS — G43109 Migraine with aura, not intractable, without status migrainosus: Secondary | ICD-10-CM | POA: Diagnosis not present

## 2017-05-23 LAB — VITAMIN B12: Vitamin B12: 293 pg/mL (ref 232–1245)

## 2017-05-23 NOTE — Telephone Encounter (Signed)
Patient's came in for prolonged protracted migraine headaches,  She previously benefit Depacon 500 mg times 2, Toradol 60 mg,

## 2017-05-23 NOTE — Progress Notes (Signed)
Hematology and Oncology Follow Up Visit  Megan Davila [id number] 1966-08-06 51 y.o. 05/23/2017   Principle Diagnosis:  Iron deficiency anemia secondary to malabsorption Pernicious anemia saner to gastric bypass Iron deficiency anemia secondary to menorrhagia     Current Therapy:   IV iron as indicated - last received in August 2017 Vitamin B 12 1,000 mcg - self injects at home monthly as needed    Interim History:  Megan Davila is here today for follow-up. She has felt increasingly fatigued over the past week and experienced a migraine this morning that required her going in for an infusion with Neuro prior to coming here. She is feeling better and her headache his resolved. She is still fatigued and states that she feels "drained". Her B 12 was down at 293 yesterday Iron studies are stable.  Her cycles are regular and heavy at times. She has noticed some clots.  No episodes of bleeding, bruising or petechiae. No lymphadenopathy found on exam.  She has had no issue with infection. No fever, chills, n/v, cough, rash, dizziness, SOB, chest pain, palpitations, abdominal pain or changes in bowel or bladder habits.  No swelling, tenderness, numbness or tingling in her extremities. No c/o pain at this time.  She has maintained a good appetite and is staying well hydrated. Her weight is stable.   ECOG Performance Status: 1 - Symptomatic but completely ambulatory  Medications:  Allergies as of 05/23/2017      Reactions   Naproxen Anaphylaxis   Monistat [miconazole] Other (See Comments)   "burning"   Sulfa Antibiotics Hives   Tioconazole Itching      Medication List       Accurate as of 05/23/17  1:45 PM. Always use your most recent med list.          ALPRAZolam 1 MG tablet Commonly known as:  XANAX Take 1 mg by mouth 2 (two) times daily as needed. Anxiety   cyanocobalamin 1000 MCG/ML injection Commonly known as:  (VITAMIN B-12) Monthly injection   DEXILANT 60 MG  capsule Generic drug:  dexlansoprazole Take 60 mg by mouth daily.   divalproex 250 MG DR tablet Commonly known as:  DEPAKOTE Take 1 tablet (250 mg total) by mouth 2 (two) times daily.   eletriptan 40 MG tablet Commonly known as:  RELPAX Take 1 tablet (40 mg total) by mouth as needed for migraine or headache. May repeat in 2 hours if needed. Max 2 tabs per day or 8 tabs per month.   ergocalciferol 50000 units capsule Commonly known as:  VITAMIN D2 Take 50,000 Units by mouth daily. She is taking daily - pr pt   fexofenadine 180 MG tablet Commonly known as:  ALLEGRA Take 180 mg by mouth daily as needed. allergies   fluticasone 50 MCG/ACT nasal spray Commonly known as:  FLONASE Place 1 spray into both nostrils as needed.   lansoprazole 30 MG capsule Commonly known as:  PREVACID Take 30 mg by mouth daily as needed. Acid reflux   multivitamin with minerals tablet Take 1 tablet by mouth daily.   nystatin powder Commonly known as:  nystatin Apply topically 2 (two) times daily.   Polyethyl Glycol-Propyl Glycol 0.4-0.3 % Soln Place 1 drop into both eyes as needed.   ranitidine 150 MG capsule Commonly known as:  ZANTAC   thiamine 50 MG tablet Commonly known as:  VITAMIN B-1 daily.   venlafaxine XR 75 MG 24 hr capsule Commonly known as:  EFFEXOR-XR Take 225 mg  by mouth every morning.   zonisamide 25 MG capsule Commonly known as:  ZONEGRAN One capsule twice daily for 2 weeks, then take 2 capsules twice daily for 2 weeks, then take 3 capsules twice daily       Allergies:  Allergies  Allergen Reactions  . Naproxen Anaphylaxis  . Monistat [Miconazole] Other (See Comments)    "burning"   . Sulfa Antibiotics Hives  . Tioconazole Itching    Past Medical History, Surgical history, Social history, and Family History were reviewed and updated.  Review of Systems: All other 10 point review of systems is negative.   Physical Exam:  weight is 165 lb (74.8 kg). Her oral  temperature is 98 F (36.7 C). Her blood pressure is 126/67 and her pulse is 70. Her respiration is 18 and oxygen saturation is 100%.   Wt Readings from Last 3 Encounters:  05/23/17 165 lb (74.8 kg)  04/25/17 167 lb (75.8 kg)  02/17/17 164 lb (74.4 kg)    Ocular: Sclerae unicteric, pupils equal, round and reactive to light Ear-nose-throat: Oropharynx clear, dentition fair Lymphatic: No cervical, supraclavicular or axillary adenopathy Lungs no rales or rhonchi, good excursion bilaterally Heart regular rate and rhythm, no murmur appreciated Abd soft, nontender, positive bowel sounds, no liver or spleen tip palpated on exam, no fluid wave MSK no focal spinal tenderness, no joint edema Neuro: non-focal, well-oriented, appropriate affect Breasts: Deferred   Lab Results  Component Value Date   WBC 5.7 05/22/2017   HGB 15.9 05/22/2017   HCT 47.4 (H) 05/22/2017   MCV 96.0 05/22/2017   PLT 258 05/22/2017   Lab Results  Component Value Date   FERRITIN 438 (H) 05/22/2017   IRON 106 05/22/2017   TIBC 261 05/22/2017   UIBC 156 05/22/2017   IRONPCTSAT 40 05/22/2017   Lab Results  Component Value Date   RETICCTPCT 1.12 09/07/2016   RBC 4.94 05/22/2017   RETICCTABS 49.62 09/07/2016   No results found for: KPAFRELGTCHN, LAMBDASER, KAPLAMBRATIO No results found for: IGGSERUM, IGA, IGMSERUM No results found for: Odetta Pink, SPEI   Chemistry      Component Value Date/Time   NA 138 12/29/2016 1652   NA 140 09/07/2016 1327   K 4.0 12/29/2016 1652   K 3.4 (L) 09/07/2016 1327   CL 105 12/29/2016 1652   CO2 22 12/29/2016 1652   CO2 22 09/07/2016 1327   BUN 12 12/29/2016 1652   BUN 13.3 09/07/2016 1327   CREATININE 0.70 12/29/2016 1652   CREATININE 0.8 09/07/2016 1327      Component Value Date/Time   CALCIUM 8.6 12/29/2016 1652   CALCIUM 8.2 (L) 09/07/2016 1327   ALKPHOS 48 12/29/2016 1652   ALKPHOS 54 09/07/2016 1327   AST  15 12/29/2016 1652   AST 11 09/07/2016 1327   ALT 12 12/29/2016 1652   ALT <9 09/07/2016 1327   BILITOT 0.3 12/29/2016 1652   BILITOT 0.41 09/07/2016 1327      Impression and Plan: Megan Davila is a pleasant 51 yo caucasian female with iron deficiency and pernicious anemia secondary to malabsorption since having her gastric bypass. She is feeling fatigued and is recuperating from a severe migraine earlier today. Her B12 was down to 293 so she received an injection yesterday. Iron studies are stable. I added a vitamin D level which is pending at this time.  We will plan to check her lab work monthly to assess for changes and 6 months follow-up  with provider. She is in agreement with this plan.  She will contact our office with any other questions or concerns. We can certainly see her sooner if need be.   Eliezer Bottom, NP 6/19/20181:45 PM

## 2017-05-23 NOTE — Telephone Encounter (Signed)
Pt in lobby wanting an infusion for migraine. Said she will wait until she hears from someone.

## 2017-05-23 NOTE — Telephone Encounter (Signed)
See phone note from this morning, Dr Krista Blue.

## 2017-05-23 NOTE — Telephone Encounter (Signed)
See previous phone notes- patient came into lobby today requesting migraine infusion. Dr Leta Baptist is out of the office; Dr Krista Blue is work in dr. This RN spoke with patient; she does not have a driver, stated she does not need medication for nausea. Dr Krista Blue reviewed patient's chart, ordered Depacon, Toradol.  Patient taken to infusion room; Otila Kluver RN will administer migraine infusion. Reviewed Dr Jannifer Franklin' instructions re: beginning new Rx for Zonegran. Patient verbalized understanding, appreciation.

## 2017-05-25 ENCOUNTER — Ambulatory Visit: Payer: 59 | Admitting: Hematology & Oncology

## 2017-05-25 ENCOUNTER — Ambulatory Visit: Payer: 59

## 2017-05-29 ENCOUNTER — Telehealth: Payer: Self-pay | Admitting: Neurology

## 2017-05-29 NOTE — Telephone Encounter (Signed)
Called and spoke with Roselyn Reef. She stated patient has allergy to sulfa drugs. This medication flags allergy for patient. She is wondering if pt/MD aware. Advised this is a Dr Leta Baptist patient, but Dr Jannifer Franklin called in rx on day Dr Leta Baptist was out of office on 05/22/17. Advised Dr Jannifer Franklin aware of allergy. Per CW,MD note on 05/22/17: "The patient is allergic to sulfa drugs, she develops a rash on Zonegran she is to stop the drug."  She verbalized understanding and placed note in pt chart. Nothing further needed at this time.

## 2017-05-29 NOTE — Telephone Encounter (Signed)
Jamie with [employer] is calling to discuss medication zonisamide (ZONEGRAN) 25 MG capsule having an  allergic reaction to the patient.

## 2017-05-29 NOTE — Telephone Encounter (Signed)
Tried [employer] back. She was on break at time of call. I was advised to call back in about 25 min.

## 2017-06-15 ENCOUNTER — Telehealth: Payer: Self-pay | Admitting: Diagnostic Neuroimaging

## 2017-06-15 ENCOUNTER — Encounter: Payer: Self-pay | Admitting: *Deleted

## 2017-06-15 DIAGNOSIS — G43109 Migraine with aura, not intractable, without status migrainosus: Secondary | ICD-10-CM | POA: Diagnosis not present

## 2017-06-15 NOTE — Telephone Encounter (Addendum)
I spoke to Megan Davila.  She states that she has had continued headaches/migraines since 1-2 days after last infusion on 05-23-17.  She had relief that lasted only about 1-2 days.    She is requesting another one today (infusion).  .  Her level is 10 with nausea.  She does not have a driver.  She is taking the zonegran and relpax and depakote as ordered.  She states has been out of work for the last 2 wks.  Requesting note for this.   I would send message to Dr. Leta Baptist.  See NP?

## 2017-06-15 NOTE — Telephone Encounter (Signed)
Appointment made 06-19-17 at 0900 with MM/NP.  Requesting anti emetic [facility].  Last day out of work 06-02-17-- Requesting note.

## 2017-06-15 NOTE — Telephone Encounter (Signed)
Ok for migraine infusion today. May also setup appt with Ward Givens NP. -VRP

## 2017-06-15 NOTE — Telephone Encounter (Signed)
Patient is calling to schedule an infusion. She has had a migraine for 2 weeks.

## 2017-06-15 NOTE — Telephone Encounter (Addendum)
Spoke to pt and she will come in at around 0930 today for depacon infusion.( repeat as last visit).Depacon 500mg  x 2 , toradol 60mg .    Relayed to Mindy in [facility].  I told her that she needs to make appt with MM/NP.  Note to be written for today.

## 2017-06-16 ENCOUNTER — Telehealth: Payer: Self-pay | Admitting: Diagnostic Neuroimaging

## 2017-06-16 MED ORDER — PROMETHAZINE HCL 12.5 MG PO TABS: 12.5000 mg | ORAL_TABLET | Freq: Three times a day (TID) | ORAL | 0 refills | Status: DC | PRN

## 2017-06-16 NOTE — Telephone Encounter (Signed)
Phenergan sent in. -VRP

## 2017-06-16 NOTE — Telephone Encounter (Signed)
Called pt and let her know phenergan prescription sent in.  We will see on Monday to evaluate.  Infusion worked for several hours, then woke up with head throbbing.  May need to go to urgent care over w/e if gets bad. She verbalized understanding.

## 2017-06-16 NOTE — Telephone Encounter (Signed)
Pt calling to inform that even after the infusion she woke up in the middle of the night, still dealing with very bad head aches, she would like to be called with a suggestion as to what can be done. Pt also would like to know if something can be called in for her nausea

## 2017-06-16 NOTE — Addendum Note (Signed)
Addended by: Andrey Spearman R on: 06/16/2017 12:31 PM   Modules accepted: Orders

## 2017-06-19 ENCOUNTER — Encounter: Payer: Self-pay | Admitting: Adult Health

## 2017-06-19 ENCOUNTER — Ambulatory Visit (INDEPENDENT_AMBULATORY_CARE_PROVIDER_SITE_OTHER): Payer: 59 | Admitting: Adult Health

## 2017-06-19 VITALS — BP 121/85 | HR 81 | Wt 165.0 lb

## 2017-06-19 DIAGNOSIS — G43011 Migraine without aura, intractable, with status migrainosus: Secondary | ICD-10-CM | POA: Diagnosis not present

## 2017-06-19 DIAGNOSIS — Z5181 Encounter for therapeutic drug level monitoring: Secondary | ICD-10-CM | POA: Diagnosis not present

## 2017-06-19 MED ORDER — DEXAMETHASONE 2 MG PO TABS: ORAL_TABLET | ORAL | 0 refills | Status: DC

## 2017-06-19 MED ORDER — DIVALPROEX SODIUM 250 MG PO DR TAB: DELAYED_RELEASE_TABLET | ORAL | 5 refills | Status: DC

## 2017-06-19 NOTE — Patient Instructions (Signed)
Your Plan:  Continue Zonegran Increase Depakote to 1 tablet in the morning and 2 tablets at bedtime Blood work today Steroid dosepak - Decadron 2 mg:  Day 1: 3 tablets  Day 2: 2 Tablets Day 3: 1 tablet \ Thank you for coming to see [place] at [facility]. I hope we have been able to provide you high quality care today.  You may receive a patient satisfaction survey over the next few weeks. We would appreciate your feedback and comments so that we may continue to improve ourselves and the health of our patients.

## 2017-06-19 NOTE — Progress Notes (Signed)
PATIENT: Megan Davila DOB: February 06, 1966  REASON FOR VISIT: follow up- migraine HISTORY FROM: patient  HISTORY OF PRESENT ILLNESS:  Today 06/19/2017: [name] is a 51 year old female with a history of migraine headaches. She returns today for follow-up. She states that since her last visit with Dr. Leta Baptist she has essentially had a headache daily. She has come in for 2 infusions with temporary benefit. She reports that her headache is located across the forehead and behind the eyes. She does have photophobia and phonophobia. She also reports nausea and vomiting. She is currently taking Zonegran and Depakote. She had gastric bypass surgery in 2002. She reports that she has been out of work due to the ongoing migraine. She denies any new neurological symptoms. She returns today for an evaluation.  UPDATE 04/25/17: Since last visit, HA continue (2-3 attacks per month; each lasting 2-3 days). Has gained some weight gradually in last 2 years (100 --> 167 lbs).   UPDATE 07/13/16: Since last visit, only 1-2 major HA in last year needing ER evaluation. May have had total 6-8 migraine attacks all year. Overall doing well. Tolerating TPX, VPA and zolmitriptan. Still with low iron levels (managed by Dr Marin Olp).   UPDATE 05/20/15: Since last visit, only 6 HA in last 1 year. Had stricture dilation at [facility], complicated by perforation and repair in May 2016. Fortunately, overall doing well now.  UPDATE 05/21/14: Having migraine HAs 3 months out of per year. When she has migraine, it usually lasts up to 1 week at a time, and then she runs out of triptan. Using maxalt and zomig (alternating). Still on TPX and VPA for prevention. Still with stress related to work.  UPDATE 11/13/12 (JM):  She has been doing well without  any severe headaches until today she has a headache 6/10 with sharp pains behind her eyes. She has not taken any pain medications this morning.  She has had a 7 lb weight loss since last  visit now 117lbs, has a history of poor absorption with oral medications.     UPDATE 09/20/12 (JM):  Returns to office since 2011, headaches in the last couple of months, has been persistent with headaches.  She had gastric bypass 2002, pregnant 6 years ago. Was 180 and is now 125lbs.  Has increased stress at work.  Tolerating Topamax well but feels that when she takes she has a loss of appetite.  Helpful with headache.  Increase sensitivity to hearing, photosensitivity, tinnitus and nausea.  She feels the air filters at work are causing headaches.  She is unable to state how often has severe headaches.  Has pain behind both eyes.  Unable to take NSAID's secondary to anaphylaxis.  Seen PCP October 10 and prescribed Relapax but has not tried.    PRIOR HPI (04/01/10, VRP): 51 year old right-handed female with history of gastric bypass surgery presenting for evaluation of new onset severe left-sided headache on March 23, 2010. On March 23, 2010 patient developed sudden onset left-sided headache behind her left eye. This was associated with seeing spots, feeling nausea and photophobia/phonophobia.  Headache lasted for several days. On April 21 she was seen by her primary care physician who prescribed Maxalt (provided mild relief) and hydrocodone (no relief). On April 24 she was evaluated at [facility] emergency room. Since that time her headaches have resolved. She does report persistent mild neck pain and difficulty sleeping. She does report remote history of similar headaches 10 years ago. There is no family history  of migraine headache. She denies numbness or weakness in arms or legs.   REVIEW OF SYSTEMS: Out of a complete 14 system review of symptoms, the patient complains only of the following symptoms, and all other reviewed systems are negative.  Ringing in ears, light sensitivity, blurred vision, murmur, nausea, frequent waking, dizziness, headache  ALLERGIES: Allergies  Allergen Reactions  .  Naproxen Anaphylaxis  . Monistat [Miconazole] Other (See Comments)    "burning"   . Sulfa Antibiotics Hives  . Tioconazole Itching    HOME MEDICATIONS: Outpatient Medications Prior to Visit  Medication Sig Dispense Refill  . ALPRAZolam (XANAX) 1 MG tablet Take 1 mg by mouth 2 (two) times daily as needed. Anxiety      . cyanocobalamin (,VITAMIN B-12,) 1000 MCG/ML injection Monthly injection  0  . DEXILANT 60 MG capsule Take 60 mg by mouth daily.    . divalproex (DEPAKOTE) 250 MG DR tablet Take 1 tablet (250 mg total) by mouth 2 (two) times daily. 180 tablet 4  . eletriptan (RELPAX) 40 MG tablet Take 1 tablet (40 mg total) by mouth as needed for migraine or headache. May repeat in 2 hours if needed. Max 2 tabs per day or 8 tabs per month. 10 tablet 12  . ergocalciferol (VITAMIN D2) 50000 UNITS capsule Take 50,000 Units by mouth daily. She is taking daily - pr pt    . fexofenadine (ALLEGRA) 180 MG tablet Take 180 mg by mouth daily as needed. allergies     . fluticasone (FLONASE) 50 MCG/ACT nasal spray Place 1 spray into both nostrils as needed.  1  . lansoprazole (PREVACID) 30 MG capsule Take 30 mg by mouth daily as needed. Acid reflux      . Multiple Vitamins-Minerals (MULTIVITAMIN WITH MINERALS) tablet Take 1 tablet by mouth daily.      Marland Kitchen nystatin (NYSTATIN) powder Apply topically 2 (two) times daily. 45 g 1  . Polyethyl Glycol-Propyl Glycol 0.4-0.3 % SOLN Place 1 drop into both eyes as needed.    . promethazine (PHENERGAN) 12.5 MG tablet Take 1 tablet (12.5 mg total) by mouth every 8 (eight) hours as needed for nausea or vomiting. 30 tablet 0  . ranitidine (ZANTAC) 150 MG capsule     . thiamine (VITAMIN B-1) 50 MG tablet daily.  0  . venlafaxine XR (EFFEXOR-XR) 75 MG 24 hr capsule Take 225 mg by mouth every morning.  0  . zonisamide (ZONEGRAN) 25 MG capsule One capsule twice daily for 2 weeks, then take 2 capsules twice daily for 2 weeks, then take 3 capsules twice daily 180 capsule 1    No facility-administered medications prior to visit.     PAST MEDICAL HISTORY: Past Medical History:  Diagnosis Date  . Abnormal Pap smear   . Anemia   . Anxiety   . Broken toe 12-22-15   middle toe right foot  . Cataract of both eyes   . Depression   . GERD (gastroesophageal reflux disease)   . Headache(784.0)   . Heart murmur   . History of kidney stones   . 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 Perforated bowel ([place])   . Pernicious anemia 06/19/2015  . PONV (postoperative nausea and vomiting)     PAST SURGICAL HISTORY: Past Surgical History:  Procedure Laterality Date  . CATARACT EXTRACTION, BILATERAL Bilateral 02/2016  . CERVICAL BIOPSY  W/ LOOP ELECTRODE EXCISION  11/2013   LGSIL  . 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 CESAREAN SECTION  2008  . CHOLECYSTECTOMY    . GASTRIC BYPASS  2002  . HYSTEROSCOPY W/D&C  10/14/2011   Procedure: DILATATION AND CURETTAGE (D&C) /HYSTEROSCOPY;  Surgeon: Arloa Koh;  Location: [facility];  Service: Gynecology;  Laterality: Bilateral;  . LASIK    . NOSE SURGERY    . STOMACH SURGERY  04/20/2015   dilation of "connection between stomach and intestine", [facility]  . TONSILLECTOMY    . TONSILLECTOMY    . TUBAL LIGATION  2008    FAMILY HISTORY: Family History  Problem Relation Age of Onset  . Diabetes Mother   . Heart disease Father   . Social phobia Father   . Psychiatric Illness Father   . Hypertension Father   . Stroke Father   . Dementia Father   . Heart disease Unknown   . Diabetes Unknown   . Stroke Unknown   . Hypertension Maternal Grandmother   . Thyroid disease Maternal Grandmother   . Hypertension Paternal Grandmother   . Stroke Paternal Grandfather     SOCIAL HISTORY: Social History   Social History  . Marital status: Divorced    Spouse name: N/A  . Number of children: 1  . Years of education: College   Occupational History  .  [employer]    Social History Main Topics  . Smoking status: Never Smoker  . Smokeless tobacco: Never Used  . Alcohol use 0.0 oz/week     Comment: Once a month-rarely  . Drug use: No  . Sexual activity: Yes    Partners: Male    Birth control/ protection: Surgical     Comment: tubal   Other Topics Concern  . Not on file   Social History Narrative   Pt lives at home with her family.   Caffeine Use: once daily      PHYSICAL EXAM  Vitals:   06/19/17 0833  BP: 121/85  Pulse: 81  Weight: 165 lb (74.8 kg)   Body mass index is 29.23 kg/m.  Generalized: Well developed, in no acute distress   Neurological examination  Mentation: Alert oriented to time, place, history taking. Follows all commands speech and language fluent Cranial nerve II-XII: Pupils were equal round reactive to light. Extraocular movements were full, visual field were full on confrontational test. Facial sensation and strength were normal. Uvula tongue midline. Head turning and shoulder shrug  were normal and symmetric. Motor: The motor testing reveals 5 over 5 strength of all 4 extremities. Good symmetric motor tone is noted throughout.  Sensory: Sensory testing is intact to soft touch on all 4 extremities. No evidence of extinction is noted.  Coordination: Cerebellar testing reveals good finger-nose-finger and heel-to-shin bilaterally.  Gait and station: Gait is normal. Tandem gait is normal. Romberg is negative. No drift is seen.  Reflexes: Deep tendon reflexes are symmetric and normal bilaterally.   DIAGNOSTIC DATA (LABS, IMAGING, TESTING) - I reviewed patient records, labs, notes, testing and imaging myself where available.  Lab Results  Component Value Date   WBC 5.7 05/22/2017   HGB 15.9 05/22/2017   HCT 47.4 (H) 05/22/2017   MCV 96.0 05/22/2017   PLT 258 05/22/2017      Component Value Date/Time   NA 138 12/29/2016 1652   NA 140 09/07/2016 1327   K 4.0 12/29/2016 1652   K 3.4 (L) 09/07/2016 1327   CL  105 12/29/2016 1652   CO2 22 12/29/2016 1652   CO2 22 09/07/2016 1327  GLUCOSE 78 12/29/2016 1652   GLUCOSE 83 09/07/2016 1327   BUN 12 12/29/2016 1652   BUN 13.3 09/07/2016 1327   CREATININE 0.70 12/29/2016 1652   CREATININE 0.8 09/07/2016 1327   CALCIUM 8.6 12/29/2016 1652   CALCIUM 8.2 (L) 09/07/2016 1327   PROT 6.5 12/29/2016 1652   PROT 6.3 (L) 09/07/2016 1327   ALBUMIN 4.0 12/29/2016 1652   ALBUMIN 3.2 (L) 09/07/2016 1327   AST 15 12/29/2016 1652   AST 11 09/07/2016 1327   ALT 12 12/29/2016 1652   ALT <9 09/07/2016 1327   ALKPHOS 48 12/29/2016 1652   ALKPHOS 54 09/07/2016 1327   BILITOT 0.3 12/29/2016 1652   BILITOT 0.41 09/07/2016 1327   GFRNONAA >60 09/03/2015 2050   GFRAA >60 09/03/2015 2050   Lab Results  Component Value Date   CHOL 177 12/29/2016   HDL 58 12/29/2016   LDLCALC 106 (H) 12/29/2016   TRIG 64 12/29/2016   CHOLHDL 3.1 12/29/2016   No results found for: HGBA1C Lab Results  Component Value Date   VITAMINB12 293 05/22/2017   Lab Results  Component Value Date   TSH 2.73 12/29/2016      ASSESSMENT AND PLAN 51 y.o. year old female  has a past medical history of Abnormal Pap smear; Anemia; Anxiety; Broken toe (12-22-15); Cataract of both eyes; Depression; GERD (gastroesophageal reflux disease); Headache(784.0); Heart murmur; History of kidney stones; Intestinal malabsorption following gastrectomy (06/19/2015); Perforated bowel ([place]); Pernicious anemia (06/19/2015); and PONV (postoperative nausea and vomiting). here with:  1. Intractable migraine headache  The patient has had a headache daily for the last 2 months. She has had infusions with only temporary benefit. I will try a steroid Dosepak to see if this will break her headache cycle. I will increase Depakote to 1 tablet the morning and 2 tablets at bedtime. She will continue on current dose of Zonegran. I will check a Depakote level today. She is advised that if her headache does not improve she  should let us know. She is advised that if she develops a severe headache that  is accompanied with concerning symptoms she should go to the emergency room. She will follow-up in 2 months or sooner if needed.     Ward Givens, MSN, NP-C 06/19/2017, 8:37 AM [facility] [address] [place], [place] [address] [phone]

## 2017-06-20 ENCOUNTER — Encounter: Payer: Self-pay | Admitting: *Deleted

## 2017-06-21 ENCOUNTER — Telehealth: Payer: Self-pay | Admitting: *Deleted

## 2017-06-21 LAB — VALPROIC ACID LEVEL: Valproic Acid Lvl: 4 ug/mL — ABNORMAL LOW (ref 50–100)

## 2017-06-21 NOTE — Telephone Encounter (Signed)
Spoke with patient and informed her that her Depakote level was low. Advised her that NP recently increased her dose  to one tablet in the morning and 2 tablets at bedtime.  Patient stated she is taking it as prescribed. She will finish dose pack today. She stated she may have had a "little relief" of headache, is taking Phenergan as needed with relief of nausea.  This RN advised she needs to give medications more time to get into her system for relief. She stated she would call Monday with update on her headache.  This RN then explained to her that Dr Tish Frederickson will be glad to complete FMLA papers for her migraines/headache but not disability papers that this RN received. Patient requested this RN call Christella Scheuermann to ask about proper papers. This RN advised will call by the end of this week. Patient has been out of work continuously since June 29th. This RN advised patient will let her know after talking to [facility] about status of required paperwork. Patient verbalized understanding, appreciation.

## 2017-06-22 NOTE — Progress Notes (Signed)
I reviewed note and agree with plan.   Penni Bombard, MD 12/16/1622, 46:95 AM Certified in Neurology, Neurophysiology and Neuroimaging  [facility] [address] [place], [place] [address] [phone]

## 2017-06-22 NOTE — Telephone Encounter (Signed)
Could not reach [facility] for patient; SSN required. LVM advising patient she needs to call [employer]. Gave her [place] phone number and her incident number and advised her to call.

## 2017-06-27 ENCOUNTER — Encounter: Payer: Self-pay | Admitting: *Deleted

## 2017-06-27 NOTE — Telephone Encounter (Signed)
Agree with plan. Give increased depakote time to work. -VRP

## 2017-06-27 NOTE — Telephone Encounter (Signed)
Dr. Leta Baptist,   I gave this patient a 3 day Dosepak of decadron for her headache. At the time she did not want to do an entire week of prednisone. She has had infusions in the past with only temporary benefit. I increased Depakote. Any other suggestions for her daily headache?

## 2017-06-27 NOTE — Telephone Encounter (Signed)
Pt calling to f/u with RN Stanton Kidney C to inform that she feels she is not absorbing the steroid and may need another round, she is still having the migraines, please call

## 2017-06-27 NOTE — Telephone Encounter (Signed)
Returned patient's call. She stated her migraine is no better . She stated she has blurry vision, is still having intermittent nausea for which she takes Phenergan.  She inquired if a second round of steroids would help. This RN advised will route to [facility] for a reply. This RN asked patient if she has talked to [place] rep. Patient stated they mailed her papers. This RN advised she look at papers, fill out her portions and bring in for this RN to review. Patient stated she has to call Christella Scheuermann rep today because she could not return to work today. Patient verbalized understanding, appreciation of call.

## 2017-06-28 NOTE — Telephone Encounter (Signed)
Please advise patient that after discussing this with Dr. Leta Baptist. She needs to give increase Depakote time to work. Her levels were also low. Please remind patient to take the medication daily.

## 2017-06-28 NOTE — Telephone Encounter (Signed)
Called pt and reiterated what Verneita Griffes, RN had already told her that we do not do STD forms for migraines.  FMLA intermittent leave is what we do for migraines.  She wanted me to fax to her psychiatrist , whom she said would do this for her (Dr Noemi Chapel).  I received fax confirmation [phone], [phone].  She continues with bad headaches.  I told her that she is to continue taking depakote, if gets so severe she may need to go to urgent care or ED. She verbalized understanding.

## 2017-06-28 NOTE — Telephone Encounter (Signed)
Spoke with patient and informed her that Jinny Blossom discussed her concerns with Dr Leta Baptist. Advised her that Dr Leta Baptist stated she needs to give increased dose of Depakote more time to work. Advised her that her levels of Depakote were low, confirmed she is taking it as prescribed on 06/19/17. Also advised she stay well hydrated and avoid triggers. Patient stated she is not aware of any headache triggers she has.  Patient stated she spoke with [employer] yesterday and told them she had no upcoming appointment to release her for work. She stated she would call this office when her blurry vision and headaches improved, and she thought she was ready to return to work. She stated she will need a follow up before returning to work. This RN advised her the follow up could be moved sooner that Sept as currently scheduled if she wants to return to work before Sept. Advised her that if her condition worsens suddenly and /or severely she should go immediately to ED or Urgent Care to be evaluated. Patient verbalized understanding, agreement.

## 2017-07-06 ENCOUNTER — Telehealth: Payer: Self-pay | Admitting: Adult Health

## 2017-07-06 NOTE — Telephone Encounter (Signed)
Spoke with patient and informed her that Dr Leta Baptist will not repeat steroids this soon. Advised her she may come in for an IV infusion, however Tina infusion RN has left for the day. Advised patient this RN will discuss with Otila Kluver in the morning and call her to let her know of availability. Patient stated she should be able to come in at any time. She verbalized understanding, appreciation of call.

## 2017-07-06 NOTE — Telephone Encounter (Signed)
She just completed oral steroids. Therefore she may come in for IV infusion for migraine. -VRP

## 2017-07-06 NOTE — Telephone Encounter (Signed)
Patient called office in reference to dexamethasone (DECADRON) 2 MG tablet.  Patient states migraine has been continuing since visit on 06/21/17, but has eased up a little bit.  Patient would like to know if she is able to get another prescription for dexamethasone (DECADRON) 2 MG tablet or if she is able to come in to have an infusion.  Pharmacy- [facility] [place].  Please call

## 2017-07-07 ENCOUNTER — Encounter: Payer: Self-pay | Admitting: Diagnostic Neuroimaging

## 2017-07-07 NOTE — Telephone Encounter (Signed)
Spoke with Otila Kluver, infusion RN who stated she could treat patient at 11:30 am today. Called patient and informed her. Advised her she must have a driver if she receives medication for nausea. She declined nausea medication, stated she "never has needed it". She verbalized understanding of arrival time.

## 2017-07-13 ENCOUNTER — Emergency Department (HOSPITAL_BASED_OUTPATIENT_CLINIC_OR_DEPARTMENT_OTHER)
Admission: EM | Admit: 2017-07-13 | Discharge: 2017-07-13 | Disposition: A | Discharge status: Home / Self Care | Payer: 59 | Attending: Emergency Medicine | Admitting: Emergency Medicine

## 2017-07-13 ENCOUNTER — Encounter (HOSPITAL_BASED_OUTPATIENT_CLINIC_OR_DEPARTMENT_OTHER): Payer: Self-pay | Admitting: Emergency Medicine

## 2017-07-13 DIAGNOSIS — G43719 Chronic migraine without aura, intractable, without status migrainosus: Secondary | ICD-10-CM | POA: Insufficient documentation

## 2017-07-13 DIAGNOSIS — Z79899 Other long term (current) drug therapy: Secondary | ICD-10-CM | POA: Insufficient documentation

## 2017-07-13 DIAGNOSIS — G43909 Migraine, unspecified, not intractable, without status migrainosus: Secondary | ICD-10-CM | POA: Diagnosis not present

## 2017-07-13 MED ORDER — SODIUM CHLORIDE 0.9 % IV BOLUS (SEPSIS)
1000.0000 mL | Freq: Once | INTRAVENOUS | Status: DC
  Administered 2017-07-13: 1000 mL via INTRAVENOUS

## 2017-07-13 MED ORDER — DEXAMETHASONE SODIUM PHOSPHATE 10 MG/ML IJ SOLN
10.0000 mg | Freq: Once | INTRAMUSCULAR | Status: AC
  Administered 2017-07-13: 10 mg via INTRAVENOUS
  Filled 2017-07-13: qty 1

## 2017-07-13 MED ORDER — DIPHENHYDRAMINE HCL 50 MG/ML IJ SOLN
25.0000 mg | Freq: Once | INTRAMUSCULAR | Status: AC
  Administered 2017-07-13: 25 mg via INTRAVENOUS
  Filled 2017-07-13: qty 1

## 2017-07-13 MED ORDER — PROCHLORPERAZINE EDISYLATE 5 MG/ML IJ SOLN
10.0000 mg | Freq: Once | INTRAMUSCULAR | Status: AC
  Administered 2017-07-13: 10 mg via INTRAVENOUS
  Filled 2017-07-13: qty 2

## 2017-07-13 NOTE — ED Triage Notes (Signed)
Pt reports HA since June. Has seen a neurologist and states medication isn't working.

## 2017-07-13 NOTE — ED Notes (Signed)
Pt stated that she felt like somebody is squeezing her front head.  She also verbalized that she also has  pain under her eyes.   The pain started since June and was seen by her  MD. She also stated that she is seeing floaters and ringing on her ears.  She had a cataract done last year, spring of last year.

## 2017-07-13 NOTE — Discharge Instructions (Signed)
You were evaluated for a migraine headache today. Please follow up with your neurologist as an outpatient.

## 2017-07-13 NOTE — ED Notes (Signed)
ED Provider at bedside. 

## 2017-07-13 NOTE — ED Provider Notes (Signed)
[facility] Provider Note   CSN: [id number] Arrival date & time: 07/13/17  1624     History   Chief Complaint Chief Complaint  Patient presents with  . Migraine    HPI Megan Davila is a 51 y.o. female.  Patient is a 51 yo F with h/o recurrent migraines followed by neurology who presents to the ED with c/o migraine. States this migraine has been here and ongoing since June and last saw her neurologist on 06/19/17. She is here because she is in the middle of switching to another neurologist in September for a second opinion and would like imaging. Her migraine currently feels the same as it has all month, located in the front of head, feels like a pressure, with light sensitivity and some mild nausea but has been tolerating po well. States she also has some blurry vision which is typical for her, no acute vision changes. No focal weakness, numbness.      Past Medical History:  Diagnosis Date  . Abnormal Pap smear   . Anemia   . Anxiety   . Broken toe 12-22-15   middle toe right foot  . Cataract of both eyes   . Depression   . GERD (gastroesophageal reflux disease)   . Headache(784.0)   . Heart murmur   . History of kidney stones   . 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 Perforated bowel ([place])   . Pernicious anemia 06/19/2015  . PONV (postoperative nausea and vomiting)     Patient Active Problem List   Diagnosis Date Noted  . Vitamin D deficiency 05/23/2017  . Anxiety 06/19/2015  . Anemia, iron deficiency 06/19/2015  . Pernicious anemia 06/19/2015  . 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 Protein-calorie malnutrition, severe ([place]) 04/18/2015  . Severe protein-calorie malnutrition ([place]) 04/18/2015  . 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 Bariatric surgery status 04/18/2015  . 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 GERD (gastroesophageal reflux disease) 04/17/2015  . Hypokalemia  04/16/2015  . Migraine with aura 05/21/2014  . 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 CATARACT EXTRACTION, BILATERAL Bilateral 02/2016  . CERVICAL BIOPSY  W/ LOOP ELECTRODE EXCISION  11/2013   LGSIL  . 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 CESAREAN SECTION  2008  . CHOLECYSTECTOMY    . GASTRIC BYPASS  2002  . HYSTEROSCOPY W/D&C  10/14/2011   Procedure: DILATATION AND CURETTAGE (D&C) /HYSTEROSCOPY;  Surgeon: Arloa Koh;  Location: [facility];  Service: Gynecology;  Laterality: Bilateral;  . LASIK    . NOSE SURGERY    . STOMACH SURGERY  04/20/2015   dilation of "connection between stomach and intestine", [facility]  . TONSILLECTOMY    . TONSILLECTOMY    . TUBAL LIGATION  2008    OB History    Gravida Para Term Preterm AB Living   1 1       1    SAB TAB Ectopic Multiple Live Births         1         Home Medications    Prior to Admission medications   Medication Sig Start Date End Date Taking? Authorizing Provider  ALPRAZolam Duanne Moron) 1 MG tablet Take 1 mg by mouth 2 (two) times daily as needed. Anxiety     Yes [provider]  divalproex (DEPAKOTE) 250 MG DR tablet Take 1 tablet PO  in the AM and 2 tablets PO at bedtime 06/19/17  Yes Ward Givens, NP  eletriptan (RELPAX) 40 MG tablet Take 1 tablet (40 mg total) by mouth as needed for migraine or headache. May repeat in 2 hours if needed. Max 2 tabs per day or 8 tabs per month. 04/25/17  Yes Penumalli, Vikram R, MD  lansoprazole (PREVACID) 30 MG capsule Take 30 mg by mouth daily as needed. Acid reflux     Yes [provider]  promethazine (PHENERGAN) 12.5 MG tablet Take 1 tablet (12.5 mg total) by mouth every 8 (eight) hours as needed for nausea or vomiting. 06/16/17  Yes Penumalli, Earlean Polka, MD  zonisamide (ZONEGRAN) 25 MG capsule One capsule twice daily for 2 weeks, then take 2 capsules twice daily  for 2 weeks, then take 3 capsules twice daily 05/22/17  Yes Kathrynn Ducking, MD  cyanocobalamin (,VITAMIN B-12,) 1000 MCG/ML injection Monthly injection 09/03/15   [provider]  dexamethasone (DECADRON) 2 MG tablet Take 3 tablets PO the first day, 2 tablets the second day, and 1 tablet the last day. 06/19/17   Ward Givens, NP  DEXILANT 60 MG capsule Take 60 mg by mouth daily. 02/25/15   [provider]  ergocalciferol (VITAMIN D2) 50000 UNITS capsule Take 50,000 Units by mouth daily. She is taking daily - pr pt    [provider]  fexofenadine (ALLEGRA) 180 MG tablet Take 180 mg by mouth daily as needed. allergies     [provider]  fluticasone (FLONASE) 50 MCG/ACT nasal spray Place 1 spray into both nostrils as needed. 01/17/17   [provider]  Multiple Vitamins-Minerals (MULTIVITAMIN WITH MINERALS) tablet Take 1 tablet by mouth daily.      [provider]  nystatin (NYSTATIN) powder Apply topically 2 (two) times daily. 07/08/16   [place], Holli Humbles, NP  Polyethyl Glycol-Propyl Glycol 0.4-0.3 % SOLN Place 1 drop into both eyes as needed.    [provider]  ranitidine (ZANTAC) 150 MG capsule  05/31/15   [provider]  thiamine (VITAMIN B-1) 50 MG tablet daily. 08/07/15   [provider]  venlafaxine XR (EFFEXOR-XR) 75 MG 24 hr capsule Take 225 mg by mouth every morning. 12/13/16   [provider]    Family History Family History  Problem Relation Age of Onset  . Diabetes Mother   . Heart disease Father   . Social phobia Father   . Psychiatric Illness Father   . Hypertension Father   . Stroke Father   . Dementia Father   . Heart disease Unknown   . Diabetes Unknown   . Stroke Unknown   . Hypertension Maternal Grandmother   . Thyroid disease Maternal Grandmother   . Hypertension Paternal Grandmother   . Stroke Paternal Grandfather     Social History Social History  Substance Use Topics  .  Smoking status: Never Smoker  . Smokeless tobacco: Never Used  . Alcohol use 0.0 oz/week     Comment: Once a month-rarely     Allergies   Naproxen; Monistat [miconazole]; Sulfa antibiotics; and Tioconazole   Review of Systems Review of Systems  Constitutional: Negative for chills and fever.  HENT: Negative for congestion and sinus pain.   Eyes: Positive for photophobia. Negative for visual disturbance.  Respiratory: Negative for shortness of breath.   Cardiovascular: Negative for chest pain.  Gastrointestinal: Positive for nausea. Negative for abdominal pain, constipation, diarrhea and vomiting.  Musculoskeletal: Negative for arthralgias and myalgias.  Neurological: Positive for headaches. Negative for dizziness, facial asymmetry, speech difficulty, weakness, light-headedness and numbness.     Physical Exam Updated Vital Signs BP (!) 134/98 (BP Location: Right Arm)   Pulse 79   Temp 97.6 F (36.4 C) (Oral)   Resp 18   SpO2 100%   Physical Exam  Constitutional: She is oriented to person, place, and time. She appears well-developed and well-nourished. No distress.  HENT:  Head: Normocephalic and atraumatic.  Nose: Nose normal.  Mouth/Throat: Oropharynx is clear and moist.  Eyes: Pupils are equal, round, and reactive to light. Conjunctivae and EOM are normal.  Neck: Normal range of motion. Neck supple.  Cardiovascular: Normal rate, regular rhythm, normal heart sounds and intact distal pulses.   No murmur heard. Pulmonary/Chest: Effort normal and breath sounds normal. No respiratory distress.  Abdominal: Soft. Bowel sounds are normal. She exhibits no distension and no mass. There is no tenderness. There is no rebound and no guarding.  Musculoskeletal: Normal range of motion. She exhibits no edema, tenderness or deformity.  Neurological: She is alert and oriented to person, place, and time. No cranial nerve deficit or sensory deficit. She exhibits normal muscle tone.  Coordination normal.  Skin: Skin is warm and dry. Capillary refill takes less than 2 seconds. No rash noted.  Psychiatric: She has a normal mood and affect.     ED Treatments / Results  Labs (all labs ordered are listed, but only abnormal results are displayed) Labs Reviewed - No data to display  EKG  EKG Interpretation None       Radiology No results found.  Procedures Procedures (including critical care time)  Medications Ordered in ED Medications  prochlorperazine (COMPAZINE) injection 10 mg (10 mg Intravenous Given 07/13/17 1828)  diphenhydrAMINE (BENADRYL) injection 25 mg (25 mg Intravenous Given 07/13/17 1829)  dexamethasone (DECADRON) injection 10 mg (10 mg Intravenous Given 07/13/17 1828)     Initial Impression / Assessment and Plan / ED Course  I have reviewed the triage vital signs and the nursing notes.  Pertinent labs & imaging results that were available during my care of the patient were reviewed by me and considered in my medical decision making (see chart for details).   Patient here with chronic migraine that is unchanged over the past month. No to minimal improvement with migraine cocktail. Instructed patient to follow up with her neurologist as outpatient.   Final Clinical Impressions(s) / ED Diagnoses   Final diagnoses:  Intractable chronic migraine without aura and without status migrainosus    New Prescriptions New Prescriptions   No medications on file     Bufford Lope, DO 07/13/17 Yolanda Manges, MD 07/14/17 [phone]

## 2017-07-18 ENCOUNTER — Ambulatory Visit: Payer: 59 | Admitting: Diagnostic Neuroimaging

## 2017-07-21 ENCOUNTER — Other Ambulatory Visit: Payer: Self-pay | Admitting: Neurology

## 2017-08-09 ENCOUNTER — Ambulatory Visit: Payer: 59 | Admitting: Adult Health

## 2017-08-10 ENCOUNTER — Other Ambulatory Visit: Payer: Self-pay | Admitting: Endocrinology

## 2017-08-10 ENCOUNTER — Other Ambulatory Visit: Payer: Self-pay | Admitting: Obstetrics and Gynecology

## 2017-08-10 DIAGNOSIS — Z1231 Encounter for screening mammogram for malignant neoplasm of breast: Secondary | ICD-10-CM

## 2017-08-10 DIAGNOSIS — N951 Menopausal and female climacteric states: Secondary | ICD-10-CM

## 2017-08-10 DIAGNOSIS — E559 Vitamin D deficiency, unspecified: Secondary | ICD-10-CM

## 2017-09-08 DIAGNOSIS — G44229 Chronic tension-type headache, not intractable: Secondary | ICD-10-CM | POA: Diagnosis not present

## 2017-09-08 DIAGNOSIS — G43009 Migraine without aura, not intractable, without status migrainosus: Secondary | ICD-10-CM | POA: Diagnosis not present

## 2017-09-08 DIAGNOSIS — M7918 Myalgia, other site: Secondary | ICD-10-CM | POA: Diagnosis not present

## 2017-09-11 ENCOUNTER — Other Ambulatory Visit: Payer: 59

## 2017-09-16 ENCOUNTER — Other Ambulatory Visit: Payer: Self-pay | Admitting: Adult Health

## 2017-10-16 DIAGNOSIS — E559 Vitamin D deficiency, unspecified: Secondary | ICD-10-CM | POA: Diagnosis not present

## 2017-10-18 ENCOUNTER — Encounter: Payer: Self-pay | Admitting: Diagnostic Neuroimaging

## 2017-10-18 ENCOUNTER — Ambulatory Visit: Payer: 59 | Admitting: Diagnostic Neuroimaging

## 2017-10-18 VITALS — Ht 63.0 in | Wt 182.8 lb

## 2017-10-18 DIAGNOSIS — R251 Tremor, unspecified: Secondary | ICD-10-CM

## 2017-10-18 DIAGNOSIS — G43109 Migraine with aura, not intractable, without status migrainosus: Secondary | ICD-10-CM

## 2017-10-18 DIAGNOSIS — G43011 Migraine without aura, intractable, with status migrainosus: Secondary | ICD-10-CM | POA: Diagnosis not present

## 2017-10-18 MED ORDER — DIVALPROEX SODIUM 500 MG PO DR TAB: 500.0000 mg | DELAYED_RELEASE_TABLET | Freq: Two times a day (BID) | ORAL | 4 refills | Status: DC

## 2017-10-18 MED ORDER — ZONISAMIDE 100 MG PO CAPS: 100.0000 mg | ORAL_CAPSULE | Freq: Two times a day (BID) | ORAL | 4 refills | Status: DC

## 2017-10-18 NOTE — Progress Notes (Signed)
[facility]  PATIENT: Megan Davila DOB: Dec 18, 1965  REFERRING CLINICIAN:  HISTORY FROM: patient  REASON FOR VISIT: follow up   HISTORICAL  CHIEF COMPLAINT:  Chief Complaint  Patient presents with  . Follow-up  . Migraine    in summer had 3 mo long migraine h/a, finally went away after steroid and IV infusion).  Has since been better.    HISTORY OF PRESENT ILLNESS:   UPDATE (10/18/17, VRP): Since last visit, doing well. Tolerating meds. No alleviating or aggravating factors. Had bad migraine in July and Aug 2018, now resolved. Avg 5 days per month of migraine.  Also with some tremor since summer 2018.   UPDATE (06/19/17, MM): Megan Davila is a 51 year old female with a history of migraine headaches. She returns today for follow-up. She states that since her last visit with Dr. Leta Davila she has essentially had a headache daily. She has come in for 2 infusions with temporary benefit. She reports that her headache is located across the forehead and behind the eyes. She does have photophobia and phonophobia. She also reports nausea and vomiting. She is currently taking Zonegran and Depakote. She had gastric bypass surgery in 2002. She reports that she has been out of work due to the ongoing migraine. She denies any new neurological symptoms. She returns today for an evaluation.  UPDATE 04/25/17: Since last visit, HA continue (2-3 attacks per month; each lasting 2-3 days). Has gained some weight gradually in last 2 years (100 --> 167 lbs).   UPDATE 07/13/16: Since last visit, only 1-2 major HA in last year needing ER evaluation. May have had total 6-8 migraine attacks all year. Overall doing well. Tolerating TPX, VPA and zolmitriptan. Still with low iron levels (managed by Dr Megan Davila).   UPDATE 05/20/15: Since last visit, only 6 HA in last 1 year. Had stricture dilation at [facility], complicated by perforation and repair in May 2016. Fortunately, overall doing well now.  UPDATE  05/21/14: Having migraine HAs 3 months out of per year. When she has migraine, it usually lasts up to 1 week at a time, and then she runs out of triptan. Using maxalt and zomig (alternating). Still on TPX and VPA for prevention. Still with stress related to work.  UPDATE 11/13/12 (JM):  She has been doing well without  any severe headaches until today she has a headache 6/10 with sharp pains behind her eyes. She has not taken any pain medications this morning.  She has had a 7 lb weight loss since last visit now 117lbs, has a history of poor absorption with oral medications.     UPDATE 09/20/12 (JM):  Returns to office since 2011, headaches in the last couple of months, has been persistent with headaches.  She had gastric bypass 2002, pregnant 6 years ago. Was 180 and is now 125lbs.  Has increased stress at work.  Tolerating Topamax well but feels that when she takes she has a loss of appetite.  Helpful with headache.  Increase sensitivity to hearing, photosensitivity, tinnitus and nausea.  She feels the air filters at work are causing headaches.  She is unable to state how often has severe headaches.  Has pain behind both eyes.  Unable to take NSAID's secondary to anaphylaxis.  Seen PCP October 10 and prescribed Relapax but has not tried.    PRIOR HPI (04/01/10, VRP): 51 year old right-handed female with history of gastric bypass surgery presenting for evaluation of new onset severe left-sided headache on March 23, 2010.  On March 23, 2010 patient developed sudden onset left-sided headache behind her left eye. This was associated with seeing spots, feeling nausea and photophobia/phonophobia.  Headache lasted for several days. On April 21 she was seen by her primary care physician who prescribed Maxalt (provided mild relief) and hydrocodone (no relief). On April 24 she was evaluated at [facility] emergency room. Since that time her headaches have resolved. She does report persistent mild neck pain and difficulty  sleeping. She does report remote history of similar headaches 10 years ago. There is no family history of migraine headache. She denies numbness or weakness in arms or legs.   REVIEW OF SYSTEMS: Full 14 system review of systems performed and negative except: murmur tremors.    ALLERGIES: Allergies  Allergen Reactions  . Naproxen Anaphylaxis  . Monistat [Miconazole] Other (See Comments)    "burning"   . Sulfa Antibiotics Hives  . Tioconazole Itching    HOME MEDICATIONS: Outpatient Medications Prior to Visit  Medication Sig Dispense Refill  . ALPRAZolam (XANAX) 1 MG tablet Take 1 mg by mouth 2 (two) times daily as needed. Anxiety      . cyanocobalamin (,VITAMIN B-12,) 1000 MCG/ML injection Monthly injection  0  . DEXILANT 60 MG capsule Take 60 mg by mouth daily.    . divalproex (DEPAKOTE) 250 MG DR tablet Take 1 tablet PO in the AM and 2 tablets PO at bedtime 90 tablet 5  . eletriptan (RELPAX) 40 MG tablet Take 1 tablet (40 mg total) by mouth as needed for migraine or headache. May repeat in 2 hours if needed. Max 2 tabs per day or 8 tabs per month. 10 tablet 12  . ergocalciferol (VITAMIN D2) 50000 UNITS capsule Take 50,000 Units by mouth. She is taking daily M-F - pr pt    . fexofenadine (ALLEGRA) 180 MG tablet Take 180 mg by mouth daily as needed. allergies     . fluticasone (FLONASE) 50 MCG/ACT nasal spray Place 1 spray into both nostrils as needed.  1  . lansoprazole (PREVACID) 30 MG capsule Take 30 mg by mouth daily as needed. Acid reflux      . Multiple Vitamins-Minerals (MULTIVITAMIN WITH MINERALS) tablet Take 1 tablet by mouth daily.      Marland Kitchen nystatin (NYSTATIN) powder Apply topically 2 (two) times daily. 45 g 1  . Polyethyl Glycol-Propyl Glycol 0.4-0.3 % SOLN Place 1 drop into both eyes as needed.    . promethazine (PHENERGAN) 12.5 MG tablet Take 1 tablet (12.5 mg total) by mouth every 8 (eight) hours as needed for nausea or vomiting. 30 tablet 0  . ranitidine (ZANTAC) 150 MG  capsule     . thiamine (VITAMIN B-1) 50 MG tablet daily.  0  . venlafaxine XR (EFFEXOR-XR) 75 MG 24 hr capsule Take 225 mg by mouth every morning.  0  . zonisamide (ZONEGRAN) 25 MG capsule Take 3 capsules (75 mg total) by mouth 2 (two) times daily. 180 capsule 1  . dexamethasone (DECADRON) 2 MG tablet Take 3 tablets PO the first day, 2 tablets the second day, and 1 tablet the last day. (Patient not taking: Reported on 10/18/2017) 6 tablet 0   No facility-administered medications prior to visit.     PAST MEDICAL HISTORY: Past Medical History:  Diagnosis Date  . Abnormal Pap smear   . Anemia   . Anxiety   . Broken toe 12-22-15   middle toe right foot  . Cataract of both eyes   . Depression   .  GERD (gastroesophageal reflux disease)   . Headache(784.0)   . Heart murmur   . History of kidney stones   . 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 Perforated bowel ([place])   . Pernicious anemia 06/19/2015  . PONV (postoperative nausea and vomiting)     PAST SURGICAL HISTORY: Past Surgical History:  Procedure Laterality Date  . CATARACT EXTRACTION, BILATERAL Bilateral 02/2016  . CERVICAL BIOPSY  W/ LOOP ELECTRODE EXCISION  11/2013   LGSIL  . 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 CESAREAN SECTION  2008  . CHOLECYSTECTOMY    . GASTRIC BYPASS  2002  . LASIK    . NOSE SURGERY    . STOMACH SURGERY  04/20/2015   dilation of "connection between stomach and intestine", [facility]  . TONSILLECTOMY    . TONSILLECTOMY    . TUBAL LIGATION  2008    FAMILY HISTORY: Family History  Problem Relation Age of Onset  . Diabetes Mother   . Heart disease Father   . Social phobia Father   . Psychiatric Illness Father   . Hypertension Father   . Stroke Father   . Dementia Father   . Heart disease Unknown   . Diabetes Unknown   . Stroke Unknown   . Hypertension Maternal Grandmother   . Thyroid disease Maternal Grandmother   . Hypertension  Paternal Grandmother   . Stroke Paternal Grandfather     SOCIAL HISTORY:  Social History   Socioeconomic History  . Marital status: Divorced    Spouse name: Not on file  . Number of children: 1  . Years of education: College  . Highest education level: Not on file  Social Needs  . Financial resource strain: Not on file  . Food insecurity - worry: Not on file  . Food insecurity - inability: Not on file  . Transportation needs - medical: Not on file  . Transportation needs - non-medical: Not on file  Occupational History    Employer: [employer]  Tobacco Use  . Smoking status: Never Smoker  . Smokeless tobacco: Never Used  Substance and Sexual Activity  . Alcohol use: Yes    Alcohol/week: 0.0 oz    Comment: Once a month-rarely  . Drug use: No  . Sexual activity: Yes    Partners: Male    Birth control/protection: Surgical    Comment: tubal  Other Topics Concern  . Not on file  Social History Narrative   Pt lives at home with her family.   Caffeine Use: once daily     PHYSICAL EXAM  Vitals:   10/18/17 1523  Weight: 182 lb 12.8 oz (82.9 kg)  Height: 5\' 3"  (1.6 m)    Not recorded      Body mass index is 32.38 kg/m.  GENERAL EXAM: Patient is in no distress; well developed, nourished and groomed; neck is supple  CARDIOVASCULAR: Regular rate and rhythm, no murmurs, no carotid bruits  NEUROLOGIC: MENTAL STATUS: awake, alert, language fluent, comprehension intact, naming intact, fund of knowledge appropriate CRANIAL NERVE:  pupils equal and reactive to light, visual fields full to confrontation, extraocular muscles intact, no nystagmus, facial sensation and strength symmetric, hearing intact, palate elevates symmetrically, uvula midline, shoulder shrug symmetric, tongue midline. MOTOR: FINE POSTURAL TREMOR (SUBTLE); normal bulk and tone, full strength in the BUE, BLE SENSORY: normal and symmetric to light touch COORDINATION: finger-nose-finger, fine  finger movements normal REFLEXES: deep tendon reflexes present and symmetric GAIT/STATION:  narrow based gait; TANDEM STABLE    DIAGNOSTIC DATA (LABS, IMAGING, TESTING) - I reviewed patient records, labs, notes, testing and imaging myself where available.  Lab Results  Component Value Date   WBC 5.7 05/22/2017   HGB 15.9 05/22/2017   HCT 47.4 (H) 05/22/2017   MCV 96.0 05/22/2017   PLT 258 05/22/2017      Component Value Date/Time   NA 138 12/29/2016 1652   NA 140 09/07/2016 1327   K 4.0 12/29/2016 1652   K 3.4 (L) 09/07/2016 1327   CL 105 12/29/2016 1652   CO2 22 12/29/2016 1652   CO2 22 09/07/2016 1327   GLUCOSE 78 12/29/2016 1652   GLUCOSE 83 09/07/2016 1327   BUN 12 12/29/2016 1652   BUN 13.3 09/07/2016 1327   CREATININE 0.70 12/29/2016 1652   CREATININE 0.8 09/07/2016 1327   CALCIUM 8.6 12/29/2016 1652   CALCIUM 8.2 (L) 09/07/2016 1327   PROT 6.5 12/29/2016 1652   PROT 6.3 (L) 09/07/2016 1327   ALBUMIN 4.0 12/29/2016 1652   ALBUMIN 3.2 (L) 09/07/2016 1327   AST 15 12/29/2016 1652   AST 11 09/07/2016 1327   ALT 12 12/29/2016 1652   ALT <9 09/07/2016 1327   ALKPHOS 48 12/29/2016 1652   ALKPHOS 54 09/07/2016 1327   BILITOT 0.3 12/29/2016 1652   BILITOT 0.41 09/07/2016 1327   GFRNONAA >60 09/03/2015 2050   GFRAA >60 09/03/2015 2050   Lab Results  Component Value Date   CHOL 177 12/29/2016   No results found for: HGBA1C Lab Results  Component Value Date   VITAMINB12 293 05/22/2017   Lab Results  Component Value Date   TSH 2.73 12/29/2016    04/17/10 MRI brain - Essentially normal MRI brain.  There are few non-specific foci of gliosis which can be seen in association with chronic migraine headaches, and are of doubtful clinical significance.  04/17/10 MRA head - normal  01/09/13 CT head - normal    ASSESSMENT AND PLAN  51 y.o. year old female here with history of gastric bypass surgery and intermittent headaches, likely migraine with aura. Stable over  last 1 year.    Dx:  Intractable migraine without aura and with status migrainosus  Migraine with aura and without status migrainosus, not intractable  Tremor    PLAN:  - increase zonisamide to 100mg  twice a day  - increase divalproex to 500mg  twice a day - continue eletriptan as needed for migraine rescue - may consider botox or CGRP antagonists in future - continue to improve nutrition, exercise, stress and sleep issues - check labs (for medication monitoring and tremor evaluation)  Meds ordered this encounter  Medications  . zonisamide (ZONEGRAN) 100 MG capsule    Sig: Take 1 capsule (100 mg total) 2 (two) times daily by mouth.    Dispense:  180 capsule    Refill:  4  . divalproex (DEPAKOTE) 500 MG DR tablet    Sig: Take 1 tablet (500 mg total) 2 (two) times daily by mouth.    Dispense:  180 tablet    Refill:  4   Orders Placed This Encounter  Procedures  . CBC with Differential/Platelet  . Comprehensive metabolic panel  . Ammonia  . Valproic acid level  . TSH   Return in about 6 months (around 04/17/2018).     Penni Bombard, MD 50/93/2671, 2:45 PM Certified in Neurology, Neurophysiology and Neuroimaging  [facility] [address] [place], [place] [address] [phone]

## 2017-10-18 NOTE — Patient Instructions (Signed)
-   increase zonisamide to 100mg  twice a day  - increase divalproex to 500mg  twice a day - continue eletriptan as needed for migraine rescue - continue to improve nutrition, exercise, stress and sleep issues - check labs (for medication monitoring and tremor evaluation)

## 2017-10-19 ENCOUNTER — Ambulatory Visit
Admission: RE | Admit: 2017-10-19 | Discharge: 2017-10-19 | Disposition: A | Discharge status: Home / Self Care | Payer: 59 | Source: Ambulatory Visit | Attending: Endocrinology | Admitting: Endocrinology

## 2017-10-19 ENCOUNTER — Ambulatory Visit
Admission: RE | Admit: 2017-10-19 | Discharge: 2017-10-19 | Disposition: A | Discharge status: Home / Self Care | Payer: 59 | Source: Ambulatory Visit | Attending: Obstetrics and Gynecology | Admitting: Obstetrics and Gynecology

## 2017-10-19 ENCOUNTER — Encounter: Payer: Self-pay | Admitting: Radiology

## 2017-10-19 DIAGNOSIS — Z780 Asymptomatic menopausal state: Secondary | ICD-10-CM | POA: Diagnosis not present

## 2017-10-19 DIAGNOSIS — Z1231 Encounter for screening mammogram for malignant neoplasm of breast: Secondary | ICD-10-CM

## 2017-10-19 DIAGNOSIS — N951 Menopausal and female climacteric states: Secondary | ICD-10-CM

## 2017-10-19 DIAGNOSIS — E559 Vitamin D deficiency, unspecified: Secondary | ICD-10-CM

## 2017-10-19 DIAGNOSIS — M810 Age-related osteoporosis without current pathological fracture: Secondary | ICD-10-CM | POA: Diagnosis not present

## 2017-10-19 LAB — CBC WITH DIFFERENTIAL/PLATELET
BASOS: 0 %
Basophils Absolute: 0 10*3/uL (ref 0.0–0.2)
EOS (ABSOLUTE): 0.1 10*3/uL (ref 0.0–0.4)
EOS: 1 %
HEMATOCRIT: 44.7 % (ref 34.0–46.6)
HEMOGLOBIN: 14.8 g/dL (ref 11.1–15.9)
IMMATURE GRANULOCYTES: 0 %
Immature Grans (Abs): 0 10*3/uL (ref 0.0–0.1)
LYMPHS ABS: 1.8 10*3/uL (ref 0.7–3.1)
Lymphs: 28 %
MCH: 32.6 pg (ref 26.6–33.0)
MCHC: 33.1 g/dL (ref 31.5–35.7)
MCV: 99 fL — AB (ref 79–97)
MONOS ABS: 0.5 10*3/uL (ref 0.1–0.9)
Monocytes: 9 %
Neutrophils Absolute: 4 10*3/uL (ref 1.4–7.0)
Neutrophils: 62 %
Platelets: 275 10*3/uL (ref 150–379)
RBC: 4.54 x10E6/uL (ref 3.77–5.28)
RDW: 11.9 % — AB (ref 12.3–15.4)
WBC: 6.4 10*3/uL (ref 3.4–10.8)

## 2017-10-19 LAB — COMPREHENSIVE METABOLIC PANEL
A/G RATIO: 1.8 (ref 1.2–2.2)
ALBUMIN: 4.6 g/dL (ref 3.5–5.5)
ALT: 14 IU/L (ref 0–32)
AST: 22 IU/L (ref 0–40)
Alkaline Phosphatase: 84 IU/L (ref 39–117)
BUN/Creatinine Ratio: 20 (ref 9–23)
BUN: 18 mg/dL (ref 6–24)
Bilirubin Total: 0.3 mg/dL (ref 0.0–1.2)
CALCIUM: 9.5 mg/dL (ref 8.7–10.2)
CO2: 26 mmol/L (ref 20–29)
CREATININE: 0.89 mg/dL (ref 0.57–1.00)
Chloride: 97 mmol/L (ref 96–106)
GFR calc Af Amer: 88 mL/min/{1.73_m2} (ref >59)
GFR, EST NON AFRICAN AMERICAN: 76 mL/min/{1.73_m2} (ref >59)
GLOBULIN, TOTAL: 2.6 g/dL (ref 1.5–4.5)
Glucose: 82 mg/dL (ref 65–99)
Potassium: 4.8 mmol/L (ref 3.5–5.2)
Sodium: 137 mmol/L (ref 134–144)
Total Protein: 7.2 g/dL (ref 6.0–8.5)

## 2017-10-19 LAB — TSH: TSH: 2.32 u[IU]/mL (ref 0.450–4.500)

## 2017-10-19 LAB — AMMONIA: AMMONIA: 55 ug/dL (ref 19–87)

## 2017-10-19 LAB — VALPROIC ACID LEVEL

## 2017-10-20 ENCOUNTER — Telehealth: Payer: Self-pay | Admitting: *Deleted

## 2017-10-20 NOTE — Telephone Encounter (Signed)
Spoke to pt and relayed normal labs except undetected depakote.  She stated she had been taking, reiterated should be taking 500mg  po bid now.  She stated that she was.

## 2017-10-20 NOTE — Telephone Encounter (Signed)
-----   Message from Penni Bombard, MD sent at 10/20/2017 12:07 PM EST ----- Normal labs. Except undetected depakote. Please ensure compliance and correct dosing. Please call patient. -VRP

## 2017-11-22 DIAGNOSIS — G43709 Chronic migraine without aura, not intractable, without status migrainosus: Secondary | ICD-10-CM | POA: Diagnosis not present

## 2017-12-07 ENCOUNTER — Telehealth: Payer: Self-pay | Admitting: Diagnostic Neuroimaging

## 2017-12-07 DIAGNOSIS — G43109 Migraine with aura, not intractable, without status migrainosus: Secondary | ICD-10-CM | POA: Diagnosis not present

## 2017-12-07 NOTE — Telephone Encounter (Signed)
I called the patient.  She reports that she woke up at 1 AM this morning with a severe migraine.  She reports that she took her Relpax and then repeated the dose in 2 hours but has not gotten any relief.  She states that she has been taking Depakote and Zonegran as prescribed.  She reports in the past she had a Depacon infusion with good relief.  I will have her come in today for Depacon IV 500 mg.

## 2017-12-07 NOTE — Telephone Encounter (Signed)
Infusion Rx given to [employer] [facility], infusion suite.

## 2017-12-07 NOTE — Telephone Encounter (Signed)
Pt is needing to speak with Jinny Blossom about her migrain nothing prescribed is working. Pt said a depacon infusion is needing

## 2017-12-07 NOTE — Telephone Encounter (Signed)
Pt is calling she is wanting a depacon infusion today for migraines. Pt said she left a VM this morning at infusion suite. Please call to discuss asap.

## 2017-12-13 DIAGNOSIS — G43709 Chronic migraine without aura, not intractable, without status migrainosus: Secondary | ICD-10-CM | POA: Diagnosis not present

## 2017-12-17 ENCOUNTER — Other Ambulatory Visit: Payer: Self-pay | Admitting: Adult Health

## 2018-01-02 DIAGNOSIS — R0981 Nasal congestion: Secondary | ICD-10-CM | POA: Diagnosis not present

## 2018-01-02 DIAGNOSIS — R05 Cough: Secondary | ICD-10-CM | POA: Diagnosis not present

## 2018-01-02 DIAGNOSIS — R0982 Postnasal drip: Secondary | ICD-10-CM | POA: Diagnosis not present

## 2018-01-04 DIAGNOSIS — G43709 Chronic migraine without aura, not intractable, without status migrainosus: Secondary | ICD-10-CM | POA: Diagnosis not present

## 2018-01-17 ENCOUNTER — Encounter: Payer: Self-pay | Admitting: Diagnostic Neuroimaging

## 2018-01-17 ENCOUNTER — Telehealth: Payer: Self-pay | Admitting: *Deleted

## 2018-01-17 DIAGNOSIS — G43109 Migraine with aura, not intractable, without status migrainosus: Secondary | ICD-10-CM | POA: Diagnosis not present

## 2018-01-17 NOTE — Telephone Encounter (Signed)
Spoke with Otila Kluver, RN who stated patient may come for infusion at 12 noon today. Called patient and advised her of approval for infusion at noon today. She will receive Depacon 500 mg IV per Dr Gladstone Lighter order. She stated she will be here at noon. She stated she is trying to stay hydrated. She verbalized understanding, appreciation.

## 2018-01-17 NOTE — Telephone Encounter (Signed)
Ok for infusion. -VRP

## 2018-01-17 NOTE — Telephone Encounter (Signed)
Received call from patient stating she has had a bad migraine off and on since last Thurs. She stated it lasted 3 days, then she  got a break, but she was up all night last. She stated her son has had the flu, so it may be due to stress. She is taking Depakote, Zonegran twice daily and has taken relpax twice daily each day. She stated she is at work now but wants to come in for an infusion today. She is also having nausea but understands she cannot be medicated for it if she doesn't have a driver. This RN advised will discuss with Dr Leta Baptist and call her back. She verbalized understanding, appreciation.

## 2018-01-17 NOTE — Telephone Encounter (Signed)
Spoke with patient during infusion. She stated she typically doesn't get relief until a few hours after her infusion.

## 2018-02-21 ENCOUNTER — Emergency Department (HOSPITAL_BASED_OUTPATIENT_CLINIC_OR_DEPARTMENT_OTHER)
Admission: EM | Admit: 2018-02-21 | Discharge: 2018-02-21 | Disposition: A | Discharge status: Home / Self Care | Payer: 59 | Attending: Emergency Medicine | Admitting: Emergency Medicine

## 2018-02-21 ENCOUNTER — Encounter (HOSPITAL_BASED_OUTPATIENT_CLINIC_OR_DEPARTMENT_OTHER): Payer: Self-pay | Admitting: Emergency Medicine

## 2018-02-21 ENCOUNTER — Other Ambulatory Visit: Payer: Self-pay

## 2018-02-21 ENCOUNTER — Emergency Department (HOSPITAL_BASED_OUTPATIENT_CLINIC_OR_DEPARTMENT_OTHER): Payer: 59

## 2018-02-21 DIAGNOSIS — Y9389 Activity, other specified: Secondary | ICD-10-CM | POA: Insufficient documentation

## 2018-02-21 DIAGNOSIS — R51 Headache: Secondary | ICD-10-CM | POA: Diagnosis not present

## 2018-02-21 DIAGNOSIS — Y999 Unspecified external cause status: Secondary | ICD-10-CM | POA: Insufficient documentation

## 2018-02-21 DIAGNOSIS — S161XXA Strain of muscle, fascia and tendon at neck level, initial encounter: Secondary | ICD-10-CM | POA: Insufficient documentation

## 2018-02-21 DIAGNOSIS — Z79899 Other long term (current) drug therapy: Secondary | ICD-10-CM | POA: Diagnosis not present

## 2018-02-21 DIAGNOSIS — Y929 Unspecified place or not applicable: Secondary | ICD-10-CM | POA: Insufficient documentation

## 2018-02-21 DIAGNOSIS — M542 Cervicalgia: Secondary | ICD-10-CM | POA: Diagnosis not present

## 2018-02-21 MED ORDER — ACETAMINOPHEN 325 MG PO TABS
650.0000 mg | ORAL_TABLET | Freq: Once | ORAL | Status: AC
  Administered 2018-02-21: 650 mg via ORAL
  Filled 2018-02-21: qty 2

## 2018-02-21 MED ORDER — CYCLOBENZAPRINE HCL 10 MG PO TABS: 10.0000 mg | ORAL_TABLET | Freq: Three times a day (TID) | ORAL | 0 refills | Status: DC | PRN
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
## 2018-02-21 NOTE — Discharge Instructions (Signed)
Your CT showed some evidence of arthritis/degenerative disc disease. As per the radiology report: "Degenerative disc disease most notable at C5-6 and C6-7 has worsened since 2005." Follow up with your primary care doctor, return here if symptoms worsen.
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
## 2018-02-21 NOTE — ED Notes (Signed)
Pt ambulated to treatment room with steady gate.

## 2018-02-22 ENCOUNTER — Telehealth: Payer: Self-pay | Admitting: Obstetrics and Gynecology

## 2018-02-22 ENCOUNTER — Ambulatory Visit: Payer: 59 | Admitting: Obstetrics and Gynecology
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
## 2018-02-22 NOTE — Telephone Encounter (Signed)
Thank you for the update.  I agree.  Encounter closed.

## 2018-02-26 DIAGNOSIS — E538 Deficiency of other specified B group vitamins: Secondary | ICD-10-CM | POA: Diagnosis not present

## 2018-02-26 DIAGNOSIS — M62838 Other muscle spasm: Secondary | ICD-10-CM | POA: Diagnosis not present

## 2018-02-26 DIAGNOSIS — S161XXA Strain of muscle, fascia and tendon at neck level, initial encounter: Secondary | ICD-10-CM | POA: Diagnosis not present

## 2018-03-02 DIAGNOSIS — M546 Pain in thoracic spine: Secondary | ICD-10-CM | POA: Diagnosis not present

## 2018-03-02 DIAGNOSIS — M542 Cervicalgia: Secondary | ICD-10-CM | POA: Diagnosis not present

## 2018-03-06 DIAGNOSIS — M546 Pain in thoracic spine: Secondary | ICD-10-CM | POA: Diagnosis not present

## 2018-03-06 DIAGNOSIS — M542 Cervicalgia: Secondary | ICD-10-CM | POA: Diagnosis not present

## 2018-03-15 DIAGNOSIS — G43709 Chronic migraine without aura, not intractable, without status migrainosus: Secondary | ICD-10-CM | POA: Diagnosis not present

## 2018-03-17 DIAGNOSIS — R05 Cough: Secondary | ICD-10-CM | POA: Diagnosis not present

## 2018-03-17 DIAGNOSIS — R112 Nausea with vomiting, unspecified: Secondary | ICD-10-CM | POA: Diagnosis not present

## 2018-03-19 ENCOUNTER — Telehealth: Payer: Self-pay | Admitting: *Deleted

## 2018-03-19 ENCOUNTER — Other Ambulatory Visit: Payer: Self-pay | Admitting: *Deleted

## 2018-03-19 DIAGNOSIS — D509 Iron deficiency anemia, unspecified: Secondary | ICD-10-CM

## 2018-03-19 NOTE — Telephone Encounter (Signed)
Patient is c/o of feeling weak. She would like to have her iron levels checked.   She states she was diagnosed with the flu on Saturday.   Explained to the patient that we couldn't see patient today for lab work with an active flu diagnosis. We will schedule for later this week. She prefers the [facility] location for blood work. She understands that is she's febrile or showing other symptoms she should reschedule her appointment.

## 2018-03-21 ENCOUNTER — Telehealth: Payer: Self-pay | Admitting: *Deleted

## 2018-03-21 ENCOUNTER — Inpatient Hospital Stay: Payer: 59 | Attending: Hematology & Oncology

## 2018-03-21 DIAGNOSIS — N920 Excessive and frequent menstruation with regular cycle: Secondary | ICD-10-CM | POA: Diagnosis not present

## 2018-03-21 DIAGNOSIS — Z9884 Bariatric surgery status: Secondary | ICD-10-CM | POA: Insufficient documentation

## 2018-03-21 DIAGNOSIS — D510 Vitamin B12 deficiency anemia due to intrinsic factor deficiency: Secondary | ICD-10-CM | POA: Insufficient documentation

## 2018-03-21 DIAGNOSIS — D500 Iron deficiency anemia secondary to blood loss (chronic): Secondary | ICD-10-CM | POA: Insufficient documentation

## 2018-03-21 DIAGNOSIS — D509 Iron deficiency anemia, unspecified: Secondary | ICD-10-CM

## 2018-03-21 LAB — CBC WITH DIFFERENTIAL (CANCER CENTER ONLY)
Basophils Absolute: 0 10*3/uL (ref 0.0–0.1)
Basophils Relative: 0 %
EOS PCT: 1 %
Eosinophils Absolute: 0.1 10*3/uL (ref 0.0–0.5)
HCT: 42.4 % (ref 34.8–46.6)
HEMOGLOBIN: 13.8 g/dL (ref 11.6–15.9)
LYMPHS ABS: 2.9 10*3/uL (ref 0.9–3.3)
LYMPHS PCT: 33 %
MCH: 32.9 pg (ref 25.1–34.0)
MCHC: 32.5 g/dL (ref 31.5–36.0)
MCV: 101 fL (ref 79.5–101.0)
Monocytes Absolute: 0.4 10*3/uL (ref 0.1–0.9)
Monocytes Relative: 5 %
Neutro Abs: 5.3 10*3/uL (ref 1.5–6.5)
Neutrophils Relative %: 61 %
PLATELETS: 256 10*3/uL (ref 145–400)
RBC: 4.2 MIL/uL (ref 3.70–5.45)
RDW: 12.1 % (ref 11.2–14.5)
WBC: 8.7 10*3/uL (ref 3.9–10.3)

## 2018-03-21 LAB — VITAMIN B12: Vitamin B-12: 222 pg/mL (ref 180–914)

## 2018-03-21 LAB — FERRITIN: Ferritin: 295 ng/mL — ABNORMAL HIGH (ref 9–269)

## 2018-03-21 LAB — RETICULOCYTES
RBC.: 4.2 MIL/uL (ref 3.70–5.45)
RETIC COUNT ABSOLUTE: 42 10*3/uL (ref 33.7–90.7)
Retic Ct Pct: 1 % (ref 0.7–2.1)

## 2018-03-21 LAB — IRON AND TIBC
IRON: 84 ug/dL (ref 41–142)
Saturation Ratios: 28 % (ref 21–57)
TIBC: 295 ug/dL (ref 236–444)
UIBC: 211 ug/dL

## 2018-03-21 NOTE — Telephone Encounter (Addendum)
Patient is aware of results ----- Message from [name], MD sent at 03/21/2018  3:06 PM EDT ----- Call - iron level is ok!!  Laurey Arrow

## 2018-03-22 ENCOUNTER — Encounter: Payer: Self-pay | Admitting: *Deleted

## 2018-03-22 ENCOUNTER — Inpatient Hospital Stay (HOSPITAL_BASED_OUTPATIENT_CLINIC_OR_DEPARTMENT_OTHER): Payer: 59 | Admitting: Family

## 2018-03-22 ENCOUNTER — Other Ambulatory Visit: Payer: Self-pay

## 2018-03-22 VITALS — BP 126/87 | HR 92 | Temp 98.1°F | Resp 17 | Wt 186.0 lb

## 2018-03-22 DIAGNOSIS — D500 Iron deficiency anemia secondary to blood loss (chronic): Secondary | ICD-10-CM | POA: Diagnosis not present

## 2018-03-22 DIAGNOSIS — N920 Excessive and frequent menstruation with regular cycle: Secondary | ICD-10-CM

## 2018-03-22 DIAGNOSIS — Z903 Acquired absence of stomach [part of]: Principal | ICD-10-CM

## 2018-03-22 DIAGNOSIS — K912 Postsurgical malabsorption, not elsewhere classified: Secondary | ICD-10-CM

## 2018-03-22 DIAGNOSIS — D510 Vitamin B12 deficiency anemia due to intrinsic factor deficiency: Secondary | ICD-10-CM | POA: Diagnosis not present

## 2018-03-22 DIAGNOSIS — D508 Other iron deficiency anemias: Secondary | ICD-10-CM

## 2018-03-22 LAB — VITAMIN D 25 HYDROXY (VIT D DEFICIENCY, FRACTURES): VIT D 25 HYDROXY: 25.3 ng/mL — AB (ref 30.0–100.0)

## 2018-03-22 NOTE — Progress Notes (Signed)
Hematology and Oncology Follow Up Visit  Megan Davila [id number] 1966-06-12 52 y.o. 03/22/2018   Principle Diagnosis:  Iron deficiency anemia secondary to malabsorption Pernicious anemia saner to gastric bypass Iron deficiency anemia secondary to menorrhagia     Current Therapy:   IV iron as indicated - last received in August2017 Vitamin B 12 1,000 mcg - self injects at home monthly as needed - managed by PCP   Interim History: Megan Davila is here today for follow-up. She is feeling quite fatigued and "has no energy". Her iron studies are within normal limits. Her vitamin D is low at 25 and B 12 is 222. Megan Davila manages her vitamin D and she will speak with her about an alternate to there oral vitamin D she has been taking daily such as injection or infusion.  She administers her B12 herself at home per her PCP. She states she tries to remember this every month. .  She has had no fever, chills, n/v, cough, rash, dizziness, SOB, chest pain, palpitations, abdominal pain or changes in bowel or bladder habits.  No swelling, tenderness, numbness or tingling in her extremities at this time. No c/o pain.  No lymphadenopathy found on exam.  No episodes of bleeding, no bruising or petechiae.  She has a good appetite and is staying well hydrated. Her weight is stable.   ECOG Performance Status: 1 - Symptomatic but completely ambulatory  Medications:  Allergies as of 03/22/2018      Reactions   Naproxen Anaphylaxis   Monistat [miconazole] Other (See Comments)   "burning"   Sulfa Antibiotics Hives   Tioconazole Itching      Medication List        Accurate as of 03/22/18 11:13 AM. Always use your most recent med list.          ALPRAZolam 1 MG tablet Commonly known as:  XANAX Take 1 mg by mouth 2 (two) times daily as needed. Anxiety   cyanocobalamin 1000 MCG/ML injection Commonly known as:  (VITAMIN B-12) Monthly injection   cyclobenzaprine 10 MG tablet Commonly known as:   FLEXERIL Take 1 tablet (10 mg total) by mouth 3 (three) times daily as needed for muscle spasms.   DEXILANT 60 MG capsule Generic drug:  dexlansoprazole Take 60 mg by mouth daily.   divalproex 500 MG DR tablet Commonly known as:  DEPAKOTE Take 1 tablet (500 mg total) 2 (two) times daily by mouth.   eletriptan 40 MG tablet Commonly known as:  RELPAX Take 1 tablet (40 mg total) by mouth as needed for migraine or headache. May repeat in 2 hours if needed. Max 2 tabs per day or 8 tabs per month.   ergocalciferol 50000 units capsule Commonly known as:  VITAMIN D2 Take 50,000 Units by mouth. She is taking daily M-F - Megan Davila   fexofenadine 180 MG tablet Commonly known as:  ALLEGRA Take 180 mg by mouth daily as needed. allergies   fluticasone 50 MCG/ACT nasal spray Commonly known as:  FLONASE Place 1 spray into both nostrils as needed.   lansoprazole 30 MG capsule Commonly known as:  PREVACID Take 30 mg by mouth daily as needed. Acid reflux   multivitamin with minerals tablet Take 1 tablet by mouth daily.   nystatin powder Commonly known as:  nystatin Apply topically 2 (two) times daily.   Polyethyl Glycol-Propyl Glycol 0.4-0.3 % Soln Place 1 drop into both eyes as needed.   promethazine 12.5 MG tablet Commonly known as:  PHENERGAN Take 1 tablet (12.5 mg total) by mouth every 8 (eight) hours as needed for nausea or vomiting.   ranitidine 150 MG capsule Commonly known as:  ZANTAC   thiamine 50 MG tablet Commonly known as:  VITAMIN B-1 daily.   venlafaxine XR 75 MG 24 hr capsule Commonly known as:  EFFEXOR-XR Take 225 mg by mouth every morning.   zonisamide 100 MG capsule Commonly known as:  ZONEGRAN Take 1 capsule (100 mg total) 2 (two) times daily by mouth.       Allergies:  Allergies  Allergen Reactions  . Naproxen Anaphylaxis  . Monistat [Miconazole] Other (See Comments)    "burning"   . Sulfa Antibiotics Hives  . Tioconazole Itching    Past Medical  History, Surgical history, Social history, and Family History were reviewed and updated.  Review of Systems: All other 10 point review of systems is negative.   Physical Exam:  vitals were not taken for this visit.   Wt Readings from Last 3 Encounters:  02/21/18 177 lb (80.3 kg)  10/18/17 182 lb 12.8 oz (82.9 kg)  06/19/17 165 lb (74.8 kg)    Ocular: Sclerae unicteric, pupils equal, round and reactive to light Ear-nose-throat: Oropharynx clear, dentition fair Lymphatic: No cervical, supraclavicular or axillary adenopathy Lungs no rales or rhonchi, good excursion bilaterally Heart regular rate and rhythm, no murmur appreciated Abd soft, nontender, positive bowel sounds, no liver or spleen tip palpated on exam, no fluid wave  MSK no focal spinal tenderness, no joint edema Neuro: non-focal, well-oriented, appropriate affect Breasts: Deferred   Lab Results  Component Value Date   WBC 8.7 03/21/2018   HGB 14.8 10/18/2017   HCT 42.4 03/21/2018   MCV 101.0 03/21/2018   PLT 256 03/21/2018   Lab Results  Component Value Date   FERRITIN 295 (H) 03/21/2018   IRON 84 03/21/2018   TIBC 295 03/21/2018   UIBC 211 03/21/2018   IRONPCTSAT 28 03/21/2018   Lab Results  Component Value Date   RETICCTPCT 1.0 03/21/2018   RBC 4.20 03/21/2018   RBC 4.20 03/21/2018   RETICCTABS 49.62 09/07/2016   No results found for: KPAFRELGTCHN, LAMBDASER, KAPLAMBRATIO No results found for: IGGSERUM, IGA, IGMSERUM No results found for: Megan Davila, SPEI   Chemistry      Component Value Date/Time   NA 137 10/18/2017 1630   NA 140 09/07/2016 1327   K 4.8 10/18/2017 1630   K 3.4 (L) 09/07/2016 1327   CL 97 10/18/2017 1630   CO2 26 10/18/2017 1630   CO2 22 09/07/2016 1327   BUN 18 10/18/2017 1630   BUN 13.3 09/07/2016 1327   CREATININE 0.89 10/18/2017 1630   CREATININE 0.70 12/29/2016 1652   CREATININE 0.8 09/07/2016 1327      Component  Value Date/Time   CALCIUM 9.5 10/18/2017 1630   CALCIUM 8.2 (L) 09/07/2016 1327   ALKPHOS 84 10/18/2017 1630   ALKPHOS 54 09/07/2016 1327   AST 22 10/18/2017 1630   AST 11 09/07/2016 1327   ALT 14 10/18/2017 1630   ALT <9 09/07/2016 1327   BILITOT 0.3 10/18/2017 1630   BILITOT 0.41 09/07/2016 1327      Impression and Plan: Megan Davila is a very pleasant 52 yo caucasian female with iron deficiency anemia secondary to malabsorption since having gastric bypass. She is symptomatic with fatigue at this time.  Her iron studies are stable at this time.  She does have low vitamin D  despite being on an oral iron supplement. This is managed by Dr. Marlis Edelson and she is on the phone with their office wat this time enquiring about an alternate to oral vitamin D such as injection or infusion.  We will plan to see her back in another 2 months for follow-up.  She will contact our office with any questions or concerns. We can certainly see her sooner if need be.   Laverna Peace, NP 4/18/201911:13 AM

## 2018-04-03 DIAGNOSIS — Z6833 Body mass index (BMI) 33.0-33.9, adult: Secondary | ICD-10-CM | POA: Diagnosis not present

## 2018-04-03 DIAGNOSIS — Z1321 Encounter for screening for nutritional disorder: Secondary | ICD-10-CM | POA: Diagnosis not present

## 2018-04-03 DIAGNOSIS — E669 Obesity, unspecified: Secondary | ICD-10-CM | POA: Diagnosis not present

## 2018-04-03 DIAGNOSIS — Z9884 Bariatric surgery status: Secondary | ICD-10-CM | POA: Diagnosis not present

## 2018-04-05 DIAGNOSIS — E559 Vitamin D deficiency, unspecified: Secondary | ICD-10-CM | POA: Diagnosis not present

## 2018-04-05 DIAGNOSIS — R635 Abnormal weight gain: Secondary | ICD-10-CM | POA: Diagnosis not present

## 2018-04-05 DIAGNOSIS — Z9884 Bariatric surgery status: Secondary | ICD-10-CM | POA: Diagnosis not present

## 2018-04-05 DIAGNOSIS — E538 Deficiency of other specified B group vitamins: Secondary | ICD-10-CM | POA: Diagnosis not present

## 2018-04-05 DIAGNOSIS — E038 Other specified hypothyroidism: Secondary | ICD-10-CM | POA: Diagnosis not present

## 2018-04-09 DIAGNOSIS — G43709 Chronic migraine without aura, not intractable, without status migrainosus: Secondary | ICD-10-CM | POA: Diagnosis not present

## 2018-04-13 NOTE — Progress Notes (Signed)
52 y.o. G1P1 Divorced Caucasian female here for annual exam.    Patient wants complete STD testing.  Gaining weight and feels upset about it.  Almost 30 pounds since her last office visit here.  Seen at [facility] and was told her had low thyroid function on 04/03/18.  TSH 3.898 and free T4 0.66. Saw Dr. Chalmers Cater 48 hours later who states that her thyroid function was normal and that she had a low vit D level. She is not on thyroid medication.  She has an appointment at [facility] in 3 months.   Ate [employer] for 2 months and lost 10 pounds.   Menses are skipping.  Had 9 menses.  Menses last 3 - 4 days.  Some hot flashes?  Good friend is dying.  Really sad about this.   PCP:   Carol Ada, MD  Patient's last menstrual period was 04/13/2018 (exact date).           Sexually active: No. occ. The current method of family planning is tubal ligation.    Exercising: No.  no Smoker:  no  Health Maintenance: Pap: 02-17-17 Neg:Neg HR HPV, 11-20-15 Neg:Neg HR HPV History of abnormal Pap:  Yes,  1993 Hx conization of cervix--CIN 3, 10-07-13 pap Ascus:Pos HR HPV;LEEP procedure--LGSIL.05-14-14 pap Neg:Pos HR HPV with repeat pap 10-09-14 Neg:Neg HR HPV. MMG: 10-19-17 Density B/Neg/BiRads1 Colonoscopy: 03/2015 normal BMD: 10-19-17  Result Osteopenia right hip and radius. TDaP:  10-07-13 Gardasil:   no HIV: 12-29-16 NR Hep C: 12-29-16 Neg Screening Labs:   -----    reports that she has never smoked. She has never used smokeless tobacco. She reports that she drinks alcohol. She reports that she does not use drugs.  Past Medical History:  Diagnosis Date  . Abnormal Pap smear   . Anemia   . Anxiety   . Broken toe 12-22-15   middle toe right foot  . Cataract of both eyes   . Depression   . GERD (gastroesophageal reflux disease)   . Headache(784.0)   . Heart murmur   . History of kidney stones   . 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 Perforated bowel ([place])   . Pernicious  anemia 06/19/2015  . PONV (postoperative nausea and vomiting)   . Thyroid disease    hypothyrodism    Past Surgical History:  Procedure Laterality Date  . CATARACT EXTRACTION, BILATERAL Bilateral 02/2016  . CERVICAL BIOPSY  W/ LOOP ELECTRODE EXCISION  11/2013   LGSIL  . 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 CESAREAN SECTION  2008  . CHOLECYSTECTOMY    . GASTRIC BYPASS  2002  . HYSTEROSCOPY W/D&C  10/14/2011   Procedure: DILATATION AND CURETTAGE (D&C) /HYSTEROSCOPY;  Surgeon: Arloa Koh;  Location: [facility];  Service: Gynecology;  Laterality: Bilateral;  . LASIK    . NOSE SURGERY    . STOMACH SURGERY  04/20/2015   dilation of "connection between stomach and intestine", [facility]  . TONSILLECTOMY    . TONSILLECTOMY    . TUBAL LIGATION  2008    Current Outpatient Medications  Medication Sig Dispense Refill  . ALPRAZolam (XANAX) 1 MG tablet Take 1 mg by mouth 2 (two) times daily as needed. Anxiety      . cyanocobalamin (,VITAMIN B-12,) 1000 MCG/ML injection Monthly injection  0  . DEXILANT 60 MG capsule Take 60 mg by mouth daily.    . divalproex (DEPAKOTE) 500 MG DR tablet Take  1 tablet (500 mg total) 2 (two) times daily by mouth. 180 tablet 4  . eletriptan (RELPAX) 40 MG tablet Take 1 tablet (40 mg total) by mouth as needed for migraine or headache. May repeat in 2 hours if needed. Max 2 tabs per day or 8 tabs per month. 10 tablet 12  . ergocalciferol (VITAMIN D2) 50000 UNITS capsule Take 50,000 Units by mouth. She is taking daily M-F - pr pt    . fexofenadine (ALLEGRA) 180 MG tablet Take 180 mg by mouth daily as needed. allergies     . fluticasone (FLONASE) 50 MCG/ACT nasal spray Place 1 spray into both nostrils as needed.  1  . imipramine (TOFRANIL) 50 MG tablet Take 2 tablets by mouth daily.  0  . lansoprazole (PREVACID) 30 MG capsule Take 30 mg by mouth daily as needed. Acid reflux      . Multiple Vitamins-Minerals (MULTIVITAMIN WITH MINERALS)  tablet Take 1 tablet by mouth daily.      Marland Kitchen nystatin (NYSTATIN) powder Apply topically 2 (two) times daily. 45 g 1  . Polyethyl Glycol-Propyl Glycol 0.4-0.3 % SOLN Place 1 drop into both eyes as needed.    . ranitidine (ZANTAC) 150 MG capsule     . thiamine (VITAMIN B-1) 50 MG tablet daily.  0  . tiZANidine (ZANAFLEX) 4 MG tablet Take 1 tablet by mouth daily.  0  . venlafaxine XR (EFFEXOR-XR) 150 MG 24 hr capsule Take 1 capsule by mouth daily.  0  . zonisamide (ZONEGRAN) 100 MG capsule Take 1 capsule (100 mg total) 2 (two) times daily by mouth. 180 capsule 4   No current facility-administered medications for this visit.     Family History  Problem Relation Age of Onset  . Diabetes Mother   . Heart disease Father   . Social phobia Father   . Psychiatric Illness Father   . Hypertension Father   . Stroke Father   . Dementia Father   . Heart disease Unknown   . Diabetes Unknown   . Stroke Unknown   . Hypertension Maternal Grandmother   . Thyroid disease Maternal Grandmother   . Hypertension Paternal Grandmother   . Stroke Paternal Grandfather   . Breast cancer Neg Hx     Review of Systems  Constitutional: Positive for unexpected weight change.  HENT: Negative.   Eyes: Negative.   Respiratory: Negative.   Cardiovascular: Negative.   Gastrointestinal: Negative.   Endocrine: Negative.   Genitourinary: Negative.   Musculoskeletal: Negative.   Skin: Negative.   Allergic/Immunologic: Negative.   Neurological: Positive for headaches.  Hematological: Negative.   Psychiatric/Behavioral: Negative.     Exam:   BP 124/80 (BP Location: Right Arm, Patient Position: Sitting, Cuff Size: Large)   Pulse 80   Resp 18   Ht 5\' 3"  (1.6 m)   Wt 187 lb 9.6 oz (85.1 kg)   LMP 04/13/2018 (Exact Date)   BMI 33.23 kg/m     General appearance: alert, cooperative and appears stated age Head: Normocephalic, without obvious abnormality, atraumatic Neck: no adenopathy, supple, symmetrical,  trachea midline and thyroid normal to inspection and palpation Lungs: clear to auscultation bilaterally Breasts: normal appearance, no masses or tenderness, No nipple retraction or dimpling, No nipple discharge or bleeding, No axillary or supraclavicular adenopathy Heart: regular rate and rhythm Abdomen: soft, non-tender; no masses, no organomegaly Extremities: extremities normal, atraumatic, no cyanosis or edema Skin: Skin color, texture, turgor normal. No rashes or lesions Lymph nodes: Cervical, supraclavicular, and axillary  nodes normal. No abnormal inguinal nodes palpated Neurologic: Grossly normal  Pelvic: External genitalia:  no lesions              Urethra:  normal appearing urethra with no masses, tenderness or lesions              Bartholins and Skenes: normal                 Vagina: normal appearing vagina with normal color and discharge, no lesions              Cervix: no lesions              Pap taken: Yes.   Bimanual Exam:  Uterus:  normal size, contour, position, consistency, mobility, non-tender              Adnexa: no mass, fullness, tenderness              Rectal exam: Yes.  .  Confirms.              Anus:  normal sphincter tone, no lesions  Chaperone was present for exam.  Assessment:   Well woman visit with normal exam. Hx of conization of cervix and LEEP. Hx LGSIL. Hx weight loss surgery.  Weight gain.  Thyroid hormone fluctuation.  Mild GSI.   Plan: Mammogram screening. Recommended self breast awareness. Pap and HR HPV as above. Guidelines for Calcium, Vitamin D, regular exercise program including cardiovascular and weight bearing exercise. We talked about thyroid disease and how this needs to be monitored over time to see if a trend is happening.  I recommend she follow up with Dr. Chalmers Cater to manage her thyroid and Gaspar Cola to monitor her weight loss program.  We is going to join the [facility] and increase her exercise.  We talked about her weight gain  likely being multifactorial - weight loss surgery alterations, perimenopause, lifestyle change, potential hypothyroidism.  I recommend her weight loss to be consistent and paced.  STD screening today.  I gave her a brochure for counseling at [facility], and I mentioned Hospice as well. "When Bad Things Happen to Good People" also suggested for reading.  Follow up annually and prn.   After visit summary provided.

## 2018-04-17 ENCOUNTER — Encounter

## 2018-04-17 ENCOUNTER — Encounter: Payer: Self-pay | Admitting: Obstetrics and Gynecology

## 2018-04-17 ENCOUNTER — Other Ambulatory Visit: Payer: Self-pay

## 2018-04-17 ENCOUNTER — Ambulatory Visit: Payer: 59 | Admitting: Obstetrics and Gynecology

## 2018-04-17 ENCOUNTER — Other Ambulatory Visit (HOSPITAL_COMMUNITY)
Admission: RE | Admit: 2018-04-17 | Discharge: 2018-04-17 | Disposition: A | Discharge status: Home / Self Care | Payer: 59 | Source: Ambulatory Visit | Attending: Obstetrics and Gynecology | Admitting: Obstetrics and Gynecology

## 2018-04-17 VITALS — BP 124/80 | HR 80 | Resp 18 | Ht 63.0 in | Wt 187.6 lb

## 2018-04-17 DIAGNOSIS — Z113 Encounter for screening for infections with a predominantly sexual mode of transmission: Secondary | ICD-10-CM | POA: Insufficient documentation

## 2018-04-17 DIAGNOSIS — Z01419 Encounter for gynecological examination (general) (routine) without abnormal findings: Secondary | ICD-10-CM | POA: Insufficient documentation

## 2018-04-17 NOTE — Patient Instructions (Signed)

## 2018-04-18 LAB — HEP, RPR, HIV PANEL
HIV Screen 4th Generation wRfx: NONREACTIVE
Hepatitis B Surface Ag: NEGATIVE
RPR Ser Ql: NONREACTIVE

## 2018-04-18 LAB — HEPATITIS C ANTIBODY

## 2018-04-20 LAB — CYTOLOGY - PAP
Chlamydia: NEGATIVE
HPV (WINDOPATH): DETECTED — AB
Neisseria Gonorrhea: NEGATIVE
Trichomonas: NEGATIVE

## 2018-04-23 ENCOUNTER — Encounter: Payer: Self-pay | Admitting: Diagnostic Neuroimaging

## 2018-04-23 ENCOUNTER — Telehealth: Payer: Self-pay | Admitting: *Deleted

## 2018-04-23 ENCOUNTER — Ambulatory Visit: Payer: 59 | Admitting: Diagnostic Neuroimaging

## 2018-04-23 VITALS — BP 132/91 | HR 95 | Ht 63.0 in | Wt 184.6 lb

## 2018-04-23 DIAGNOSIS — G43011 Migraine without aura, intractable, with status migrainosus: Secondary | ICD-10-CM | POA: Diagnosis not present

## 2018-04-23 DIAGNOSIS — G43109 Migraine with aura, not intractable, without status migrainosus: Secondary | ICD-10-CM

## 2018-04-23 DIAGNOSIS — R87612 Low grade squamous intraepithelial lesion on cytologic smear of cervix (LGSIL): Secondary | ICD-10-CM

## 2018-04-23 DIAGNOSIS — R87810 Cervical high risk human papillomavirus (HPV) DNA test positive: Secondary | ICD-10-CM

## 2018-04-23 MED ORDER — DIVALPROEX SODIUM 500 MG PO DR TAB: 500.0000 mg | DELAYED_RELEASE_TABLET | Freq: Two times a day (BID) | ORAL | 4 refills | Status: DC

## 2018-04-23 MED ORDER — ZONISAMIDE 100 MG PO CAPS
100.0000 mg | ORAL_CAPSULE | Freq: Two times a day (BID) | ORAL | 4 refills | Status: DC
Start: 2018-04-23 — End: 2020-10-07

## 2018-04-23 MED ORDER — ELETRIPTAN HYDROBROMIDE 40 MG PO TABS: 40.0000 mg | ORAL_TABLET | ORAL | 12 refills | Status: DC | PRN

## 2018-04-23 NOTE — Telephone Encounter (Signed)
Notes recorded by Burnice Logan, RN on 04/23/2018 at 2:01 PM EDT Left message to call Sharee Pimple at [phone].

## 2018-04-23 NOTE — Telephone Encounter (Signed)
-----   Message from Nunzio Cobbs, MD sent at 04/23/2018 11:09 AM EDT ----- Please contact patient with results.  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  Her HR HPV test is positive.  She needs a colposcopy with me.  Please schedule and send to precert.   Her testing is negative for HIV, syphilis, hep B and C, gonorrhea, chlamydia, and trichomonas.

## 2018-04-23 NOTE — Telephone Encounter (Signed)
Ok to proceed with colposcopy as long as not having heavy bleeding at the time.

## 2018-04-23 NOTE — Telephone Encounter (Signed)
Spoke with patient, advised as seen below per Dr. Quincy Simmonds. Tubal ligation for contraceptive. LMP -unsure of exact dates, had one 2 wks prior to AEX and started again 2 days after pap. Currently spotting, denies any other GYN symptoms or pain.    Colpo scheduled for 5/24 at 1:30pm with Dr. Quincy Simmonds. Patient unable to take motrin, advised can take 2 reg tylenol po 1 hr prior, eat and hydrate well. Advised should bleeding become heavy, return call to office, colpo would need to be rescheduled. Will review irregular bleeding with Dr. Quincy Simmonds and return call if any additional recommendations.   Order placed for colpo.   Dr. Quincy Simmonds  -ok to proceed with colpo as scheduled?   Cc: Magdalene Patricia, [address] [employer]
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
## 2018-04-24 NOTE — Progress Notes (Signed)
Fax confirmation received relpax [facility] [phone].

## 2018-04-24 NOTE — Telephone Encounter (Signed)
Will close encounter

## 2018-04-24 NOTE — Progress Notes (Signed)
Spoke to pharmacist Sushmi, and she needed to clarify the # of tabs and refills along with instructions for relpax for this pt.  I relayed the instructions written on the prescription.  May give 8 tabs/month (as they come in 8 per box).

## 2018-04-25 ENCOUNTER — Other Ambulatory Visit: Payer: Self-pay | Admitting: Diagnostic Neuroimaging

## 2018-04-27 ENCOUNTER — Other Ambulatory Visit: Payer: Self-pay

## 2018-04-27 ENCOUNTER — Encounter: Payer: Self-pay | Admitting: Obstetrics and Gynecology

## 2018-04-27 ENCOUNTER — Ambulatory Visit (INDEPENDENT_AMBULATORY_CARE_PROVIDER_SITE_OTHER): Payer: 59 | Admitting: Obstetrics and Gynecology

## 2018-04-27 VITALS — BP 118/70 | HR 72 | Resp 16 | Ht 63.0 in | Wt 181.0 lb

## 2018-04-27 DIAGNOSIS — R87612 Low grade squamous intraepithelial lesion on cytologic smear of cervix (LGSIL): Secondary | ICD-10-CM | POA: Diagnosis not present

## 2018-04-27 DIAGNOSIS — N870 Mild cervical dysplasia: Secondary | ICD-10-CM | POA: Diagnosis not present

## 2018-04-27 DIAGNOSIS — Z01812 Encounter for preprocedural laboratory examination: Secondary | ICD-10-CM

## 2018-04-27 DIAGNOSIS — R87810 Cervical high risk human papillomavirus (HPV) DNA test positive: Secondary | ICD-10-CM

## 2018-04-27 LAB — POCT URINE PREGNANCY: Preg Test, Ur: NEGATIVE

## 2018-04-27 NOTE — Progress Notes (Signed)
  Subjective:     Patient ID: Megan Davila, female   DOB: 21-Dec-1965, 52 y.o.   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    Review of Systems  LMP: 04/13/18 Contraception: Tubal ligation UPT: negative     Objective:   Physical Exam  Genitourinary:      Colposcopy Consent for procedure.  3% acetic acid placed in vagina and white light and green light filter used.  Unsatisfactory colposcopy.  Pin point opening of cervix with small amount of brown blood mucous from the os.  Lugol's placed.  No decreased uptake around the os.  Biopsy of peripheral cervix at 8, 12, and 1 of raised verrucous lesions that did not take up Lugol's solution.     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  Positive HR HPV.  Hx CIN III.  Status post conization and then later LEEP.  Cervical stenosis.  Status post BTL.    Plan:     FU biopsy results.  Post colposcopy instructions given.  We discuss the need for a LEEP procedure with ECC.  This may need to be combined with laser to vaginal lesions, depending on the results of her colposcopy.      After visit summary to patient.

## 2018-04-27 NOTE — Patient Instructions (Addendum)
Colposcopy, Care After This sheet gives you information about how to care for yourself after your procedure. Your doctor may also give you more specific instructions. If you have problems or questions, contact your doctor. What can I expect after the procedure? If you did not have a tissue sample removed (did not have a biopsy), you may only have some spotting for a few days. You can go back to your normal activities. If you had a tissue sample removed, it is common to have:  Soreness and pain. This may last for a few days.  Light-headedness.  Mild bleeding from your vagina or dark-colored, grainy discharge from your vagina. This may last for a few days. You may need to wear a sanitary pad.  Spotting for at least 48 hours after the procedure.  Follow these instructions at home:  Take over-the-counter and prescription medicines only as told by your doctor. Ask your doctor what medicines you can start taking again. This is very important if you take blood-thinning medicine.  Do not drive or use heavy machinery while taking prescription pain medicine.  For 3 days, or as long as your doctor tells you, avoid: ? Douching. ? Using tampons. ? Having sex.  If you use birth control (contraception), keep using it.  Limit activity for the first day after the procedure. Ask your doctor what activities are safe for you.  It is up to you to get the results of your procedure. Ask your doctor when your results will be ready.  Keep all follow-up visits as told by your doctor. This is important. Contact a doctor if:  You get a skin rash. Get help right away if:  You are bleeding a lot from your vagina. It is a lot of bleeding if you are using more than one pad an hour for 2 hours in a row.  You have clumps of blood (blood clots) coming from your vagina.  You have a fever.  You have chills  You have pain in your lower belly (pelvic area).  You have signs of infection, such as vaginal  discharge that is: ? Different than usual. ? Yellow. ? Bad-smelling.  You have very pain or cramps in your lower belly that do not get better with medicine.  You feel light-headed.  You feel dizzy.  You pass out (faint). Summary  If you did not have a tissue sample removed (did not have a biopsy), you may only have some spotting for a few days. You can go back to your normal activities.  If you had a tissue sample removed, it is common to have mild pain and spotting for 48 hours.  For 3 days, or as long as your doctor tells you, avoid douching, using tampons and having sex.  Get help right away if you have bleeding, very bad pain, or signs of infection. This information is not intended to replace advice given to you by your health care provider. Make sure you discuss any questions you have with your health care provider. Document Released: 05/09/2008 Document Revised: 08/10/2016 Document Reviewed: 08/10/2016 Elsevier Interactive Patient Education  2018 [employer].  Cervical Conization Cervical conization (cone biopsy) is a procedure in which a cone-shaped portion of the cervix is cut out so that it can be examined under a microscope. The procedure is done to check for cancer cells or cells that might turn into cancer (precancerous cells). You may have this procedure if:  You have abnormal bleeding from your cervix.  You  had an abnormal Pap test.  Something abnormal was seen on your cervix during an exam.  This procedure is performed in either a health care provider's office or in an operating room. Tell a health care provider about:  Any allergies you have.  All medicines you are taking, including vitamins, herbs, eye drops, creams, and over-the-counter medicines.  Any problems you or family members have had with the use of anesthetic medicines.  Any blood disorders you have.  Any surgeries you have had.  Any medical conditions you have.  Your smoking  habits.  When you normally have your period.  Whether you are pregnant or may be pregnant. What are the risks? Generally, this is a safe procedure. However, problems may occur, including:  Heavy bleeding for several days or weeks after the procedure.  Allergic reactions to medicines or dyes.  Increased risk of preterm labor in future pregnancies.  Infection (rare).  Damage to the cervix or other structures or organs (rare).  What happens before the procedure? Staying hydrated Follow instructions from your health care provider about hydration, which may include:  Up to 2 hours before the procedure - you may continue to drink clear liquids, such as water, clear fruit juice, black coffee, and plain tea.  Eating and drinking restrictions Follow instructions from your health care provider about eating and drinking, which may include:  8 hours before the procedure - stop eating heavy meals or foods such as meat, fried foods, or fatty foods.  6 hours before the procedure - stop eating light meals or foods, such as toast or cereal.  6 hours before the procedure - stop drinking milk or drinks that contain milk.  2 hours before the procedure - stop drinking clear liquids.  General instructions  Do not douche, have sex, use tampons, or use any vaginal medicines before the procedure as told by your health care provider.  You may be asked to empty your bladder and bowel right before the procedure.  Ask your health care provider about: ? Changing or stopping your normal medicines. This is important if you take diabetes medicines or blood thinners. ? Taking medicines such as aspirin and ibuprofen. These medicines can thin your blood. Do not take these medicines before your procedure if your doctor tells you not to.  Plan to have someone take you home from the hospital or clinic. What happens during the procedure?  To reduce your risk of infection: ? Your health care team will wash  or sanitize their hands. ? Your skin will be washed with soap. ? Hair may be removed from the surgical area.  You will undress from the waist down and be given a gown to wear.  You will lie on an examining table and put your feet in stirrups.  An IV tube will be inserted into one of your veins.  You will be given one or more of the following: ? A medicine to help you relax (sedative). ? A medicine to numb the area (local anesthetic). ? A medicine to make you fall asleep (general anesthetic). ? A medicine that numbs the cervix (cervical block).  A lubricated device called a speculum will be inserted into your vagina. It will be used to spread open the walls of the vagina so your health care provider can see the inside of the vagina and cervix better.  An instrument that has a magnifying lens and a light (colposcope) will let your health care provider examine the cervix more closely.  Your health care provider will apply a solution to your cervix. This turns abnormal areas a pale color.  A tissue sample will be removed from the cervix using one of the following methods: ? The cold knife method. In this method, the tissue is cut out with a knife (scalpel). ? The loop electrosurgical excision procedure (LEEP) method. In this method, the tissue is cut out with a thin wire that can burn (cauterize) the tissue with an electrical current. ? Laser treatment method. In this method, the tissue is cut out and then cauterized with a laser beam to prevent bleeding.  Your health care provider will apply a paste over the biopsy areas to help control bleeding.  The tissue sample will be examined under a microscope. The procedure may vary among health care providers and hospitals. What happens after the procedure?  Your blood pressure, heart rate, breathing rate, and blood oxygen level will be monitored often until the medicines you were given have worn off.  If you were given a local anesthetic,  you will rest at the clinic or hospital until you are stable and feel ready to go home.  If you were given a general anesthetic, you may be monitored for a longer period of time.  You may have some cramping.  You may have bloody discharge or light to moderate bleeding.  You may have dark discharge coming from your vagina. This is from the paste used on the cervix to prevent bleeding. Summary  Cervical conization is a procedure in which a cone-shaped portion of the cervix is cut out so that it can be examined under a microscope.  The procedure is done to check for cancer cells or cells that might turn into cancer (precancerous cells). This information is not intended to replace advice given to you by your health care provider. Make sure you discuss any questions you have with your health care provider. Document Released: 08/31/2005 Document Revised: 11/23/2016 Document Reviewed: 11/23/2016 Elsevier Interactive Patient Education  2017 [employer].

## 2018-05-04 ENCOUNTER — Telehealth: Payer: Self-pay | Admitting: Obstetrics and Gynecology

## 2018-05-04 DIAGNOSIS — R87612 Low grade squamous intraepithelial lesion on cytologic smear of cervix (LGSIL): Secondary | ICD-10-CM

## 2018-05-04 NOTE — Telephone Encounter (Signed)
Please share results with patient showing LGSIL.  I am recommending a LEEP conization and ECC in the office setting.   This will need to go to precert and scheduling.

## 2018-05-04 NOTE — Telephone Encounter (Signed)
Results to Dr.Silva for review.

## 2018-05-04 NOTE — Telephone Encounter (Signed)
Spoke with patient. Advised of results as seen below from [place]. Patient verbalizes understanding. All questions answered. Patient has concerns about pain after and is requesting rx for pain to take following the procedure. Advised will review with Dr.Silva. Patient would like this done as soon as possible. Requesting to have this done on Tuesday 05/08/2018. Advised will review with Dr.Silva and return call. Patient is agreeable.  Dr.Silva, this Tuesday is your half day. Please advise on scheduling and medication request.

## 2018-05-04 NOTE — Telephone Encounter (Signed)
Patient called requesting results from procedure last Friday. Please call her cell phone first [phone], work is her secondary number [phone] (please do not leave message).

## 2018-05-05 NOTE — Telephone Encounter (Signed)
12:00 is ok for my schedule for 05/08/18.  Please send to precert.  Is it antianxiety medication she is asking for to take prior to the procedure or pain medication post procedure? If she is requesting to take antianxiety medication, I need to do her consent prior to taking the medication and she will need a driver to take her home afterward.

## 2018-05-07 ENCOUNTER — Telehealth: Payer: Self-pay | Admitting: Obstetrics and Gynecology

## 2018-05-07 NOTE — Telephone Encounter (Signed)
Spoke with patient regarding benefit for scheduled procedure. Patient understood and agreeable. Patient ready to schedule. Patient scheduled 05/08/18 with Dr Quincy Simmonds. Patient  aware of arrival date and time.

## 2018-05-07 NOTE — Telephone Encounter (Signed)
Spoke with patient. Advised of message as seen below from [place]. Patient verbalizes understanding. Appointment scheduled for 05/08/2018 at 12 pm. Patient is agreeable to date and time. Order placed for precert.  Routing to provider for final review. Patient agreeable to disposition. Will close encounter.

## 2018-05-07 NOTE — Telephone Encounter (Signed)
Call placed to patient to review benefits for a scheduled procedure. Left voicemail message requesting a return call.

## 2018-05-07 NOTE — Telephone Encounter (Signed)
I will need to discuss pain medication with her at the visit.  Narcotics are not necessary following this procedure.

## 2018-05-07 NOTE — Telephone Encounter (Signed)
Patient is requesting pain medication for after the procedure. Advised patient may take Ibuprofen 800 mg before procedure and 8 hours after if needed. Patient is concerned about pain and requests an rx for pain to pick up following procedure in case she needs it. Does not want medication before as she does not wish to have a driver to and from the office. Advised I would review with you.

## 2018-05-08 ENCOUNTER — Encounter: Payer: Self-pay | Admitting: Obstetrics and Gynecology

## 2018-05-08 ENCOUNTER — Other Ambulatory Visit: Payer: Self-pay

## 2018-05-08 ENCOUNTER — Ambulatory Visit: Payer: 59 | Admitting: Obstetrics and Gynecology

## 2018-05-08 VITALS — BP 118/80 | HR 84 | Resp 16 | Ht 63.0 in | Wt 178.0 lb

## 2018-05-08 DIAGNOSIS — Z01812 Encounter for preprocedural laboratory examination: Secondary | ICD-10-CM

## 2018-05-08 DIAGNOSIS — N882 Stricture and stenosis of cervix uteri: Secondary | ICD-10-CM | POA: Diagnosis not present

## 2018-05-08 DIAGNOSIS — N72 Inflammatory disease of cervix uteri: Secondary | ICD-10-CM | POA: Diagnosis not present

## 2018-05-08 DIAGNOSIS — R87810 Cervical high risk human papillomavirus (HPV) DNA test positive: Secondary | ICD-10-CM | POA: Diagnosis not present

## 2018-05-08 DIAGNOSIS — R87612 Low grade squamous intraepithelial lesion on cytologic smear of cervix (LGSIL): Secondary | ICD-10-CM

## 2018-05-08 LAB — POCT URINE PREGNANCY: Preg Test, Ur: NEGATIVE

## 2018-05-08 NOTE — Progress Notes (Signed)
  Subjective:     Patient ID: Megan Davila, female   DOB: May 15, 1966, 52 y.o.   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   Review of Systems  Constitutional: Negative.   HENT: Negative.   Eyes: Negative.   Respiratory: Negative.   Cardiovascular: Negative.   Gastrointestinal: Negative.   Endocrine: Negative.   Genitourinary: Negative.   Musculoskeletal: Negative.   Skin: Negative.   Allergic/Immunologic: Negative.   Neurological: Negative.   Hematological: Negative.   Psychiatric/Behavioral: Negative.     LMP: 04/13/18 Contraception: tubal ligation UPT: negative      Objective:   Physical Exam  Genitourinary:      LEEP  Consent for procedure.  An insulated speculum was placed in the vagina. 3% acetic acid was used on the cervix and colposcopy was performed.  Prior biopsy sites were noted.  The colposcopy was unsatisfactory.  Small clot was seen inside the cervical canal. Lugol's was used to stain the cervix and the transformation zone was very large and measured around 3.5 cm in diameter and spread almost to the vagina.  The cervix was injected with 20 cc 1% lidocaine with epinepherine 1:100,000. Lot number [id number], expiration 10/19.  A LEEP was performed of the central cervix with a cutting setting of 50 watts.  The specimen was later marked with a pink pin at the 12:00 position and sent to pathology. An endocervical pass was performed also with a cutting setting of 50 watts.  This specimen was later marked with a green pin at the 12:00 position and sent to pathology.  The ECC was performed with the Kevorkian curette and sent to pathology.    The cervix was coagulated with the cautery ball on a  setting of 50 watts.  Hemostasis was good.  Monsels solution was placed over the LEEP bed.   There were no complications to the procedure.  All needle, instrument, and sponge counts were correct.      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  Positive HR HPV.  LGSIL on cervical biopsies.  Unsatisfactory colposcopy.  Large treatment area suggested by Lugol's solution today which is not consistent with what a LEEP can do.  This would essentially remove the entire cervix.  Hx prior cervical conization for CIN III and prior LEEP for CIN I.    Plan:     FU biopsies.  I discussed some of the limits of what can be accomplished today and that we needed to focus on the endocervical canal.  This is the patient's third excisional procedure and second procedure due to an unsatisfactory colposcopy.  I mentioned that hysterectomy may be a reasonable option for her but that this would not remove the HPV virus which could cause vaginal dysplasia.   After visit summary to patient.

## 2018-05-08 NOTE — Patient Instructions (Signed)

## 2018-05-16 ENCOUNTER — Telehealth: Payer: Self-pay | Admitting: Obstetrics and Gynecology

## 2018-05-16 NOTE — Telephone Encounter (Signed)
Patient calling to request results from procedure.

## 2018-05-16 NOTE — Telephone Encounter (Signed)
Please see result note and plan.

## 2018-05-16 NOTE — Telephone Encounter (Signed)
Routing to Dr.Silva for review of pathology results from 05/08/2018.

## 2018-05-16 NOTE — Telephone Encounter (Signed)
Call to patient. Advised message has been received and will review with provider. Will call her back as soon as possible tomorrow.

## 2018-05-16 NOTE — Telephone Encounter (Signed)
Patient calling to request results.

## 2018-05-17 NOTE — Telephone Encounter (Signed)
Call to patient. Reviewed results and recommendations as directed by Dr Quincy Simmonds. Patient has multiple questions but declined office visit to discuss. Will discuss further at 1 month recheck. Appointment scheduled for 06-11-18. 3 month pap appointment scheduled for 08-13-18. Recall entered for 08-19-18.  Advised may call back to me directly if has addiitonal concerns.   Encounter closed.

## 2018-05-17 NOTE — Telephone Encounter (Signed)
-----   Message from Nunzio Cobbs, MD sent at 05/16/2018  6:35 PM EDT ----- Please report LEEP results to the patient.  She has normal cells with no sign of atypia, dysplasia or malignancy in either the LEEP or the ECC.  Her recent colposcopy biopsies showed LGSIL of the exocervix and she had an unsatisfactory colposcopy.  Her recent pap showed LGSIL and possible HGSIL with positive HR HPV.   At the time of her LEEP procedure, it looked like she had signs of more expansive cervical dysplasia  when I applied Lugol's solution.  I told her I had some concerns about this and whether or not she would need an additional procedure such as hysterectomy to make sure the dysplasia was adequately treated. When I later went back to review her recent colposcopy, the Lugol's did not demonstrate this change.  I now question if the recent colposcopy and medications applied to the cervix were responsible for the difference in what I saw over such a short period of time.   What I recommend for her is the following:   LEEP recheck in 1 month and pap in 3 months.  Please place her in pap recall for September 2019.   I am not recommending hysterectomy at this time.

## 2018-05-21 ENCOUNTER — Inpatient Hospital Stay: Payer: 59 | Attending: Hematology & Oncology

## 2018-05-21 DIAGNOSIS — R42 Dizziness and giddiness: Secondary | ICD-10-CM | POA: Insufficient documentation

## 2018-05-21 DIAGNOSIS — R5383 Other fatigue: Secondary | ICD-10-CM | POA: Insufficient documentation

## 2018-05-21 DIAGNOSIS — D500 Iron deficiency anemia secondary to blood loss (chronic): Secondary | ICD-10-CM | POA: Diagnosis not present

## 2018-05-21 DIAGNOSIS — Z903 Acquired absence of stomach [part of]: Secondary | ICD-10-CM

## 2018-05-21 DIAGNOSIS — D510 Vitamin B12 deficiency anemia due to intrinsic factor deficiency: Secondary | ICD-10-CM | POA: Diagnosis not present

## 2018-05-21 DIAGNOSIS — K912 Postsurgical malabsorption, not elsewhere classified: Secondary | ICD-10-CM

## 2018-05-21 DIAGNOSIS — D508 Other iron deficiency anemias: Secondary | ICD-10-CM

## 2018-05-21 DIAGNOSIS — N920 Excessive and frequent menstruation with regular cycle: Secondary | ICD-10-CM | POA: Diagnosis not present

## 2018-05-21 LAB — IRON AND TIBC
IRON: 43 ug/dL (ref 41–142)
Saturation Ratios: 19 % — ABNORMAL LOW (ref 21–57)
TIBC: 223 ug/dL — ABNORMAL LOW (ref 236–444)
UIBC: 180 ug/dL

## 2018-05-21 LAB — CMP (CANCER CENTER ONLY)
ALBUMIN: 3.9 g/dL (ref 3.5–5.0)
ALT: 11 U/L (ref 0–55)
ANION GAP: 10 (ref 3–11)
AST: 14 U/L (ref 5–34)
Alkaline Phosphatase: 54 U/L (ref 40–150)
BUN: 14 mg/dL (ref 7–26)
CALCIUM: 9.6 mg/dL (ref 8.4–10.4)
CHLORIDE: 103 mmol/L (ref 98–109)
CO2: 24 mmol/L (ref 22–29)
Creatinine: 0.89 mg/dL (ref 0.60–1.10)
GFR, Estimated: >60 mL/min (ref >60)
GLUCOSE: 99 mg/dL (ref 70–140)
Potassium: 3.5 mmol/L (ref 3.5–5.1)
SODIUM: 137 mmol/L (ref 136–145)
Total Bilirubin: 0.3 mg/dL (ref 0.2–1.2)
Total Protein: 6.8 g/dL (ref 6.4–8.3)

## 2018-05-21 LAB — CBC WITH DIFFERENTIAL (CANCER CENTER ONLY)
Basophils Absolute: 0 10*3/uL (ref 0.0–0.1)
Basophils Relative: 0 %
Eosinophils Absolute: 0.1 10*3/uL (ref 0.0–0.5)
Eosinophils Relative: 1 %
HEMATOCRIT: 43.8 % (ref 34.8–46.6)
HEMOGLOBIN: 14.5 g/dL (ref 11.6–15.9)
LYMPHS ABS: 2.3 10*3/uL (ref 0.9–3.3)
Lymphocytes Relative: 35 %
MCH: 32.4 pg (ref 25.1–34.0)
MCHC: 33.1 g/dL (ref 31.5–36.0)
MCV: 97.8 fL (ref 79.5–101.0)
MONO ABS: 0.6 10*3/uL (ref 0.1–0.9)
MONOS PCT: 9 %
NEUTROS PCT: 55 %
Neutro Abs: 3.7 10*3/uL (ref 1.5–6.5)
Platelet Count: 245 10*3/uL (ref 145–400)
RBC: 4.48 MIL/uL (ref 3.70–5.45)
RDW: 12.9 % (ref 11.2–14.5)
WBC Count: 6.7 10*3/uL (ref 3.9–10.3)

## 2018-05-21 LAB — FERRITIN: Ferritin: 316 ng/mL — ABNORMAL HIGH (ref 9–269)

## 2018-05-22 ENCOUNTER — Other Ambulatory Visit: Payer: Self-pay

## 2018-05-22 ENCOUNTER — Inpatient Hospital Stay: Payer: 59

## 2018-05-22 ENCOUNTER — Inpatient Hospital Stay (HOSPITAL_BASED_OUTPATIENT_CLINIC_OR_DEPARTMENT_OTHER): Payer: 59 | Admitting: Family

## 2018-05-22 VITALS — BP 140/94 | HR 99 | Temp 98.3°F | Resp 18 | Wt 175.0 lb

## 2018-05-22 DIAGNOSIS — K912 Postsurgical malabsorption, not elsewhere classified: Secondary | ICD-10-CM

## 2018-05-22 DIAGNOSIS — D500 Iron deficiency anemia secondary to blood loss (chronic): Secondary | ICD-10-CM

## 2018-05-22 DIAGNOSIS — N920 Excessive and frequent menstruation with regular cycle: Secondary | ICD-10-CM

## 2018-05-22 DIAGNOSIS — D510 Vitamin B12 deficiency anemia due to intrinsic factor deficiency: Secondary | ICD-10-CM

## 2018-05-22 DIAGNOSIS — R5383 Other fatigue: Secondary | ICD-10-CM

## 2018-05-22 DIAGNOSIS — Z903 Acquired absence of stomach [part of]: Principal | ICD-10-CM

## 2018-05-22 DIAGNOSIS — D508 Other iron deficiency anemias: Secondary | ICD-10-CM

## 2018-05-22 DIAGNOSIS — R42 Dizziness and giddiness: Secondary | ICD-10-CM

## 2018-05-22 MED ORDER — SODIUM CHLORIDE 0.9 % IV SOLN
510.0000 mg | Freq: Once | INTRAVENOUS | Status: AC
  Administered 2018-05-22: 510 mg via INTRAVENOUS
  Filled 2018-05-22: qty 17

## 2018-05-22 MED ORDER — SODIUM CHLORIDE 0.9 % IV SOLN
Freq: Once | INTRAVENOUS | Status: AC
  Administered 2018-05-22: 14:00:00 via INTRAVENOUS

## 2018-05-22 NOTE — Patient Instructions (Signed)

## 2018-05-22 NOTE — Progress Notes (Signed)
Hematology and Oncology Follow Up Visit  Megan Davila [id number] March 15, 1966 52 y.o. 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 She is symptomatic with fatigue, palpitations at times, SOB with stairs, dizziness and brain fog.  No syncopal episodes. She states that she has had a couple falls but thankfully was not injured.  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  Her appetite is down but she is staying well hydrated. Her weight is stable.  She states that she  No fever, chills, cough, rash, chest pain, abdominal pain or changes in bowel or bladder habits.  No swelling, tenderness, numbness or tingling in her extremities. No c/o pain. One of her medications is giving her dry mouth and she is using a mouth rinse regularly to help with this.   She is working out at [employer] for exercise and trying to lose more weight.   ECOG Performance Status: 1 - Symptomatic but completely ambulatory  Medications:  Allergies as of 05/22/2018      Reactions   Naproxen Anaphylaxis   Monistat [miconazole] Other (See Comments)   "burning"   Sulfa Antibiotics Hives   Tioconazole Itching      Medication List        Accurate as of 05/22/18  1:20 PM. Always use your most recent med list.          ALPRAZolam 1 MG tablet Commonly known as:  XANAX Take 1 mg by mouth 2 (two) times daily as needed. Anxiety   cyanocobalamin 1000 MCG/ML injection Commonly known as:  (VITAMIN B-12) Monthly injection   DEXILANT 60 MG capsule Generic drug:   dexlansoprazole Take 60 mg by mouth daily.   divalproex 500 MG DR tablet Commonly known as:  DEPAKOTE Take 1 tablet (500 mg total) by mouth 2 (two) times daily.   eletriptan 40 MG tablet Commonly known as:  RELPAX Take 1 tablet (40 mg total) by mouth as needed for migraine or headache. May repeat in 2 hours if needed. Max 2 tabs per day or 8 tabs per month.   ergocalciferol 50000 units capsule Commonly known as:  VITAMIN D2 Take 50,000 Units by mouth. She is taking daily M-F - pr pt   fexofenadine 180 MG tablet Commonly known as:  ALLEGRA Take 180 mg by mouth daily as needed. allergies   fluticasone 50 MCG/ACT nasal spray Commonly known as:  FLONASE Place 1 spray into both nostrils as needed.   imipramine 50 MG tablet Commonly known as:  TOFRANIL Take 2 tablets by mouth daily.   lansoprazole 30 MG capsule Commonly known as:  PREVACID Take 30 mg by mouth daily as needed. Acid reflux   multivitamin with minerals tablet Take 1 tablet by mouth daily.   nystatin powder Commonly known as:  nystatin Apply topically 2 (two) times daily.   Polyethyl Glycol-Propyl Glycol 0.4-0.3 % Soln Place 1 drop into both eyes as needed.   ranitidine 150 MG capsule Commonly known as:  ZANTAC   thiamine 50 MG tablet Commonly known as:  VITAMIN B-1 daily.   tiZANidine 4 MG tablet Commonly known as:  ZANAFLEX Take 1 tablet by mouth daily.   venlafaxine XR 150 MG 24 hr capsule Commonly known as:  EFFEXOR-XR Take 1 capsule by mouth daily.   zonisamide 100 MG capsule Commonly known as:  ZONEGRAN Take 1 capsule (100 mg total) by mouth 2 (two) times daily.       Allergies:  Allergies  Allergen Reactions  . Naproxen Anaphylaxis  . Monistat [Miconazole] Other (See Comments)    "burning"   . Sulfa Antibiotics Hives  . Tioconazole Itching    Past Medical History, Surgical history, Social history, and Family History were reviewed and updated.  Review of Systems: All other 10  point review of systems is negative.   Physical Exam:  vitals were not taken for this visit.   Wt Readings from Last 3 Encounters:  05/08/18 178 lb (80.7 kg)  04/27/18 181 lb (82.1 kg)  04/23/18 184 lb 9.6 oz (83.7 kg)    Ocular: Sclerae unicteric, pupils equal, round and reactive to light Ear-nose-throat: Oropharynx clear, dentition fair Lymphatic: No cervical, supraclavicular or axillary adenopathy Lungs no rales or rhonchi, good excursion bilaterally Heart regular rate and rhythm, no murmur appreciated Abd soft, nontender, positive bowel sounds, no liver or spleen tip palpated on exam, no fluid wave  MSK no focal spinal tenderness, no joint edema Neuro: non-focal, well-oriented, appropriate affect Breasts: Deferred   Lab Results  Component Value Date   WBC 6.7 05/21/2018   HGB 14.5 05/21/2018   HCT 43.8 05/21/2018   MCV 97.8 05/21/2018   PLT 245 05/21/2018   Lab Results  Component Value Date   FERRITIN 316 (H) 05/21/2018   IRON 43 05/21/2018   TIBC 223 (L) 05/21/2018   UIBC 180 05/21/2018   IRONPCTSAT 19 (L) 05/21/2018   Lab Results  Component Value Date   RETICCTPCT 1.0 03/21/2018   RBC 4.48 05/21/2018   RETICCTABS 49.62 09/07/2016   No results found for: KPAFRELGTCHN, LAMBDASER, KAPLAMBRATIO No results found for: IGGSERUM, IGA, IGMSERUM No results found for: Odetta Pink, SPEI   Chemistry      Component Value Date/Time   NA 137 05/21/2018 1336   NA 137 10/18/2017 1630   NA 140 09/07/2016 1327   K 3.5 05/21/2018 1336   K 3.4 (L) 09/07/2016 1327   CL 103 05/21/2018 1336   CO2 24 05/21/2018 1336   CO2 22 09/07/2016 1327   BUN 14 05/21/2018 1336   BUN 18 10/18/2017 1630   BUN 13.3 09/07/2016 1327   CREATININE 0.89 05/21/2018 1336   CREATININE 0.70 12/29/2016 1652   CREATININE 0.8 09/07/2016 1327      Component Value Date/Time   CALCIUM 9.6 05/21/2018 1336   CALCIUM 8.2 (L) 09/07/2016 1327    ALKPHOS 54 05/21/2018 1336   ALKPHOS 54 09/07/2016 1327   AST 14 05/21/2018 1336   AST 11 09/07/2016 1327   ALT 11 05/21/2018 1336   ALT <9 09/07/2016 1327   BILITOT 0.3 05/21/2018 1336   BILITOT 0.41 09/07/2016 1327      Impression and Plan: Megan Davila is a very pleasant 52 yo caucasian female with iron deficiency secondary to malabsorption after gastric bypass. She is symptomatic with fatigued, dizziness, palpitations and brain fog.  Iron saturation is down at 19% so we will give her IV iron today.  We will plan to see her back in another 8 weeks for follow-up.  She  will contact our office with any questions or concerns. We can certainly see her sooner if need be.   Megan Peace, NP 6/18/20191:20 PM

## 2018-05-24 ENCOUNTER — Other Ambulatory Visit: Payer: 59

## 2018-05-24 ENCOUNTER — Ambulatory Visit: Payer: 59 | Admitting: Family

## 2018-05-29 ENCOUNTER — Emergency Department (HOSPITAL_COMMUNITY): Payer: 59

## 2018-05-29 ENCOUNTER — Emergency Department (HOSPITAL_COMMUNITY)
Admission: EM | Admit: 2018-05-29 | Discharge: 2018-05-29 | Disposition: A | Discharge status: Home / Self Care | Payer: 59 | Attending: Emergency Medicine | Admitting: Emergency Medicine

## 2018-05-29 ENCOUNTER — Other Ambulatory Visit: Payer: Self-pay

## 2018-05-29 ENCOUNTER — Encounter (HOSPITAL_COMMUNITY): Payer: Self-pay

## 2018-05-29 ENCOUNTER — Telehealth: Payer: Self-pay | Admitting: *Deleted

## 2018-05-29 DIAGNOSIS — R109 Unspecified abdominal pain: Secondary | ICD-10-CM | POA: Diagnosis not present

## 2018-05-29 DIAGNOSIS — R1032 Left lower quadrant pain: Secondary | ICD-10-CM | POA: Diagnosis not present

## 2018-05-29 DIAGNOSIS — R072 Precordial pain: Secondary | ICD-10-CM | POA: Diagnosis not present

## 2018-05-29 DIAGNOSIS — Z79899 Other long term (current) drug therapy: Secondary | ICD-10-CM | POA: Diagnosis not present

## 2018-05-29 DIAGNOSIS — R1013 Epigastric pain: Secondary | ICD-10-CM | POA: Diagnosis not present

## 2018-05-29 DIAGNOSIS — R9431 Abnormal electrocardiogram [ECG] [EKG]: Secondary | ICD-10-CM | POA: Diagnosis not present

## 2018-05-29 LAB — COMPREHENSIVE METABOLIC PANEL
ALK PHOS: 43 U/L (ref 38–126)
ALT: 33 U/L (ref 0–44)
AST: 27 U/L (ref 15–41)
Albumin: 3.6 g/dL (ref 3.5–5.0)
Anion gap: 9 (ref 5–15)
BUN: 5 mg/dL — ABNORMAL LOW (ref 6–20)
CALCIUM: 8.8 mg/dL — AB (ref 8.9–10.3)
CHLORIDE: 108 mmol/L (ref 98–111)
CO2: 23 mmol/L (ref 22–32)
CREATININE: 0.88 mg/dL (ref 0.44–1.00)
GFR calc non Af Amer: >60 mL/min (ref >60)
GLUCOSE: 115 mg/dL — AB (ref 70–99)
Potassium: 3.6 mmol/L (ref 3.5–5.1)
SODIUM: 140 mmol/L (ref 135–145)
Total Bilirubin: 0.9 mg/dL (ref 0.3–1.2)
Total Protein: 6.6 g/dL (ref 6.5–8.1)

## 2018-05-29 LAB — URINALYSIS, ROUTINE W REFLEX MICROSCOPIC
Bilirubin Urine: NEGATIVE
GLUCOSE, UA: NEGATIVE mg/dL
Ketones, ur: NEGATIVE mg/dL
Nitrite: NEGATIVE
PH: 5 (ref 5.0–8.0)
Protein, ur: 30 mg/dL — AB
Specific Gravity, Urine: 1.018 (ref 1.005–1.030)

## 2018-05-29 LAB — I-STAT BETA HCG BLOOD, ED (MC, WL, AP ONLY): I-stat hCG, quantitative: <5 m[IU]/mL (ref <5)

## 2018-05-29 LAB — CBC
HCT: 42.8 % (ref 36.0–46.0)
Hemoglobin: 13.7 g/dL (ref 12.0–15.0)
MCH: 32.2 pg (ref 26.0–34.0)
MCHC: 32 g/dL (ref 30.0–36.0)
MCV: 100.7 fL — AB (ref 78.0–100.0)
PLATELETS: 253 10*3/uL (ref 150–400)
RBC: 4.25 MIL/uL (ref 3.87–5.11)
RDW: 12.8 % (ref 11.5–15.5)
WBC: 7.4 10*3/uL (ref 4.0–10.5)

## 2018-05-29 LAB — I-STAT TROPONIN, ED
TROPONIN I, POC: 0 ng/mL (ref 0.00–0.08)
Troponin i, poc: 0 ng/mL (ref 0.00–0.08)

## 2018-05-29 LAB — WET PREP, GENITAL
Clue Cells Wet Prep HPF POC: NONE SEEN
Sperm: NONE SEEN
Trich, Wet Prep: NONE SEEN
Yeast Wet Prep HPF POC: NONE SEEN

## 2018-05-29 LAB — LIPASE, BLOOD: LIPASE: 31 U/L (ref 11–51)

## 2018-05-29 MED ORDER — CEPHALEXIN 500 MG PO CAPS: 500.0000 mg | ORAL_CAPSULE | Freq: Two times a day (BID) | ORAL | 0 refills | Status: DC

## 2018-05-29 MED ORDER — OXYCODONE-ACETAMINOPHEN 5-325 MG PO TABS: 1.0000 | ORAL_TABLET | Freq: Four times a day (QID) | ORAL | 0 refills | Status: DC | PRN

## 2018-05-29 MED ORDER — SODIUM CHLORIDE 0.9 % IV BOLUS: 1000.0000 mL | Freq: Once | INTRAVENOUS | Status: DC

## 2018-05-29 MED ORDER — IOHEXOL 300 MG/ML  SOLN
100.0000 mL | Freq: Once | INTRAMUSCULAR | Status: AC | PRN
  Administered 2018-05-29: 100 mL via INTRAVENOUS

## 2018-05-29 MED ORDER — ONDANSETRON 4 MG PO TBDP
4.0000 mg | ORAL_TABLET | Freq: Once | ORAL | Status: AC
  Administered 2018-05-29: 4 mg via ORAL
  Filled 2018-05-29: qty 1

## 2018-05-29 MED ORDER — MORPHINE SULFATE (PF) 4 MG/ML IV SOLN
4.0000 mg | Freq: Once | INTRAVENOUS | Status: DC
  Filled 2018-05-29: qty 1

## 2018-05-29 MED ORDER — MORPHINE SULFATE (PF) 4 MG/ML IV SOLN
4.0000 mg | Freq: Once | INTRAVENOUS | Status: AC | PRN
  Administered 2018-05-29: 4 mg via INTRAMUSCULAR

## 2018-05-29 NOTE — ED Notes (Signed)
Patient transported to X-ray 

## 2018-05-29 NOTE — ED Notes (Signed)
Patient ambulatory to bathroom with steady gait at this time 

## 2018-05-29 NOTE — ED Notes (Signed)
EKG done in triage

## 2018-05-29 NOTE — ED Provider Notes (Signed)
[facility] EMERGENCY DEPARTMENT Provider Note   CSN: [id number] Arrival date & time: 05/29/18  1446     History   Chief Complaint Chief Complaint  Patient presents with  . Abdominal Pain    HPI Megan Davila is a 52 y.o. female with a hx of pernicious anemia, intestinal malabsorption following gastrectomy, gastric bypass, cholecystectomy, anxiety, depression, GERD, headache, nephrolithiasis, bowel perforation, and hypothyroidism who presents to the emergency department with multiple complaints today.   Patient states the last evening she developed a fairly quick onset pain to her left lower abdomen which lasted approximately 15 minutes in duration.  She had associated nausea and one episode of vomiting.  Pain resolved spontaneously.  She states that this morning around 9:00 she had recurrence of this discomfort.  She states this is been occurring intermittently throughout the day today.  She has had associated nausea without vomiting today.  She states that about an hour after the discomfort that she had in the belly she developed some belching followed by burning substernal chest discomfort.  She states "it felt like heartburn".  She states that the burning sensation resolved and now she is kind of uncomfortable, not necessarily painful and has moved down to the epigastric region.  She also reports that she had a tingling sensation to bilateral upper extremities described as being from the elbow distally.  The area is not numb or weak. This is improved and almost resolved at this time.  She discussed with her primary care provider who recommended that she come to the emergency department for evaluation.  States that she has felt anxious throughout the day today.  She did feel little bit lightheaded walking to the car to come to the ER, no syncopal episodes.  She does not have a history of similar symptoms.  There are no specific alleviating or aggravating factors to her  symptoms.  Denies change in vision, numbness, weakness, diarrhea, constipation, diarrhea, or fevers.  States she has had some vaginal bleeding and discharge which is not new for her, this has been ongoing with recent gynecologic procedures due to abnormal Pap smear. Denies dyspnea, leg pain/swelling, hemoptysis, recent surgery/trauma, recent long travel, hormone use, personal hx of cancer, or hx of DVT/PE. She has not had anything to eat today, minimal PO intake has had some water.    HPI  Past Medical History:  Diagnosis Date  . Abnormal Pap smear   . Anemia   . Anxiety   . Broken toe 12-22-15   middle toe right foot  . Cataract of both eyes   . Depression   . GERD (gastroesophageal reflux disease)   . Headache(784.0)   . Heart murmur   . History of kidney stones   . 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 Perforated bowel ([place])   . Pernicious anemia 06/19/2015  . PONV (postoperative nausea and vomiting)   . Thyroid disease    hypothyrodism    Patient Active Problem List   Diagnosis Date Noted  . Vitamin D deficiency 05/23/2017  . Anxiety 06/19/2015  . Anemia, iron deficiency 06/19/2015  . Pernicious anemia 06/19/2015  . 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 Protein-calorie malnutrition, severe ([place]) 04/18/2015  . Severe protein-calorie malnutrition ([place]) 04/18/2015  . 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 Bariatric surgery status 04/18/2015  . 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 GERD (gastroesophageal reflux disease) 04/17/2015  . Hypokalemia 04/16/2015  . Migraine with aura 05/21/2014  . 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 CATARACT EXTRACTION, BILATERAL Bilateral 02/2016  . CERVICAL BIOPSY  W/ LOOP ELECTRODE EXCISION  11/2013   LGSIL  . CERVIX LESION DESTRUCTION  1993   CIN III, [facility], recurrence--YAG laser    . CESAREAN SECTION  2008  . CHOLECYSTECTOMY    . GASTRIC BYPASS  2002  . HYSTEROSCOPY W/D&C  10/14/2011   Procedure: DILATATION AND CURETTAGE (D&C) /HYSTEROSCOPY;  Surgeon: Arloa Koh;  Location: [facility];  Service: Gynecology;  Laterality: Bilateral;  . LASIK    . NOSE SURGERY    . STOMACH SURGERY  04/20/2015   dilation of "connection between stomach and intestine", [facility]  . TONSILLECTOMY    . TONSILLECTOMY    . TUBAL LIGATION  2008     OB History    Gravida  1   Para  1   Term      Preterm      AB      Living  1     SAB      TAB      Ectopic      Multiple  1   Live Births               Home Medications    Prior to Admission medications   Medication Sig Start Date End Date Taking? Authorizing Provider  ALPRAZolam Duanne Moron) 1 MG tablet Take 1 mg by mouth 2 (two) times daily as needed. Anxiety      [provider]  cyanocobalamin (,VITAMIN B-12,) 1000 MCG/ML injection Monthly injection 09/03/15   [provider]  DEXILANT 60 MG capsule Take 60 mg by mouth daily. 02/25/15   [provider]  divalproex (DEPAKOTE) 500 MG DR tablet Take 1 tablet (500 mg total) by mouth 2 (two) times daily. 04/23/18   Penumalli, Earlean Polka, MD  eletriptan (RELPAX) 40 MG tablet Take 1 tablet (40 mg total) by mouth as needed for migraine or headache. May repeat in 2 hours if needed. Max 2 tabs per day or 8 tabs per month. 04/23/18   Penumalli, Earlean Polka, MD  ergocalciferol (VITAMIN D2) 50000 UNITS capsule Take 50,000 Units by mouth. She is taking daily M-F - pr pt    [provider]  fexofenadine (ALLEGRA) 180 MG tablet Take 180 mg by mouth daily as needed. allergies     [provider]  fluticasone (FLONASE) 50 MCG/ACT nasal spray Place 1 spray into both nostrils as needed. 01/17/17   [provider]  imipramine (TOFRANIL) 50 MG tablet Take 2 tablets by mouth daily. 03/28/18   [provider]  lansoprazole (PREVACID) 30 MG  capsule Take 30 mg by mouth daily as needed. Acid reflux      [provider]  Multiple Vitamins-Minerals (MULTIVITAMIN WITH MINERALS) tablet Take 1 tablet by mouth daily.      [provider]  nystatin (NYSTATIN) powder Apply topically 2 (two) times daily. 07/08/16   [place], Holli Humbles, NP  Polyethyl Glycol-Propyl Glycol 0.4-0.3 % SOLN Place 1 drop into both eyes as needed.    [provider]  ranitidine (ZANTAC) 150 MG capsule  05/31/15   [provider]  thiamine (VITAMIN B-1) 50 MG tablet daily. 08/07/15   [provider]  tiZANidine (ZANAFLEX) 4 MG tablet Take 1 tablet by mouth daily. 01/28/18   [provider]  venlafaxine XR (EFFEXOR-XR) 150 MG 24 hr capsule Take 1 capsule  by mouth daily. 01/28/18   [provider]  zonisamide (ZONEGRAN) 100 MG capsule Take 1 capsule (100 mg total) by mouth 2 (two) times daily. 04/23/18   Penumalli, Earlean Polka, MD    Family History Family History  Problem Relation Age of Onset  . Diabetes Mother   . Heart disease Father   . Social phobia Father   . Psychiatric Illness Father   . Hypertension Father   . Stroke Father   . Dementia Father   . Heart disease Unknown   . Diabetes Unknown   . Stroke Unknown   . Hypertension Maternal Grandmother   . Thyroid disease Maternal Grandmother   . Hypertension Paternal Grandmother   . Stroke Paternal Grandfather   . Breast cancer Neg Hx     Social History Social History   Tobacco Use  . Smoking status: Never Smoker  . Smokeless tobacco: Never Used  Substance Use Topics  . Alcohol use: Yes    Alcohol/week: 0.0 oz    Comment: Once a month-rarely  . Drug use: No     Allergies   Naproxen; Monistat [miconazole]; Sulfa antibiotics; and Tioconazole   Review of Systems Review of Systems  Constitutional: Negative for chills and fever.  Respiratory: Negative for cough and shortness of breath.   Cardiovascular: Positive for chest pain. Negative  for leg swelling.  Gastrointestinal: Positive for abdominal pain, nausea and vomiting (1 episode last night). Negative for blood in stool, constipation and diarrhea.  Genitourinary: Positive for vaginal bleeding (s/p procedures, unchanged) and vaginal discharge (s/p procedures unchanged). Negative for dysuria, hematuria and vaginal pain.  Neurological: Positive for light-headedness (brief while walking to the car resolved). Negative for dizziness, syncope, weakness, numbness and headaches.       Positive for paresthesias to upper extremities, improved  Psychiatric/Behavioral: Negative for suicidal ideas. The patient is nervous/anxious.   All other systems reviewed and are negative.    Physical Exam Updated Vital Signs BP (!) 139/105   Pulse (!) 118   Temp 98.9 F (37.2 C) (Oral)   Resp 18   Ht 5' 3.5" (1.613 m)   Wt 79.4 kg (175 lb)   LMP 04/15/2018 (Approximate)   SpO2 99%   BMI 30.51 kg/m   Physical Exam  Constitutional: She appears well-developed and well-nourished. No distress.  HENT:  Head: Normocephalic and atraumatic.  Eyes: Conjunctivae are normal. Right eye exhibits no discharge. Left eye exhibits no discharge.  Cardiovascular: Normal rate and regular rhythm.  No murmur heard. Pulses:      Radial pulses are 2+ on the right side, and 2+ on the left side.       Dorsalis pedis pulses are 2+ on the right side, and 2+ on the left side.  Pulmonary/Chest: Breath sounds normal. No respiratory distress. She has no wheezes. She has no rales. She exhibits tenderness (sternal). She exhibits no crepitus, no edema, no deformity, no swelling and no retraction.  Abdominal: Soft. She exhibits no distension. There is tenderness in the epigastric area, suprapubic area and left lower quadrant. There is no rigidity, no rebound, no guarding, no CVA tenderness, no tenderness at McBurney's point and negative Murphy's sign.  Genitourinary: Cervix exhibits no motion tenderness and no friability.  Right adnexum displays no mass, no tenderness and no fullness. Left adnexum displays no mass, no tenderness and no fullness. There is bleeding (minimal) in the vagina. No foreign body in the vagina. Vaginal discharge (minimal white) found.  Genitourinary Comments: RN present as chaperone  Musculoskeletal:  No obvious deformity, appreciable swelling, erythema, ecchymosis, edema, or open wounds.  Extremities are nontender.  Neurological: She is alert.  Clear speech.  CN III through XII grossly intact.  5 out of 5 symmetric grip strength.  5 out of 5 strength with plantar dorsiflexion bilaterally.  Sensation grossly intact bilateral upper and lower extremities.  Normal finger-nose.  Negative pronator drift.  Steady gait.  Skin: Skin is warm and dry. No rash noted.  Psychiatric: Her speech is normal and behavior is normal. Her mood appears anxious.  Nursing note and vitals reviewed.  ED Treatments / Results  Labs Results for orders placed or performed during the hospital encounter of 05/29/18  Lipase, blood  Result Value Ref Range   Lipase 31 11 - 51 U/L  Comprehensive metabolic panel  Result Value Ref Range   Sodium 140 135 - 145 mmol/L   Potassium 3.6 3.5 - 5.1 mmol/L   Chloride 108 98 - 111 mmol/L   CO2 23 22 - 32 mmol/L   Glucose, Bld 115 (H) 70 - 99 mg/dL   BUN 5 (L) 6 - 20 mg/dL   Creatinine, Ser 0.88 0.44 - 1.00 mg/dL   Calcium 8.8 (L) 8.9 - 10.3 mg/dL   Total Protein 6.6 6.5 - 8.1 g/dL   Albumin 3.6 3.5 - 5.0 g/dL   AST 27 15 - 41 U/L   ALT 33 0 - 44 U/L   Alkaline Phosphatase 43 38 - 126 U/L   Total Bilirubin 0.9 0.3 - 1.2 mg/dL   GFR calc non Af Amer >60 >60 mL/min   GFR calc Af Amer >60 >60 mL/min   Anion gap 9 5 - 15  CBC  Result Value Ref Range   WBC 7.4 4.0 - 10.5 K/uL   RBC 4.25 3.87 - 5.11 MIL/uL   Hemoglobin 13.7 12.0 - 15.0 g/dL   HCT 42.8 36.0 - 46.0 %   MCV 100.7 (H) 78.0 - 100.0 fL   MCH 32.2 26.0 - 34.0 pg   MCHC 32.0 30.0 - 36.0 g/dL   RDW 12.8 11.5 -  15.5 %   Platelets 253 150 - 400 K/uL  Urinalysis, Routine w reflex microscopic  Result Value Ref Range   Color, Urine YELLOW YELLOW   APPearance CLOUDY (A) CLEAR   Specific Gravity, Urine 1.018 1.005 - 1.030   pH 5.0 5.0 - 8.0   Glucose, UA NEGATIVE NEGATIVE mg/dL   Hgb urine dipstick LARGE (A) NEGATIVE   Bilirubin Urine NEGATIVE NEGATIVE   Ketones, ur NEGATIVE NEGATIVE mg/dL   Protein, ur 30 (A) NEGATIVE mg/dL   Nitrite NEGATIVE NEGATIVE   Leukocytes, UA MODERATE (A) NEGATIVE   RBC / HPF 0-5 0 - 5 RBC/hpf   WBC, UA 11-20 0 - 5 WBC/hpf   Bacteria, UA RARE (A) NONE SEEN   Squamous Epithelial / LPF 11-20 0 - 5   Mucus PRESENT   I-Stat beta hCG blood, ED  Result Value Ref Range   I-stat hCG, quantitative <5.0 <5 mIU/mL   Comment 3          I-Stat Troponin, ED (not at [facility])  Result Value Ref Range   Troponin i, poc 0.00 0.00 - 0.08 ng/mL   Comment 3          I-Stat Troponin, ED (not at [facility])  Result Value Ref Range   Troponin i, poc 0.00 0.00 - 0.08 ng/mL   Comment 3  EKG EKG Interpretation  Date/Time:  Friday January 01 2010 18:32:51 EST Ventricular Rate:  88 PR Interval:  128 QRS Duration: 78 QT Interval:  358 QTC Calculation: 433 R Axis:   12 Text Interpretation:  Normal sinus rhythm Nonspecific ST and T wave abnormality Abnormal ECG No old tracing to compare Also confirmed by Kingsley Spittle [phone]), [address], Janett Billow [phone])  on 05/29/2018 3:29:13 PM   Radiology Dg Chest 2 View  Result Date: 05/29/2018 CLINICAL DATA:  Left lower quadrant abdominal pain since yesterday. EXAM: CHEST - 2 VIEW COMPARISON:  02/10/2012 CXR FINDINGS: The heart size and mediastinal contours are within normal limits. Both lungs are clear. The visualized skeletal structures are unremarkable. IMPRESSION: No active cardiopulmonary disease. Electronically Signed   By: Ashley Royalty M.D.   On: 05/29/2018 17:55   Ct Abdomen Pelvis W Contrast  Result Date: 05/29/2018 CLINICAL DATA:   52 year old female with LEFT abdominal and pelvic pain and nausea for 2 days. History of gastric bypass. EXAM: CT ABDOMEN AND PELVIS WITH CONTRAST TECHNIQUE: Multidetector CT imaging of the abdomen and pelvis was performed using the standard protocol following bolus administration of intravenous contrast. CONTRAST:  177mL OMNIPAQUE IOHEXOL 300 MG/ML  SOLN COMPARISON:  09/03/2015 and prior CTs FINDINGS: Lower chest: No acute abnormality. Hepatobiliary: No significant hepatic abnormalities. Two small hypodense lesions within the anterior liver are unchanged from 2016. The patient is status post cholecystectomy. No biliary dilatation. Pancreas: Unremarkable Spleen: Unremarkable Adrenals/Urinary Tract: The kidneys, adrenal glands and bladder are unremarkable. Stomach/Bowel: Gastric bypass changes are again noted. There is no evidence of bowel obstruction, definite bowel wall thickening or inflammatory changes. Vascular/Lymphatic: Mild aortic atherosclerosis. No enlarged abdominal or pelvic lymph nodes. Reproductive: Uterus and bilateral adnexa are unremarkable. Other: No free fluid, abscess or pneumoperitoneum. Musculoskeletal: No acute or significant osseous findings. IMPRESSION: 1. No acute abnormality. No CT findings to suggest a cause for this patient's abdominal pain. 2.  Aortic Atherosclerosis (ICD10-I70.0). Electronically Signed   By: Margarette Canada M.D.   On: 05/29/2018 19:17    Procedures Procedures (including critical care time)  Medications Ordered in ED Medications  sodium chloride 0.9 % bolus 1,000 mL (1,000 mLs Intravenous Not Given 05/29/18 2021)  iohexol (OMNIPAQUE) 300 MG/ML solution 100 mL (100 mLs Intravenous Contrast Given 05/29/18 1843)     Initial Impression / Assessment and Plan / ED Course  I have reviewed the triage vital signs and the nursing notes.  Pertinent labs & imaging results that were available during my care of the patient were reviewed by me and considered in my medical  decision making (see chart for details).   Patient presents with multiple complaints including abdominal pain, chest discomfort, and upper extremity paresthesias.  She is nontoxic-appearing, no apparent distress, somewhat anxious appearing, vitals WNL with exception of initial tachycardia which is improved throughout visit and is normal on my exam, elevated BP, doubt HTN emergency.  Exam notable for mild tenderness palpation of the sternum, epigastric area, and left lower quadrant/suprapubic region.  No peritoneal signs.  Otherwise benign.  Regarding her paresthesias to upper extremities, these are almost resolved, sensation and strength are intact.  Will evaluate with labs, EKG, chest x-ray, and CT abdomen pelvis.  Treatment initiated with fluids.Labs reviewed and fairly unremarkable.  Mild hyperglycemia at 115.  Mild hypocalcemia at 8.8, otherwise no significant electrolyte abnormalities.  No leukocytosis.  No anemia.  Lipase and LFTs within normal limits.  UA somewhat suspicious for infection.  She does have proteinuria hemoglobinuria  as well that will require PCP recheck.    Regarding her chest pain: Her chest x-ray is normal, no evidence of pneumonia, pneumothorax, or pleural effusion.  Her EKG is nonspecific, delta troponin negative heart score of 3, doubt ACS. Patient denies leg pain/swelling, hemoptysis, recent surgery/trauma, recent long travel, hormone use, personal hx of cancer, or hx of DVT/PE, pain is not pleuritic in nature, no dyspnea, doubt pulmonary embolism.  Pain is not tearing sensation, symmetric pulses, no widening of the mediastinum on imaging, doubt dissection.  Patient's pain to the chest is intermittent, mild, reproducible with sternal palpation.  Unclear definitive etiology.  Regarding her abdominal discomfort: No peritoneal signs on initial or repeat abdominal exams.  Pelvic exam with minimal discharge/vaginal bleeding which patient reports has been ongoing with recent gynecologic  procedures, nontender. CT abdomen pelvis negative for acute abnormalities. Patient does not appear to have a surgical abdomen. No indication of appendicitis, bowel obstruction, bowel perforation, cholecystitis, diverticulitis, PID or ectopic pregnancy.  CT did not show evidence of nephrolithiasis. Throughout ER stay started to have some urinary frequency given UA with this will cover for UTI with Keflex.   Unclear definitive etiology to patient's array of symptoms.  I suspect there is somewhat of an anxiety component.  She appears hemodynamically stable and safe for discharge home with close PCP follow-up.  She did not wish to stay for wet prep results from pelvic, she reported she wanted to go home to get some rest- there was not evidence of BV or yeast.  I discussed results, treatment plan, need for PCP follow-up, and return precautions with the patient. Provided opportunity for questions, patient confirmed understanding and is in agreement with plan.   Findings and plan of care discussed with supervising physician Dr. Wilson Singer who is in agreement with plan.   Final Clinical Impressions(s) / ED Diagnoses   Final diagnoses:  Abdominal pain, unspecified abdominal location    ED Discharge Orders        Ordered    cephALEXin (KEFLEX) 500 MG capsule  2 times daily     05/29/18 2122    oxyCODONE-acetaminophen (PERCOCET/ROXICET) 5-325 MG tablet  Every 6 hours PRN     05/29/18 2122       Amaryllis Dyke, PA-C 05/29/18 2203    Virgel Manifold, MD 05/30/18 1252

## 2018-05-29 NOTE — Telephone Encounter (Signed)
Call transferred from front office. Patient requesting OV today. Reports sharp pain in left mid abdomen, started on 6/24. Currently at work, reports feeling "uncomfortable in chest", "little SOB", heartburn, burping, and tingling in both upper extremities from elbow down, symptoms started 1 hr ago.   Denies vaginal bleeding. Reports hx of gastric bypass.   Instructed patient to go directly to ER/Urgent Care for further evaluation. If OV still needed with Dr. Quincy Simmonds after evaluation, return call to office. Patient verbalizes understanding and is agreeable.   Routing to covering provider for final review. Patient is agreeable to disposition. Will close encounter.   Cc: Dr. Quincy Simmonds

## 2018-05-29 NOTE — ED Notes (Signed)
Pt adament about not getting another IV, sts she doesn't want to have to go through the pain of getting the IV again.

## 2018-05-29 NOTE — Telephone Encounter (Signed)
Patient calling with concerns of sharp pain in her stomach, tingling in hands and chest discomfort.

## 2018-05-29 NOTE — Discharge Instructions (Addendum)
You were seen in the emergency department today for abdominal pain, chest pain, and tingling in your arms.  Your work-up in the emergency department was reassuring.  There were no abnormalities on your chest x-ray or CT scan to explain your pain.  Your labs were all fairly normal.  The EKG and enzyme we used to look at your heart did not show signs of a heart attack.  Your urine appears infected therefore we are starting you on an antibiotic to treat you for this.  We are sending you home with Percocet to help treat your pain-this is a narcotic pain medication, do not drive or operate machinery when taking this medication.  Do not drink alcohol when taking this medicine.  Do not mix with other sedating medications.. Take these medications as prescribed.   We have prescribed you new medication(s) today. Discuss the medications prescribed today with your pharmacist as they can have adverse effects and interactions with your other medicines including over the counter and prescribed medications. Seek medical evaluation if you start to experience new or abnormal symptoms after taking one of these medicines, seek care immediately if you start to experience difficulty breathing, feeling of your throat closing, facial swelling, or rash as these could be indications of a more serious allergic reaction  Follow-up with your primary care provider within 2 days for reevaluation.  Return to the ER for new or worsening symptoms including but not limited to worsening pain, inability to keep fluids down, fever, trouble breathing, or any other concerns

## 2018-05-29 NOTE — ED Triage Notes (Signed)
Pt presents with 2 day h/o L sided abdominal pain with intermittent nausea and belching followed by burning in chest,  Reports tingling in both arms and fingers.  Pt denies any diarrhea or dysuria.

## 2018-05-29 NOTE — ED Provider Notes (Signed)
Patient placed in Quick Look pathway, seen and evaluated   Chief Complaint: [place] sided abdominal pain.  Tingling in bilateral arms from elbows down.   HPI:   Patient has left abdominal pain for the past 2 days with belching, and nausea , no interventions tried PTA.  Tingling in bilateral arms for a few hours from elbows down.  She reports chest pain.  Burning.   ROS: No shortness of breath (one)  Physical Exam:   Gen: No distress  Neuro: Awake and Alert  Skin: Warm    Focused Exam: Awake, alert, no obvious neuro deficits.    Initiation of care has begun. The patient has been counseled on the process, plan, and necessity for staying for the completion/evaluation, and the remainder of the medical screening examination    Ollen Gross 05/29/18 1521    Tegeler, Gwenyth Allegra, MD 05/29/18 [phone]

## 2018-05-30 LAB — GC/CHLAMYDIA PROBE AMP (~~LOC~~) NOT AT ARMC
CHLAMYDIA, DNA PROBE: NEGATIVE
Neisseria Gonorrhea: NEGATIVE

## 2018-05-31 DIAGNOSIS — R109 Unspecified abdominal pain: Secondary | ICD-10-CM | POA: Diagnosis not present

## 2018-05-31 DIAGNOSIS — R82998 Other abnormal findings in urine: Secondary | ICD-10-CM | POA: Diagnosis not present

## 2018-06-04 ENCOUNTER — Other Ambulatory Visit: Payer: Self-pay | Admitting: Physician Assistant

## 2018-06-04 ENCOUNTER — Ambulatory Visit
Admission: RE | Admit: 2018-06-04 | Discharge: 2018-06-04 | Disposition: A | Discharge status: Home / Self Care | Payer: 59 | Source: Ambulatory Visit | Attending: Physician Assistant | Admitting: Physician Assistant

## 2018-06-04 DIAGNOSIS — R822 Biliuria: Secondary | ICD-10-CM | POA: Diagnosis not present

## 2018-06-04 DIAGNOSIS — R1084 Generalized abdominal pain: Secondary | ICD-10-CM | POA: Diagnosis not present

## 2018-06-04 DIAGNOSIS — K5900 Constipation, unspecified: Secondary | ICD-10-CM

## 2018-06-04 DIAGNOSIS — R109 Unspecified abdominal pain: Secondary | ICD-10-CM | POA: Diagnosis not present

## 2018-06-05 ENCOUNTER — Other Ambulatory Visit (HOSPITAL_COMMUNITY): Payer: Self-pay | Admitting: Physician Assistant

## 2018-06-05 DIAGNOSIS — R7989 Other specified abnormal findings of blood chemistry: Secondary | ICD-10-CM

## 2018-06-05 DIAGNOSIS — R1084 Generalized abdominal pain: Secondary | ICD-10-CM

## 2018-06-05 DIAGNOSIS — R945 Abnormal results of liver function studies: Secondary | ICD-10-CM

## 2018-06-06 ENCOUNTER — Ambulatory Visit (HOSPITAL_COMMUNITY)
Admission: RE | Admit: 2018-06-06 | Discharge: 2018-06-06 | Disposition: A | Discharge status: Home / Self Care | Payer: 59 | Source: Ambulatory Visit | Attending: Physician Assistant | Admitting: Physician Assistant

## 2018-06-06 DIAGNOSIS — R1084 Generalized abdominal pain: Secondary | ICD-10-CM | POA: Diagnosis not present

## 2018-06-06 DIAGNOSIS — R945 Abnormal results of liver function studies: Secondary | ICD-10-CM | POA: Insufficient documentation

## 2018-06-06 DIAGNOSIS — R7989 Other specified abnormal findings of blood chemistry: Secondary | ICD-10-CM

## 2018-06-06 DIAGNOSIS — D1803 Hemangioma of intra-abdominal structures: Secondary | ICD-10-CM | POA: Diagnosis not present

## 2018-06-06 DIAGNOSIS — D1809 Hemangioma of other sites: Secondary | ICD-10-CM | POA: Diagnosis not present

## 2018-06-06 DIAGNOSIS — Z9049 Acquired absence of other specified parts of digestive tract: Secondary | ICD-10-CM | POA: Diagnosis not present

## 2018-06-08 DIAGNOSIS — G43709 Chronic migraine without aura, not intractable, without status migrainosus: Secondary | ICD-10-CM | POA: Diagnosis not present

## 2018-06-11 ENCOUNTER — Ambulatory Visit (INDEPENDENT_AMBULATORY_CARE_PROVIDER_SITE_OTHER): Payer: 59 | Admitting: Obstetrics and Gynecology

## 2018-06-11 ENCOUNTER — Other Ambulatory Visit: Payer: Self-pay

## 2018-06-11 ENCOUNTER — Encounter: Payer: Self-pay | Admitting: Obstetrics and Gynecology

## 2018-06-11 VITALS — BP 124/82 | HR 84 | Resp 18 | Ht 63.0 in | Wt 178.0 lb

## 2018-06-11 DIAGNOSIS — R1033 Periumbilical pain: Secondary | ICD-10-CM | POA: Diagnosis not present

## 2018-06-11 DIAGNOSIS — N870 Mild cervical dysplasia: Secondary | ICD-10-CM

## 2018-06-11 NOTE — Progress Notes (Signed)
GYNECOLOGY  VISIT   HPI: 52 y.o.   Divorced  Caucasian  female   G1P1 with Patient's last menstrual period was 05/19/2018.   here for 1 month f/u after LEEP.  Bled for almost one month after her LEEP.   LEEP and ECC pathology - 05/08/18 - both normal.  Colposcopy - LGSIL - 04/27/18 -  Unsatisfactory.  Pap - LGSIL and possible HGSIL, positive HR HPV - 04/17/18.  GYNECOLOGIC HISTORY: Patient's last menstrual period was 05/19/2018. Contraception:  Tubal ligation Menopausal hormone therapy:  none Last mammogram:  10/19/17 BIRADS 1 negative/density b Last pap smear:  05/08/18 LEEP showing normal cells with no sign of atypia, dysplasia or malignancy in either the LEEP or the [facility] 04/27/18 Colpo showing LGSIL, 04/17/18 pap LGSIL and possible HGSIL:Pos HR HPV 02-17-17 Neg:Neg HR HPV, 11-20-15 Neg:Neg HR HPV 1993 Hx conization of cervix--CIN 3, 10-07-13 pap Ascus:Pos HR HPV;LEEP procedure--LGSIL.05-14-14 pap Neg:Pos HR HPV with repeat pap 10-09-14 Neg:Neg HR HPV.        OB History    Gravida  1   Para  1   Term      Preterm      AB      Living  1     SAB      TAB      Ectopic      Multiple  1   Live Births                 Patient Active Problem List   Diagnosis Date Noted  . Vitamin D deficiency 05/23/2017  . Anxiety 06/19/2015  . Anemia, iron deficiency 06/19/2015  . Pernicious anemia 06/19/2015  . 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 Protein-calorie malnutrition, severe ([place]) 04/18/2015  . Severe protein-calorie malnutrition ([place]) 04/18/2015  . 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 Bariatric surgery status 04/18/2015  . 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 GERD (gastroesophageal reflux disease) 04/17/2015  . Hypokalemia 04/16/2015  . Migraine with aura 05/21/2014  . 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 Abnormal Pap smear   . Anemia   .  Anxiety   . Broken toe 12-22-15   middle toe right foot  . Cataract of both eyes   . Depression   . GERD (gastroesophageal reflux disease)   . Headache(784.0)   . Heart murmur   . History of kidney stones   . 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 Perforated bowel ([place])   . Pernicious anemia 06/19/2015  . PONV (postoperative nausea and vomiting)   . Thyroid disease    hypothyrodism    Past Surgical History:  Procedure Laterality Date  . CATARACT EXTRACTION, BILATERAL Bilateral 02/2016  . CERVICAL BIOPSY  W/ LOOP ELECTRODE EXCISION  11/2013   LGSIL  . 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 CESAREAN SECTION  2008  . CHOLECYSTECTOMY    . GASTRIC BYPASS  2002  . HYSTEROSCOPY W/D&C  10/14/2011   Procedure: DILATATION AND CURETTAGE (D&C) /HYSTEROSCOPY;  Surgeon: Arloa Koh;  Location: [facility];  Service: Gynecology;  Laterality: Bilateral;  . LASIK    . NOSE SURGERY    . STOMACH SURGERY  04/20/2015   dilation of "connection between stomach and intestine", [facility]  . TONSILLECTOMY    . TONSILLECTOMY    . TUBAL LIGATION  2008    Current Outpatient Medications  Medication Sig Dispense Refill  .  ALPRAZolam (XANAX) 1 MG tablet Take 1 mg by mouth 2 (two) times daily as needed. Anxiety      . Armodafinil 250 MG tablet Take 250 mg by mouth every morning.  2  . cyanocobalamin (,VITAMIN B-12,) 1000 MCG/ML injection Monthly injection  0  . DEXILANT 60 MG capsule Take 60 mg by mouth daily.    Marland Kitchen dicyclomine (BENTYL) 10 MG capsule TK 1 C PO TID  0  . divalproex (DEPAKOTE) 500 MG DR tablet Take 1 tablet (500 mg total) by mouth 2 (two) times daily. 180 tablet 4  . eletriptan (RELPAX) 40 MG tablet Take 1 tablet (40 mg total) by mouth as needed for migraine or headache. May repeat in 2 hours if needed. Max 2 tabs per day or 8 tabs per month. 10 tablet 12  . ergocalciferol (VITAMIN D2) 50000 UNITS capsule Take 50,000 Units by mouth. She is  taking daily M-F - pr pt    . fexofenadine (ALLEGRA) 180 MG tablet Take 180 mg by mouth daily as needed. allergies     . fluticasone (FLONASE) 50 MCG/ACT nasal spray Place 1 spray into both nostrils as needed.  1  . imipramine (TOFRANIL) 50 MG tablet Take 2 tablets by mouth daily.  0  . lansoprazole (PREVACID) 30 MG capsule Take 30 mg by mouth daily as needed. Acid reflux      . linaclotide (LINZESS) 72 MCG capsule Take 72 mcg by mouth as needed.    . Multiple Vitamins-Minerals (MULTIVITAMIN WITH MINERALS) tablet Take 1 tablet by mouth daily.      Marland Kitchen nystatin (NYSTATIN) powder Apply topically 2 (two) times daily. 45 g 1  . Polyethyl Glycol-Propyl Glycol 0.4-0.3 % SOLN Place 1 drop into both eyes as needed.    . ranitidine (ZANTAC) 150 MG capsule     . thiamine (VITAMIN B-1) 50 MG tablet daily.  0  . tiZANidine (ZANAFLEX) 4 MG capsule Take 2-4 mg by mouth at bedtime.  0  . venlafaxine XR (EFFEXOR-XR) 150 MG 24 hr capsule Take 1 capsule by mouth daily.  0  . zonisamide (ZONEGRAN) 100 MG capsule Take 1 capsule (100 mg total) by mouth 2 (two) times daily. 180 capsule 4  . traMADol (ULTRAM) 50 MG tablet Take 50 mg by mouth every 8 (eight) hours as needed. for pain  0   No current facility-administered medications for this visit.      ALLERGIES: Naproxen; Monistat [miconazole]; Sulfa antibiotics; and Tioconazole  Family History  Problem Relation Age of Onset  . Diabetes Mother   . Heart disease Father   . Social phobia Father   . Psychiatric Illness Father   . Hypertension Father   . Stroke Father   . Dementia Father   . Heart disease Unknown   . Diabetes Unknown   . Stroke Unknown   . Hypertension Maternal Grandmother   . Thyroid disease Maternal Grandmother   . Hypertension Paternal Grandmother   . Stroke Paternal Grandfather   . Breast cancer Neg Hx     Social History   Socioeconomic History  . Marital status: Divorced    Spouse name: Not on file  . Number of children: 1  .  Years of education: College  . Highest education level: Not on file  Occupational History    Employer: [employer]  . Financial resource strain: Not on file  . Food insecurity:    Worry: Not on file    Inability: Not on file  .  Transportation needs:    Medical: Not on file    Non-medical: Not on file  Tobacco Use  . Smoking status: Never Smoker  . Smokeless tobacco: Never Used  Substance and Sexual Activity  . Alcohol use: Yes    Alcohol/week: 0.0 oz    Comment: Once a month-rarely  . Drug use: No  . Sexual activity: Yes    Partners: Male    Birth control/protection: Surgical    Comment: tubal  Lifestyle  . Physical activity:    Days per week: Not on file    Minutes per session: Not on file  . Stress: Not on file  Relationships  . Social connections:    Talks on phone: Not on file    Gets together: Not on file    Attends religious service: Not on file    Active member of club or organization: Not on file    Attends meetings of clubs or organizations: Not on file    Relationship status: Not on file  . Intimate partner violence:    Fear of current or ex partner: Not on file    Emotionally abused: Not on file    Physically abused: Not on file    Forced sexual activity: Not on file  Other Topics Concern  . Not on file  Social History Narrative   Pt lives at home with her family.   Caffeine Use: once daily    Review of Systems  Constitutional: Negative.   HENT: Negative.   Eyes: Negative.   Respiratory: Negative.   Cardiovascular: Negative.   Gastrointestinal: Negative.   Endocrine: Negative.   Genitourinary: Negative.   Musculoskeletal: Negative.   Skin: Negative.   Allergic/Immunologic: Negative.   Neurological: Negative.   Hematological: Negative.   Psychiatric/Behavioral: Negative.     PHYSICAL EXAMINATION:    BP 124/82 (BP Location: Right Arm, Patient Position: Sitting, Cuff Size: Large)   Pulse 84   Resp 18   Ht 5\' 3"  (1.6 m)    Wt 178 lb (80.7 kg)   LMP 05/19/2018   BMI 31.53 kg/m     General appearance: alert, cooperative and appears stated age   Pelvic: External genitalia:  no lesions              Urethra:  normal appearing urethra with no masses, tenderness or lesions              Bartholins and Skenes: normal                 Vagina: normal appearing vagina with normal color and discharge, no lesions              Cervix: no lesions.  Well healed.                 Bimanual Exam:  Uterus:  normal size, contour, position, consistency, mobility, non-tender              Adnexa: no mass, fullness, tenderness             Chaperone was present for exam.  ASSESSMENT  Status post LEEP.  Final pathology negative.  Colpo biopsy showing LGSIL.  Hx prior cervical conization and now LEEP x 2. Hx gastric bypass surgery.   PLAN  We had a comprehensive discussion regarding her HPV status, colposcopy findings and final pathology on the LEEP.  I am recommending she have a pap and HR HPV in October.  If she needs future hysterectomy, she may be  served well by doing this at [facility] where she had her gastric bypass surgery and she has had some complications following this.    I am not recommending hysterectomy at this time.  Questions invited and answered.    An After Visit Summary was printed and given to the patient.  __40____ minutes face to face time of which over 50% was spent in counseling.

## 2018-06-20 DIAGNOSIS — R945 Abnormal results of liver function studies: Secondary | ICD-10-CM | POA: Diagnosis not present

## 2018-06-20 DIAGNOSIS — R1084 Generalized abdominal pain: Secondary | ICD-10-CM | POA: Diagnosis not present

## 2018-06-20 DIAGNOSIS — Z23 Encounter for immunization: Secondary | ICD-10-CM | POA: Diagnosis not present

## 2018-06-20 DIAGNOSIS — K5900 Constipation, unspecified: Secondary | ICD-10-CM | POA: Diagnosis not present

## 2018-06-28 ENCOUNTER — Other Ambulatory Visit: Payer: Self-pay | Admitting: Diagnostic Neuroimaging

## 2018-07-05 DIAGNOSIS — K5909 Other constipation: Secondary | ICD-10-CM | POA: Diagnosis not present

## 2018-07-05 DIAGNOSIS — R740 Nonspecific elevation of levels of transaminase and lactic acid dehydrogenase [LDH]: Secondary | ICD-10-CM | POA: Diagnosis not present

## 2018-07-05 DIAGNOSIS — R1084 Generalized abdominal pain: Secondary | ICD-10-CM | POA: Diagnosis not present

## 2018-07-09 DIAGNOSIS — G43709 Chronic migraine without aura, not intractable, without status migrainosus: Secondary | ICD-10-CM | POA: Diagnosis not present

## 2018-07-10 DIAGNOSIS — E531 Pyridoxine deficiency: Secondary | ICD-10-CM | POA: Diagnosis not present

## 2018-07-10 DIAGNOSIS — Z9884 Bariatric surgery status: Secondary | ICD-10-CM | POA: Diagnosis not present

## 2018-07-10 DIAGNOSIS — E039 Hypothyroidism, unspecified: Secondary | ICD-10-CM | POA: Diagnosis not present

## 2018-07-10 DIAGNOSIS — Z6831 Body mass index (BMI) 31.0-31.9, adult: Secondary | ICD-10-CM | POA: Diagnosis not present

## 2018-07-17 ENCOUNTER — Inpatient Hospital Stay: Payer: 59 | Attending: Hematology & Oncology

## 2018-07-17 DIAGNOSIS — K589 Irritable bowel syndrome without diarrhea: Secondary | ICD-10-CM | POA: Insufficient documentation

## 2018-07-17 DIAGNOSIS — Z903 Acquired absence of stomach [part of]: Secondary | ICD-10-CM

## 2018-07-17 DIAGNOSIS — D500 Iron deficiency anemia secondary to blood loss (chronic): Secondary | ICD-10-CM | POA: Diagnosis not present

## 2018-07-17 DIAGNOSIS — D508 Other iron deficiency anemias: Secondary | ICD-10-CM

## 2018-07-17 DIAGNOSIS — K912 Postsurgical malabsorption, not elsewhere classified: Secondary | ICD-10-CM

## 2018-07-17 DIAGNOSIS — Z9884 Bariatric surgery status: Secondary | ICD-10-CM | POA: Insufficient documentation

## 2018-07-17 DIAGNOSIS — N920 Excessive and frequent menstruation with regular cycle: Secondary | ICD-10-CM | POA: Insufficient documentation

## 2018-07-17 DIAGNOSIS — D510 Vitamin B12 deficiency anemia due to intrinsic factor deficiency: Secondary | ICD-10-CM | POA: Diagnosis not present

## 2018-07-17 LAB — CBC WITH DIFFERENTIAL (CANCER CENTER ONLY)
BASOS PCT: 0 %
Basophils Absolute: 0 10*3/uL (ref 0.0–0.1)
EOS ABS: 0.1 10*3/uL (ref 0.0–0.5)
Eosinophils Relative: 1 %
HCT: 43.7 % (ref 34.8–46.6)
HEMOGLOBIN: 14.7 g/dL (ref 11.6–15.9)
LYMPHS ABS: 2.7 10*3/uL (ref 0.9–3.3)
Lymphocytes Relative: 37 %
MCH: 32.7 pg (ref 25.1–34.0)
MCHC: 33.6 g/dL (ref 31.5–36.0)
MCV: 97.1 fL (ref 79.5–101.0)
Monocytes Absolute: 0.5 10*3/uL (ref 0.1–0.9)
Monocytes Relative: 7 %
NEUTROS PCT: 55 %
Neutro Abs: 4 10*3/uL (ref 1.5–6.5)
Platelet Count: 264 10*3/uL (ref 145–400)
RBC: 4.5 MIL/uL (ref 3.70–5.45)
RDW: 12.1 % (ref 11.2–14.5)
WBC: 7.3 10*3/uL (ref 3.9–10.3)

## 2018-07-17 LAB — CMP (CANCER CENTER ONLY)
ALT: 36 U/L (ref 0–44)
ANION GAP: 12 (ref 5–15)
AST: 16 U/L (ref 15–41)
Albumin: 3.9 g/dL (ref 3.5–5.0)
Alkaline Phosphatase: 88 U/L (ref 38–126)
BUN: 13 mg/dL (ref 6–20)
CALCIUM: 10 mg/dL (ref 8.9–10.3)
CHLORIDE: 103 mmol/L (ref 98–111)
CO2: 25 mmol/L (ref 22–32)
CREATININE: 0.86 mg/dL (ref 0.44–1.00)
Glucose, Bld: 110 mg/dL — ABNORMAL HIGH (ref 70–99)
Potassium: 4.3 mmol/L (ref 3.5–5.1)
SODIUM: 140 mmol/L (ref 135–145)
Total Bilirubin: 0.5 mg/dL (ref 0.3–1.2)
Total Protein: 7.1 g/dL (ref 6.5–8.1)

## 2018-07-17 LAB — FERRITIN: FERRITIN: 490 ng/mL — AB (ref 11–307)

## 2018-07-17 LAB — IRON AND TIBC
Iron: 111 ug/dL (ref 41–142)
SATURATION RATIOS: 45 % (ref 21–57)
TIBC: 248 ug/dL (ref 236–444)
UIBC: 137 ug/dL

## 2018-07-19 ENCOUNTER — Encounter: Payer: Self-pay | Admitting: Family

## 2018-07-19 ENCOUNTER — Inpatient Hospital Stay: Payer: 59

## 2018-07-19 ENCOUNTER — Other Ambulatory Visit: Payer: Self-pay

## 2018-07-19 ENCOUNTER — Inpatient Hospital Stay (HOSPITAL_BASED_OUTPATIENT_CLINIC_OR_DEPARTMENT_OTHER): Payer: 59 | Admitting: Family

## 2018-07-19 VITALS — BP 135/78 | HR 95 | Temp 98.7°F | Resp 18 | Wt 170.0 lb

## 2018-07-19 DIAGNOSIS — D508 Other iron deficiency anemias: Secondary | ICD-10-CM

## 2018-07-19 DIAGNOSIS — N920 Excessive and frequent menstruation with regular cycle: Secondary | ICD-10-CM

## 2018-07-19 DIAGNOSIS — K589 Irritable bowel syndrome without diarrhea: Secondary | ICD-10-CM | POA: Diagnosis not present

## 2018-07-19 DIAGNOSIS — D510 Vitamin B12 deficiency anemia due to intrinsic factor deficiency: Secondary | ICD-10-CM

## 2018-07-19 DIAGNOSIS — D500 Iron deficiency anemia secondary to blood loss (chronic): Secondary | ICD-10-CM | POA: Diagnosis not present

## 2018-07-19 DIAGNOSIS — Z903 Acquired absence of stomach [part of]: Secondary | ICD-10-CM

## 2018-07-19 DIAGNOSIS — K912 Postsurgical malabsorption, not elsewhere classified: Secondary | ICD-10-CM

## 2018-07-19 DIAGNOSIS — Z9884 Bariatric surgery status: Secondary | ICD-10-CM

## 2018-07-19 MED ORDER — CYANOCOBALAMIN 1000 MCG/ML IJ SOLN
1000.0000 ug | Freq: Once | INTRAMUSCULAR | Status: AC
  Administered 2018-07-19: 1000 ug via INTRAMUSCULAR

## 2018-07-19 MED ORDER — CYANOCOBALAMIN 1000 MCG/ML IJ SOLN
INTRAMUSCULAR | Status: AC
  Filled 2018-07-19: qty 1

## 2018-07-19 NOTE — Patient Instructions (Signed)
Cyanocobalamin, Vitamin B12 injection What is this medicine? CYANOCOBALAMIN (sye an oh koe BAL a min) is a man made form of vitamin B12. Vitamin B12 is used in the growth of healthy blood cells, nerve cells, and proteins in the body. It also helps with the metabolism of fats and carbohydrates. This medicine is used to treat people who can not absorb vitamin B12. This medicine may be used for other purposes; ask your health care provider or pharmacist if you have questions. COMMON BRAND NAME(S): B-12 Compliance Kit, B-12 Injection Kit, Cyomin, LA-12, Nutri-Twelve, Physicians EZ Use B-12, Primabalt What should I tell my health care provider before I take this medicine? They need to know if you have any of these conditions: -kidney disease -Leber's disease -megaloblastic anemia -an unusual or allergic reaction to cyanocobalamin, cobalt, other medicines, foods, dyes, or preservatives -pregnant or trying to get pregnant -breast-feeding How should I use this medicine? This medicine is injected into a muscle or deeply under the skin. It is usually given by a health care professional in a clinic or doctor's office. However, your doctor may teach you how to inject yourself. Follow all instructions. Talk to your pediatrician regarding the use of this medicine in children. Special care may be needed. Overdosage: If you think you have taken too much of this medicine contact a poison control center or emergency room at once. NOTE: This medicine is only for you. Do not share this medicine with others. What if I miss a dose? If you are given your dose at a clinic or doctor's office, call to reschedule your appointment. If you give your own injections and you miss a dose, take it as soon as you can. If it is almost time for your next dose, take only that dose. Do not take double or extra doses. What may interact with this medicine? -colchicine -heavy alcohol intake This list may not describe all possible  interactions. Give your health care provider a list of all the medicines, herbs, non-prescription drugs, or dietary supplements you use. Also tell them if you smoke, drink alcohol, or use illegal drugs. Some items may interact with your medicine. What should I watch for while using this medicine? Visit your doctor or health care professional regularly. You may need blood work done while you are taking this medicine. You may need to follow a special diet. Talk to your doctor. Limit your alcohol intake and avoid smoking to get the best benefit. What side effects may I notice from receiving this medicine? Side effects that you should report to your doctor or health care professional as soon as possible: -allergic reactions like skin rash, itching or hives, swelling of the face, lips, or tongue -blue tint to skin -chest tightness, pain -difficulty breathing, wheezing -dizziness -red, swollen painful area on the leg Side effects that usually do not require medical attention (report to your doctor or health care professional if they continue or are bothersome): -diarrhea -headache This list may not describe all possible side effects. Call your doctor for medical advice about side effects. You may report side effects to FDA at 1-800-FDA-1088. Where should I keep my medicine? Keep out of the reach of children. Store at room temperature between 15 and 30 degrees C (59 and 85 degrees F). Protect from light. Throw away any unused medicine after the expiration date. NOTE: This sheet is a summary. It may not cover all possible information. If you have questions about this medicine, talk to your doctor, pharmacist, or   health care provider.  2018 Elsevier/Gold Standard (2008-03-03 22:10:20)  

## 2018-07-19 NOTE — Progress Notes (Signed)
Hematology and Oncology Follow Up Visit  Megan Davila [id number] [birth year] 52 y.o. 07/19/2018   Principle Diagnosis:  Iron deficiency anemia secondary to malabsorption Pernicious anemia saner to gastric bypass Iron deficiency anemia secondary to menorrhagia  Current Therapy:   IV iron as indicated - last received in August2017 Vitamin B 12 1,000 mcg - self injects at home monthly as needed- managed by PCP    Interim History:  Megan Davila is here today for follow-up. She is symptomatic with fatigue and occasional weakness.  She had issues with lower left abdominal pain and states that had a UTI as well as a flare with IBS. She is now on Linzess and Bentyl which have helped with her IBS.  She has had no fever, chills, cough, rash, dizziness, SOB, chest pain, palpitations or changes in bladder habits.  Her cycle is irregular now and not heavy. She is followed regularly by her gynecologist.  She has occasional episodes of nausea and sometimes vomiting.  No episodes of bleeding, no bruising or petechiae.  No swelling, tenderness, numbness or tingling in her extremities.  No lymphadenopathy noted on exam.  She has started eating healthier and no longer drinks soft drinks. She is staying well hydrated. Her weight is stable.   ECOG Performance Status: 1 - Symptomatic but completely ambulatory  Medications:  Allergies as of 07/19/2018      Reactions   Naproxen Anaphylaxis   Monistat [miconazole] Other (See Comments)   "burning"   Sulfa Antibiotics Hives   Tioconazole Itching      Medication List        Accurate as of 07/19/18  2:42 PM. Always use your most recent med list.          ALPRAZolam 1 MG tablet Commonly known as:  XANAX Take 1 mg by mouth 2 (two) times daily as needed. Anxiety   Armodafinil 250 MG tablet Take 250 mg by mouth every morning.   cyanocobalamin 1000 MCG/ML injection Commonly known as:  (VITAMIN B-12) Monthly injection   DEXILANT 60 MG  capsule Generic drug:  dexlansoprazole Take 60 mg by mouth daily.   dicyclomine 10 MG capsule Commonly known as:  BENTYL TK 1 C PO TID   divalproex 500 MG DR tablet Commonly known as:  DEPAKOTE Take 1 tablet (500 mg total) by mouth 2 (two) times daily.   eletriptan 40 MG tablet Commonly known as:  RELPAX Take 1 tablet (40 mg total) by mouth as needed for migraine or headache. May repeat in 2 hours if needed. Max 2 tabs per day or 8 tabs per month.   ergocalciferol 50000 units capsule Commonly known as:  VITAMIN D2 Take 50,000 Units by mouth. She is taking daily M-F - pr pt   fexofenadine 180 MG tablet Commonly known as:  ALLEGRA Take 180 mg by mouth daily as needed. allergies   fluticasone 50 MCG/ACT nasal spray Commonly known as:  FLONASE Place 1 spray into both nostrils as needed.   imipramine 50 MG tablet Commonly known as:  TOFRANIL Take 2 tablets by mouth daily.   lansoprazole 30 MG capsule Commonly known as:  PREVACID Take 30 mg by mouth daily as needed. Acid reflux   LINZESS 72 MCG capsule Generic drug:  linaclotide Take 72 mcg by mouth as needed.   multivitamin with minerals tablet Take 1 tablet by mouth daily.   nystatin powder Commonly known as:  MYCOSTATIN/NYSTOP Apply topically 2 (two) times daily.   Polyethyl Glycol-Propyl Glycol 0.4-0.3 %  Soln Place 1 drop into both eyes as needed.   ranitidine 150 MG capsule Commonly known as:  ZANTAC   thiamine 50 MG tablet Commonly known as:  VITAMIN B-1 daily.   tiZANidine 4 MG capsule Commonly known as:  ZANAFLEX Take 2-4 mg by mouth at bedtime.   traMADol 50 MG tablet Commonly known as:  ULTRAM Take 50 mg by mouth every 8 (eight) hours as needed. for pain   venlafaxine XR 150 MG 24 hr capsule Commonly known as:  EFFEXOR-XR Take 1 capsule by mouth daily.   zonisamide 100 MG capsule Commonly known as:  ZONEGRAN Take 1 capsule (100 mg total) by mouth 2 (two) times daily.       Allergies:   Allergies  Allergen Reactions  . Naproxen Anaphylaxis  . Monistat [Miconazole] Other (See Comments)    "burning"   . Sulfa Antibiotics Hives  . Tioconazole Itching    Past Medical History, Surgical history, Social history, and Family History were reviewed and updated.  Review of Systems: All other 10 point review of systems is negative.   Physical Exam:  weight is 170 lb (77.1 kg). Her oral temperature is 98.7 F (37.1 C). Her blood pressure is 135/78 and her pulse is 95. Her respiration is 18 and oxygen saturation is 100%.   Wt Readings from Last 3 Encounters:  07/19/18 170 lb (77.1 kg)  06/11/18 178 lb (80.7 kg)  05/29/18 175 lb (79.4 kg)    Ocular: Sclerae unicteric, pupils equal, round and reactive to light Ear-nose-throat: Oropharynx clear, dentition fair Lymphatic: No cervical, supraclavicular or axillary adenopathy Lungs no rales or rhonchi, good excursion bilaterally Heart regular rate and rhythm, no murmur appreciated Abd soft, nontender, positive bowel sounds, no liver or spleen tip palpated on exam, no fluid wave  MSK no focal spinal tenderness, no joint edema Neuro: non-focal, well-oriented, appropriate affect Breasts: Deferred   Lab Results  Component Value Date   WBC 7.3 07/17/2018   HGB 14.7 07/17/2018   HCT 43.7 07/17/2018   MCV 97.1 07/17/2018   PLT 264 07/17/2018   Lab Results  Component Value Date   FERRITIN 490 (H) 07/17/2018   IRON 111 07/17/2018   TIBC 248 07/17/2018   UIBC 137 07/17/2018   IRONPCTSAT 45 07/17/2018   Lab Results  Component Value Date   RETICCTPCT 1.0 03/21/2018   RBC 4.50 07/17/2018   RETICCTABS 49.62 09/07/2016   No results found for: KPAFRELGTCHN, LAMBDASER, KAPLAMBRATIO No results found for: IGGSERUM, IGA, IGMSERUM No results found for: Odetta Pink, SPEI   Chemistry      Component Value Date/Time   NA 140 07/17/2018 1323   NA 137 10/18/2017 1630   NA 140  09/07/2016 1327   K 4.3 07/17/2018 1323   K 3.4 (L) 09/07/2016 1327   CL 103 07/17/2018 1323   CO2 25 07/17/2018 1323   CO2 22 09/07/2016 1327   BUN 13 07/17/2018 1323   BUN 18 10/18/2017 1630   BUN 13.3 09/07/2016 1327   CREATININE 0.86 07/17/2018 1323   CREATININE 0.70 12/29/2016 1652   CREATININE 0.8 09/07/2016 1327      Component Value Date/Time   CALCIUM 10.0 07/17/2018 1323   CALCIUM 8.2 (L) 09/07/2016 1327   ALKPHOS 88 07/17/2018 1323   ALKPHOS 54 09/07/2016 1327   AST 16 07/17/2018 1323   AST 11 09/07/2016 1327   ALT 36 07/17/2018 1323   ALT <9 09/07/2016 1327   BILITOT  0.5 07/17/2018 1323   BILITOT 0.41 09/07/2016 1327      Impression and Plan: Megan Davila is a very pleasant 52 yo caucasian female with 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 She is symptomatic as mentioned above. She received B 12 today.  Iron studies are stable so no IV iron needed at this time.  We will plan to see her back in another 4 months for follow-up.  She will contact our office with any questions or concerns. We can certainly see her sooner if need be.   Laverna Peace, NP 8/15/20192:42 PM

## 2018-07-23 DIAGNOSIS — R945 Abnormal results of liver function studies: Secondary | ICD-10-CM | POA: Diagnosis not present

## 2018-07-23 DIAGNOSIS — Z23 Encounter for immunization: Secondary | ICD-10-CM | POA: Diagnosis not present

## 2018-08-13 ENCOUNTER — Ambulatory Visit: Payer: 59 | Admitting: Obstetrics and Gynecology

## 2018-08-23 DIAGNOSIS — E559 Vitamin D deficiency, unspecified: Secondary | ICD-10-CM | POA: Diagnosis not present

## 2018-08-23 DIAGNOSIS — Z9884 Bariatric surgery status: Secondary | ICD-10-CM | POA: Diagnosis not present

## 2018-09-06 ENCOUNTER — Telehealth: Payer: Self-pay | Admitting: Diagnostic Neuroimaging

## 2018-09-06 DIAGNOSIS — G43109 Migraine with aura, not intractable, without status migrainosus: Secondary | ICD-10-CM | POA: Diagnosis not present

## 2018-09-06 MED ORDER — PROMETHAZINE HCL 12.5 MG PO TABS: 12.5000 mg | ORAL_TABLET | Freq: Three times a day (TID) | ORAL | 0 refills | Status: DC | PRN

## 2018-09-06 NOTE — Addendum Note (Signed)
Addended by: Trudie Buckler on: 09/06/2018 10:01 AM   Modules accepted: Orders

## 2018-09-06 NOTE — Telephone Encounter (Addendum)
Called patient who stated her migraine began yesterday afternoon. She is taking zonisamide and divalproex as Dr Leta Baptist ordered. Yesterday she took Relpax, got no relief, so she took a second one. She stated she still has a migraine. She also reported extreme nausea without vomiting. She is at work today, requesting to come in for a Depacon infusion. She would like something for nausea if she can get a driver.  This RN advised her Dr Leta Baptist is out of the office , will send this to NP who has seen patient as well. Patient requested to be called back on her cell #.  Discussed with NP who stated patient may come in for Depacon 500 mg IV, Compazine 10 mg if she has a driver. Per Otila Kluver RN, infusion suite there is an appointment opening 2-3 pm today. Called patient and informed her. She stated she will be here at 2 pm, uncertain if she will have driver. She is requesting a Rx for phenergan  which has been effective for her nausea in the past. This RN will let NP know of Rx request and infusion suite know of her coming in.  Infusion orders given to [employer].

## 2018-09-06 NOTE — Telephone Encounter (Signed)
Pt has called stating she has had a migraine since yesterday and the eletriptan (RELPAX) 40 MG tablet has not touched the migraines.  Pt is asking if she can come in for an infusion.  Please call

## 2018-09-10 MED ORDER — PREDNISONE 5 MG PO TABS: ORAL_TABLET | ORAL | 0 refills | Status: DC

## 2018-09-10 NOTE — Telephone Encounter (Signed)
Has she ever done a steriod taper? Can you call and ask. That would be my only other option at this point unless she went to the ED

## 2018-09-10 NOTE — Telephone Encounter (Signed)
Spoke with patient who stated she has taken steroids in the past but never for a headache. She is wanting to try steroids Rx to be sent to [facility]. She asked about Rx for nausea med. This RN advised her phenergan Rx was sent on 09/06/18. Advised she check on it when she picks up steroid.  She verbalized understanding, appreciation.

## 2018-09-10 NOTE — Telephone Encounter (Signed)
Patient called and stated that her migraine came back on Friday afternoon and she has been in bed all weekend with the migraine. She has taken her medicine and it has not helped. She would like to know how to proceed. Please call and advise.

## 2018-09-10 NOTE — Addendum Note (Signed)
Addended by: Trudie Buckler on: 09/10/2018 10:52 AM   Modules accepted: Orders

## 2018-09-11 ENCOUNTER — Encounter: Payer: Self-pay | Admitting: Diagnostic Neuroimaging

## 2018-09-11 DIAGNOSIS — G43909 Migraine, unspecified, not intractable, without status migrainosus: Secondary | ICD-10-CM | POA: Diagnosis not present

## 2018-09-11 DIAGNOSIS — R58 Hemorrhage, not elsewhere classified: Secondary | ICD-10-CM | POA: Diagnosis not present

## 2018-09-11 NOTE — Telephone Encounter (Signed)
Pt has asked RN Verneita Griffes calls her re: migraine she has had since last Thursday

## 2018-09-11 NOTE — Telephone Encounter (Signed)
I have tired everything we can do at the office. If her headache is debilitating she may need to consider going to ED or urgent care.

## 2018-09-11 NOTE — Telephone Encounter (Addendum)
Spoke with patient who stated she started steroids yesterday, and has taken them today. She continues her other migraine medications and has taken eletriptan 2 tabs daily with no relief. She stated her headache is no better and is "debilitating".  Her nausea is under control with phenergan. She is asking what else she can do, and if she can come in for a "shot".  This RN advised will discuss with NP and call her back, however she may have to give steroids more time to be effective. She verbalized understanding, appreciation.

## 2018-09-11 NOTE — Telephone Encounter (Signed)
Spoke with patient and advised her of NP's message. She asked for recommendation and what the ED or urgent care would do. This RN advised she will not make a recommendation, and she would have to decide based on how she feels. Advised her that it would be expected that the steroids will make her feel better soon. Advised her the urgent care or ED may give injection, but she would have to ask for particular treatments they may offer. She verbalized understanding, appreciation of call back.

## 2018-09-12 ENCOUNTER — Ambulatory Visit: Payer: 59 | Admitting: Obstetrics and Gynecology

## 2018-09-12 ENCOUNTER — Telehealth: Payer: Self-pay | Admitting: Obstetrics and Gynecology

## 2018-09-12 ENCOUNTER — Emergency Department (HOSPITAL_COMMUNITY)
Admission: EM | Admit: 2018-09-12 | Discharge: 2018-09-12 | Disposition: A | Discharge status: Home / Self Care | Payer: 59 | Attending: Emergency Medicine | Admitting: Emergency Medicine

## 2018-09-12 ENCOUNTER — Encounter (HOSPITAL_COMMUNITY): Payer: Self-pay | Admitting: *Deleted

## 2018-09-12 DIAGNOSIS — G43909 Migraine, unspecified, not intractable, without status migrainosus: Secondary | ICD-10-CM | POA: Insufficient documentation

## 2018-09-12 DIAGNOSIS — Z5321 Procedure and treatment not carried out due to patient leaving prior to being seen by health care provider: Secondary | ICD-10-CM | POA: Insufficient documentation

## 2018-09-12 NOTE — Telephone Encounter (Signed)
Please place patient in pap recall for November 2019.  Ok to then close encounter.  [place]

## 2018-09-12 NOTE — Telephone Encounter (Signed)
Pap recall entered by Estill Bamberg, Dr. Elza Rafter medical assistant. Closing encounter per Estill Bamberg.

## 2018-09-12 NOTE — Telephone Encounter (Signed)
Patient called and cancelled her 3 month pap appointment for today due to having a migraine. She will call back to reschedule.

## 2018-09-12 NOTE — ED Triage Notes (Signed)
Pt in c/o migraine since Wednesday, seen at her neurologist and given a depacon infusion on Thursday which did help but by Friday symptoms had returned, sent here for further control

## 2018-09-13 ENCOUNTER — Encounter (HOSPITAL_BASED_OUTPATIENT_CLINIC_OR_DEPARTMENT_OTHER): Payer: Self-pay | Admitting: *Deleted

## 2018-09-13 ENCOUNTER — Emergency Department (HOSPITAL_BASED_OUTPATIENT_CLINIC_OR_DEPARTMENT_OTHER)
Admission: EM | Admit: 2018-09-13 | Discharge: 2018-09-13 | Disposition: A | Discharge status: Home / Self Care | Payer: 59 | Attending: Emergency Medicine | Admitting: Emergency Medicine

## 2018-09-13 ENCOUNTER — Other Ambulatory Visit: Payer: Self-pay

## 2018-09-13 DIAGNOSIS — R51 Headache: Secondary | ICD-10-CM | POA: Diagnosis not present

## 2018-09-13 DIAGNOSIS — G43909 Migraine, unspecified, not intractable, without status migrainosus: Secondary | ICD-10-CM | POA: Insufficient documentation

## 2018-09-13 DIAGNOSIS — Z79899 Other long term (current) drug therapy: Secondary | ICD-10-CM | POA: Insufficient documentation

## 2018-09-13 DIAGNOSIS — G43709 Chronic migraine without aura, not intractable, without status migrainosus: Secondary | ICD-10-CM | POA: Diagnosis not present

## 2018-09-13 MED ORDER — DIPHENHYDRAMINE HCL 25 MG PO CAPS
25.0000 mg | ORAL_CAPSULE | Freq: Once | ORAL | Status: AC
Start: 2018-09-13 — End: 2018-09-13
  Administered 2018-09-13: 25 mg via ORAL
  Filled 2018-09-13: qty 1

## 2018-09-13 MED ORDER — ACETAMINOPHEN 500 MG PO TABS
500.0000 mg | ORAL_TABLET | Freq: Once | ORAL | Status: AC
  Administered 2018-09-13: 500 mg via ORAL
  Filled 2018-09-13: qty 1

## 2018-09-13 MED ORDER — BUTALBITAL-APAP-CAFFEINE 50-325-40 MG PO TABS
1.0000 | ORAL_TABLET | Freq: Once | ORAL | Status: DC
  Filled 2018-09-13: qty 1

## 2018-09-13 MED ORDER — METOCLOPRAMIDE HCL 10 MG PO TABS
10.0000 mg | ORAL_TABLET | Freq: Once | ORAL | Status: AC
  Administered 2018-09-13: 10 mg via ORAL
  Filled 2018-09-13: qty 1

## 2018-09-13 NOTE — ED Notes (Signed)
Pt reports nausea continues.

## 2018-09-13 NOTE — Telephone Encounter (Signed)
Pt called stating that her migraine has not gotten better, requesting a call to discuss what to do to help maintain her pain. Please advise

## 2018-09-13 NOTE — ED Notes (Signed)
Pt/family verbalized understanding of discharge instructions.   

## 2018-09-13 NOTE — ED Triage Notes (Signed)
Pt reports her usual migraine ha x last Wednesday. Called her neuro on Thursday and was given and infusion, pt states "it got a little better, but then came back." pt states she was seen at an urgent care on Tuesday, given zofran and benadryl im, some relief then pain returned again. Pt states her pain is now worse. This am took relpax with no relief.

## 2018-09-13 NOTE — Discharge Instructions (Signed)
Please return to the Emergency Department for any new or worsening symptoms or if your symptoms do not improve. Please be sure to follow up with your Primary Care Physician as soon as possible regarding your visit today. If you do not have a Primary Doctor please use the resources below to establish one. Please call your neurologist office to schedule a follow-up appointment as soon as possible. Please continue to take the medications prescribed by her neurologist.  Contact a health care provider if: You develop symptoms that are different or more severe than your usual migraine symptoms. Get help right away if: Your migraine becomes severe. You have a fever. You have a stiff neck. You have vision loss. Your muscles feel weak or like you cannot control them. You start to lose your balance often. You develop trouble walking. You faint.  Do not take your medicine if  develop an itchy rash, swelling in your mouth or lips, or difficulty breathing.   RESOURCE GUIDE  Chronic Pain Problems: Contact [facility]  [phone] Patients need to be referred by their primary care doctor.  Insufficient Money for Medicine: Contact [employer]:  call "211" or [place] [phone].  No Primary Care Doctor: Call [facility]  [phone] - can help you locate a primary care doctor that  accepts your insurance, provides certain services, etc. Physician Referral Service- [phone]  Agencies that provide inexpensive medical care: [facility]  [facility]  [phone] [facility]  [phone] [facility]  [phone] Planned [facility]  [phone] [facility]  [phone]  [place] Providers: [facility]- [address], [address]  [phone], Mon-Fri 9am-7pm, Sat 9am-1pm [place], [address], [address] [place]  [place]- [address]  [place], [address], [phone]  Only accepts [place] patients after they have their name  applied to their card  Self Pay (no insurance) in [facility]: Sickle Cell Patients: Dr Kevan Ny, [facility]  [place], [facility]- [place]  [place] [place]- [address], [facility]- see information above (Speak to [employer] if you do not have insurance)       -  [facility]- [place], [place],  [place]       -  Elfrida Gleed, [place]  Dr Vista Lawman-  [address], [address], [place], [place] [facility]- [address] [phone]       -  [facility]- [address], [place], also [address] [phone]       -    [facility]- [address], [place], 1st & 3rd Saturday   every month, 10am-1pm  1) Find a Doctor and Pay Out of Pocket Although you won't have to find out who is covered by your insurance  plan, it is a good idea to ask around and get recommendations. You will then need to call the office and see if the doctor you have chosen will accept you as a new patient and what types of options they offer for patients who are self-pay. Some doctors offer discounts or will set up payment plans for their patients who do not have insurance, but you will need to ask so you aren't surprised when you get to your appointment.  2) Contact Your Local Health Department Not all health departments have doctors that can see patients for sick visits, but many do, so it is worth a call to see if yours does. If you don't know where your local  health department is, you can check in your phone book. The CDC also has a tool to help you locate your state's health department, and many state websites also have listings of all of their local health departments.  3) Find a [facility] If your illness is not likely to be very severe or complicated, you may want to try a walk in clinic. These are popping up all over the country in pharmacies, drugstores, and shopping centers. They're usually staffed by nurse practitioners or physician assistants that have been trained to treat common illnesses and complaints. They're usually fairly quick and inexpensive. However, if you have serious medical issues or chronic medical problems, these are probably not your best option  [facility] [place], [facility], [address] [place], phone [phone] or [phone].  Monday - Friday, call for an appointment. [place], [facility], [address], [place], phone [phone] or [phone].  Monday - Friday, call for an appointment.  Abuse/Neglect: [place] [phone] [place] [phone] (After Hours)  Emergency Shelter:  [facility] [phone]  Maternity Homes: Room at the [place] [phone] [place] [phone]  MRSA Hotline #:   902-714-3814  [facility] Dept. [address]         [place] Phone:  [phone]                                  Phone:  [phone]                   Phone:  [place], [place] in [place], [address],  [phone], [place] [phone] or [phone] (After Hours)   [place]  Substance Abuse Resources: Alcohol and Drug Services  [place] [phone] [facility] Chinita Pester [phone] Residential & Outpatient Substance Abuse Program  [phone]  Psychological Services: [place]  [phone] [place]  [place], [place] [address] [place], [place]: [phone] or [phone], [employer]  Dental Assistance  If unable to pay or uninsured, contact:  [facility] or [facility]. to become qualified for the adult dental clinic.  Patients with Medicaid: [facility] [phone] [address], [place] [address], [phone]  If unable to pay, or uninsured, contact [facility] [phone]) or [employer] [phone] in [place], [place] in [facility]) to become qualified for the adult dental clinic   Other [place]- [place], [place], [place], [address], [place], [place], 2nd and 4th Thursday of the month at 6:30am.  10 clients each day by appointment, can sometimes see walk-in patients if someone does not show for an appointment. [facility]- [address] Hillard Danker [place], [place], [address], [place], [place], [place], [address], [place] Department- [phone] [place] [facility] [phone]

## 2018-09-13 NOTE — ED Provider Notes (Signed)
[facility] EMERGENCY DEPARTMENT Provider Note   CSN: [id number] Arrival date & time: 09/13/18  2458     History   Chief Complaint Chief Complaint  Patient presents with  . Migraine    HPI Megan Davila is a 52 y.o. female with history of migraines presenting for 8 days of her usual migraine.  Patient describes her headache as a throbbing severe pain behind both of her eyes that has been constant for the past 8 days despite treatment.  Patient states that her headaches are worsened with light and loud noises.  Patient states that this feels like her usual migraine with no abnormal features.  Patient states that this migraine began on Wednesday night of last week.  She was seen and evaluated by her neurologist on Thursday and given a Depacon infusion.  She said that this helped her migraine for that day however the migraine returned on Friday evening and she has not been able to follow-up with the neurologist since.  She states that the migraine has been constant since Friday.  Patient has been placed on a taper dose of prednisone starting at 60 mg on Monday, 50 Tuesday, 40 Wednesday and 30 today.  Patient with 2 days left.  Patient states that the prednisone has been of minimal relief.  Additionally patient was seen at an urgent care on Tuesday and given Zofran and Benadryl with little relief of her pain.  Patient denies vomiting however endorses nausea.  Patient denies fever, vision changes, neck pain, chest pain or abdominal pain.  Patient denies visual changes.  HPI  Past Medical History:  Diagnosis Date  . Abnormal Pap smear   . Anemia   . Anxiety   . Broken toe 12-22-15   middle toe right foot  . Cataract of both eyes   . Depression   . GERD (gastroesophageal reflux disease)   . Headache(784.0)   . Heart murmur   . History of kidney stones   . 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 Perforated bowel ([place])   . Pernicious anemia 06/19/2015    . PONV (postoperative nausea and vomiting)   . Thyroid disease    hypothyrodism    Patient Active Problem List   Diagnosis Date Noted  . Vitamin D deficiency 05/23/2017  . Anxiety 06/19/2015  . Anemia, iron deficiency 06/19/2015  . Pernicious anemia 06/19/2015  . 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 Protein-calorie malnutrition, severe ([place]) 04/18/2015  . Severe protein-calorie malnutrition ([place]) 04/18/2015  . 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 Bariatric surgery status 04/18/2015  . 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 GERD (gastroesophageal reflux disease) 04/17/2015  . Hypokalemia 04/16/2015  . Migraine with aura 05/21/2014  . 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 CATARACT EXTRACTION, BILATERAL Bilateral 02/2016  . CERVICAL BIOPSY  W/ LOOP ELECTRODE EXCISION  11/2013   LGSIL  . 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 CESAREAN SECTION  2008  . CHOLECYSTECTOMY    . GASTRIC BYPASS  2002  . HYSTEROSCOPY W/D&C  10/14/2011   Procedure: DILATATION AND CURETTAGE (D&C) /HYSTEROSCOPY;  Surgeon: Arloa Koh;  Location: [facility];  Service: Gynecology;  Laterality: Bilateral;  . LASIK    . NOSE SURGERY    . STOMACH SURGERY  04/20/2015   dilation of "connection between stomach and intestine", [facility]  . TONSILLECTOMY    . TONSILLECTOMY    .  TUBAL LIGATION  2008     OB History    Gravida  1   Para  1   Term      Preterm      AB      Living  1     SAB      TAB      Ectopic      Multiple  1   Live Births               Home Medications    Prior to Admission medications   Medication Sig Start Date End Date Taking? Authorizing Provider  ALPRAZolam Duanne Moron) 1 MG tablet Take 1 mg by mouth 2 (two) times daily as needed. Anxiety      [provider]  Armodafinil 250 MG tablet  Take 250 mg by mouth every morning. 05/24/18   [provider]  cyanocobalamin (,VITAMIN B-12,) 1000 MCG/ML injection Monthly injection 09/03/15   [provider]  DEXILANT 60 MG capsule Take 60 mg by mouth daily. 02/25/15   [provider]  dicyclomine (BENTYL) 10 MG capsule TK 1 C PO TID 06/08/18   [provider]  divalproex (DEPAKOTE) 500 MG DR tablet Take 1 tablet (500 mg total) by mouth 2 (two) times daily. 04/23/18   Penumalli, Earlean Polka, MD  eletriptan (RELPAX) 40 MG tablet Take 1 tablet (40 mg total) by mouth as needed for migraine or headache. May repeat in 2 hours if needed. Max 2 tabs per day or 8 tabs per month. 04/23/18   Penumalli, Earlean Polka, MD  ergocalciferol (VITAMIN D2) 50000 UNITS capsule Take 50,000 Units by mouth. She is taking daily M-F - pr pt    [provider]  fexofenadine (ALLEGRA) 180 MG tablet Take 180 mg by mouth daily as needed. allergies     [provider]  fluticasone (FLONASE) 50 MCG/ACT nasal spray Place 1 spray into both nostrils as needed. 01/17/17   [provider]  imipramine (TOFRANIL) 50 MG tablet Take 2 tablets by mouth daily. 03/28/18   [provider]  lansoprazole (PREVACID) 30 MG capsule Take 30 mg by mouth daily as needed. Acid reflux      [provider]  linaclotide (LINZESS) 72 MCG capsule Take 72 mcg by mouth as needed.    [provider]  Multiple Vitamins-Minerals (MULTIVITAMIN WITH MINERALS) tablet Take 1 tablet by mouth daily.      [provider]  nystatin (NYSTATIN) powder Apply topically 2 (two) times daily. 07/08/16   [place], Holli Humbles, NP  Polyethyl Glycol-Propyl Glycol 0.4-0.3 % SOLN Place 1 drop into both eyes as needed.    [provider]  predniSONE (DELTASONE) 5 MG tablet Begin taking 6 tablets daily, taper by one tablet daily until off the medication. 09/10/18   Ward Givens, NP  promethazine (PHENERGAN) 12.5 MG tablet Take 1 tablet  (12.5 mg total) by mouth every 8 (eight) hours as needed for nausea or vomiting. 09/06/18   Ward Givens, NP  ranitidine (ZANTAC) 150 MG capsule  05/31/15   [provider]  thiamine (VITAMIN B-1) 50 MG tablet daily. 08/07/15   [provider]  tiZANidine (ZANAFLEX) 4 MG capsule Take 2-4 mg by mouth at bedtime. 05/22/18   [provider]  traMADol (ULTRAM) 50 MG tablet Take 50 mg by mouth every 8 (eight) hours as needed. for pain 06/04/18   [provider]  venlafaxine XR (EFFEXOR-XR) 150 MG 24 hr capsule Take 1 capsule  by mouth daily. 01/28/18   [provider]  zonisamide (ZONEGRAN) 100 MG capsule Take 1 capsule (100 mg total) by mouth 2 (two) times daily. 04/23/18   Penumalli, Earlean Polka, MD    Family History Family History  Problem Relation Age of Onset  . Diabetes Mother   . Heart disease Father   . Social phobia Father   . Psychiatric Illness Father   . Hypertension Father   . Stroke Father   . Dementia Father   . Heart disease Unknown   . Diabetes Unknown   . Stroke Unknown   . Hypertension Maternal Grandmother   . Thyroid disease Maternal Grandmother   . Hypertension Paternal Grandmother   . Stroke Paternal Grandfather   . Breast cancer Neg Hx     Social History Social History   Tobacco Use  . Smoking status: Never Smoker  . Smokeless tobacco: Never Used  Substance Use Topics  . Alcohol use: Yes    Alcohol/week: 0.0 standard drinks    Comment: Once a month-rarely  . Drug use: No     Allergies   Naproxen; Monistat [miconazole]; Sulfa antibiotics; and Tioconazole   Review of Systems Review of Systems  Constitutional: Negative.  Negative for chills and fever.  HENT: Negative.  Negative for rhinorrhea and sore throat.   Eyes: Negative.  Negative for visual disturbance.  Respiratory: Negative.  Negative for shortness of breath.   Cardiovascular: Negative.  Negative for chest pain.  Gastrointestinal: Positive for nausea.  Negative for abdominal pain, diarrhea and vomiting.  Genitourinary: Negative.  Negative for dysuria and hematuria.  Musculoskeletal: Negative.  Negative for neck pain and neck stiffness.  Skin: Negative.  Negative for rash.  Neurological: Positive for headaches. Negative for dizziness, syncope, weakness and numbness.     Physical Exam Updated Vital Signs BP 112/71   Pulse 77   Temp 97.6 F (36.4 C) (Oral)   Resp 16   Ht 5\' 3"  (1.6 m)   Wt 70.3 kg   SpO2 100%   BMI 27.46 kg/m   Physical Exam  Constitutional: She appears well-developed and well-nourished. No distress.  HENT:  Head: Normocephalic and atraumatic. Head is without raccoon's eyes and without Battle's sign.  Right Ear: External ear normal.  Left Ear: External ear normal.  Nose: Nose normal.  Mouth/Throat: Oropharynx is clear and moist.  Nontender scalp.  Eyes: Pupils are equal, round, and reactive to light. Conjunctivae and EOM are normal.  Neck: Trachea normal, normal range of motion, full passive range of motion without pain and phonation normal. Neck supple. No tracheal deviation present. No Brudzinski's sign noted.  Cardiovascular: Normal rate, regular rhythm, normal heart sounds and intact distal pulses.  Pulmonary/Chest: Effort normal. No respiratory distress.  Abdominal: Soft. There is no tenderness. There is no rigidity, no rebound and no guarding.  Musculoskeletal: Normal range of motion.  Neurological: She is alert. She has normal strength. No cranial nerve deficit or sensory deficit. She displays a negative Romberg sign. GCS eye subscore is 4. GCS verbal subscore is 5. GCS motor subscore is 6.  Mental Status: Alert, oriented, thought content appropriate, able to give a coherent history. Speech fluent without evidence of aphasia. Able to follow 2 step commands without difficulty. Cranial Nerves: II: Peripheral visual fields grossly normal, pupils equal, round, reactive to light III,IV, VI: ptosis not  present, extra-ocular motions intact bilaterally V,VII: smile symmetric, eyebrows raise symmetric, facial light touch sensation equal VIII: hearing grossly normal to voice X: uvula  elevates symmetrically XI: bilateral shoulder shrug symmetric and strong XII: midline tongue extension without fassiculations Motor: Normal tone. 5/5 strength in upper and lower extremities bilaterally including strong and equal grip strength and dorsiflexion/plantar flexion Sensory: Sensation intact to light touch in all extremities. Cerebellar: normal finger-to-nose with bilateral upper extremities. Normal heel-to -shin balance bilaterally of the lower extremity. No pronator drift.  CV: distal pulses palpable throughout Normal gait and balance.  Skin: Skin is warm and dry. Capillary refill takes less than 2 seconds.  Psychiatric: She has a normal mood and affect. Her behavior is normal.    ED Treatments / Results  Labs (all labs ordered are listed, but only abnormal results are displayed) Labs Reviewed - No data to display  EKG None  Radiology No results found.  Procedures Procedures (including critical care time)  Medications Ordered in ED Medications  diphenhydrAMINE (BENADRYL) capsule 25 mg (25 mg Oral Given 09/13/18 1035)  metoCLOPramide (REGLAN) tablet 10 mg (10 mg Oral Given 09/13/18 1035)  acetaminophen (TYLENOL) tablet 500 mg (500 mg Oral Given 09/13/18 1036)    Initial Impression / Assessment and Plan / ED Course  I have reviewed the triage vital signs and the nursing notes.  Pertinent labs & imaging results that were available during my care of the patient were reviewed by me and considered in my medical decision making (see chart for details).  Clinical Course as of Sep 13 2037  Thu Sep 13, 2018  1019 Patient refuses IV treatment; requesting PO treatment.   [BM]  0539 Patient reassessed, resting comfortably in room no acute distress.  Requesting discharge.  States headache is  mildly improved.   [BM]  1220 Patient at nurses station requesting discharge at this time.   [BM]    Clinical Course User Index [BM] Deliah Boston, PA-C   DEMA TIMMONS is a 52 y.o. female who presents to ED for migraine headache c/w their typical migraine. No focal neuro deficits on exam.  Patient refused IV fluids and treatment today requesting p.o. medications.  Tylenol, Reglan and Benadryl given p.o. in emergency department with mild relief.  Patient requesting discharge at this time, mother driving her home.  On re-evaluation, patient well appearing and in no acute distress sitting with her mother and requesting discharge. The patient denies any neurologic symptoms such as visual changes, focal numbness/weakness, balance problems, confusion, or speech difficulty to suggest a life-threatening intracranial process such as intracranial hemorrhage or mass. The patient has no clotting risk factors thus venous sinus thrombosis is unlikely. No fevers, neck pain or nuchal rigidity to suggest meningitis. Patient is afebrile, non-toxic and well appearing. Reassuring neuro exam, normal gait to the bathroom and back. No cranial deficits, no speech deficits, negative pronator drift, normal/equal strength to all extremities.   I feel that the patient is safe for discharge home at this time. Neurology and PCP follow up strongly encouraged. I have reviewed return precautions including development of fever, nausea/vomiting or neurologic symptoms, vision changes, confusion, lethargy, difficulty speaking/walking, or other new/worsening/concerning symptoms. Patient states understanding of return precautions. Patient is agreeable to discharge. All questions answered.  At this time there does not appear to be any evidence of an acute emergency medical condition and the patient appears stable for discharge with appropriate outpatient follow up. Diagnosis was discussed with patient who verbalizes understanding of  care plan and is agreeable to discharge. I have discussed return precautions with patient and mother who verbalize understanding of return precautions. Patient strongly  encouraged to follow-up with their PCP. All questions answered.  Patient's case discussed with Dr. Melina Copa who agrees with plan to discharge with follow-up.     Note: Portions of this report may have been transcribed using voice recognition software. Every effort was made to ensure accuracy; however, inadvertent computerized transcription errors may still be present.  Final Clinical Impressions(s) / ED Diagnoses   Final diagnoses:  Migraine without status migrainosus, not intractable, unspecified migraine type    ED Discharge Orders    None       Gari Crown 09/13/18 2038    Hayden Rasmussen, MD 09/14/18 [phone]

## 2018-09-13 NOTE — Telephone Encounter (Addendum)
I spoke to pt .   She is day 3 of prednisone, no changes.  She has Level 5 migraine, with tinnitis, nausea.  [facility] and offered [facility] refused.  Gave her tylenol, benadryl, zofran this am.  She states she will do whatever she needs to do to get rid of the migraine.  Please advise.  I told her if she get so bad she needs to go to ED again.  It may be tomorrow that I return call.  She verbalized understanding.

## 2018-09-14 ENCOUNTER — Emergency Department (HOSPITAL_COMMUNITY)
Admission: EM | Admit: 2018-09-14 | Discharge: 2018-09-14 | Disposition: A | Discharge status: Home / Self Care | Payer: 59 | Attending: Emergency Medicine | Admitting: Emergency Medicine

## 2018-09-14 DIAGNOSIS — E039 Hypothyroidism, unspecified: Secondary | ICD-10-CM | POA: Diagnosis not present

## 2018-09-14 DIAGNOSIS — R51 Headache: Secondary | ICD-10-CM | POA: Diagnosis not present

## 2018-09-14 DIAGNOSIS — R69 Illness, unspecified: Secondary | ICD-10-CM | POA: Diagnosis not present

## 2018-09-14 DIAGNOSIS — Z79899 Other long term (current) drug therapy: Secondary | ICD-10-CM | POA: Diagnosis not present

## 2018-09-14 DIAGNOSIS — G43909 Migraine, unspecified, not intractable, without status migrainosus: Secondary | ICD-10-CM | POA: Diagnosis not present

## 2018-09-14 DIAGNOSIS — R519 Headache, unspecified: Secondary | ICD-10-CM

## 2018-09-14 MED ORDER — SODIUM CHLORIDE 0.9 % IV BOLUS
1000.0000 mL | Freq: Once | INTRAVENOUS | Status: AC
  Administered 2018-09-14: 1000 mL via INTRAVENOUS

## 2018-09-14 MED ORDER — METOCLOPRAMIDE HCL 5 MG/ML IJ SOLN
10.0000 mg | Freq: Once | INTRAMUSCULAR | Status: AC
  Administered 2018-09-14: 10 mg via INTRAVENOUS
  Filled 2018-09-14: qty 2

## 2018-09-14 MED ORDER — DIPHENHYDRAMINE HCL 50 MG/ML IJ SOLN
25.0000 mg | Freq: Once | INTRAMUSCULAR | Status: AC
  Administered 2018-09-14: 25 mg via INTRAVENOUS
  Filled 2018-09-14: qty 1

## 2018-09-14 NOTE — ED Provider Notes (Signed)
[facility] DEPT Provider Note   CSN: [id number] Arrival date & time: 09/14/18  1600     History   Chief Complaint Chief Complaint  Patient presents with  . Migraine    HPI Megan Davila is a 52 y.o. female.  52 y.o female with a PMH of Migraines presents to the ED with a chief complaint of migraine x 1 week.She reports her headache is behind her eye region, just like her typical migraine. Patient reports she was seen by her neurologist a week ago and given a depacon infusion, she states this relieved the pain for an hour but later returned. She called him and he placed heron steroids,patient has been taking these but has 1 dose left.  She reports she was seen at [facility] on Tuesday where she received some p.o. Benadryl and Zofran which improved her headache.  She also went to [facility] Wednesday but left without being seen as the wait was long.  She was also seen on Thursday at [facility] where they gave her Benadryl and Compazine which improved her headache.  Patient called her neurologist today who told her to come into the ED in order to get fluids and migraine medication.  She also reports some photophobia, phonophobia, nauseated.  Patient reports the noise from the clock in the room is making her symptoms worse.  She denies any vomiting,dizziness, neck pain or fevers.      Past Medical History:  Diagnosis Date  . Abnormal Pap smear   . Anemia   . Anxiety   . Broken toe 12-22-15   middle toe right foot  . Cataract of both eyes   . Depression   . GERD (gastroesophageal reflux disease)   . Headache(784.0)   . Heart murmur   . History of kidney stones   . 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 Perforated bowel ([place])   . Pernicious anemia 06/19/2015  . PONV (postoperative nausea and vomiting)   . Thyroid disease    hypothyrodism    Patient Active Problem List   Diagnosis Date Noted  . Vitamin D  deficiency 05/23/2017  . Anxiety 06/19/2015  . Anemia, iron deficiency 06/19/2015  . Pernicious anemia 06/19/2015  . 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 Protein-calorie malnutrition, severe ([place]) 04/18/2015  . Severe protein-calorie malnutrition ([place]) 04/18/2015  . 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 Bariatric surgery status 04/18/2015  . 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 GERD (gastroesophageal reflux disease) 04/17/2015  . Hypokalemia 04/16/2015  . Migraine with aura 05/21/2014  . 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 CATARACT EXTRACTION, BILATERAL Bilateral 02/2016  . CERVICAL BIOPSY  W/ LOOP ELECTRODE EXCISION  11/2013   LGSIL  . 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 CESAREAN SECTION  2008  . CHOLECYSTECTOMY    . GASTRIC BYPASS  2002  . HYSTEROSCOPY W/D&C  10/14/2011   Procedure: DILATATION AND CURETTAGE (D&C) /HYSTEROSCOPY;  Surgeon: Arloa Koh;  Location: [facility];  Service: Gynecology;  Laterality: Bilateral;  . LASIK    . NOSE SURGERY    . STOMACH SURGERY  04/20/2015   dilation of "connection between stomach and intestine", [facility]  . TONSILLECTOMY    . TONSILLECTOMY    . TUBAL LIGATION  2008     OB History    Gravida  1   Para  1   Term      Preterm      AB      Living  1     SAB      TAB      Ectopic      Multiple  1   Live Births               Home Medications    Prior to Admission medications   Medication Sig Start Date End Date Taking? Authorizing Provider  ALPRAZolam Duanne Moron) 1 MG tablet Take 1 mg by mouth 2 (two) times daily as needed. Anxiety      [provider]  Armodafinil 250 MG tablet Take 250 mg by mouth every morning. 05/24/18   [provider]  cyanocobalamin (,VITAMIN B-12,) 1000 MCG/ML injection Monthly injection 09/03/15    [provider]  DEXILANT 60 MG capsule Take 60 mg by mouth daily. 02/25/15   [provider]  dicyclomine (BENTYL) 10 MG capsule TK 1 C PO TID 06/08/18   [provider]  divalproex (DEPAKOTE) 500 MG DR tablet Take 1 tablet (500 mg total) by mouth 2 (two) times daily. 04/23/18   Penumalli, Earlean Polka, MD  eletriptan (RELPAX) 40 MG tablet Take 1 tablet (40 mg total) by mouth as needed for migraine or headache. May repeat in 2 hours if needed. Max 2 tabs per day or 8 tabs per month. 04/23/18   Penumalli, Earlean Polka, MD  ergocalciferol (VITAMIN D2) 50000 UNITS capsule Take 50,000 Units by mouth. She is taking daily M-F - pr pt    [provider]  fexofenadine (ALLEGRA) 180 MG tablet Take 180 mg by mouth daily as needed. allergies     [provider]  fluticasone (FLONASE) 50 MCG/ACT nasal spray Place 1 spray into both nostrils as needed. 01/17/17   [provider]  imipramine (TOFRANIL) 50 MG tablet Take 2 tablets by mouth daily. 03/28/18   [provider]  lansoprazole (PREVACID) 30 MG capsule Take 30 mg by mouth daily as needed. Acid reflux      [provider]  linaclotide (LINZESS) 72 MCG capsule Take 72 mcg by mouth as needed.    [provider]  LINZESS 145 MCG CAPS capsule Take 145 mcg by mouth daily. 09/03/18   [provider]  Multiple Vitamins-Minerals (MULTIVITAMIN WITH MINERALS) tablet Take 1 tablet by mouth daily.      [provider]  nystatin (NYSTATIN) powder Apply topically 2 (two) times daily. 07/08/16   [place], Holli Humbles, NP  Polyethyl Glycol-Propyl Glycol 0.4-0.3 % SOLN Place 1 drop into both eyes as needed.    [provider]  predniSONE (DELTASONE) 5 MG tablet Begin taking 6 tablets daily, taper by one tablet daily until off the medication. 09/10/18   Ward Givens, NP  promethazine (PHENERGAN) 12.5 MG tablet Take 1 tablet (12.5 mg total) by mouth every 8 (eight) hours as needed for  nausea or vomiting. 09/06/18   Ward Givens, NP  ranitidine (ZANTAC) 150 MG capsule  05/31/15   [provider]  thiamine (VITAMIN B-1) 50 MG tablet daily. 08/07/15   [provider]  tiZANidine (ZANAFLEX) 4 MG capsule Take 2-4 mg by mouth at bedtime. 05/22/18   [provider]  tiZANidine (ZANAFLEX) 4 MG tablet Take 2-4 mg by mouth every 8 (eight) hours as needed. 08/24/18   [provider]  traMADol (ULTRAM) 50 MG tablet Take 50 mg by mouth every 8 (eight) hours as  needed. for pain 06/04/18   [provider]  venlafaxine XR (EFFEXOR-XR) 150 MG 24 hr capsule Take 1 capsule by mouth daily. 01/28/18   [provider]  WELLBUTRIN XL 150 MG 24 hr tablet Take 150 mg by mouth daily. 09/07/18   [provider]  zonisamide (ZONEGRAN) 100 MG capsule Take 1 capsule (100 mg total) by mouth 2 (two) times daily. 04/23/18   Penumalli, Earlean Polka, MD    Family History Family History  Problem Relation Age of Onset  . Diabetes Mother   . Heart disease Father   . Social phobia Father   . Psychiatric Illness Father   . Hypertension Father   . Stroke Father   . Dementia Father   . Heart disease Unknown   . Diabetes Unknown   . Stroke Unknown   . Hypertension Maternal Grandmother   . Thyroid disease Maternal Grandmother   . Hypertension Paternal Grandmother   . Stroke Paternal Grandfather   . Breast cancer Neg Hx     Social History Social History   Tobacco Use  . Smoking status: Never Smoker  . Smokeless tobacco: Never Used  Substance Use Topics  . Alcohol use: Yes    Alcohol/week: 0.0 standard drinks    Comment: Once a month-rarely  . Drug use: No     Allergies   Naproxen; Monistat [miconazole]; Sulfa antibiotics; and Tioconazole   Review of Systems Review of Systems  HENT: Negative for sore throat.   Eyes: Positive for photophobia.  Respiratory: Negative for shortness of breath.   Cardiovascular: Negative for chest pain.    Gastrointestinal: Positive for nausea. Negative for abdominal pain, diarrhea and vomiting.  Genitourinary: Negative for dysuria and flank pain.  Musculoskeletal: Negative for back pain.  Skin: Negative for pallor and wound.  Neurological: Positive for headaches. Negative for light-headedness.  All other systems reviewed and are negative.    Physical Exam Updated Vital Signs BP 101/63   Pulse 85   Temp 97.6 F (36.4 C) (Oral)   Resp 14   SpO2 100%   Physical Exam  Constitutional: She is oriented to person, place, and time. She appears well-developed and well-nourished.  HENT:  Head: Normocephalic and atraumatic.  No scalp tenderness, no head tenderness.  Neck: Normal range of motion. Neck supple.  No neck pain or neck rigidity.   Cardiovascular: Normal heart sounds.  Pulmonary/Chest: Effort normal and breath sounds normal. She has no wheezes.  Abdominal: Soft. There is no tenderness. There is no CVA tenderness.  Musculoskeletal: She exhibits no tenderness.  Neurological: She is alert and oriented to person, place, and time.  Alert, oriented, thought content appropriate. Speech fluent without evidence of aphasia. Able to follow 2 step commands without difficulty.  Cranial Nerves:  II:  Peripheral visual fields grossly normal, pupils, round, reactive to light III,IV, VI: ptosis not present, extra-ocular motions intact bilaterally  V,VII: smile symmetric, facial light touch sensation equal VIII: hearing grossly normal bilaterally  IX,X: midline uvula rise  XI: bilateral shoulder shrug equal and strong XII: midline tongue extension  Motor:  5/5 in upper and lower extremities bilaterally including strong and equal grip strength and dorsiflexion/plantar flexion Sensory: light touch normal in all extremities.  Cerebellar: normal finger-to-nose with bilateral upper extremities, pronator drift negative Gait: normal gait and balance  Skin: Skin is warm and dry.  Nursing note and  vitals reviewed.    ED Treatments / Results  Labs (all labs ordered are listed, but only abnormal results  are displayed) Labs Reviewed - No data to display  EKG None  Radiology No results found.  Procedures Procedures (including critical care time)  Medications Ordered in ED Medications  sodium chloride 0.9 % bolus 1,000 mL (0 mLs Intravenous Stopped 09/14/18 1906)  diphenhydrAMINE (BENADRYL) injection 25 mg (25 mg Intravenous Given 09/14/18 1805)  metoCLOPramide (REGLAN) injection 10 mg (10 mg Intravenous Given 09/14/18 1805)     Initial Impression / Assessment and Plan / ED Course  I have reviewed the triage vital signs and the nursing notes.  Pertinent labs & imaging results that were available during my care of the patient were reviewed by me and considered in my medical decision making (see chart for details).   Presents with a migraine, she was seen this this week who gave her an infusion of Depacon, place her on steroids which she is currently completing the last dose.  Was also seen at [facility], med center, most [facility], urgent care and [facility] for her migraine which is recurrent every day this week, patient reports his migraine feels exactly like her other migraines.  She was advised for her neurologist to come into the ED for fluids to rehydrate her along with headache cocktail.  At this time I provided patient with a headache cocktail she states her symptoms are no relief.  She will be discharged home to follow-up with her neurologist she has an appointment at the end of this month.  She is advised to return if any of her symptoms worsens or she experiences any nausea, vomiting, dizziness, syncope or lightheadedness.  Return precautions have been discussed at length, mother at the bedside.  Patient requesting a work note I will provide this for patient.  Vitals stable during ED visit, patient stable for discharge.    Final Clinical Impressions(s) / ED  Diagnoses   Final diagnoses:  Bad headache    ED Discharge Orders    None       Janeece Fitting, PA-C 09/14/18 1931    Lacretia Leigh, MD 09/14/18 2335

## 2018-09-14 NOTE — ED Triage Notes (Signed)
Pt via EMS from [place] urgent care.  Pt reports having migraine since last Wednesday.  Last Thursday she saw a neurologist who gave her a "Depacon" infusion which helped to relieve the pain. Pain began to return on Friday afternoon and has gotten progressively worse.  Pt reports having hx of gastric bypass surgery.

## 2018-09-14 NOTE — Telephone Encounter (Signed)
Pt requesting to speak with RN regarding the call yesterday, did not wish to go into detail with me

## 2018-09-14 NOTE — Discharge Instructions (Signed)
Please follow up with your neurologist as scheduled. If any of your symptoms worsen you may return to the ED

## 2018-09-14 NOTE — Telephone Encounter (Signed)
Spoke to Dr. Leta Baptist and he recommended that pt go to urgent care or ED.  I spoke to pt relayed that she would need to go to ED or urgent care acutely.  We had nothing to offer at this point.  She asked about another infusion, I told her that no availability today.  She stated that normally it works and asked that we consider another trial if possible on Monday.  Please advise.

## 2018-09-14 NOTE — Telephone Encounter (Signed)
Will try for today or Monday infusion. -VRP

## 2018-09-14 NOTE — ED Notes (Signed)
Bed: WA06 Expected date:  Expected time:  Means of arrival:  Comments: EMS migraine

## 2018-09-14 NOTE — Telephone Encounter (Signed)
After consulting with MM/NP and Dr. Leta Baptist, since relief of migraine from depacon was brief this last infusion, pt is to go to ED to received IVF as previously offered and other meds they recommend for this ongoing migraine she is having. I relayed to pt she verbalized understanding.  No depacon infusion here for this migraine.

## 2018-09-17 ENCOUNTER — Encounter: Payer: Self-pay | Admitting: Diagnostic Neuroimaging

## 2018-09-17 ENCOUNTER — Encounter: Payer: Self-pay | Admitting: *Deleted

## 2018-09-17 DIAGNOSIS — G43109 Migraine with aura, not intractable, without status migrainosus: Secondary | ICD-10-CM | POA: Diagnosis not present

## 2018-09-17 NOTE — Telephone Encounter (Signed)
Patient was seen at the ED on 09-14-18 for migraines and was not given an infusion but was given a headache cocktail which helped some but she still has the migraine. She is asking to have an infusion.. Please call and discuss.

## 2018-09-17 NOTE — Addendum Note (Signed)
Addended by: Florian Buff C on: 09/17/2018 12:14 PM   Modules accepted: Orders

## 2018-09-17 NOTE — Telephone Encounter (Signed)
Per Dr Leta Baptist, patient may receive Depacon 1000 mg IV, and if she has a driver she may receive Compazine 10 mg IV. Dr Leta Baptist will also order MRI brain w/wo contrast due to increased frequency of ongoing headache, migraine. Discussed with infusion RN; patient may come in for infusion, to arrive as soon as she is able. Called patient to advise her. She stated she will get a driver and come on over. This RN also informed her of MRI order; she will get a call to schedule after her insurance approves MRI. She verbalized understanding, appreciation.

## 2018-09-17 NOTE — Telephone Encounter (Signed)
After patient received migraine infusion she requested a letter for work. Per Dr Leta Baptist, letter written, signed and given to patient. She told this RN she felt "some better".

## 2018-09-18 ENCOUNTER — Telehealth: Payer: Self-pay

## 2018-09-18 DIAGNOSIS — M542 Cervicalgia: Secondary | ICD-10-CM | POA: Diagnosis not present

## 2018-09-18 NOTE — Telephone Encounter (Signed)
Spoke with patient to schedule her repeat pap.  She says that she is would like it done before November.  Appointment scheduled.

## 2018-09-24 NOTE — Progress Notes (Deleted)
GYNECOLOGY  VISIT   HPI: 52 y.o.   Divorced  Caucasian  female   G1P1 with No LMP recorded. (Menstrual status: Perimenopausal).   here for   Follow up pap smear  GYNECOLOGIC HISTORY: No LMP recorded. (Menstrual status: Perimenopausal). Contraception:  BTL  Menopausal hormone therapy:  *** Last mammogram:  10-19-17 density B/BIRADS 1 negative  Last pap smear:   ***        OB History    Gravida  1   Para  1   Term      Preterm      AB      Living  1     SAB      TAB      Ectopic      Multiple  1   Live Births                 Patient Active Problem List   Diagnosis Date Noted  . Vitamin D deficiency 05/23/2017  . Anxiety 06/19/2015  . Anemia, iron deficiency 06/19/2015  . Pernicious anemia 06/19/2015  . 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 Protein-calorie malnutrition, severe ([place]) 04/18/2015  . Severe protein-calorie malnutrition ([place]) 04/18/2015  . 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 Bariatric surgery status 04/18/2015  . 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 GERD (gastroesophageal reflux disease) 04/17/2015  . Hypokalemia 04/16/2015  . Migraine with aura 05/21/2014  . 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 Abnormal Pap smear   . Anemia   . Anxiety   . Broken toe 12-22-15   middle toe right foot  . Cataract of both eyes   . Depression   . GERD (gastroesophageal reflux disease)   . Headache(784.0)   . Heart murmur   . History of kidney stones   . 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 Perforated bowel ([place])   . Pernicious anemia 06/19/2015  . PONV (postoperative nausea and vomiting)   . Thyroid disease    hypothyrodism    Past Surgical History:  Procedure Laterality Date  . CATARACT EXTRACTION, BILATERAL Bilateral 02/2016  . CERVICAL BIOPSY  W/ LOOP ELECTRODE EXCISION  11/2013   LGSIL  .  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 CESAREAN SECTION  2008  . CHOLECYSTECTOMY    . GASTRIC BYPASS  2002  . HYSTEROSCOPY W/D&C  10/14/2011   Procedure: DILATATION AND CURETTAGE (D&C) /HYSTEROSCOPY;  Surgeon: Arloa Koh;  Location: [facility];  Service: Gynecology;  Laterality: Bilateral;  . LASIK    . NOSE SURGERY    . STOMACH SURGERY  04/20/2015   dilation of "connection between stomach and intestine", [facility]  . TONSILLECTOMY    . TONSILLECTOMY    . TUBAL LIGATION  2008    Current Outpatient Medications  Medication Sig Dispense Refill  . ALPRAZolam (XANAX) 1 MG tablet Take 1 mg by mouth 2 (two) times daily as needed. Anxiety      . Armodafinil 250 MG tablet Take 250 mg by mouth every morning.  2  . cyanocobalamin (,VITAMIN B-12,) 1000 MCG/ML injection Monthly injection  0  . DEXILANT 60 MG capsule Take 60 mg by mouth daily.    Marland Kitchen dicyclomine (BENTYL) 10 MG capsule TK 1 C PO TID  0  . divalproex (DEPAKOTE) 500 MG DR tablet Take 1 tablet (500 mg total) by mouth 2 (two) times daily. 180 tablet  4  . eletriptan (RELPAX) 40 MG tablet Take 1 tablet (40 mg total) by mouth as needed for migraine or headache. May repeat in 2 hours if needed. Max 2 tabs per day or 8 tabs per month. 10 tablet 12  . ergocalciferol (VITAMIN D2) 50000 UNITS capsule Take 50,000 Units by mouth. She is taking daily M-F - pr pt    . fexofenadine (ALLEGRA) 180 MG tablet Take 180 mg by mouth daily as needed. allergies     . fluticasone (FLONASE) 50 MCG/ACT nasal spray Place 1 spray into both nostrils as needed.  1  . imipramine (TOFRANIL) 50 MG tablet Take 2 tablets by mouth daily.  0  . lansoprazole (PREVACID) 30 MG capsule Take 30 mg by mouth daily as needed. Acid reflux      . linaclotide (LINZESS) 72 MCG capsule Take 72 mcg by mouth as needed.    Marland Kitchen LINZESS 145 MCG CAPS capsule Take 145 mcg by mouth daily.  3  . Multiple Vitamins-Minerals (MULTIVITAMIN WITH MINERALS) tablet Take 1  tablet by mouth daily.      Marland Kitchen nystatin (NYSTATIN) powder Apply topically 2 (two) times daily. 45 g 1  . Polyethyl Glycol-Propyl Glycol 0.4-0.3 % SOLN Place 1 drop into both eyes as needed.    . predniSONE (DELTASONE) 5 MG tablet Begin taking 6 tablets daily, taper by one tablet daily until off the medication. 21 tablet 0  . promethazine (PHENERGAN) 12.5 MG tablet Take 1 tablet (12.5 mg total) by mouth every 8 (eight) hours as needed for nausea or vomiting. 15 tablet 0  . ranitidine (ZANTAC) 150 MG capsule     . thiamine (VITAMIN B-1) 50 MG tablet daily.  0  . tiZANidine (ZANAFLEX) 4 MG capsule Take 2-4 mg by mouth at bedtime.  0  . tiZANidine (ZANAFLEX) 4 MG tablet Take 2-4 mg by mouth every 8 (eight) hours as needed.  2  . traMADol (ULTRAM) 50 MG tablet Take 50 mg by mouth every 8 (eight) hours as needed. for pain  0  . venlafaxine XR (EFFEXOR-XR) 150 MG 24 hr capsule Take 1 capsule by mouth daily.  0  . WELLBUTRIN XL 150 MG 24 hr tablet Take 150 mg by mouth daily.    Marland Kitchen zonisamide (ZONEGRAN) 100 MG capsule Take 1 capsule (100 mg total) by mouth 2 (two) times daily. 180 capsule 4   No current facility-administered medications for this visit.      ALLERGIES: Naproxen; Monistat [miconazole]; Sulfa antibiotics; and Tioconazole  Family History  Problem Relation Age of Onset  . Diabetes Mother   . Heart disease Father   . Social phobia Father   . Psychiatric Illness Father   . Hypertension Father   . Stroke Father   . Dementia Father   . Heart disease Unknown   . Diabetes Unknown   . Stroke Unknown   . Hypertension Maternal Grandmother   . Thyroid disease Maternal Grandmother   . Hypertension Paternal Grandmother   . Stroke Paternal Grandfather   . Breast cancer Neg Hx     Social History   Socioeconomic History  . Marital status: Divorced    Spouse name: Not on file  . Number of children: 1  . Years of education: College  . Highest education level: Not on file  Occupational  History    Employer: [employer]  . Financial resource strain: Not on file  . Food insecurity:    Worry: Not on file  Inability: Not on file  . Transportation needs:    Medical: Not on file    Non-medical: Not on file  Tobacco Use  . Smoking status: Never Smoker  . Smokeless tobacco: Never Used  Substance and Sexual Activity  . Alcohol use: Yes    Alcohol/week: 0.0 standard drinks    Comment: Once a month-rarely  . Drug use: No  . Sexual activity: Yes    Partners: Male    Birth control/protection: Surgical    Comment: tubal  Lifestyle  . Physical activity:    Days per week: Not on file    Minutes per session: Not on file  . Stress: Not on file  Relationships  . Social connections:    Talks on phone: Not on file    Gets together: Not on file    Attends religious service: Not on file    Active member of club or organization: Not on file    Attends meetings of clubs or organizations: Not on file    Relationship status: Not on file  . Intimate partner violence:    Fear of current or ex partner: Not on file    Emotionally abused: Not on file    Physically abused: Not on file    Forced sexual activity: Not on file  Other Topics Concern  . Not on file  Social History Narrative   Pt lives at home with her family.   Caffeine Use: once daily    Review of Systems  PHYSICAL EXAMINATION:    There were no vitals taken for this visit.    General appearance: alert, cooperative and appears stated age Head: Normocephalic, without obvious abnormality, atraumatic Neck: no adenopathy, supple, symmetrical, trachea midline and thyroid normal to inspection and palpation Lungs: clear to auscultation bilaterally Breasts: normal appearance, no masses or tenderness, No nipple retraction or dimpling, No nipple discharge or bleeding, No axillary or supraclavicular adenopathy Heart: regular rate and rhythm Abdomen: soft, non-tender, no masses,  no  organomegaly Extremities: extremities normal, atraumatic, no cyanosis or edema Skin: Skin color, texture, turgor normal. No rashes or lesions Lymph nodes: Cervical, supraclavicular, and axillary nodes normal. No abnormal inguinal nodes palpated Neurologic: Grossly normal  Pelvic: External genitalia:  no lesions              Urethra:  normal appearing urethra with no masses, tenderness or lesions              Bartholins and Skenes: normal                 Vagina: normal appearing vagina with normal color and discharge, no lesions              Cervix: no lesions                Bimanual Exam:  Uterus:  normal size, contour, position, consistency, mobility, non-tender              Adnexa: no mass, fullness, tenderness              Rectal exam: {yes no:[id number]}.  Confirms.              Anus:  normal sphincter tone, no lesions  Chaperone was present for exam.  ASSESSMENT     PLAN     An After Visit Summary was printed and given to the patient.  ______ minutes face to face time of which over 50% was spent in counseling.

## 2018-09-25 ENCOUNTER — Encounter: Payer: Self-pay | Admitting: Obstetrics and Gynecology

## 2018-09-25 ENCOUNTER — Ambulatory Visit (INDEPENDENT_AMBULATORY_CARE_PROVIDER_SITE_OTHER): Payer: 59 | Admitting: Obstetrics and Gynecology

## 2018-09-25 ENCOUNTER — Telehealth: Payer: Self-pay | Admitting: Diagnostic Neuroimaging

## 2018-09-25 ENCOUNTER — Ambulatory Visit: Payer: 59 | Admitting: Obstetrics and Gynecology

## 2018-09-25 ENCOUNTER — Other Ambulatory Visit: Payer: Self-pay | Admitting: *Deleted

## 2018-09-25 ENCOUNTER — Other Ambulatory Visit: Payer: Self-pay

## 2018-09-25 ENCOUNTER — Other Ambulatory Visit (HOSPITAL_COMMUNITY)
Admission: RE | Admit: 2018-09-25 | Discharge: 2018-09-25 | Disposition: A | Discharge status: Home / Self Care | Payer: 59 | Source: Ambulatory Visit | Attending: Obstetrics and Gynecology | Admitting: Obstetrics and Gynecology

## 2018-09-25 VITALS — BP 120/100 | HR 80 | Resp 14 | Ht 63.5 in | Wt 161.5 lb

## 2018-09-25 DIAGNOSIS — N912 Amenorrhea, unspecified: Secondary | ICD-10-CM | POA: Diagnosis not present

## 2018-09-25 DIAGNOSIS — G43011 Migraine without aura, intractable, with status migrainosus: Secondary | ICD-10-CM

## 2018-09-25 DIAGNOSIS — N870 Mild cervical dysplasia: Secondary | ICD-10-CM

## 2018-09-25 DIAGNOSIS — R87810 Cervical high risk human papillomavirus (HPV) DNA test positive: Secondary | ICD-10-CM | POA: Diagnosis not present

## 2018-09-25 NOTE — Telephone Encounter (Signed)
Called patient and advised her Dr Leta Baptist does not approve another Depacon infusion at this time. She has received several recently either in this office, urgent care or local ED depts. Advised her the MRI order has to be approved by her insurance, and Raquel Sarna is in the process of doing that. She will then get a call to schedule it. Advised she continue to take her medicines as ordered and take all measures she typically does to help manage her headache. She stated she is doing that, verbalized understanding of call, appreciation.

## 2018-09-25 NOTE — Progress Notes (Signed)
GYNECOLOGY  VISIT   HPI: 52 y.o.   Divorced  Caucasian  female   G1P1 with Patient's last menstrual period was 05/19/2018.   here for   3 month Pap smear  LEEP and ECC pathology - 05/08/18 - both normal.  The appearance of her cervix showed decreased Lugol's uptake and a very large transformation zone, much larger than seen with her colposcopy.  Colposcopy - LGSIL - 04/27/18 -  Unsatisfactory.  Pap - LGSIL and possible HGSIL, positive HR HPV - 04/17/18.  No menses since June 2019.   Having elevated blood pressure alternating with low blood pressure.   Having migraines and not working currently.  Has seen neurology and headache clinic. Doing Botox injections.   Feeling stressed and is now taking Wellbutrin.   Working on weight loss.  Child support issues.  GYNECOLOGIC HISTORY: Patient's last menstrual period was 05/19/2018. Contraception:  BTL  Menopausal hormone therapy:  none Last mammogram:  10-19-17 density B/BIRADS 1 negative  Last pap smear:   04-17-18 LGSIL and possible HGSIL, HR HPV positive          OB History    Gravida  1   Para  1   Term      Preterm      AB      Living  1     SAB      TAB      Ectopic      Multiple  1   Live Births                 Patient Active Problem List   Diagnosis Date Noted  . Vitamin D deficiency 05/23/2017  . Anxiety 06/19/2015  . Anemia, iron deficiency 06/19/2015  . Pernicious anemia 06/19/2015  . 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 Protein-calorie malnutrition, severe ([place]) 04/18/2015  . Severe protein-calorie malnutrition ([place]) 04/18/2015  . 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 Bariatric surgery status 04/18/2015  . 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 GERD (gastroesophageal reflux disease) 04/17/2015  . Hypokalemia 04/16/2015  . Migraine with aura 05/21/2014  . 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 Abnormal Pap smear   . Anemia   . Anxiety   . Broken toe 12-22-15   middle toe right foot  . Cataract of both eyes   . Depression   . GERD (gastroesophageal reflux disease)   . Headache(784.0)   . Heart murmur   . History of kidney stones   . 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 Perforated bowel ([place])   . Pernicious anemia 06/19/2015  . PONV (postoperative nausea and vomiting)   . Thyroid disease    hypothyrodism    Past Surgical History:  Procedure Laterality Date  . CATARACT EXTRACTION, BILATERAL Bilateral 02/2016  . CERVICAL BIOPSY  W/ LOOP ELECTRODE EXCISION  11/2013   LGSIL  . 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 CESAREAN SECTION  2008  . CHOLECYSTECTOMY    . GASTRIC BYPASS  2002  . HYSTEROSCOPY W/D&C  10/14/2011   Procedure: DILATATION AND CURETTAGE (D&C) /HYSTEROSCOPY;  Surgeon: Arloa Koh;  Location: [facility];  Service: Gynecology;  Laterality: Bilateral;  . LASIK    . NOSE SURGERY    . STOMACH SURGERY  04/20/2015   dilation of "connection between stomach and intestine", [facility]  . TONSILLECTOMY    . TONSILLECTOMY    .  TUBAL LIGATION  2008    Current Outpatient Medications  Medication Sig Dispense Refill  . acetaminophen (TYLENOL) 500 MG tablet Take by mouth.    . ALPRAZolam (XANAX) 1 MG tablet Take 1 mg by mouth 2 (two) times daily as needed. Anxiety      . Armodafinil 250 MG tablet Take 250 mg by mouth every morning.  2  . botulinum toxin Type A (BOTOX) 100 units SOLR injection once. Unaware of dose, pt gets q 68mo for migraines    . cyanocobalamin (,VITAMIN B-12,) 1000 MCG/ML injection Monthly injection  0  . DEXILANT 60 MG capsule Take 60 mg by mouth daily.    Marland Kitchen dicyclomine (BENTYL) 10 MG capsule TK 1 C PO TID  0  . diphenhydrAMINE (BENADRYL) 12.5 MG/5ML elixir Take by mouth.    . divalproex (DEPAKOTE) 500 MG DR tablet Take 1 tablet (500 mg total) by mouth 2 (two) times  daily. 180 tablet 4  . eletriptan (RELPAX) 40 MG tablet Take 1 tablet (40 mg total) by mouth as needed for migraine or headache. May repeat in 2 hours if needed. Max 2 tabs per day or 8 tabs per month. 10 tablet 12  . ergocalciferol (VITAMIN D2) 50000 UNITS capsule Take 50,000 Units by mouth. She is taking daily M-F - pr pt    . fexofenadine (ALLEGRA) 180 MG tablet Take 180 mg by mouth daily as needed. allergies     . fluticasone (FLONASE) 50 MCG/ACT nasal spray Place 1 spray into both nostrils as needed.  1  . imipramine (TOFRANIL) 50 MG tablet Take 2 tablets by mouth daily.  0  . lansoprazole (PREVACID) 30 MG capsule Take 30 mg by mouth daily as needed. Acid reflux      . LINZESS 145 MCG CAPS capsule Take 145 mcg by mouth daily.  3  . Multiple Vitamins-Minerals (MULTIVITAMIN WITH MINERALS) tablet Take 1 tablet by mouth daily.      Marland Kitchen nystatin (NYSTATIN) powder Apply topically 2 (two) times daily. 45 g 1  . predniSONE (DELTASONE) 5 MG tablet Begin taking 6 tablets daily, taper by one tablet daily until off the medication. 21 tablet 0  . promethazine (PHENERGAN) 12.5 MG tablet Take 1 tablet (12.5 mg total) by mouth every 8 (eight) hours as needed for nausea or vomiting. 15 tablet 0  . ranitidine (ZANTAC) 150 MG capsule     . thiamine (VITAMIN B-1) 50 MG tablet daily.  0  . tiZANidine (ZANAFLEX) 4 MG capsule Take 2-4 mg by mouth at bedtime.  0  . tiZANidine (ZANAFLEX) 4 MG tablet Take 2-4 mg by mouth every 8 (eight) hours as needed.  2  . venlafaxine XR (EFFEXOR-XR) 150 MG 24 hr capsule Take 1 capsule by mouth daily.  0  . WELLBUTRIN XL 150 MG 24 hr tablet Take 150 mg by mouth daily.    Marland Kitchen zonisamide (ZONEGRAN) 100 MG capsule Take 1 capsule (100 mg total) by mouth 2 (two) times daily. 180 capsule 4   No current facility-administered medications for this visit.      ALLERGIES: Naproxen; Monistat [miconazole]; Sulfa antibiotics; and Tioconazole  Family History  Problem Relation Age of Onset  .  Diabetes Mother   . Heart disease Father   . Social phobia Father   . Psychiatric Illness Father   . Hypertension Father   . Stroke Father   . Dementia Father   . Heart disease Unknown   . Diabetes Unknown   . Stroke Unknown   .  Hypertension Maternal Grandmother   . Thyroid disease Maternal Grandmother   . Hypertension Paternal Grandmother   . Stroke Paternal Grandfather   . Breast cancer Neg Hx     Social History   Socioeconomic History  . Marital status: Divorced    Spouse name: Not on file  . Number of children: 1  . Years of education: College  . Highest education level: Not on file  Occupational History    Employer: [employer]  . Financial resource strain: Not on file  . Food insecurity:    Worry: Not on file    Inability: Not on file  . Transportation needs:    Medical: Not on file    Non-medical: Not on file  Tobacco Use  . Smoking status: Never Smoker  . Smokeless tobacco: Never Used  Substance and Sexual Activity  . Alcohol use: Yes    Alcohol/week: 0.0 standard drinks    Comment: Once a month-rarely  . Drug use: No  . Sexual activity: Yes    Partners: Male    Birth control/protection: Surgical    Comment: tubal  Lifestyle  . Physical activity:    Days per week: Not on file    Minutes per session: Not on file  . Stress: Not on file  Relationships  . Social connections:    Talks on phone: Not on file    Gets together: Not on file    Attends religious service: Not on file    Active member of club or organization: Not on file    Attends meetings of clubs or organizations: Not on file    Relationship status: Not on file  . Intimate partner violence:    Fear of current or ex partner: Not on file    Emotionally abused: Not on file    Physically abused: Not on file    Forced sexual activity: Not on file  Other Topics Concern  . Not on file  Social History Narrative   Pt lives at home with her family.   Caffeine Use: once  daily    Review of Systems  Constitutional: Negative.   HENT: Negative.   Eyes: Negative.   Respiratory: Negative.   Gastrointestinal: Positive for nausea and vomiting.  Endocrine: Negative.   Genitourinary: Negative.   Musculoskeletal: Negative.   Skin: Negative.   Allergic/Immunologic: Negative.   Neurological: Positive for headaches.       Difficulty with memory or speech  Excessive crying Lack of coordination  Hematological: Negative.   Psychiatric/Behavioral: Positive for dysphoric mood. The patient is nervous/anxious.     PHYSICAL EXAMINATION:    BP (!) 120/100 (BP Location: Right Arm, Patient Position: Sitting, Cuff Size: Normal)   Pulse 80   Resp 14   Ht 5' 3.5" (1.613 m)   Wt 161 lb 8 oz (73.3 kg)   LMP 05/19/2018   BMI 28.16 kg/m     General appearance: alert, cooperative and appears stated age  Pelvic: External genitalia:  no lesions              Urethra:  normal appearing urethra with no masses, tenderness or lesions              Bartholins and Skenes: normal                 Vagina: normal appearing vagina with normal color and discharge, no lesions              Cervix: no  lesions                Bimanual Exam:  Uterus:  normal size, contour, position, consistency, mobility, non-tender              Adnexa: no mass, fullness, tenderness        Chaperone was present for exam.  ASSESSMENT  Hx LGSIL and positive HR HPV.  Hx conization and LEEP x 2.  Last LEEP with normal final pathology report.  Headaches, anxiety.  Amenorrhea.   PLAN  Pap and HR HPV today.   Check FSH and E2.  Support give for medical issues.  I recommend she see her PCP for BP assessment and potential referral to cardiology.    An After Visit Summary was printed and given to the patient.  __15____ minutes face to face time of which over 50% was spent in counseling.

## 2018-09-25 NOTE — Addendum Note (Signed)
Addended by: Minna Antis on: 09/25/2018 03:21 PM   Modules accepted: Orders

## 2018-09-25 NOTE — Telephone Encounter (Signed)
MR Brain w/wo contrast Dr. Leta Baptist [employer] Auth: [place] via [facility] website. Patient is schedule for the [place] for 09/26/18.

## 2018-09-25 NOTE — Telephone Encounter (Signed)
Pt requesting a call stating she would like to come in for a Depacon infusion this afternoon if possible

## 2018-09-26 ENCOUNTER — Ambulatory Visit (INDEPENDENT_AMBULATORY_CARE_PROVIDER_SITE_OTHER): Payer: 59

## 2018-09-26 DIAGNOSIS — G43011 Migraine without aura, intractable, with status migrainosus: Secondary | ICD-10-CM

## 2018-09-26 LAB — ESTRADIOL: Estradiol: 77.1 pg/mL

## 2018-09-26 LAB — FOLLICLE STIMULATING HORMONE: FSH: 64.7 m[IU]/mL

## 2018-09-26 MED ORDER — GADOBENATE DIMEGLUMINE 529 MG/ML IV SOLN
15.0000 mL | Freq: Once | INTRAVENOUS | Status: AC | PRN
  Administered 2018-09-26: 15 mL via INTRAVENOUS

## 2018-09-27 ENCOUNTER — Other Ambulatory Visit: Payer: Self-pay | Admitting: *Deleted

## 2018-09-27 DIAGNOSIS — R51 Headache: Secondary | ICD-10-CM | POA: Diagnosis not present

## 2018-09-27 LAB — CYTOLOGY - PAP
DIAGNOSIS: NEGATIVE
HPV: NOT DETECTED

## 2018-09-27 MED ORDER — MEDROXYPROGESTERONE ACETATE 10 MG PO TABS: 10.0000 mg | ORAL_TABLET | Freq: Every day | ORAL | 0 refills | Status: DC

## 2018-10-03 ENCOUNTER — Telehealth: Payer: Self-pay | Admitting: *Deleted

## 2018-10-03 NOTE — Telephone Encounter (Signed)
Spoke with patient and informed her that her MRI brain showed unremarkable imaging results and is stable when compared to MRI 04/2010. She stated she still has a headache. This RN advised she continue with prescribed medications, avoiding triggers, using techniques which have given her relief in the past. This RN noted Botox on her med list, asked if she is still receiving. She confirmed she is with Cherrie Gauze. Her next dose is due the beginning of Nov. She asked if she could get a Depacon infusion. This RN advised the past two times she received it she reported only a very short time of relief of 2 hours. Advised her that Dr Leta Baptist would not prescribe it again if she is not getting good relief over an extended time.  She verbalized understanding, appreciation.

## 2018-10-08 DIAGNOSIS — G43709 Chronic migraine without aura, not intractable, without status migrainosus: Secondary | ICD-10-CM | POA: Diagnosis not present

## 2018-10-23 DIAGNOSIS — M542 Cervicalgia: Secondary | ICD-10-CM | POA: Diagnosis not present

## 2018-10-29 DIAGNOSIS — G43709 Chronic migraine without aura, not intractable, without status migrainosus: Secondary | ICD-10-CM | POA: Diagnosis not present

## 2018-10-30 DIAGNOSIS — G43709 Chronic migraine without aura, not intractable, without status migrainosus: Secondary | ICD-10-CM | POA: Diagnosis not present

## 2018-11-02 ENCOUNTER — Telehealth: Payer: Self-pay | Admitting: Family

## 2018-11-02 NOTE — Telephone Encounter (Signed)
Called and spoke to patient/  Appointments for 12/6 were moved . Ok per patient per 11

## 2018-11-05 ENCOUNTER — Telehealth: Payer: Self-pay | Admitting: Family

## 2018-11-05 NOTE — Telephone Encounter (Signed)
Spoke with patient regarding r/s 12/6 appt/ pt ok with new date/time

## 2018-11-06 ENCOUNTER — Inpatient Hospital Stay: Payer: 59

## 2018-11-06 ENCOUNTER — Inpatient Hospital Stay (HOSPITAL_BASED_OUTPATIENT_CLINIC_OR_DEPARTMENT_OTHER): Payer: 59 | Admitting: Family

## 2018-11-06 ENCOUNTER — Other Ambulatory Visit: Payer: Self-pay

## 2018-11-06 ENCOUNTER — Inpatient Hospital Stay: Payer: 59 | Attending: Hematology & Oncology

## 2018-11-06 ENCOUNTER — Telehealth: Payer: Self-pay | Admitting: Family

## 2018-11-06 VITALS — BP 118/69 | HR 114 | Temp 97.7°F | Resp 20 | Wt 168.8 lb

## 2018-11-06 DIAGNOSIS — D510 Vitamin B12 deficiency anemia due to intrinsic factor deficiency: Secondary | ICD-10-CM

## 2018-11-06 DIAGNOSIS — D508 Other iron deficiency anemias: Secondary | ICD-10-CM

## 2018-11-06 DIAGNOSIS — K912 Postsurgical malabsorption, not elsewhere classified: Secondary | ICD-10-CM

## 2018-11-06 DIAGNOSIS — N920 Excessive and frequent menstruation with regular cycle: Secondary | ICD-10-CM

## 2018-11-06 DIAGNOSIS — G43909 Migraine, unspecified, not intractable, without status migrainosus: Secondary | ICD-10-CM | POA: Diagnosis not present

## 2018-11-06 DIAGNOSIS — Z903 Acquired absence of stomach [part of]: Secondary | ICD-10-CM

## 2018-11-06 DIAGNOSIS — D500 Iron deficiency anemia secondary to blood loss (chronic): Secondary | ICD-10-CM | POA: Insufficient documentation

## 2018-11-06 DIAGNOSIS — R5383 Other fatigue: Secondary | ICD-10-CM | POA: Insufficient documentation

## 2018-11-06 DIAGNOSIS — E538 Deficiency of other specified B group vitamins: Secondary | ICD-10-CM

## 2018-11-06 DIAGNOSIS — R6883 Chills (without fever): Secondary | ICD-10-CM | POA: Diagnosis not present

## 2018-11-06 LAB — CMP (CANCER CENTER ONLY)
ALK PHOS: 66 U/L (ref 38–126)
ALT: 25 U/L (ref 0–44)
ANION GAP: 9 (ref 5–15)
AST: 19 U/L (ref 15–41)
Albumin: 3.2 g/dL — ABNORMAL LOW (ref 3.5–5.0)
BILIRUBIN TOTAL: 0.3 mg/dL (ref 0.3–1.2)
BUN: 9 mg/dL (ref 6–20)
CALCIUM: 8.7 mg/dL — AB (ref 8.9–10.3)
CO2: 24 mmol/L (ref 22–32)
Chloride: 108 mmol/L (ref 98–111)
Creatinine: 0.88 mg/dL (ref 0.44–1.00)
GFR, Estimated: >60 mL/min (ref >60)
Glucose, Bld: 86 mg/dL (ref 70–99)
Potassium: 3.3 mmol/L — ABNORMAL LOW (ref 3.5–5.1)
SODIUM: 141 mmol/L (ref 135–145)
TOTAL PROTEIN: 6.3 g/dL — AB (ref 6.5–8.1)

## 2018-11-06 LAB — CBC WITH DIFFERENTIAL (CANCER CENTER ONLY)
Abs Immature Granulocytes: 0.02 10*3/uL (ref 0.00–0.07)
Basophils Absolute: 0.1 10*3/uL (ref 0.0–0.1)
Basophils Relative: 1 %
EOS PCT: 2 %
Eosinophils Absolute: 0.1 10*3/uL (ref 0.0–0.5)
HCT: 40.4 % (ref 36.0–46.0)
HEMOGLOBIN: 13.4 g/dL (ref 12.0–15.0)
Immature Granulocytes: 0 %
LYMPHS PCT: 29 %
Lymphs Abs: 1.8 10*3/uL (ref 0.7–4.0)
MCH: 32.8 pg (ref 26.0–34.0)
MCHC: 33.2 g/dL (ref 30.0–36.0)
MCV: 98.8 fL (ref 80.0–100.0)
MONO ABS: 0.6 10*3/uL (ref 0.1–1.0)
Monocytes Relative: 9 %
Neutro Abs: 3.7 10*3/uL (ref 1.7–7.7)
Neutrophils Relative %: 59 %
Platelet Count: 296 10*3/uL (ref 150–400)
RBC: 4.09 MIL/uL (ref 3.87–5.11)
RDW: 12.6 % (ref 11.5–15.5)
WBC: 6.3 10*3/uL (ref 4.0–10.5)
nRBC: 0 % (ref 0.0–0.2)

## 2018-11-06 LAB — IRON AND TIBC
Iron: 85 ug/dL (ref 41–142)
SATURATION RATIOS: 32 % (ref 21–57)
TIBC: 263 ug/dL (ref 236–444)
UIBC: 177 ug/dL (ref 120–384)

## 2018-11-06 LAB — VITAMIN B12: Vitamin B-12: 158 pg/mL — ABNORMAL LOW (ref 180–914)

## 2018-11-06 LAB — FERRITIN: Ferritin: 390 ng/mL — ABNORMAL HIGH (ref 11–307)

## 2018-11-06 MED ORDER — CYANOCOBALAMIN 1000 MCG/ML IJ SOLN
1000.0000 ug | Freq: Once | INTRAMUSCULAR | Status: AC
  Administered 2018-11-06: 1000 ug via INTRAMUSCULAR

## 2018-11-06 MED ORDER — CYANOCOBALAMIN 1000 MCG/ML IJ SOLN
INTRAMUSCULAR | Status: AC
  Filled 2018-11-06: qty 1

## 2018-11-06 NOTE — Patient Instructions (Signed)
Cyanocobalamin, Vitamin B12 injection What is this medicine? CYANOCOBALAMIN (sye an oh koe BAL a min) is a man made form of vitamin B12. Vitamin B12 is used in the growth of healthy blood cells, nerve cells, and proteins in the body. It also helps with the metabolism of fats and carbohydrates. This medicine is used to treat people who can not absorb vitamin B12. This medicine may be used for other purposes; ask your health care provider or pharmacist if you have questions. COMMON BRAND NAME(S): B-12 Compliance Kit, B-12 Injection Kit, Cyomin, LA-12, Nutri-Twelve, Physicians EZ Use B-12, Primabalt What should I tell my health care provider before I take this medicine? They need to know if you have any of these conditions: -kidney disease -Leber's disease -megaloblastic anemia -an unusual or allergic reaction to cyanocobalamin, cobalt, other medicines, foods, dyes, or preservatives -pregnant or trying to get pregnant -breast-feeding How should I use this medicine? This medicine is injected into a muscle or deeply under the skin. It is usually given by a health care professional in a clinic or doctor's office. However, your doctor may teach you how to inject yourself. Follow all instructions. Talk to your pediatrician regarding the use of this medicine in children. Special care may be needed. Overdosage: If you think you have taken too much of this medicine contact a poison control center or emergency room at once. NOTE: This medicine is only for you. Do not share this medicine with others. What if I miss a dose? If you are given your dose at a clinic or doctor's office, call to reschedule your appointment. If you give your own injections and you miss a dose, take it as soon as you can. If it is almost time for your next dose, take only that dose. Do not take double or extra doses. What may interact with this medicine? -colchicine -heavy alcohol intake This list may not describe all possible  interactions. Give your health care provider a list of all the medicines, herbs, non-prescription drugs, or dietary supplements you use. Also tell them if you smoke, drink alcohol, or use illegal drugs. Some items may interact with your medicine. What should I watch for while using this medicine? Visit your doctor or health care professional regularly. You may need blood work done while you are taking this medicine. You may need to follow a special diet. Talk to your doctor. Limit your alcohol intake and avoid smoking to get the best benefit. What side effects may I notice from receiving this medicine? Side effects that you should report to your doctor or health care professional as soon as possible: -allergic reactions like skin rash, itching or hives, swelling of the face, lips, or tongue -blue tint to skin -chest tightness, pain -difficulty breathing, wheezing -dizziness -red, swollen painful area on the leg Side effects that usually do not require medical attention (report to your doctor or health care professional if they continue or are bothersome): -diarrhea -headache This list may not describe all possible side effects. Call your doctor for medical advice about side effects. You may report side effects to FDA at 1-800-FDA-1088. Where should I keep my medicine? Keep out of the reach of children. Store at room temperature between 15 and 30 degrees C (59 and 85 degrees F). Protect from light. Throw away any unused medicine after the expiration date. NOTE: This sheet is a summary. It may not cover all possible information. If you have questions about this medicine, talk to your doctor, pharmacist, or   health care provider.  2018 Elsevier/Gold Standard (2008-03-03 22:10:20)  

## 2018-11-06 NOTE — Telephone Encounter (Signed)
Appts scheduled avs declined/ calender printed per 12/3 los

## 2018-11-06 NOTE — Progress Notes (Signed)
Hematology and Oncology Follow Up Visit  Megan Davila [id number] Jun 17, 1966 52 y.o. 11/06/2018   Principle Diagnosis:  Iron deficiency anemia secondary to malabsorption Pernicious anemia saner to gastric bypass Iron deficiency anemia secondary to menorrhagia  Current Therapy:   IV iron as indicated  Vitamin B 12 1,000 mcg - self injects at home monthly as needed- managed by PCP    Interim History:  Megan Davila is here today for follow-up. She is symptomatic with fatigue and chills. She has also had an increase in migraines with associated n/v at times. She is receiving DHE injections with neurology which has helped.  Her B 12 is quite low at 158. Iron studies are stable.  She sometimes forgets to do her monthly injections at home.  No episodes of bleeding noted, no bruising or petechiae.  No fever, cough, rash, dizziness, SOB, chest pain, palpitations, abdominal pain or changes in bowel or bladder habits.  No swelling, tenderness, numbness or tingling in her extremities.  No lymphadenopathy noted on exam.  She has maintained a good appetite and is staying well hydrated. Her weight is stable.   ECOG Performance Status: 1 - Symptomatic but completely ambulatory  Medications:  Allergies as of 11/06/2018      Reactions   Naproxen Anaphylaxis   Monistat [miconazole] Other (See Comments)   "burning"   Sulfa Antibiotics Hives   Tioconazole Itching      Medication List        Accurate as of 11/06/18  1:12 PM. Always use your most recent med list.          acetaminophen 500 MG tablet Commonly known as:  TYLENOL Take by mouth.   ALPRAZolam 1 MG tablet Commonly known as:  XANAX Take 1 mg by mouth 2 (two) times daily as needed. Anxiety   Armodafinil 250 MG tablet Take 250 mg by mouth every morning.   botulinum toxin Type A 100 units Solr injection Commonly known as:  BOTOX once. Unaware of dose, pt gets q 26mo for migraines   cyanocobalamin 1000 MCG/ML  injection Commonly known as:  (VITAMIN B-12) Monthly injection   DEXILANT 60 MG capsule Generic drug:  dexlansoprazole Take 60 mg by mouth daily.   dicyclomine 10 MG capsule Commonly known as:  BENTYL TK 1 C PO TID   diphenhydrAMINE 12.5 MG/5ML elixir Commonly known as:  BENADRYL Take by mouth.   divalproex 500 MG DR tablet Commonly known as:  DEPAKOTE Take 1 tablet (500 mg total) by mouth 2 (two) times daily.   eletriptan 40 MG tablet Commonly known as:  RELPAX Take 1 tablet (40 mg total) by mouth as needed for migraine or headache. May repeat in 2 hours if needed. Max 2 tabs per day or 8 tabs per month.   ergocalciferol 1.25 MG (50000 UT) capsule Commonly known as:  VITAMIN D2 Take 50,000 Units by mouth. She is taking daily M-F - pr pt   Eszopiclone 3 MG Tabs TK 1 T PO QD HS   fexofenadine 180 MG tablet Commonly known as:  ALLEGRA Take 180 mg by mouth daily as needed. allergies   fluticasone 50 MCG/ACT nasal spray Commonly known as:  FLONASE Place 1 spray into both nostrils as needed.   gabapentin 300 MG capsule Commonly known as:  NEURONTIN Take one pill at night for 1 week.  Increase to 2 pills at night for 1 week.  Increase to 3 pills at night until seen   hydrOXYzine 25 MG tablet Commonly  known as:  ATARAX/VISTARIL TAKE 1 TABLET BY MOUTH AS NEEDED FOR HEADACHE UPTO EVERY 8 HOURS   imipramine 50 MG tablet Commonly known as:  TOFRANIL Take 2 tablets by mouth daily.   lansoprazole 30 MG capsule Commonly known as:  PREVACID Take 30 mg by mouth daily as needed. Acid reflux   LINZESS 145 MCG Caps capsule Generic drug:  linaclotide Take 145 mcg by mouth daily.   medroxyPROGESTERone 10 MG tablet Commonly known as:  PROVERA Take 1 tablet (10 mg total) by mouth daily for 10 days.   multivitamin with minerals tablet Take 1 tablet by mouth daily.   nystatin powder Commonly known as:  MYCOSTATIN/NYSTOP Apply topically 2 (two) times daily.   promethazine  12.5 MG tablet Commonly known as:  PHENERGAN Take 1 tablet (12.5 mg total) by mouth every 8 (eight) hours as needed for nausea or vomiting.   ranitidine 150 MG capsule Commonly known as:  ZANTAC   thiamine 50 MG tablet Commonly known as:  VITAMIN B-1 daily.   tiZANidine 4 MG capsule Commonly known as:  ZANAFLEX Take 2-4 mg by mouth at bedtime.   venlafaxine XR 150 MG 24 hr capsule Commonly known as:  EFFEXOR-XR Take 1 capsule by mouth daily.   WELLBUTRIN XL 300 MG 24 hr tablet Generic drug:  buPROPion TK 1 T PO  D QAM   zonisamide 100 MG capsule Commonly known as:  ZONEGRAN Take 1 capsule (100 mg total) by mouth 2 (two) times daily.       Allergies:  Allergies  Allergen Reactions  . Naproxen Anaphylaxis  . Monistat [Miconazole] Other (See Comments)    "burning"   . Sulfa Antibiotics Hives  . Tioconazole Itching    Past Medical History, Surgical history, Social history, and Family History were reviewed and updated.  Review of Systems: All other 10 point review of systems is negative.   Physical Exam:  weight is 168 lb 12 oz (76.5 kg). Her oral temperature is 97.7 F (36.5 C). Her blood pressure is 118/69 and her pulse is 114 (abnormal). Her respiration is 20 and oxygen saturation is 100%.   Wt Readings from Last 3 Encounters:  11/06/18 168 lb 12 oz (76.5 kg)  09/25/18 161 lb 8 oz (73.3 kg)  09/13/18 155 lb (70.3 kg)    Ocular: Sclerae unicteric, pupils equal, round and reactive to light Ear-nose-throat: Oropharynx clear, dentition fair Lymphatic: No cervical, supraclavicular or axillary adenopathy Lungs no rales or rhonchi, good excursion bilaterally Heart regular rate and rhythm, no murmur appreciated Abd soft, nontender, positive bowel sounds, no liver or spleen tip palpated on exam, no fluid wave  MSK no focal spinal tenderness, no joint edema Neuro: non-focal, well-oriented, appropriate affect Breasts: Deferred   Lab Results  Component Value Date    WBC 6.3 11/06/2018   HGB 13.4 11/06/2018   HCT 40.4 11/06/2018   MCV 98.8 11/06/2018   PLT 296 11/06/2018   Lab Results  Component Value Date   FERRITIN 390 (H) 11/06/2018   IRON 85 11/06/2018   TIBC 263 11/06/2018   UIBC 177 11/06/2018   IRONPCTSAT 32 11/06/2018   Lab Results  Component Value Date   RETICCTPCT 1.0 03/21/2018   RBC 4.09 11/06/2018   RETICCTABS 49.62 09/07/2016   No results found for: KPAFRELGTCHN, LAMBDASER, KAPLAMBRATIO No results found for: IGGSERUM, IGA, IGMSERUM No results found for: TOTALPROTELP, ALBUMINELP, A1GS, A2GS, BETS, BETA2SER, GAMS, MSPIKE, SPEI   Chemistry      Component Value Date/Time  NA 141 11/06/2018 0931   NA 137 10/18/2017 1630   NA 140 09/07/2016 1327   K 3.3 (L) 11/06/2018 0931   K 3.4 (L) 09/07/2016 1327   CL 108 11/06/2018 0931   CO2 24 11/06/2018 0931   CO2 22 09/07/2016 1327   BUN 9 11/06/2018 0931   BUN 18 10/18/2017 1630   BUN 13.3 09/07/2016 1327   CREATININE 0.88 11/06/2018 0931   CREATININE 0.70 12/29/2016 1652   CREATININE 0.8 09/07/2016 1327      Component Value Date/Time   CALCIUM 8.7 (L) 11/06/2018 0931   CALCIUM 8.2 (L) 09/07/2016 1327   ALKPHOS 66 11/06/2018 0931   ALKPHOS 54 09/07/2016 1327   AST 19 11/06/2018 0931   AST 11 09/07/2016 1327   ALT 25 11/06/2018 0931   ALT <9 09/07/2016 1327   BILITOT 0.3 11/06/2018 0931   BILITOT 0.41 09/07/2016 1327       Impression and Plan: Megan Davila is a very pleasant 52 yo caucasian female with both B 12 and iron deficiency 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 She is symptomatic with fatigue, chills and increased migraines.  Iron studies are stable but B 12 is still quite low.  She received a B 12 injection today and will make sure to do her monthly injections herself. We will see her in another 3 months and re-evaluate her counts. If she continues to forget we may switch to having her come in for injection.  She will contact our office with any questions  or concerns. We can certainly see her sooner if need be.   Laverna Peace, NP 12/3/20191:12 PM

## 2018-11-07 ENCOUNTER — Other Ambulatory Visit: Payer: 59

## 2018-11-08 DIAGNOSIS — G8929 Other chronic pain: Secondary | ICD-10-CM | POA: Diagnosis not present

## 2018-11-08 DIAGNOSIS — R51 Headache: Secondary | ICD-10-CM | POA: Diagnosis not present

## 2018-11-09 ENCOUNTER — Ambulatory Visit: Payer: 59 | Admitting: Family

## 2018-11-09 ENCOUNTER — Ambulatory Visit: Payer: 59

## 2018-11-19 ENCOUNTER — Other Ambulatory Visit: Payer: 59

## 2018-11-19 ENCOUNTER — Ambulatory Visit: Payer: 59 | Admitting: Family

## 2018-11-19 DIAGNOSIS — R51 Headache: Secondary | ICD-10-CM | POA: Diagnosis not present

## 2018-11-19 DIAGNOSIS — G8929 Other chronic pain: Secondary | ICD-10-CM | POA: Diagnosis not present

## 2018-12-17 DIAGNOSIS — M791 Myalgia, unspecified site: Secondary | ICD-10-CM | POA: Diagnosis not present

## 2018-12-17 DIAGNOSIS — G43709 Chronic migraine without aura, not intractable, without status migrainosus: Secondary | ICD-10-CM | POA: Diagnosis not present

## 2018-12-17 DIAGNOSIS — M542 Cervicalgia: Secondary | ICD-10-CM | POA: Diagnosis not present

## 2018-12-17 DIAGNOSIS — R51 Headache: Secondary | ICD-10-CM | POA: Diagnosis not present

## 2018-12-17 DIAGNOSIS — M5481 Occipital neuralgia: Secondary | ICD-10-CM | POA: Diagnosis not present

## 2018-12-26 DIAGNOSIS — J0100 Acute maxillary sinusitis, unspecified: Secondary | ICD-10-CM | POA: Diagnosis not present

## 2019-01-07 DIAGNOSIS — Z23 Encounter for immunization: Secondary | ICD-10-CM | POA: Diagnosis not present

## 2019-01-07 DIAGNOSIS — G43709 Chronic migraine without aura, not intractable, without status migrainosus: Secondary | ICD-10-CM | POA: Diagnosis not present

## 2019-01-09 ENCOUNTER — Other Ambulatory Visit: Payer: Self-pay | Admitting: Adult Health

## 2019-02-04 ENCOUNTER — Other Ambulatory Visit: Payer: 59

## 2019-02-04 ENCOUNTER — Inpatient Hospital Stay: Payer: 59

## 2019-02-05 ENCOUNTER — Inpatient Hospital Stay: Payer: 59 | Attending: Hematology & Oncology | Admitting: Family

## 2019-02-05 ENCOUNTER — Other Ambulatory Visit: Payer: Self-pay

## 2019-02-05 ENCOUNTER — Other Ambulatory Visit: Payer: Self-pay | Admitting: Family

## 2019-02-05 ENCOUNTER — Inpatient Hospital Stay: Payer: 59

## 2019-02-05 VITALS — BP 128/86 | HR 91 | Temp 98.6°F | Resp 18 | Ht 63.5 in | Wt 175.8 lb

## 2019-02-05 DIAGNOSIS — D500 Iron deficiency anemia secondary to blood loss (chronic): Secondary | ICD-10-CM | POA: Diagnosis not present

## 2019-02-05 DIAGNOSIS — Z903 Acquired absence of stomach [part of]: Secondary | ICD-10-CM

## 2019-02-05 DIAGNOSIS — N920 Excessive and frequent menstruation with regular cycle: Secondary | ICD-10-CM | POA: Diagnosis not present

## 2019-02-05 DIAGNOSIS — K912 Postsurgical malabsorption, not elsewhere classified: Secondary | ICD-10-CM

## 2019-02-05 DIAGNOSIS — D510 Vitamin B12 deficiency anemia due to intrinsic factor deficiency: Secondary | ICD-10-CM | POA: Diagnosis not present

## 2019-02-05 DIAGNOSIS — D508 Other iron deficiency anemias: Secondary | ICD-10-CM

## 2019-02-05 DIAGNOSIS — E538 Deficiency of other specified B group vitamins: Secondary | ICD-10-CM

## 2019-02-05 LAB — IRON AND TIBC
Iron: 111 ug/dL (ref 41–142)
SATURATION RATIOS: 38 % (ref 21–57)
TIBC: 295 ug/dL (ref 236–444)
UIBC: 184 ug/dL (ref 120–384)

## 2019-02-05 LAB — CMP (CANCER CENTER ONLY)
ALBUMIN: 3.6 g/dL (ref 3.5–5.0)
ALT: 12 U/L (ref 0–44)
ANION GAP: 15 (ref 5–15)
AST: 15 U/L (ref 15–41)
Alkaline Phosphatase: 63 U/L (ref 38–126)
BILIRUBIN TOTAL: 0.7 mg/dL (ref 0.3–1.2)
BUN: 10 mg/dL (ref 6–20)
CHLORIDE: 102 mmol/L (ref 98–111)
CO2: 23 mmol/L (ref 22–32)
Calcium: 8.6 mg/dL — ABNORMAL LOW (ref 8.9–10.3)
Creatinine: 0.76 mg/dL (ref 0.44–1.00)
GFR, Est AFR Am: >60 mL/min (ref >60)
GFR, Estimated: >60 mL/min (ref >60)
GLUCOSE: 94 mg/dL (ref 70–99)
POTASSIUM: 3.5 mmol/L (ref 3.5–5.1)
SODIUM: 140 mmol/L (ref 135–145)
Total Protein: 7 g/dL (ref 6.5–8.1)

## 2019-02-05 LAB — CBC WITH DIFFERENTIAL (CANCER CENTER ONLY)
Abs Immature Granulocytes: 0.01 10*3/uL (ref 0.00–0.07)
BASOS ABS: 0 10*3/uL (ref 0.0–0.1)
Basophils Relative: 1 %
EOS ABS: 0.1 10*3/uL (ref 0.0–0.5)
Eosinophils Relative: 2 %
HEMATOCRIT: 45.6 % (ref 36.0–46.0)
Hemoglobin: 14.9 g/dL (ref 12.0–15.0)
IMMATURE GRANULOCYTES: 0 %
LYMPHS ABS: 2.3 10*3/uL (ref 0.7–4.0)
Lymphocytes Relative: 43 %
MCH: 32 pg (ref 26.0–34.0)
MCHC: 32.7 g/dL (ref 30.0–36.0)
MCV: 98.1 fL (ref 80.0–100.0)
Monocytes Absolute: 0.4 10*3/uL (ref 0.1–1.0)
Monocytes Relative: 7 %
NEUTROS PCT: 47 %
NRBC: 0 % (ref 0.0–0.2)
Neutro Abs: 2.6 10*3/uL (ref 1.7–7.7)
PLATELETS: 245 10*3/uL (ref 150–400)
RBC: 4.65 MIL/uL (ref 3.87–5.11)
RDW: 11.4 % — ABNORMAL LOW (ref 11.5–15.5)
WBC: 5.4 10*3/uL (ref 4.0–10.5)

## 2019-02-05 LAB — VITAMIN B12: VITAMIN B 12: 157 pg/mL — AB (ref 180–914)

## 2019-02-05 LAB — FERRITIN: FERRITIN: 266 ng/mL (ref 11–307)

## 2019-02-05 MED ORDER — CYANOCOBALAMIN 1000 MCG/ML IJ SOLN
INTRAMUSCULAR | Status: AC
  Filled 2019-02-05: qty 1

## 2019-02-05 MED ORDER — SODIUM CHLORIDE 0.9 % IV SOLN
Freq: Once | INTRAVENOUS | Status: DC
  Filled 2019-02-05: qty 250

## 2019-02-05 MED ORDER — CYANOCOBALAMIN 1000 MCG/ML IJ SOLN
1000.0000 ug | Freq: Once | INTRAMUSCULAR | Status: AC
  Administered 2019-02-05: 1000 ug via INTRAMUSCULAR

## 2019-02-05 MED ORDER — SODIUM CHLORIDE 0.9 % IV SOLN: 510.0000 mg | Freq: Once | INTRAVENOUS | Status: DC

## 2019-02-05 NOTE — Progress Notes (Signed)
Hematology and Oncology Follow Up Visit  Megan Davila [id number] October 30, 1966 53 y.o. 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 She is feeling fatigued.  B 12 level is low at 157.  She states that she can not afford to get the injection from the pharmacy and would prefer to have it in the office.  Iron studies look good.  No episodes of bleeding, no bruising or petechiae.  No fever, chills, n/v, cough, rash, dizziness, SOB, chest pain, palpitations, abdominal pain or changes in bowel or bladder habits.  She has chronic migraines and will sometimes have associated blurry vision that resolves once the migraine resolves.  No swelling, tenderness, numbness or tingling in her extremities.  No lymphadenopathy noted on exam.  No falls or syncopal episodes.  She has a good appetite and is staying well hydrated. Her weight is stable.   ECOG Performance Status: 1 - Symptomatic but completely ambulatory  Medications:  Allergies as of 02/05/2019      Reactions   Naproxen Anaphylaxis   Monistat [miconazole] Other (See Comments)   "burning"   Sulfa Antibiotics Hives   Tioconazole Itching      Medication List       Accurate as of February 05, 2019  2:14 PM. Always use your most recent med list.        acetaminophen 500 MG tablet Commonly known as:  TYLENOL Take by mouth.   ALPRAZolam 1 MG tablet Commonly known as:  XANAX Take 1 mg by mouth 2 (two) times daily as needed. Anxiety   Armodafinil 250 MG tablet Take 250 mg by mouth every morning.   botulinum toxin Type A 100 units Solr injection Commonly known as:  BOTOX once. Unaware of dose, pt gets q 56mo for migraines   cyanocobalamin 1000 MCG/ML injection Commonly known as:  (VITAMIN B-12) Monthly injection   DEXILANT 60 MG capsule Generic drug:  dexlansoprazole Take 60 mg by mouth daily.   dicyclomine 10 MG capsule Commonly known as:  BENTYL TK 1 C PO TID   diphenhydrAMINE 12.5 MG/5ML elixir Commonly known as:  BENADRYL Take by mouth.   divalproex 500 MG DR tablet Commonly known as:  DEPAKOTE Take 1 tablet (500 mg total) by mouth 2 (two) times daily.   eletriptan 40 MG tablet Commonly known as:  RELPAX Take 1 tablet (40 mg total) by mouth as needed for migraine or headache. May repeat in 2 hours if needed. Max 2 tabs per day or 8 tabs per month.   ergocalciferol 1.25 MG (50000 UT) capsule Commonly known as:  VITAMIN D2 Take 50,000 Units by mouth. She is taking daily M-F - pr pt   Eszopiclone 3 MG Tabs TK 1 T PO QD HS   fexofenadine 180 MG tablet Commonly known as:  ALLEGRA Take 180 mg by mouth daily as needed. allergies   fluticasone 50 MCG/ACT nasal spray Commonly known as:  FLONASE Place 1 spray into both nostrils as needed.   gabapentin 300 MG capsule Commonly known as:  NEURONTIN Take one pill at night for 1 week.  Increase to 2 pills at night for 1 week.  Increase to 3 pills at night until  seen   hydrOXYzine 25 MG tablet Commonly known as:  ATARAX/VISTARIL TAKE 1 TABLET BY MOUTH AS NEEDED FOR HEADACHE UPTO EVERY 8 HOURS   imipramine 50 MG tablet Commonly known as:  TOFRANIL Take 2 tablets by mouth daily.   lansoprazole 30 MG capsule Commonly known as:  PREVACID Take 30 mg by mouth daily as needed. Acid reflux   LINZESS 145 MCG Caps capsule Generic drug:  linaclotide Take 145 mcg by mouth daily.   medroxyPROGESTERone 10 MG tablet Commonly known as:  PROVERA Take 1 tablet (10 mg total) by mouth daily for 10 days.   multivitamin with minerals tablet Take 1 tablet by mouth daily.   nystatin powder Commonly known as:  nystatin Apply topically 2 (two) times  daily.   promethazine 12.5 MG tablet Commonly known as:  PHENERGAN TAKE 1 TABLET(12.5 MG) BY MOUTH EVERY 8 HOURS AS NEEDED FOR NAUSEA OR VOMITING   ranitidine 150 MG capsule Commonly known as:  ZANTAC   thiamine 50 MG tablet Commonly known as:  VITAMIN B-1 daily.   tiZANidine 4 MG capsule Commonly known as:  ZANAFLEX Take 2-4 mg by mouth at bedtime.   venlafaxine XR 150 MG 24 hr capsule Commonly known as:  EFFEXOR-XR Take 1 capsule by mouth daily.   WELLBUTRIN XL 300 MG 24 hr tablet Generic drug:  buPROPion TK 1 T PO  D QAM   zonisamide 100 MG capsule Commonly known as:  ZONEGRAN Take 1 capsule (100 mg total) by mouth 2 (two) times daily.       Allergies:  Allergies  Allergen Reactions  . Naproxen Anaphylaxis  . Monistat [Miconazole] Other (See Comments)    "burning"   . Sulfa Antibiotics Hives  . Tioconazole Itching    Past Medical History, Surgical history, Social history, and Family History were reviewed and updated.  Review of Systems: All other 10 point review of systems is negative.   Physical Exam:  vitals were not taken for this visit.   Wt Readings from Last 3 Encounters:  11/06/18 168 lb 12 oz (76.5 kg)  09/25/18 161 lb 8 oz (73.3 kg)  09/13/18 155 lb (70.3 kg)    Ocular: Sclerae unicteric, pupils equal, round and reactive to light Ear-nose-throat: Oropharynx clear, dentition fair Lymphatic: No cervical, supraclavicular or axillary adenopathy Lungs no rales or rhonchi, good excursion bilaterally Heart regular rate and rhythm, no murmur appreciated Abd soft, nontender, positive bowel sounds, no liver or spleen tip palpated on exam, no fluid wave  MSK no focal spinal tenderness, no joint edema Neuro: non-focal, well-oriented, appropriate affect Breasts: Deferred   Lab Results  Component Value Date   WBC 5.4 02/05/2019   HGB 14.9 02/05/2019   HCT 45.6 02/05/2019   MCV 98.1 02/05/2019   PLT 245 02/05/2019   Lab Results  Component Value  Date   FERRITIN 266 02/05/2019   IRON 111 02/05/2019   TIBC 295 02/05/2019   UIBC 184 02/05/2019   IRONPCTSAT 38 02/05/2019   Lab Results  Component Value Date   RETICCTPCT 1.0 03/21/2018   RBC 4.65 02/05/2019   RETICCTABS 49.62 09/07/2016   No results found for: KPAFRELGTCHN, LAMBDASER, KAPLAMBRATIO No results found for: IGGSERUM, IGA, IGMSERUM No results found for: Odetta Pink, SPEI   Chemistry      Component Value Date/Time   NA 140 02/05/2019 0724   NA 137 10/18/2017 1630   NA 140 09/07/2016 1327   K 3.5 02/05/2019 0724  K 3.4 (L) 09/07/2016 1327   CL 102 02/05/2019 0724   CO2 23 02/05/2019 0724   CO2 22 09/07/2016 1327   BUN 10 02/05/2019 0724   BUN 18 10/18/2017 1630   BUN 13.3 09/07/2016 1327   CREATININE 0.76 02/05/2019 0724   CREATININE 0.70 12/29/2016 1652   CREATININE 0.8 09/07/2016 1327      Component Value Date/Time   CALCIUM 8.6 (L) 02/05/2019 0724   CALCIUM 8.2 (L) 09/07/2016 1327   ALKPHOS 63 02/05/2019 0724   ALKPHOS 54 09/07/2016 1327   AST 15 02/05/2019 0724   AST 11 09/07/2016 1327   ALT 12 02/05/2019 0724   ALT <9 09/07/2016 1327   BILITOT 0.7 02/05/2019 0724   BILITOT 0.41 09/07/2016 1327       Impression and Plan: Megan Davila is a very pleasant 53 yo caucasian female with both B 12 and iron deficiency 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  She is symptomatic as mentioned above.  Iron studies look good but B 12 remains low.  We will get her on a schedule for B 12 every 3 weeks with follow-up in 12 weeks with repeat lab.  She would like to have the injections between follow-ups at [facility] as this is closer to home and work for her.  She will contact our office with any questions or concerns. We can certainly see her sooner if need be.   Laverna Peace, NP 3/3/20202:14 PM

## 2019-02-05 NOTE — Patient Instructions (Addendum)
Cyanocobalamin, Vitamin B12 injection What is this medicine? CYANOCOBALAMIN (sye an oh koe BAL a min) is a man made form of vitamin B12. Vitamin B12 is used in the growth of healthy blood cells, nerve cells, and proteins in the body. It also helps with the metabolism of fats and carbohydrates. This medicine is used to treat people who can not absorb vitamin B12. This medicine may be used for other purposes; ask your health care provider or pharmacist if you have questions. COMMON BRAND NAME(S): B-12 Compliance Kit, B-12 Injection Kit, Cyomin, LA-12, Nutri-Twelve, Physicians EZ Use B-12, Primabalt What should I tell my health care provider before I take this medicine? They need to know if you have any of these conditions: -kidney disease -Leber's disease -megaloblastic anemia -an unusual or allergic reaction to cyanocobalamin, cobalt, other medicines, foods, dyes, or preservatives -pregnant or trying to get pregnant -breast-feeding How should I use this medicine? This medicine is injected into a muscle or deeply under the skin. It is usually given by a health care professional in a clinic or doctor's office. However, your doctor may teach you how to inject yourself. Follow all instructions. Talk to your pediatrician regarding the use of this medicine in children. Special care may be needed. Overdosage: If you think you have taken too much of this medicine contact a poison control center or emergency room at once. NOTE: This medicine is only for you. Do not share this medicine with others. What if I miss a dose? If you are given your dose at a clinic or doctor's office, call to reschedule your appointment. If you give your own injections and you miss a dose, take it as soon as you can. If it is almost time for your next dose, take only that dose. Do not take double or extra doses. What may interact with this medicine? -colchicine -heavy alcohol intake This list may not describe all possible  interactions. Give your health care provider a list of all the medicines, herbs, non-prescription drugs, or dietary supplements you use. Also tell them if you smoke, drink alcohol, or use illegal drugs. Some items may interact with your medicine. What should I watch for while using this medicine? Visit your doctor or health care professional regularly. You may need blood work done while you are taking this medicine. You may need to follow a special diet. Talk to your doctor. Limit your alcohol intake and avoid smoking to get the best benefit. What side effects may I notice from receiving this medicine? Side effects that you should report to your doctor or health care professional as soon as possible: -allergic reactions like skin rash, itching or hives, swelling of the face, lips, or tongue -blue tint to skin -chest tightness, pain -difficulty breathing, wheezing -dizziness -red, swollen painful area on the leg Side effects that usually do not require medical attention (report to your doctor or health care professional if they continue or are bothersome): -diarrhea -headache This list may not describe all possible side effects. Call your doctor for medical advice about side effects. You may report side effects to FDA at 1-800-FDA-1088. Where should I keep my medicine? Keep out of the reach of children. Store at room temperature between 15 and 30 degrees C (59 and 85 degrees F). Protect from light. Throw away any unused medicine after the expiration date. NOTE: This sheet is a summary. It may not cover all possible information. If you have questions about this medicine, talk to your doctor, pharmacist, or   health care provider.  2019 Elsevier/Gold Standard (2008-03-03 22:10:20)  

## 2019-02-07 DIAGNOSIS — G43709 Chronic migraine without aura, not intractable, without status migrainosus: Secondary | ICD-10-CM | POA: Diagnosis not present

## 2019-02-26 ENCOUNTER — Other Ambulatory Visit: Payer: Self-pay

## 2019-02-26 ENCOUNTER — Inpatient Hospital Stay: Payer: 59

## 2019-02-26 ENCOUNTER — Ambulatory Visit: Payer: 59

## 2019-02-26 DIAGNOSIS — K912 Postsurgical malabsorption, not elsewhere classified: Secondary | ICD-10-CM

## 2019-02-26 DIAGNOSIS — D510 Vitamin B12 deficiency anemia due to intrinsic factor deficiency: Secondary | ICD-10-CM | POA: Diagnosis not present

## 2019-02-26 DIAGNOSIS — Z903 Acquired absence of stomach [part of]: Secondary | ICD-10-CM

## 2019-02-26 MED ORDER — CYANOCOBALAMIN 1000 MCG/ML IJ SOLN
1000.0000 ug | Freq: Once | INTRAMUSCULAR | Status: AC
  Administered 2019-02-26: 1000 ug via INTRAMUSCULAR

## 2019-02-26 NOTE — Patient Instructions (Signed)

## 2019-03-18 DIAGNOSIS — G43709 Chronic migraine without aura, not intractable, without status migrainosus: Secondary | ICD-10-CM | POA: Diagnosis not present

## 2019-03-19 ENCOUNTER — Inpatient Hospital Stay: Payer: 59 | Attending: Hematology & Oncology

## 2019-03-19 ENCOUNTER — Ambulatory Visit: Payer: 59

## 2019-03-19 ENCOUNTER — Other Ambulatory Visit: Payer: Self-pay

## 2019-03-19 VITALS — BP 133/72 | HR 90 | Temp 98.4°F | Resp 18

## 2019-03-19 DIAGNOSIS — Z903 Acquired absence of stomach [part of]: Secondary | ICD-10-CM | POA: Diagnosis not present

## 2019-03-19 DIAGNOSIS — D510 Vitamin B12 deficiency anemia due to intrinsic factor deficiency: Secondary | ICD-10-CM | POA: Diagnosis not present

## 2019-03-19 DIAGNOSIS — K912 Postsurgical malabsorption, not elsewhere classified: Secondary | ICD-10-CM | POA: Insufficient documentation

## 2019-03-19 MED ORDER — CYANOCOBALAMIN 1000 MCG/ML IJ SOLN
1000.0000 ug | Freq: Once | INTRAMUSCULAR | Status: AC
  Administered 2019-03-19: 1000 ug via INTRAMUSCULAR

## 2019-03-19 MED ORDER — CYANOCOBALAMIN 1000 MCG/ML IJ SOLN
INTRAMUSCULAR | Status: AC
  Filled 2019-03-19: qty 1

## 2019-03-19 NOTE — Patient Instructions (Signed)
\[id number]\GLOVFIEPPIRJJO, Vitamin B12 injection What is this medicine? CYANOCOBALAMIN (sye an oh koe BAL a min) is a man made form of vitamin B12. Vitamin B12 is used in the growth of healthy blood cells, nerve cells, and proteins in the body. It also helps with the metabolism of fats and carbohydrates. This medicine is used to treat people who can not absorb vitamin B12. This medicine may be used for other purposes; ask your health care provider or pharmacist if you have questions. COMMON BRAND NAME(S): B-12 Compliance Kit, B-12 Injection Kit, Cyomin, LA-12, Nutri-Twelve, Physicians EZ Use B-12, Primabalt What should I tell my health care provider before I take this medicine? They need to know if you have any of these conditions: -kidney disease -Leber's disease -megaloblastic anemia -an unusual or allergic reaction to cyanocobalamin, cobalt, other medicines, foods, dyes, or preservatives -pregnant or trying to get pregnant -breast-feeding How should I use this medicine? This medicine is injected into a muscle or deeply under the skin. It is usually given by a health care professional in a clinic or doctor's office. However, your doctor may teach you how to inject yourself. Follow all instructions. Talk to your pediatrician regarding the use of this medicine in children. Special care may be needed. Overdosage: If you think you have taken too much of this medicine contact a poison control center or emergency room at once. NOTE: This medicine is only for you. Do not share this medicine with others. What if I miss a dose? If you are given your dose at a clinic or doctor's office, call to reschedule your appointment. If you give your own injections and you miss a dose, take it as soon as you can. If it is almost time for your next dose, take only that dose. Do not take double or extra doses. What may interact with this medicine? -colchicine -heavy alcohol intake This list may not describe all  possible interactions. Give your health care provider a list of all the medicines, herbs, non-prescription drugs, or dietary supplements you use. Also tell them if you smoke, drink alcohol, or use illegal drugs. Some items may interact with your medicine. What should I watch for while using this medicine? Visit your doctor or health care professional regularly. You may need blood work done while you are taking this medicine. You may need to follow a special diet. Talk to your doctor. Limit your alcohol intake and avoid smoking to get the best benefit. What side effects may I notice from receiving this medicine? Side effects that you should report to your doctor or health care professional as soon as possible: -allergic reactions like skin rash, itching or hives, swelling of the face, lips, or tongue -blue tint to skin -chest tightness, pain -difficulty breathing, wheezing -dizziness -red, swollen painful area on the leg Side effects that usually do not require medical attention (report to your doctor or health care professional if they continue or are bothersome): -diarrhea -headache This list may not describe all possible side effects. Call your doctor for medical advice about side effects. You may report side effects to FDA at 1-800-FDA-1088. Where should I keep my medicine? Keep out of the reach of children. Store at room temperature between 15 and 30 degrees C (59 and 85 degrees F). Protect from light. Throw away any unused medicine after the expiration date. NOTE: This sheet is a summary. It may not cover all possible information. If you have questions about this medicine, talk to your doctor, pharmacist, or  health care provider.  2019 Elsevier/Gold Standard (2008-03-03 22:10:20)  

## 2019-04-08 DIAGNOSIS — G43709 Chronic migraine without aura, not intractable, without status migrainosus: Secondary | ICD-10-CM | POA: Diagnosis not present

## 2019-04-09 ENCOUNTER — Inpatient Hospital Stay: Payer: 59 | Attending: Hematology & Oncology

## 2019-04-09 ENCOUNTER — Ambulatory Visit: Payer: 59

## 2019-04-09 ENCOUNTER — Other Ambulatory Visit: Payer: Self-pay

## 2019-04-09 VITALS — BP 122/85 | HR 88 | Temp 98.0°F | Resp 18

## 2019-04-09 DIAGNOSIS — K912 Postsurgical malabsorption, not elsewhere classified: Secondary | ICD-10-CM

## 2019-04-09 DIAGNOSIS — Z903 Acquired absence of stomach [part of]: Secondary | ICD-10-CM

## 2019-04-09 DIAGNOSIS — D510 Vitamin B12 deficiency anemia due to intrinsic factor deficiency: Secondary | ICD-10-CM | POA: Diagnosis not present

## 2019-04-09 MED ORDER — CYANOCOBALAMIN 1000 MCG/ML IJ SOLN
1000.0000 ug | Freq: Once | INTRAMUSCULAR | Status: AC
  Administered 2019-04-09: 1000 ug via INTRAMUSCULAR

## 2019-04-09 MED ORDER — CYANOCOBALAMIN 1000 MCG/ML IJ SOLN
INTRAMUSCULAR | Status: AC
  Filled 2019-04-09: qty 1

## 2019-04-09 NOTE — Patient Instructions (Signed)
Cyanocobalamin, Vitamin B12 injection What is this medicine? CYANOCOBALAMIN (sye an oh koe BAL a min) is a man made form of vitamin B12. Vitamin B12 is used in the growth of healthy blood cells, nerve cells, and proteins in the body. It also helps with the metabolism of fats and carbohydrates. This medicine is used to treat people who can not absorb vitamin B12. This medicine may be used for other purposes; ask your health care provider or pharmacist if you have questions. COMMON BRAND NAME(S): B-12 Compliance Kit, B-12 Injection Kit, Cyomin, LA-12, Nutri-Twelve, Physicians EZ Use B-12, Primabalt What should I tell my health care provider before I take this medicine? They need to know if you have any of these conditions: -kidney disease -Leber's disease -megaloblastic anemia -an unusual or allergic reaction to cyanocobalamin, cobalt, other medicines, foods, dyes, or preservatives -pregnant or trying to get pregnant -breast-feeding How should I use this medicine? This medicine is injected into a muscle or deeply under the skin. It is usually given by a health care professional in a clinic or doctor's office. However, your doctor may teach you how to inject yourself. Follow all instructions. Talk to your pediatrician regarding the use of this medicine in children. Special care may be needed. Overdosage: If you think you have taken too much of this medicine contact a poison control center or emergency room at once. NOTE: This medicine is only for you. Do not share this medicine with others. What if I miss a dose? If you are given your dose at a clinic or doctor's office, call to reschedule your appointment. If you give your own injections and you miss a dose, take it as soon as you can. If it is almost time for your next dose, take only that dose. Do not take double or extra doses. What may interact with this medicine? -colchicine -heavy alcohol intake This list may not describe all possible  interactions. Give your health care provider a list of all the medicines, herbs, non-prescription drugs, or dietary supplements you use. Also tell them if you smoke, drink alcohol, or use illegal drugs. Some items may interact with your medicine. What should I watch for while using this medicine? Visit your doctor or health care professional regularly. You may need blood work done while you are taking this medicine. You may need to follow a special diet. Talk to your doctor. Limit your alcohol intake and avoid smoking to get the best benefit. What side effects may I notice from receiving this medicine? Side effects that you should report to your doctor or health care professional as soon as possible: -allergic reactions like skin rash, itching or hives, swelling of the face, lips, or tongue -blue tint to skin -chest tightness, pain -difficulty breathing, wheezing -dizziness -red, swollen painful area on the leg Side effects that usually do not require medical attention (report to your doctor or health care professional if they continue or are bothersome): -diarrhea -headache This list may not describe all possible side effects. Call your doctor for medical advice about side effects. You may report side effects to FDA at 1-800-FDA-1088. Where should I keep my medicine? Keep out of the reach of children. Store at room temperature between 15 and 30 degrees C (59 and 85 degrees F). Protect from light. Throw away any unused medicine after the expiration date. NOTE: This sheet is a summary. It may not cover all possible information. If you have questions about this medicine, talk to your doctor, pharmacist, or   health care provider.  2019 Elsevier/Gold Standard (2008-03-03 22:10:20)  

## 2019-04-19 ENCOUNTER — Other Ambulatory Visit: Payer: Self-pay

## 2019-04-22 ENCOUNTER — Encounter: Payer: Self-pay | Admitting: Obstetrics and Gynecology

## 2019-04-22 ENCOUNTER — Other Ambulatory Visit: Payer: Self-pay

## 2019-04-22 ENCOUNTER — Ambulatory Visit: Payer: 59 | Admitting: Obstetrics and Gynecology

## 2019-04-22 ENCOUNTER — Other Ambulatory Visit (HOSPITAL_COMMUNITY)
Admission: RE | Admit: 2019-04-22 | Discharge: 2019-04-22 | Disposition: A | Discharge status: Home / Self Care | Payer: 59 | Source: Ambulatory Visit | Attending: Obstetrics and Gynecology | Admitting: Obstetrics and Gynecology

## 2019-04-22 VITALS — BP 102/60 | HR 88 | Temp 97.5°F | Resp 14 | Ht 63.75 in | Wt 190.0 lb

## 2019-04-22 DIAGNOSIS — Z113 Encounter for screening for infections with a predominantly sexual mode of transmission: Secondary | ICD-10-CM

## 2019-04-22 DIAGNOSIS — R946 Abnormal results of thyroid function studies: Secondary | ICD-10-CM | POA: Diagnosis not present

## 2019-04-22 DIAGNOSIS — Z01419 Encounter for gynecological examination (general) (routine) without abnormal findings: Secondary | ICD-10-CM | POA: Insufficient documentation

## 2019-04-22 NOTE — Progress Notes (Signed)
53 y.o. G1P1 Divorced Caucasian female here for annual exam.    Wants STD testing.   FSH 64.7 and E2 77.1 on 09/25/18.  LMP was 03/08/19.  Prior LMP was June, 2019.  No hot flashes.   Had a lot of migraines last year.  They are better now.  Working from home and liking it.  Cooking at home, and gaining weight.   Has her iron checked through Dr. Marin Olp.   Sees Dr. Suzette Battiest for low Vit D.   PCP:  Carol Ada, MD   Patient's last menstrual period was 03/08/2019.           Sexually active: Yes.    The current method of family planning is tubal ligation.    Exercising: No.  The patient does not participate in regular exercise at present. Smoker:  no  Health Maintenance: Pap: 09/25/18 Neg:Neg HR HPV, 05/08/18 LEEP showed normal cells, 04/27/18 Colpo showed LGSIL, 04-17-18 LGSIL:Pos HR HPV, 02-17-17 Neg:Neg HR HPV, 11-20-15 Neg:Neg HR HPV History of abnormal Pap:  Yes, 1993 Hx conization of cervix--CIN 3, 10-07-13 pap Ascus:Pos HR HPV;LEEP procedure--LGSIL.05-14-14 pap Neg:Pos HR HPV with repeat pap 10-09-14 Neg:Neg HR HPV. MMG:  10/19/17 BIRADS 1 negative/density b Colonoscopy:  About 5 years ago per patient normal.  Had endoscopy done at the same time [facility].  BMD:   10/19/17  Result  Normal TDaP:  10/07/13 Gardasil:   no HIV and Hep C: 04/17/18 Negative Screening Labs:  PCP   reports that she has never smoked. She has never used smokeless tobacco. She reports current alcohol use. She reports that she does not use drugs.  Past Medical History:  Diagnosis Date  . Abnormal Pap smear   . Anemia   . Anxiety   . Broken toe 12-22-15   middle toe right foot  . Cataract of both eyes   . Depression   . GERD (gastroesophageal reflux disease)   . Headache(784.0)   . Heart murmur   . History of kidney stones   . 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 Migraine   . Perforated bowel ([place])   . Pernicious anemia 06/19/2015  . PONV (postoperative nausea and vomiting)   .  Thyroid disease    hypothyrodism    Past Surgical History:  Procedure Laterality Date  . CATARACT EXTRACTION, BILATERAL Bilateral 02/2016  . CERVICAL BIOPSY  W/ LOOP ELECTRODE EXCISION  11/2013   LGSIL  . 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 CESAREAN SECTION  2008  . CHOLECYSTECTOMY    . GASTRIC BYPASS  2002  . HYSTEROSCOPY W/D&C  10/14/2011   Procedure: DILATATION AND CURETTAGE (D&C) /HYSTEROSCOPY;  Surgeon: Arloa Koh;  Location: [facility];  Service: Gynecology;  Laterality: Bilateral;  . LASIK    . NOSE SURGERY    . STOMACH SURGERY  04/20/2015   dilation of "connection between stomach and intestine", [facility]  . TONSILLECTOMY    . TONSILLECTOMY    . TUBAL LIGATION  2008    Current Outpatient Medications  Medication Sig Dispense Refill  . acetaminophen (TYLENOL) 500 MG tablet Take by mouth every 6 (six) hours as needed.     . ALPRAZolam (XANAX) 1 MG tablet Take 1 mg by mouth 2 (two) times daily as needed. Anxiety      . Armodafinil 250 MG tablet Take 250 mg by mouth every morning.  2  . botulinum toxin Type A (BOTOX) 100 units SOLR injection once.  Unaware of dose, pt gets q 5mo for migraines    . DEXILANT 60 MG capsule Take 60 mg by mouth daily.    Marland Kitchen dicyclomine (BENTYL) 10 MG capsule Take 10 mg by mouth 3 (three) times daily.   0  . diphenhydrAMINE (BENADRYL) 12.5 MG/5ML elixir Take 12.5 mg by mouth every 6 (six) hours as needed.     . divalproex (DEPAKOTE) 500 MG DR tablet Take 1 tablet (500 mg total) by mouth 2 (two) times daily. 180 tablet 4  . doxepin (SINEQUAN) 25 MG capsule TK 1 TO 3 CS PO 45 MINUTES BEFORE SLEEP    . eletriptan (RELPAX) 40 MG tablet Take 1 tablet (40 mg total) by mouth as needed for migraine or headache. May repeat in 2 hours if needed. Max 2 tabs per day or 8 tabs per month. 10 tablet 12  . ergocalciferol (VITAMIN D2) 50000 UNITS capsule Take 50,000 Units by mouth. She is taking daily M-F - pr pt    . Eszopiclone 3 MG  TABS TK 1 T PO QD HS  1  . fexofenadine (ALLEGRA) 180 MG tablet Take 180 mg by mouth daily as needed. allergies     . fluticasone (FLONASE) 50 MCG/ACT nasal spray Place 1 spray into both nostrils as needed.  1  . gabapentin (NEURONTIN) 300 MG capsule Take one pill at night for 1 week.  Increase to 2 pills at night for 1 week.  Increase to 3 pills at night until seen    . hydrOXYzine (ATARAX/VISTARIL) 25 MG tablet TAKE 1 TABLET BY MOUTH AS NEEDED FOR HEADACHE UPTO EVERY 8 HOURS  2  . imipramine (TOFRANIL) 50 MG tablet Take 2 tablets by mouth daily.  0  . lansoprazole (PREVACID) 30 MG capsule Take 30 mg by mouth daily as needed. Acid reflux      . LINZESS 145 MCG CAPS capsule Take 145 mcg by mouth daily.  3  . Multiple Vitamins-Minerals (MULTIVITAMIN WITH MINERALS) tablet Take 1 tablet by mouth daily.      Marland Kitchen nystatin (NYSTATIN) powder Apply topically 2 (two) times daily. 45 g 1  . promethazine (PHENERGAN) 12.5 MG tablet TAKE 1 TABLET(12.5 MG) BY MOUTH EVERY 8 HOURS AS NEEDED FOR NAUSEA OR VOMITING 15 tablet 0  . ranitidine (ZANTAC) 150 MG capsule     . thiamine (VITAMIN B-1) 50 MG tablet daily.  0  . tiZANidine (ZANAFLEX) 4 MG capsule Take 2-4 mg by mouth at bedtime.  0  . venlafaxine XR (EFFEXOR-XR) 150 MG 24 hr capsule Take 1 capsule by mouth daily.  0  . WELLBUTRIN XL 300 MG 24 hr tablet TK 1 T PO  D QAM  1  . zonisamide (ZONEGRAN) 100 MG capsule Take 1 capsule (100 mg total) by mouth 2 (two) times daily. 180 capsule 4   No current facility-administered medications for this visit.     Family History  Problem Relation Age of Onset  . Diabetes Mother   . Heart disease Father   . Social phobia Father   . Psychiatric Illness Father   . Hypertension Father   . Stroke Father   . Dementia Father   . Heart disease Other   . Diabetes Other   . Stroke Other   . Hypertension Maternal Grandmother   . Thyroid disease Maternal Grandmother   . Hypertension Paternal Grandmother   . Stroke  Paternal Grandfather   . Breast cancer Neg Hx     Review of Systems  Constitutional:  Negative.   HENT: Negative.   Eyes: Negative.   Respiratory: Negative.   Cardiovascular: Negative.   Gastrointestinal: Negative.   Endocrine: Negative.   Genitourinary: Negative.   Musculoskeletal: Negative.   Skin: Negative.   Allergic/Immunologic: Negative.   Neurological: Negative.   Hematological: Negative.   Psychiatric/Behavioral: Negative.     Exam:   BP 102/60 (BP Location: Left Arm, Patient Position: Sitting, Cuff Size: Large)   Pulse 88   Temp (!) 97.5 F (36.4 C) (Temporal)   Resp 14   Ht 5' 3.75" (1.619 m)   Wt 190 lb (86.2 kg)   LMP 03/08/2019   BMI 32.87 kg/m     General appearance: alert, cooperative and appears stated age Head: Normocephalic, without obvious abnormality, atraumatic Neck: no adenopathy, supple, symmetrical, trachea midline and thyroid normal to inspection and palpation Lungs: clear to auscultation bilaterally Breasts: normal appearance, no masses or tenderness, No nipple retraction or dimpling, No nipple discharge or bleeding, No axillary or supraclavicular adenopathy Heart: regular rate and rhythm Abdomen: soft, non-tender; no masses, no organomegaly Extremities: extremities normal, atraumatic, no cyanosis or edema Skin: Skin color, texture, turgor normal. No rashes or lesions Lymph nodes: Cervical, supraclavicular, and axillary nodes normal. No abnormal inguinal nodes palpated Neurologic: Grossly normal  Pelvic: External genitalia:  no lesions              Urethra:  normal appearing urethra with no masses, tenderness or lesions              Bartholins and Skenes: normal                 Vagina: normal appearing vagina with normal color and discharge, no lesions              Cervix: no lesions              Pap taken: Yes.   Bimanual Exam:  Uterus:  normal size, contour, position, consistency, mobility, non-tender              Adnexa: no mass,  fullness, tenderness              Rectal exam: Yes.  .  Confirms.              Anus:  normal sphincter tone, no lesions  Chaperone was present for exam.  Assessment:   Well woman visit with normal exam. Hx LGSIL and positive HR HPV.  Hx conization and LEEP x 2.  Last LEEP with normal final pathology report.  Status post BTL.  Headaches, anxiety.  Perimenopausal female.  Weight gain.  Hx low vit D.   Plan: Mammogram screening due.  She will schedule.  Recommended self breast awareness. Pap and HR HPV as above. Guidelines for Calcium, Vitamin D, regular exercise program including cardiovascular and weight bearing exercise. STD screening, TSH, cholesterol panel.  Follow up annually and prn.   After visit summary provided.

## 2019-04-22 NOTE — Patient Instructions (Signed)

## 2019-04-23 LAB — HEP, RPR, HIV PANEL
HIV Screen 4th Generation wRfx: NONREACTIVE
Hepatitis B Surface Ag: NEGATIVE
RPR Ser Ql: NONREACTIVE

## 2019-04-23 LAB — LIPID PANEL
Chol/HDL Ratio: 2.9 ratio (ref 0.0–4.4)
Cholesterol, Total: 161 mg/dL (ref 100–199)
HDL: 56 mg/dL (ref >39)
LDL Calculated: 87 mg/dL (ref 0–99)
Triglycerides: 90 mg/dL (ref 0–149)
VLDL Cholesterol Cal: 18 mg/dL (ref 5–40)

## 2019-04-23 LAB — HSV(HERPES SIMPLEX VRS) I + II AB-IGG
HSV 1 Glycoprotein G Ab, IgG: <0.91 index (ref 0.00–0.90)
HSV 2 IgG, Type Spec: <0.91 index (ref 0.00–0.90)

## 2019-04-23 LAB — HEPATITIS C ANTIBODY: Hep C Virus Ab: <0.1 s/co ratio (ref 0.0–0.9)

## 2019-04-23 LAB — TSH: TSH: 6.03 u[IU]/mL — ABNORMAL HIGH (ref 0.450–4.500)

## 2019-04-24 LAB — CYTOLOGY - PAP
Adequacy: ABSENT
Chlamydia: NEGATIVE
HPV: NOT DETECTED
Neisseria Gonorrhea: NEGATIVE
Trichomonas: NEGATIVE

## 2019-04-27 ENCOUNTER — Other Ambulatory Visit: Payer: Self-pay | Admitting: Diagnostic Neuroimaging

## 2019-04-27 LAB — T3, FREE: T3, Free: 2.4 pg/mL (ref 2.0–4.4)

## 2019-04-27 LAB — SPECIMEN STATUS REPORT

## 2019-04-27 LAB — T4, FREE: Free T4: 0.75 ng/dL — ABNORMAL LOW (ref 0.82–1.77)

## 2019-04-30 ENCOUNTER — Inpatient Hospital Stay: Payer: 59

## 2019-04-30 ENCOUNTER — Telehealth: Payer: Self-pay | Admitting: Obstetrics and Gynecology

## 2019-04-30 ENCOUNTER — Other Ambulatory Visit: Payer: Self-pay

## 2019-04-30 ENCOUNTER — Ambulatory Visit: Payer: 59

## 2019-04-30 ENCOUNTER — Encounter: Payer: Self-pay | Admitting: Obstetrics and Gynecology

## 2019-04-30 DIAGNOSIS — E039 Hypothyroidism, unspecified: Secondary | ICD-10-CM

## 2019-04-30 DIAGNOSIS — Z903 Acquired absence of stomach [part of]: Secondary | ICD-10-CM

## 2019-04-30 DIAGNOSIS — D510 Vitamin B12 deficiency anemia due to intrinsic factor deficiency: Secondary | ICD-10-CM | POA: Diagnosis not present

## 2019-04-30 DIAGNOSIS — K912 Postsurgical malabsorption, not elsewhere classified: Secondary | ICD-10-CM

## 2019-04-30 MED ORDER — CYANOCOBALAMIN 1000 MCG/ML IJ SOLN
1000.0000 ug | Freq: Once | INTRAMUSCULAR | Status: AC
  Administered 2019-04-30: 1000 ug via INTRAMUSCULAR

## 2019-04-30 MED ORDER — CYANOCOBALAMIN 1000 MCG/ML IJ SOLN
INTRAMUSCULAR | Status: AC
  Filled 2019-04-30: qty 1

## 2019-04-30 NOTE — Telephone Encounter (Signed)
Patient sent the following correspondence through [facility]. Routing to triage to assist patient with request.  What can I do to lower my ths level? Is this my thyroid? Would that be why I keep gaining weight?

## 2019-04-30 NOTE — Telephone Encounter (Signed)
Patient has seen lab results on MyChart. She is very concerned about TSH results being elevated and her weight gain. Advised patient will have Dr.Silva review all labs and call back with further recommendations. Routed to provider to review labs from 04-22-2019.

## 2019-04-30 NOTE — Patient Instructions (Signed)

## 2019-05-01 NOTE — Telephone Encounter (Signed)
Patient calling to follow up on previous message. 

## 2019-05-02 ENCOUNTER — Encounter: Payer: Self-pay | Admitting: Obstetrics and Gynecology

## 2019-05-02 MED ORDER — LEVOTHYROXINE SODIUM 50 MCG PO TABS: 50.0000 ug | ORAL_TABLET | Freq: Every day | ORAL | 1 refills | Status: DC

## 2019-05-02 NOTE — Telephone Encounter (Signed)
Left message to call Wah Sabic, RN at [facility] [phone].   

## 2019-05-02 NOTE — Telephone Encounter (Signed)
-----   Message from Nunzio Cobbs, MD sent at 05/02/2019 10:23 AM EDT ----- Please contact patient with final follow up of her labs.  Her thyroid function looks like hypothyroidism.  She needs to start Synthroid 50 mcg daily and then have her thyroid function checked in 4 - 6 weeks.  She can do that through this office or have Dr. Suzette Battiest, her endocrinologist follow her for this.   Her STD testing is all negative for HIV, syphilis, hep B and C, trichomonas, gonorrhea and chlamydia.   Her pap is normal and her HR HPV is negative.  Please have her do her next pap and HR HPV with me in 6 months  She will need to make this appointment.  Place in recall for November 2020.   Her cholesterol is normal.

## 2019-05-02 NOTE — Telephone Encounter (Signed)
Spoke with patient, advised as seen below per Dr. Quincy Simmonds. Patient request to f/u with Dr. Quincy Simmonds. Lab appt scheduled for 06/18/19 at 3:30pm Future lab order placed for TSH. Rx for synthroid 50 mcg po daily #30/1Rf to verified pharmacy. OV scheduled for repeat pap & hpv on 10/14/19 at 4pm 06 recall placed.   Patient verbalizes understanding and is agreeable.   Encounter closed.

## 2019-05-02 NOTE — Telephone Encounter (Signed)
Please see results note regarding her labs.  She is showing signs of early hypothyroidism.  I recommend Synthroid 50 mcg daily and a recheck in 4 - 6 weeks.  This can also be managed through her endocrinologist.   Her symptoms of fatigue may be partially explained by low thyroid function, but not all of the symptoms she is presenting.  I recommend she contact her PCP.   Cc- Marisa Sprinkles

## 2019-05-02 NOTE — Telephone Encounter (Signed)
Patient sent the following correspondence through [facility]. Routing to triage to assist patient with request.  I have sent a message to Dr. Harolyn Rutherford. The nurse called me back a few days ago & said the doctor would review my results & they would get back to me, no one has. I have left a message with the receptionist. I have not felt well. I am extremely fatigued and have no energy. My entire body aches & I have a migraine. Could this be the side effects of that low level on my bloodwork or would it be something else? I also have pressure behind my eyes & drainage, kind of like a sinus infection. Please call me. Thank you.  Phone [phone].

## 2019-05-06 ENCOUNTER — Ambulatory Visit: Payer: 59 | Admitting: Diagnostic Neuroimaging

## 2019-05-07 ENCOUNTER — Other Ambulatory Visit: Payer: Self-pay | Admitting: Family

## 2019-05-21 ENCOUNTER — Telehealth: Payer: Self-pay | Admitting: *Deleted

## 2019-05-21 NOTE — Telephone Encounter (Signed)
Call received from patient requesting to have labs drawn at [facility] and to have appt with Dr Marin Olp and injection appt two days after labs.  Message sent to scheduling.

## 2019-05-22 ENCOUNTER — Other Ambulatory Visit: Payer: 59

## 2019-05-22 ENCOUNTER — Ambulatory Visit: Payer: 59 | Admitting: Hematology & Oncology

## 2019-05-22 ENCOUNTER — Ambulatory Visit: Payer: 59

## 2019-05-24 ENCOUNTER — Telehealth: Payer: Self-pay | Admitting: *Deleted

## 2019-05-24 ENCOUNTER — Inpatient Hospital Stay: Payer: 59 | Attending: Hematology & Oncology

## 2019-05-24 ENCOUNTER — Other Ambulatory Visit: Payer: Self-pay

## 2019-05-24 ENCOUNTER — Other Ambulatory Visit: Payer: Self-pay | Admitting: Obstetrics and Gynecology

## 2019-05-24 DIAGNOSIS — D510 Vitamin B12 deficiency anemia due to intrinsic factor deficiency: Secondary | ICD-10-CM | POA: Diagnosis present

## 2019-05-24 DIAGNOSIS — E876 Hypokalemia: Secondary | ICD-10-CM | POA: Diagnosis not present

## 2019-05-24 DIAGNOSIS — K912 Postsurgical malabsorption, not elsewhere classified: Secondary | ICD-10-CM

## 2019-05-24 DIAGNOSIS — E039 Hypothyroidism, unspecified: Secondary | ICD-10-CM | POA: Diagnosis not present

## 2019-05-24 DIAGNOSIS — D508 Other iron deficiency anemias: Secondary | ICD-10-CM

## 2019-05-24 DIAGNOSIS — N920 Excessive and frequent menstruation with regular cycle: Secondary | ICD-10-CM | POA: Insufficient documentation

## 2019-05-24 DIAGNOSIS — D500 Iron deficiency anemia secondary to blood loss (chronic): Secondary | ICD-10-CM | POA: Insufficient documentation

## 2019-05-24 LAB — FERRITIN: Ferritin: 195 ng/mL (ref 11–307)

## 2019-05-24 LAB — VITAMIN B12: Vitamin B-12: 244 pg/mL (ref 180–914)

## 2019-05-24 LAB — IRON AND TIBC
Iron: 89 ug/dL (ref 41–142)
Saturation Ratios: 30 % (ref 21–57)
TIBC: 300 ug/dL (ref 236–444)
UIBC: 211 ug/dL (ref 120–384)

## 2019-05-24 LAB — CMP (CANCER CENTER ONLY)
ALT: 20 U/L (ref 0–44)
AST: 18 U/L (ref 15–41)
Albumin: 3.6 g/dL (ref 3.5–5.0)
Alkaline Phosphatase: 61 U/L (ref 38–126)
Anion gap: 10 (ref 5–15)
BUN: 12 mg/dL (ref 6–20)
CO2: 27 mmol/L (ref 22–32)
Calcium: 8.4 mg/dL — ABNORMAL LOW (ref 8.9–10.3)
Chloride: 99 mmol/L (ref 98–111)
Creatinine: 0.78 mg/dL (ref 0.44–1.00)
GFR, Est AFR Am: >60 mL/min (ref >60)
GFR, Estimated: >60 mL/min (ref >60)
Glucose, Bld: 78 mg/dL (ref 70–99)
Potassium: 3 mmol/L — CL (ref 3.5–5.1)
Sodium: 136 mmol/L (ref 135–145)
Total Bilirubin: 0.5 mg/dL (ref 0.3–1.2)
Total Protein: 6.5 g/dL (ref 6.5–8.1)

## 2019-05-24 LAB — CBC WITH DIFFERENTIAL (CANCER CENTER ONLY)
Abs Immature Granulocytes: 0.03 10*3/uL (ref 0.00–0.07)
Basophils Absolute: 0.1 10*3/uL (ref 0.0–0.1)
Basophils Relative: 1 %
Eosinophils Absolute: 0.1 10*3/uL (ref 0.0–0.5)
Eosinophils Relative: 1 %
HCT: 43.1 % (ref 36.0–46.0)
Hemoglobin: 14 g/dL (ref 12.0–15.0)
Immature Granulocytes: 0 %
Lymphocytes Relative: 44 %
Lymphs Abs: 3.9 10*3/uL (ref 0.7–4.0)
MCH: 32.3 pg (ref 26.0–34.0)
MCHC: 32.5 g/dL (ref 30.0–36.0)
MCV: 99.5 fL (ref 80.0–100.0)
Monocytes Absolute: 0.7 10*3/uL (ref 0.1–1.0)
Monocytes Relative: 8 %
Neutro Abs: 4.1 10*3/uL (ref 1.7–7.7)
Neutrophils Relative %: 46 %
Platelet Count: 278 10*3/uL (ref 150–400)
RBC: 4.33 MIL/uL (ref 3.87–5.11)
RDW: 12 % (ref 11.5–15.5)
WBC Count: 8.9 10*3/uL (ref 4.0–10.5)
nRBC: 0 % (ref 0.0–0.2)

## 2019-05-24 NOTE — Telephone Encounter (Signed)
Message received from patient requesting iron results.  Call placed back to patient and patient given iron results.  Pt requests that iron/ferritin/vit b12 results to be sent to her PCP.  Labs sent per pt.'s request.  Pt appreciative of call back and has no further questions at this time.

## 2019-05-24 NOTE — Telephone Encounter (Signed)
Patient notified that k level is low and to contact her PCP, Dr. Tamala Julian regarding low potassium per order of S. [place] NP. CBC and CMET sent to Dr. Loletha Grayer Smith's office.  Patient appreciative of call and states that she will call Dr. Doretha Sou office now.

## 2019-05-24 NOTE — Telephone Encounter (Signed)
Jory Ee NP notified of K-3.0.  Order received to send CMET results to patient's PCP.  Pt also notified of results.

## 2019-05-27 LAB — TSH: TSH: 5.61 u[IU]/mL — ABNORMAL HIGH (ref 0.450–4.500)

## 2019-05-29 ENCOUNTER — Inpatient Hospital Stay: Payer: 59

## 2019-05-29 ENCOUNTER — Other Ambulatory Visit: Payer: Self-pay

## 2019-05-29 ENCOUNTER — Encounter: Payer: Self-pay | Admitting: Hematology & Oncology

## 2019-05-29 ENCOUNTER — Inpatient Hospital Stay (HOSPITAL_BASED_OUTPATIENT_CLINIC_OR_DEPARTMENT_OTHER): Payer: 59 | Admitting: Hematology & Oncology

## 2019-05-29 VITALS — BP 133/89 | HR 92 | Temp 99.3°F | Resp 20 | Wt 184.0 lb

## 2019-05-29 DIAGNOSIS — N920 Excessive and frequent menstruation with regular cycle: Secondary | ICD-10-CM | POA: Diagnosis not present

## 2019-05-29 DIAGNOSIS — D500 Iron deficiency anemia secondary to blood loss (chronic): Secondary | ICD-10-CM | POA: Diagnosis not present

## 2019-05-29 DIAGNOSIS — D510 Vitamin B12 deficiency anemia due to intrinsic factor deficiency: Secondary | ICD-10-CM

## 2019-05-29 DIAGNOSIS — E039 Hypothyroidism, unspecified: Secondary | ICD-10-CM | POA: Diagnosis not present

## 2019-05-29 DIAGNOSIS — E876 Hypokalemia: Secondary | ICD-10-CM

## 2019-05-29 DIAGNOSIS — K912 Postsurgical malabsorption, not elsewhere classified: Secondary | ICD-10-CM

## 2019-05-29 MED ORDER — CYANOCOBALAMIN 1000 MCG/ML IJ SOLN
INTRAMUSCULAR | Status: AC
  Filled 2019-05-29: qty 1

## 2019-05-29 MED ORDER — CYANOCOBALAMIN 1000 MCG/ML IJ SOLN
1000.0000 ug | Freq: Once | INTRAMUSCULAR | Status: AC
  Administered 2019-05-29: 1000 ug via INTRAMUSCULAR

## 2019-05-29 MED ORDER — POTASSIUM CHLORIDE CRYS ER 20 MEQ PO TBCR: 20.0000 meq | EXTENDED_RELEASE_TABLET | Freq: Every day | ORAL | 0 refills | Status: DC

## 2019-05-29 NOTE — Progress Notes (Signed)
Hematology and Oncology Follow Up Visit  Megan Davila [id number] 1966-02-22 53 y.o. 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  She does not feel all that good.  She feels tired.  She had lab work done a week ago.  There is no problems with respect to her iron.  Her iron levels showed a ferritin of 195 with iron saturation of 30%.  She had a vitamin B12 level of 244.  We will give her the B12 here today.  She apparently was found to have some hypothyroidism.  She is now on Synthroid at 50 mcg.  She is worried about all the weight as she is gaining.  I am not sure to tell her about that.  I really cannot see anything that would be a blood problem that would cause her weight gain.  I told her that she probably needs to go back to [facility] who did her weight loss surgery.  Maybe they can advise her.  She does have some slight hypokalemia.  Potassium is 3.  I will call in some potassium for she will take daily for 10 days.  There is been no bleeding.  She has had regular monthly cycles.  Her mammogram was last done back in 2018 for what we have in our record.  I am not sure if she has had one done elsewhere.  Overall, her performance status is ECOG 1.    Medications:  Allergies as of 05/29/2019      Reactions   Naproxen Anaphylaxis   Monistat [miconazole] Other (See Comments)   "burning"   Sulfa Antibiotics Hives   Tioconazole Itching      Medication List       Accurate as of May 29, 2019  2:11 PM. If you have any questions, ask your nurse or doctor.        acetaminophen 500 MG tablet Commonly known as: TYLENOL Take by mouth every 6 (six) hours as  needed.   Aimovig 140 MG/ML Soaj Generic drug: Erenumab-aooe every 30 (thirty) days.   ALPRAZolam 1 MG tablet Commonly known as: XANAX Take 1 mg by mouth 2 (two) times daily as needed. Anxiety   Armodafinil 250 MG tablet Take 250 mg by mouth every morning.   baclofen 10 MG tablet Commonly known as: LIORESAL Take by mouth.   botulinum toxin Type A 100 units Solr injection Commonly known as: BOTOX once. Unaware of dose, pt gets q 67mo for migraines   Dexilant 60 MG capsule Generic drug: dexlansoprazole Take 60 mg by mouth daily.   dicyclomine 10 MG capsule Commonly known as: BENTYL Take 10 mg by mouth 3 (three) times daily.   diphenhydrAMINE 12.5 MG/5ML elixir Commonly known as: BENADRYL Take 12.5 mg by mouth every 6 (six) hours as needed.   divalproex 500 MG DR tablet Commonly known as: DEPAKOTE Take 1 tablet (500 mg total) by mouth 2 (two) times daily.   doxepin 25 MG capsule Commonly known as: SINEQUAN TK 1 TO 3 CS PO 45 MINUTES BEFORE SLEEP   eletriptan 40 MG tablet Commonly known as: RELPAX Take 1 tablet (40 mg total) by mouth as needed for migraine or headache.  May repeat in 2 hours if needed. Max 2 tabs per day or 8 tabs per month.   ergocalciferol 1.25 MG (50000 UT) capsule Commonly known as: VITAMIN D2 Take 50,000 Units by mouth. She is taking daily M-F - pr pt   Eszopiclone 3 MG Tabs TK 1 T PO QD HS   fexofenadine 180 MG tablet Commonly known as: ALLEGRA Take 180 mg by mouth daily as needed. allergies   fluticasone 50 MCG/ACT nasal spray Commonly known as: FLONASE Place 1 spray into both nostrils as needed.   gabapentin 300 MG capsule Commonly known as: NEURONTIN Take one pill at night for 1 week.  Increase to 2 pills at night for 1 week.  Increase to 3 pills at night until seen   hydrOXYzine 25 MG tablet Commonly known as: ATARAX/VISTARIL TAKE 1 TABLET BY MOUTH AS NEEDED FOR HEADACHE UPTO EVERY 8 HOURS   imipramine 50 MG tablet Commonly  known as: TOFRANIL Take 2 tablets by mouth daily.   lansoprazole 30 MG capsule Commonly known as: PREVACID Take 30 mg by mouth daily as needed. Acid reflux   levothyroxine 50 MCG tablet Commonly known as: Synthroid Take 1 tablet (50 mcg total) by mouth daily before breakfast.   Linzess 145 MCG Caps capsule Generic drug: linaclotide Take 145 mcg by mouth daily.   multivitamin with minerals tablet Take 1 tablet by mouth daily.   nystatin powder Commonly known as: nystatin Apply topically 2 (two) times daily. What changed:   when to take this  reasons to take this   promethazine 12.5 MG tablet Commonly known as: PHENERGAN TAKE 1 TABLET(12.5 MG) BY MOUTH EVERY 8 HOURS AS NEEDED FOR NAUSEA OR VOMITING   ranitidine 150 MG capsule Commonly known as: ZANTAC   thiamine 50 MG tablet Commonly known as: VITAMIN B-1 daily.   tiZANidine 4 MG capsule Commonly known as: ZANAFLEX Take 2-4 mg by mouth at bedtime.   Ubrelvy 50 MG Tabs Generic drug: Ubrogepant daily.   venlafaxine XR 150 MG 24 hr capsule Commonly known as: EFFEXOR-XR Take 1 capsule by mouth daily.   Wellbutrin XL 300 MG 24 hr tablet Generic drug: buPROPion TK 1 T PO  D QAM   zonisamide 100 MG capsule Commonly known as: ZONEGRAN Take 1 capsule (100 mg total) by mouth 2 (two) times daily.       Allergies:  Allergies  Allergen Reactions  . Naproxen Anaphylaxis  . Monistat [Miconazole] Other (See Comments)    "burning"   . Sulfa Antibiotics Hives  . Tioconazole Itching    Past Medical History, Surgical history, Social history, and Family History were reviewed and updated.  Review of Systems: All other 10 point review of systems is negative.   Physical Exam:  weight is 184 lb (83.5 kg). Her oral temperature is 99.3 F (37.4 C). Her blood pressure is 133/89 and her pulse is 92. Her respiration is 20 and oxygen saturation is 100%.   Wt Readings from Last 3 Encounters:  05/29/19 184 lb (83.5 kg)   04/22/19 190 lb (86.2 kg)  02/05/19 175 lb 12.8 oz (79.7 kg)    Ocular: Sclerae unicteric, pupils equal, round and reactive to light Ear-nose-throat: Oropharynx clear, dentition fair Lymphatic: No cervical, supraclavicular or axillary adenopathy Lungs no rales or rhonchi, good excursion bilaterally Heart regular rate and rhythm, no murmur appreciated Abd soft, nontender, positive bowel sounds, no liver or spleen tip palpated on exam, no fluid wave  MSK no focal spinal tenderness, no joint  edema Neuro: non-focal, well-oriented, appropriate affect Breasts: Deferred   Lab Results  Component Value Date   WBC 8.9 05/24/2019   HGB 14.0 05/24/2019   HCT 43.1 05/24/2019   MCV 99.5 05/24/2019   PLT 278 05/24/2019   Lab Results  Component Value Date   FERRITIN 195 05/24/2019   IRON 89 05/24/2019   TIBC 300 05/24/2019   UIBC 211 05/24/2019   IRONPCTSAT 30 05/24/2019   Lab Results  Component Value Date   RETICCTPCT 1.0 03/21/2018   RBC 4.33 05/24/2019   RETICCTABS 49.62 09/07/2016   No results found for: KPAFRELGTCHN, LAMBDASER, KAPLAMBRATIO No results found for: IGGSERUM, IGA, IGMSERUM No results found for: Odetta Pink, SPEI   Chemistry      Component Value Date/Time   NA 136 05/24/2019 1121   NA 137 10/18/2017 1630   NA 140 09/07/2016 1327   K 3.0 (LL) 05/24/2019 1121   K 3.4 (L) 09/07/2016 1327   CL 99 05/24/2019 1121   CO2 27 05/24/2019 1121   CO2 22 09/07/2016 1327   BUN 12 05/24/2019 1121   BUN 18 10/18/2017 1630   BUN 13.3 09/07/2016 1327   CREATININE 0.78 05/24/2019 1121   CREATININE 0.70 12/29/2016 1652   CREATININE 0.8 09/07/2016 1327      Component Value Date/Time   CALCIUM 8.4 (L) 05/24/2019 1121   CALCIUM 8.2 (L) 09/07/2016 1327   ALKPHOS 61 05/24/2019 1121   ALKPHOS 54 09/07/2016 1327   AST 18 05/24/2019 1121   AST 11 09/07/2016 1327   ALT 20 05/24/2019 1121   ALT <9 09/07/2016 1327   BILITOT  0.5 05/24/2019 1121   BILITOT 0.41 09/07/2016 1327       Impression and Plan: Ms. Cabanilla is a very pleasant 53 yo caucasian female with both B 12 and iron deficiency 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   Again, I would all have a good idea as to why she has the T and the weight gain.  I would not think is from the hypothyroidism but this is always a possibility.  We will give her her B12 injection while she is here today.  We will see her back in about 4 weeks or so.  I think we have to have a little bit closer follow-up with her given the fact that she has all these symptoms.  [name], MD 6/24/20202:11 PM

## 2019-05-30 ENCOUNTER — Telehealth: Payer: Self-pay

## 2019-05-30 NOTE — Telephone Encounter (Signed)
Spoke with patient. Reviewed TSH results per Dr.Silva's note below. Patient would like Dr.Silva to manage thyroid. Patient understands it may take some time to regulate. She will keep 06-18-19 lab appointment to recheck thyroid. Advised would notify Dr.Silva and if she wants to change appointment I would call her back. Routed to provider.

## 2019-05-30 NOTE — Telephone Encounter (Signed)
-----   Message from Nunzio Cobbs, MD sent at 05/29/2019 12:48 PM EDT ----- Please contact patient in follow up to her TSH.  She had this drawn early when she was having other blood work done at another office.  (I originally recommended doing this in July.) Her TSH is still elevated, but her free T4 was not drawn. She has been taking her thyroid medication for only about 3 weeks.  It does take some time for this to regulate.  Please ask her if should would like for me to manage this or if she would like to see Dr. Suzette Battiest, her endocrinologist.  I am happy to help either way.

## 2019-05-30 NOTE — Telephone Encounter (Signed)
I would not make any alterations in her current Synthroid dosage just yet.  Please keep the lab appointment for July.  Ok to close encounter.

## 2019-06-14 ENCOUNTER — Telehealth: Payer: Self-pay | Admitting: Obstetrics and Gynecology

## 2019-06-14 NOTE — Telephone Encounter (Signed)
Patient is stating her medication is not working and would to talk with a nurse.

## 2019-06-14 NOTE — Telephone Encounter (Signed)
Spoke with patient. She states thyroid medication "is not working". I asked what she meant by that. Patient states she eats very little everyday and gains weight daily. She doesn't feel any better. Advised patient she has only been taking Levothyroxine 5-6 weeks and may take a little more time. She has lab appt. 07-02-19.   She also stated one of her other doctors said if she is taking "generic" thyroid medication then she is only getting 80% of the true drug--and with her gastric bypass, she's getting even less. Patient requested name brand Rx for Levothyroxine and a higher strength.  Advised patient, Dr.Silva would not adjust medication before follow up lab visit. Offered to move up appointment but she is going to be out of town. She has made an appointment to see Dr.Balan regarding her hypothyroidism several weeks from now. Patient thanked me for my time stating she's just frustrated but she felt better after talking with me.  Routed to provider to sign and close encounter.

## 2019-06-14 NOTE — Telephone Encounter (Signed)
I agree with the consultation with Dr. Chalmers Cater.  Regulating her thyroid may be a challenge due to her weight loss surgery and absorption issues.  I know that Megan Davila has concerns about her weight gain. Thank you for trying to have her come in earlier for the lab visit.  I have closed the encounter.

## 2019-06-18 ENCOUNTER — Other Ambulatory Visit: Payer: Self-pay

## 2019-06-28 ENCOUNTER — Other Ambulatory Visit: Payer: Self-pay

## 2019-07-02 ENCOUNTER — Other Ambulatory Visit (INDEPENDENT_AMBULATORY_CARE_PROVIDER_SITE_OTHER): Payer: 59

## 2019-07-02 ENCOUNTER — Other Ambulatory Visit: Payer: Self-pay

## 2019-07-02 DIAGNOSIS — E039 Hypothyroidism, unspecified: Secondary | ICD-10-CM

## 2019-07-03 ENCOUNTER — Other Ambulatory Visit: Payer: Self-pay | Admitting: Obstetrics and Gynecology

## 2019-07-03 ENCOUNTER — Inpatient Hospital Stay: Payer: 59 | Attending: Hematology & Oncology

## 2019-07-03 ENCOUNTER — Other Ambulatory Visit: Payer: 59

## 2019-07-03 ENCOUNTER — Other Ambulatory Visit: Payer: Self-pay

## 2019-07-03 DIAGNOSIS — N920 Excessive and frequent menstruation with regular cycle: Secondary | ICD-10-CM | POA: Diagnosis not present

## 2019-07-03 DIAGNOSIS — D500 Iron deficiency anemia secondary to blood loss (chronic): Secondary | ICD-10-CM

## 2019-07-03 DIAGNOSIS — D510 Vitamin B12 deficiency anemia due to intrinsic factor deficiency: Secondary | ICD-10-CM

## 2019-07-03 LAB — CBC WITH DIFFERENTIAL (CANCER CENTER ONLY)
Abs Immature Granulocytes: 0.02 10*3/uL (ref 0.00–0.07)
Basophils Absolute: 0 10*3/uL (ref 0.0–0.1)
Basophils Relative: 1 %
Eosinophils Absolute: 0.1 10*3/uL (ref 0.0–0.5)
Eosinophils Relative: 2 %
HCT: 44.6 % (ref 36.0–46.0)
Hemoglobin: 14.4 g/dL (ref 12.0–15.0)
Immature Granulocytes: 0 %
Lymphocytes Relative: 31 %
Lymphs Abs: 2.3 10*3/uL (ref 0.7–4.0)
MCH: 31.8 pg (ref 26.0–34.0)
MCHC: 32.3 g/dL (ref 30.0–36.0)
MCV: 98.5 fL (ref 80.0–100.0)
Monocytes Absolute: 0.4 10*3/uL (ref 0.1–1.0)
Monocytes Relative: 6 %
Neutro Abs: 4.4 10*3/uL (ref 1.7–7.7)
Neutrophils Relative %: 60 %
Platelet Count: 275 10*3/uL (ref 150–400)
RBC: 4.53 MIL/uL (ref 3.87–5.11)
RDW: 11.7 % (ref 11.5–15.5)
WBC Count: 7.3 10*3/uL (ref 4.0–10.5)
nRBC: 0 % (ref 0.0–0.2)

## 2019-07-03 LAB — CMP (CANCER CENTER ONLY)
ALT: 10 U/L (ref 0–44)
AST: 14 U/L — ABNORMAL LOW (ref 15–41)
Albumin: 3.5 g/dL (ref 3.5–5.0)
Alkaline Phosphatase: 71 U/L (ref 38–126)
Anion gap: 9 (ref 5–15)
BUN: 14 mg/dL (ref 6–20)
CO2: 27 mmol/L (ref 22–32)
Calcium: 8.8 mg/dL — ABNORMAL LOW (ref 8.9–10.3)
Chloride: 101 mmol/L (ref 98–111)
Creatinine: 0.97 mg/dL (ref 0.44–1.00)
GFR, Est AFR Am: >60 mL/min (ref >60)
GFR, Estimated: >60 mL/min (ref >60)
Glucose, Bld: 115 mg/dL — ABNORMAL HIGH (ref 70–99)
Potassium: 3.9 mmol/L (ref 3.5–5.1)
Sodium: 137 mmol/L (ref 135–145)
Total Bilirubin: 0.3 mg/dL (ref 0.3–1.2)
Total Protein: 6.7 g/dL (ref 6.5–8.1)

## 2019-07-03 LAB — RETICULOCYTES
Immature Retic Fract: 3.2 % (ref 2.3–15.9)
RBC.: 4.52 MIL/uL (ref 3.87–5.11)
Retic Count, Absolute: 51.5 10*3/uL (ref 19.0–186.0)
Retic Ct Pct: 1.1 % (ref 0.4–3.1)

## 2019-07-03 LAB — FERRITIN: Ferritin: 239 ng/mL (ref 11–307)

## 2019-07-03 LAB — IRON AND TIBC
Iron: 74 ug/dL (ref 41–142)
Saturation Ratios: 25 % (ref 21–57)
TIBC: 295 ug/dL (ref 236–444)
UIBC: 222 ug/dL (ref 120–384)

## 2019-07-03 LAB — TSH: TSH: 1.46 u[IU]/mL (ref 0.450–4.500)

## 2019-07-03 LAB — VITAMIN B12: Vitamin B-12: 218 pg/mL (ref 180–914)

## 2019-07-03 NOTE — Telephone Encounter (Signed)
Medication refill request: levothyroxine  Last AEX:  04/22/19 Next AEX: 04/22/20 Last MMG (if hormonal medication request): 10/19/17  Bi-rads 1 neg  Refill authorized: #30 with 1 RF

## 2019-07-04 ENCOUNTER — Telehealth: Payer: Self-pay | Admitting: *Deleted

## 2019-07-04 NOTE — Telephone Encounter (Signed)
Message received from patient requesting lab results from 07/03/19.  Call placed back to patient. Pt informed that results were reviewed by Dr. Marin Olp and labs are WNL at this time. Patient appreciative of call back and has no further questions or concerns at this time.

## 2019-07-11 ENCOUNTER — Inpatient Hospital Stay: Payer: 59

## 2019-07-11 ENCOUNTER — Other Ambulatory Visit: Payer: Self-pay

## 2019-07-11 ENCOUNTER — Inpatient Hospital Stay: Payer: 59 | Attending: Hematology & Oncology | Admitting: Family

## 2019-07-11 ENCOUNTER — Telehealth: Payer: Self-pay | Admitting: Family

## 2019-07-11 VITALS — BP 115/63 | HR 107 | Resp 18 | Ht 63.5 in | Wt 197.0 lb

## 2019-07-11 DIAGNOSIS — E538 Deficiency of other specified B group vitamins: Secondary | ICD-10-CM | POA: Diagnosis not present

## 2019-07-11 DIAGNOSIS — D500 Iron deficiency anemia secondary to blood loss (chronic): Secondary | ICD-10-CM | POA: Insufficient documentation

## 2019-07-11 DIAGNOSIS — E039 Hypothyroidism, unspecified: Secondary | ICD-10-CM | POA: Insufficient documentation

## 2019-07-11 DIAGNOSIS — N920 Excessive and frequent menstruation with regular cycle: Secondary | ICD-10-CM | POA: Insufficient documentation

## 2019-07-11 DIAGNOSIS — K9089 Other intestinal malabsorption: Secondary | ICD-10-CM | POA: Diagnosis not present

## 2019-07-11 DIAGNOSIS — D510 Vitamin B12 deficiency anemia due to intrinsic factor deficiency: Secondary | ICD-10-CM | POA: Diagnosis present

## 2019-07-11 DIAGNOSIS — K912 Postsurgical malabsorption, not elsewhere classified: Secondary | ICD-10-CM

## 2019-07-11 DIAGNOSIS — Z9884 Bariatric surgery status: Secondary | ICD-10-CM | POA: Diagnosis not present

## 2019-07-11 DIAGNOSIS — Z903 Acquired absence of stomach [part of]: Secondary | ICD-10-CM

## 2019-07-11 DIAGNOSIS — D508 Other iron deficiency anemias: Secondary | ICD-10-CM | POA: Diagnosis not present

## 2019-07-11 MED ORDER — CYANOCOBALAMIN 1000 MCG/ML IJ SOLN
1000.0000 ug | Freq: Once | INTRAMUSCULAR | Status: AC
  Administered 2019-07-11: 1000 ug via INTRAMUSCULAR

## 2019-07-11 MED ORDER — CYANOCOBALAMIN 1000 MCG/ML IJ SOLN
INTRAMUSCULAR | Status: AC
  Filled 2019-07-11: qty 1

## 2019-07-11 NOTE — Progress Notes (Signed)
Hematology and Oncology Follow Up Visit  Megan Davila [id number] Apr 08, 1966 53 y.o. 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  B12 level last week was 218, iron saturation 25% and ferritin 239.  She has had fatigue and is quite concerned with her weight gain. Her weight is up 13 lbs since her last visit in June.  She was recently diagnosed with hypothyroidism. TSH 2 weeks ago was 1.460. She is currently on Synthroid 50 mcg Po daily. She plans to follow-up with her PCP (she states that they are already aware) if she continues to gain weight.  No episodes of bleeding, no bruising or petechiae.  She has not had a cycle since April.  No fever, chills, cough, rash, dizziness, SOB, chest pain, palpitations, abdominal pain or changes in bowel or bladder habits.  She has migraines which she states seem to be well controlled right now. She will occasionally have n/v with them.  She has dry mouth which she states is due to some of her medications.  No swelling, tenderness, numbness or tingling in her extremities at this time.  She is trying to walk regularly for exercise.  She has a good appetite and is staying well hydrated.   ECOG Performance Status: 1 - Symptomatic but completely ambulatory  Medications:  Allergies as of 07/11/2019      Reactions   Naproxen Anaphylaxis   Monistat [miconazole] Other (See Comments)   "burning"   Sulfa Antibiotics Hives   Tioconazole Itching      Medication List       Accurate as of July 11, 2019  3:24 PM. If you have any questions, ask your nurse or doctor.        acetaminophen 500 MG tablet Commonly known as: TYLENOL Take by  mouth every 6 (six) hours as needed.   Aimovig 140 MG/ML Soaj Generic drug: Erenumab-aooe every 30 (thirty) days.   ALPRAZolam 1 MG tablet Commonly known as: XANAX Take 1 mg by mouth 2 (two) times daily as needed. Anxiety   Armodafinil 250 MG tablet Take 250 mg by mouth every morning.   baclofen 10 MG tablet Commonly known as: LIORESAL Take by mouth.   botulinum toxin Type A 100 units Solr injection Commonly known as: BOTOX once. Unaware of dose, pt gets q 31mo for migraines   Dexilant 60 MG capsule Generic drug: dexlansoprazole Take 60 mg by mouth daily.   dicyclomine 10 MG capsule Commonly known as: BENTYL Take 10 mg by mouth 3 (three) times daily.   diphenhydrAMINE 12.5 MG/5ML elixir Commonly known as: BENADRYL Take 12.5 mg by mouth every 6 (six) hours as needed.   divalproex 500 MG DR tablet Commonly known as: DEPAKOTE Take 1 tablet (500 mg total) by mouth 2 (two) times daily.   doxepin 25 MG capsule Commonly known as: SINEQUAN TK 1 TO 3 CS PO 45 MINUTES BEFORE SLEEP   eletriptan 40 MG tablet Commonly known as: RELPAX Take 1 tablet (40 mg total) by mouth as needed for migraine or headache. May repeat in 2 hours if needed. Max 2 tabs per day or 8 tabs per month.   ergocalciferol 1.25 MG (50000 UT) capsule Commonly  known as: VITAMIN D2 Take 50,000 Units by mouth. She is taking daily M-F - pr pt   Eszopiclone 3 MG Tabs TK 1 T PO QD HS   fexofenadine 180 MG tablet Commonly known as: ALLEGRA Take 180 mg by mouth daily as needed. allergies   fluticasone 50 MCG/ACT nasal spray Commonly known as: FLONASE Place 1 spray into both nostrils as needed.   gabapentin 300 MG capsule Commonly known as: NEURONTIN Take one pill at night for 1 week.  Increase to 2 pills at night for 1 week.  Increase to 3 pills at night until seen   hydrOXYzine 25 MG tablet Commonly known as: ATARAX/VISTARIL TAKE 1 TABLET BY MOUTH AS NEEDED FOR HEADACHE UPTO EVERY 8 HOURS    imipramine 50 MG tablet Commonly known as: TOFRANIL Take 2 tablets by mouth daily.   lansoprazole 30 MG capsule Commonly known as: PREVACID Take 30 mg by mouth daily as needed. Acid reflux   levothyroxine 50 MCG tablet Commonly known as: SYNTHROID Take 1 tablet (50 mcg) daily in the am at least 30 minutes prior to eating.   Linzess 145 MCG Caps capsule Generic drug: linaclotide Take 145 mcg by mouth daily.   multivitamin with minerals tablet Take 1 tablet by mouth daily.   nystatin powder Commonly known as: nystatin Apply topically 2 (two) times daily. What changed:   when to take this  reasons to take this   potassium chloride SA 20 MEQ tablet Commonly known as: K-DUR Take 1 tablet (20 mEq total) by mouth daily for 10 days.   promethazine 12.5 MG tablet Commonly known as: PHENERGAN TAKE 1 TABLET(12.5 MG) BY MOUTH EVERY 8 HOURS AS NEEDED FOR NAUSEA OR VOMITING   ranitidine 150 MG capsule Commonly known as: ZANTAC   thiamine 50 MG tablet Commonly known as: VITAMIN B-1 daily.   tiZANidine 4 MG capsule Commonly known as: ZANAFLEX Take 2-4 mg by mouth at bedtime.   Ubrelvy 50 MG Tabs Generic drug: Ubrogepant daily.   venlafaxine XR 150 MG 24 hr capsule Commonly known as: EFFEXOR-XR Take 1 capsule by mouth daily.   Wellbutrin XL 300 MG 24 hr tablet Generic drug: buPROPion TK 1 T PO  D QAM   zonisamide 100 MG capsule Commonly known as: ZONEGRAN Take 1 capsule (100 mg total) by mouth 2 (two) times daily.       Allergies:  Allergies  Allergen Reactions  . Naproxen Anaphylaxis  . Monistat [Miconazole] Other (See Comments)    "burning"   . Sulfa Antibiotics Hives  . Tioconazole Itching    Past Medical History, Surgical history, Social history, and Family History were reviewed and updated.  Review of Systems: All other 10 point review of systems is negative.   Physical Exam:  vitals were not taken for this visit.   Wt Readings from Last 3  Encounters:  05/29/19 184 lb (83.5 kg)  04/22/19 190 lb (86.2 kg)  02/05/19 175 lb 12.8 oz (79.7 kg)    Ocular: Sclerae unicteric, pupils equal, round and reactive to light Ear-nose-throat: Oropharynx clear, dentition fair Lymphatic: No cervical or supraclavicular adenopathy Lungs no rales or rhonchi, good excursion bilaterally Heart regular rate and rhythm, no murmur appreciated Abd soft, nontender, positive bowel sounds, no liver or spleen tip palpated on exam, no fluid wave  MSK no focal spinal tenderness, no joint edema Neuro: non-focal, well-oriented, appropriate affect Breasts: Deferred   Lab Results  Component Value Date   WBC 7.3 07/03/2019   HGB  14.4 07/03/2019   HCT 44.6 07/03/2019   MCV 98.5 07/03/2019   PLT 275 07/03/2019   Lab Results  Component Value Date   FERRITIN 239 07/03/2019   IRON 74 07/03/2019   TIBC 295 07/03/2019   UIBC 222 07/03/2019   IRONPCTSAT 25 07/03/2019   Lab Results  Component Value Date   RETICCTPCT 1.1 07/03/2019   RBC 4.52 07/03/2019   RBC 4.53 07/03/2019   RETICCTABS 49.62 09/07/2016   No results found for: KPAFRELGTCHN, LAMBDASER, KAPLAMBRATIO No results found for: IGGSERUM, IGA, IGMSERUM No results found for: Odetta Pink, SPEI   Chemistry      Component Value Date/Time   NA 137 07/03/2019 1113   NA 137 10/18/2017 1630   NA 140 09/07/2016 1327   K 3.9 07/03/2019 1113   K 3.4 (L) 09/07/2016 1327   CL 101 07/03/2019 1113   CO2 27 07/03/2019 1113   CO2 22 09/07/2016 1327   BUN 14 07/03/2019 1113   BUN 18 10/18/2017 1630   BUN 13.3 09/07/2016 1327   CREATININE 0.97 07/03/2019 1113   CREATININE 0.70 12/29/2016 1652   CREATININE 0.8 09/07/2016 1327      Component Value Date/Time   CALCIUM 8.8 (L) 07/03/2019 1113   CALCIUM 8.2 (L) 09/07/2016 1327   ALKPHOS 71 07/03/2019 1113   ALKPHOS 54 09/07/2016 1327   AST 14 (L) 07/03/2019 1113   AST 11 09/07/2016 1327   ALT 10  07/03/2019 1113   ALT <9 09/07/2016 1327   BILITOT 0.3 07/03/2019 1113   BILITOT 0.41 09/07/2016 1327       Impression and Plan: Ms. Kotowski is a very pleasant 53 yo caucasian female with both B 12 and iron deficiency 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  We will get her on a monthly B12 injection schedule with follow-up in 2 months.  No IV iron needed at this time.  She will contact our office with any questions or concerns. We can certainly see her sooner if needed.   Laverna Peace, NP 8/6/20203:24 PM

## 2019-07-11 NOTE — Telephone Encounter (Signed)
Appointments scheduled calendar printed per 8/6 los 

## 2019-07-11 NOTE — Patient Instructions (Signed)

## 2019-08-13 ENCOUNTER — Ambulatory Visit: Payer: 59

## 2019-08-14 ENCOUNTER — Other Ambulatory Visit: Payer: Self-pay | Admitting: Obstetrics and Gynecology

## 2019-08-14 NOTE — Telephone Encounter (Signed)
Medication refill request: Synthroid Last AEX:  04/22/2019 BS Next AEX: 04/22/2020 Last MMG (if hormonal medication request): 10/19/2017 BIRADS 1 Negative Density B Refill authorized: Pending authorization. #[id number] with 9 refills per pharmacy request. Please advise.

## 2019-09-02 ENCOUNTER — Telehealth: Payer: Self-pay | Admitting: Family

## 2019-09-02 ENCOUNTER — Telehealth: Payer: Self-pay | Admitting: Hematology & Oncology

## 2019-09-02 NOTE — Telephone Encounter (Signed)
Returned patient's phone call regarding rescheduling an appointment, per patient's request appointment has been moved to 10/06.

## 2019-09-02 NOTE — Telephone Encounter (Signed)
Received call from patient to include iron appt on 10/8 visit and to move lab appt a few days earlier at [facility]. Gave patient [facility] number to resch lab appt and gave her new appt time for 10/8

## 2019-09-10 ENCOUNTER — Telehealth: Payer: Self-pay | Admitting: Obstetrics and Gynecology

## 2019-09-10 ENCOUNTER — Inpatient Hospital Stay: Payer: 59 | Attending: Hematology & Oncology

## 2019-09-10 ENCOUNTER — Encounter: Payer: Self-pay | Admitting: Obstetrics and Gynecology

## 2019-09-10 ENCOUNTER — Other Ambulatory Visit: Payer: Self-pay | Admitting: Obstetrics and Gynecology

## 2019-09-10 ENCOUNTER — Other Ambulatory Visit: Payer: Self-pay

## 2019-09-10 ENCOUNTER — Telehealth: Payer: Self-pay | Admitting: *Deleted

## 2019-09-10 DIAGNOSIS — E039 Hypothyroidism, unspecified: Secondary | ICD-10-CM

## 2019-09-10 DIAGNOSIS — K912 Postsurgical malabsorption, not elsewhere classified: Secondary | ICD-10-CM

## 2019-09-10 DIAGNOSIS — D510 Vitamin B12 deficiency anemia due to intrinsic factor deficiency: Secondary | ICD-10-CM | POA: Insufficient documentation

## 2019-09-10 DIAGNOSIS — N920 Excessive and frequent menstruation with regular cycle: Secondary | ICD-10-CM | POA: Diagnosis not present

## 2019-09-10 DIAGNOSIS — D508 Other iron deficiency anemias: Secondary | ICD-10-CM

## 2019-09-10 DIAGNOSIS — D500 Iron deficiency anemia secondary to blood loss (chronic): Secondary | ICD-10-CM | POA: Insufficient documentation

## 2019-09-10 DIAGNOSIS — E538 Deficiency of other specified B group vitamins: Secondary | ICD-10-CM

## 2019-09-10 LAB — CMP (CANCER CENTER ONLY)
ALT: 11 U/L (ref 0–44)
AST: 15 U/L (ref 15–41)
Albumin: 3.6 g/dL (ref 3.5–5.0)
Alkaline Phosphatase: 72 U/L (ref 38–126)
Anion gap: 11 (ref 5–15)
BUN: 10 mg/dL (ref 6–20)
CO2: 26 mmol/L (ref 22–32)
Calcium: 8.4 mg/dL — ABNORMAL LOW (ref 8.9–10.3)
Chloride: 103 mmol/L (ref 98–111)
Creatinine: 0.85 mg/dL (ref 0.44–1.00)
GFR, Est AFR Am: >60 mL/min (ref >60)
GFR, Estimated: >60 mL/min (ref >60)
Glucose, Bld: 68 mg/dL — ABNORMAL LOW (ref 70–99)
Potassium: 3.3 mmol/L — ABNORMAL LOW (ref 3.5–5.1)
Sodium: 140 mmol/L (ref 135–145)
Total Bilirubin: 0.3 mg/dL (ref 0.3–1.2)
Total Protein: 6.6 g/dL (ref 6.5–8.1)

## 2019-09-10 LAB — CBC WITH DIFFERENTIAL (CANCER CENTER ONLY)
Abs Immature Granulocytes: 0.01 10*3/uL (ref 0.00–0.07)
Basophils Absolute: 0.1 10*3/uL (ref 0.0–0.1)
Basophils Relative: 1 %
Eosinophils Absolute: 0.2 10*3/uL (ref 0.0–0.5)
Eosinophils Relative: 3 %
HCT: 43.7 % (ref 36.0–46.0)
Hemoglobin: 14.4 g/dL (ref 12.0–15.0)
Immature Granulocytes: 0 %
Lymphocytes Relative: 27 %
Lymphs Abs: 2.3 10*3/uL (ref 0.7–4.0)
MCH: 31.7 pg (ref 26.0–34.0)
MCHC: 33 g/dL (ref 30.0–36.0)
MCV: 96.3 fL (ref 80.0–100.0)
Monocytes Absolute: 0.6 10*3/uL (ref 0.1–1.0)
Monocytes Relative: 7 %
Neutro Abs: 5.1 10*3/uL (ref 1.7–7.7)
Neutrophils Relative %: 62 %
Platelet Count: 313 10*3/uL (ref 150–400)
RBC: 4.54 MIL/uL (ref 3.87–5.11)
RDW: 11.6 % (ref 11.5–15.5)
WBC Count: 8.3 10*3/uL (ref 4.0–10.5)
nRBC: 0 % (ref 0.0–0.2)

## 2019-09-10 LAB — IRON AND TIBC
Iron: 69 ug/dL (ref 41–142)
Saturation Ratios: 26 % (ref 21–57)
TIBC: 266 ug/dL (ref 236–444)
UIBC: 197 ug/dL (ref 120–384)

## 2019-09-10 LAB — VITAMIN B12: Vitamin B-12: 168 pg/mL — ABNORMAL LOW (ref 180–914)

## 2019-09-10 LAB — FERRITIN: Ferritin: 208 ng/mL (ref 11–307)

## 2019-09-10 NOTE — Telephone Encounter (Signed)
If possible, please add TSH, free T3 and free T4 to labs already drawn at [facility]. I am not able to order the vitamin A level.

## 2019-09-10 NOTE — Telephone Encounter (Signed)
Patient sent the following message through [place]. Routing to triage to assist patient with request.  [place], [facility]  Phone Number: [phone]        [place],  I just had some lab work done at [facility]. Another doctor had suggested that I have my T3 & T4 checked since that has not been done. I cannot tell any difference since I have been on the Thyroid medication. The lab at [facility] took additional blood & they would test it for T3 & T4 but they would need you to fax over a request to have those levels checked. They are already checking my Thyroid level and will post in MyChart so you can view it along with all the other bloodwork they checked. Will you please fax a request to [phone]? I will confirm our next appointment and see you soon. Please let me know if you will not order those tests.  Thank you,  Jaleia Berenguer  Cell [phone]

## 2019-09-10 NOTE — Telephone Encounter (Signed)
Call received from patient requesting an order for Vitamin A and T3 and T4 from S. [place] NP.  Pt notified per order of S. [place] NP to contact her PCP for those orders.

## 2019-09-10 NOTE — Telephone Encounter (Signed)
Call to patient. Patient states she had blood work done today at the [facility] [place]. Patient states she was suppose to have labs done at [facility] a few weeks ago, but "I'm a hard stick and they kept blowing my veins." Patient was told by MD at [facility] that since she is a gastric bypass patient, was told she would benefit more from being on a higher dose of name brand synthroid. Was told she would need to contact the doctor prescribing thyroid medication to order T3 and T4 labs. Patient asking if Dr. Quincy Simmonds can order those for her? States the lady who drew her blood today collected extra blood samples for additional testing if needed. Also asking if Dr. Quincy Simmonds can order vitamin A level? RN advised would review with Dr. Quincy Simmonds and return call with recommendations. Patient agreeable.   Routing to provider for review.

## 2019-09-11 NOTE — Telephone Encounter (Signed)
Call to patient. Message given to patient as seen below from Dr. Quincy Simmonds and patient verbalized understanding. States she has already spoken with another MD about vitamin A level- waiting for response. RN advised Luci, Phlebotomist, checking with [facility] to add on labs. Patient agreeable.

## 2019-09-11 NOTE — Telephone Encounter (Signed)
Call to patient. Patient advised RN had just spoken with Lorre Nick, [employer], and patient had TSH drawn with labs on 10-6. RN advised Lorre Nick was able to add-on free T3 & free T4. Will await results. Patient agreeable and appreciative of phone call.   Routing to provider and will close encounter.

## 2019-09-11 NOTE — Telephone Encounter (Signed)
I do not see a TSH done from her labs at the [facility] yesterday.  She needs to TSH added also.

## 2019-09-12 ENCOUNTER — Encounter: Payer: Self-pay | Admitting: Family

## 2019-09-12 ENCOUNTER — Inpatient Hospital Stay: Payer: 59 | Admitting: Family

## 2019-09-12 ENCOUNTER — Inpatient Hospital Stay: Payer: 59

## 2019-09-12 ENCOUNTER — Telehealth: Payer: Self-pay | Admitting: Hematology & Oncology

## 2019-09-12 ENCOUNTER — Telehealth: Payer: Self-pay | Admitting: Family

## 2019-09-12 ENCOUNTER — Other Ambulatory Visit: Payer: 59

## 2019-09-12 ENCOUNTER — Other Ambulatory Visit: Payer: Self-pay

## 2019-09-12 VITALS — BP 128/87 | HR 80 | Temp 97.7°F | Resp 18 | Ht 63.5 in | Wt 201.8 lb

## 2019-09-12 DIAGNOSIS — K912 Postsurgical malabsorption, not elsewhere classified: Secondary | ICD-10-CM

## 2019-09-12 DIAGNOSIS — D510 Vitamin B12 deficiency anemia due to intrinsic factor deficiency: Secondary | ICD-10-CM

## 2019-09-12 DIAGNOSIS — D508 Other iron deficiency anemias: Secondary | ICD-10-CM

## 2019-09-12 DIAGNOSIS — N920 Excessive and frequent menstruation with regular cycle: Secondary | ICD-10-CM | POA: Diagnosis not present

## 2019-09-12 MED ORDER — CYANOCOBALAMIN 1000 MCG/ML IJ SOLN
1000.0000 ug | Freq: Once | INTRAMUSCULAR | Status: AC
  Administered 2019-09-12: 1000 ug via INTRAMUSCULAR

## 2019-09-12 MED ORDER — CYANOCOBALAMIN 1000 MCG/ML IJ SOLN
INTRAMUSCULAR | Status: AC
  Filled 2019-09-12: qty 1

## 2019-09-12 NOTE — Telephone Encounter (Signed)
Labs reviewed with [employer], Lorre Nick.  09/10/19 add on labs, TSH, T3, T4 results printed and to Dr. Quincy Simmonds to review.

## 2019-09-12 NOTE — Telephone Encounter (Signed)
Message to [facility] requesting confirmation of labs added to 09/10/19 labs.

## 2019-09-12 NOTE — Progress Notes (Signed)
Hematology and Oncology Follow Up Visit  DANIAL FLIPPIN [id number] 1966/04/27 53 y.o. 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 Cannizzo is here today for follow-up. Her iron studies looked good. B 12 is low at 168.  She is feeling fatigued.  No bleeding, bruising or petechiae.  She is still concerned with weight gain and has followed up with her gynecologist as well as her bariatric surgery team. They have made some recommendations that will hopefully help her.  No fever, chills, n/v, cough, rash, dizziness, SOB, chest pain, palpitations, abdominal pain or changes in bowel or bladder habits.  No swelling, tenderness, numbness or tingling in her extremities.  She is eating well and staying hydrated. Her weight is stable.   ECOG Performance Status: 1 - Symptomatic but completely ambulatory  Medications:  Allergies as of 09/12/2019      Reactions   Naproxen Anaphylaxis   Monistat [miconazole] Other (See Comments)   "burning"   Sulfa Antibiotics Hives   Tioconazole Itching      Medication List       Accurate as of September 12, 2019  1:24 PM. If you have any questions, ask your nurse or doctor.        acetaminophen 500 MG tablet Commonly known as: TYLENOL Take by mouth every 6 (six) hours as needed.   Aimovig 140 MG/ML Soaj Generic drug: Erenumab-aooe every 30 (thirty) days.   ALPRAZolam 1 MG tablet Commonly known as: XANAX Take 1 mg by mouth 2 (two) times daily as needed. Anxiety   Armodafinil 250 MG tablet Take 250 mg by mouth every morning.   baclofen 10 MG tablet Commonly known as: LIORESAL Take by mouth.   botulinum toxin Type A 100 units Solr injection Commonly known as: BOTOX once. Unaware of dose, pt gets q 17mo for migraines    Dexilant 60 MG capsule Generic drug: dexlansoprazole Take 60 mg by mouth daily.   dicyclomine 10 MG capsule Commonly known as: BENTYL Take 10 mg by mouth 3 (three) times daily.   diphenhydrAMINE 12.5 MG/5ML elixir Commonly known as: BENADRYL Take 12.5 mg by mouth every 6 (six) hours as needed.   divalproex 500 MG DR tablet Commonly known as: DEPAKOTE Take 1 tablet (500 mg total) by mouth 2 (two) times daily.   doxepin 25 MG capsule Commonly known as: SINEQUAN TK 1 TO 3 CS PO 45 MINUTES BEFORE SLEEP   doxepin 75 MG capsule Commonly known as: SINEQUAN   eletriptan 40 MG tablet Commonly known as: RELPAX Take 1 tablet (40 mg total) by mouth as needed for migraine or headache. May repeat in 2 hours if needed. Max 2 tabs per day or 8 tabs per month.   ergocalciferol 1.25 MG (50000 UT) capsule Commonly known as: VITAMIN D2 Take 50,000 Units by mouth. She is taking daily M-F - pr pt   Eszopiclone 3 MG Tabs TK 1 T PO QD HS   fexofenadine 180 MG tablet Commonly known as: ALLEGRA Take 180 mg by mouth daily as needed. allergies   fluticasone 50 MCG/ACT nasal spray Commonly known as: FLONASE Place 1 spray into both nostrils as needed.   gabapentin 300 MG capsule Commonly known as: NEURONTIN Take one pill at night for 1 week.  Increase to 2 pills at  night for 1 week.  Increase to 3 pills at night until seen   hydrOXYzine 25 MG tablet Commonly known as: ATARAX/VISTARIL TAKE 1 TABLET BY MOUTH AS NEEDED FOR HEADACHE UPTO EVERY 8 HOURS   imipramine 50 MG tablet Commonly known as: TOFRANIL Take 2 tablets by mouth daily.   lansoprazole 30 MG capsule Commonly known as: PREVACID Take 30 mg by mouth daily as needed. Acid reflux   levothyroxine 50 MCG tablet Commonly known as: SYNTHROID TAKE 1 TABLET BY MOUTH EVERY DAY 30 MINUTES PRIOR TO EATING   Linzess 145 MCG Caps capsule Generic drug: linaclotide Take 145 mcg by mouth daily.   multivitamin with minerals tablet Take 1  tablet by mouth daily.   nystatin powder Commonly known as: nystatin Apply topically 2 (two) times daily. What changed:   when to take this  reasons to take this   potassium chloride SA 20 MEQ tablet Commonly known as: KLOR-CON Take 1 tablet (20 mEq total) by mouth daily for 10 days.   promethazine 12.5 MG tablet Commonly known as: PHENERGAN TAKE 1 TABLET(12.5 MG) BY MOUTH EVERY 8 HOURS AS NEEDED FOR NAUSEA OR VOMITING   ranitidine 150 MG capsule Commonly known as: ZANTAC   thiamine 50 MG tablet Commonly known as: VITAMIN B-1 daily.   tiZANidine 4 MG capsule Commonly known as: ZANAFLEX Take 2-4 mg by mouth at bedtime.   Ubrelvy 50 MG Tabs Generic drug: Ubrogepant daily.   venlafaxine XR 150 MG 24 hr capsule Commonly known as: EFFEXOR-XR Take 1 capsule by mouth daily.   Wellbutrin XL 300 MG 24 hr tablet Generic drug: buPROPion TK 1 T PO  D QAM   Wellbutrin XL 150 MG 24 hr tablet Generic drug: buPROPion   zonisamide 100 MG capsule Commonly known as: ZONEGRAN Take 1 capsule (100 mg total) by mouth 2 (two) times daily.       Allergies:  Allergies  Allergen Reactions  . Naproxen Anaphylaxis  . Monistat [Miconazole] Other (See Comments)    "burning"   . Sulfa Antibiotics Hives  . Tioconazole Itching    Past Medical History, Surgical history, Social history, and Family History were reviewed and updated.  Review of Systems: All other 10 point review of systems is negative.   Physical Exam:  vitals were not taken for this visit.   Wt Readings from Last 3 Encounters:  07/11/19 197 lb 0.6 oz (89.4 kg)  05/29/19 184 lb (83.5 kg)  04/22/19 190 lb (86.2 kg)    Ocular: Sclerae unicteric, pupils equal, round and reactive to light Ear-nose-throat: Oropharynx clear, dentition fair Lymphatic: No cervical or supraclavicular adenopathy Lungs no rales or rhonchi, good excursion bilaterally Heart regular rate and rhythm, no murmur appreciated Abd soft,  nontender, positive bowel sounds, no liver or spleen tip palpated on exam, no fluid wave  MSK no focal spinal tenderness, no joint edema Neuro: non-focal, well-oriented, appropriate affect Breasts: Deferred   Lab Results  Component Value Date   WBC 8.3 09/10/2019   HGB 14.4 09/10/2019   HCT 43.7 09/10/2019   MCV 96.3 09/10/2019   PLT 313 09/10/2019   Lab Results  Component Value Date   FERRITIN 208 09/10/2019   IRON 69 09/10/2019   TIBC 266 09/10/2019   UIBC 197 09/10/2019   IRONPCTSAT 26 09/10/2019   Lab Results  Component Value Date   RETICCTPCT 1.1 07/03/2019   RBC 4.54 09/10/2019   RETICCTABS 49.62 09/07/2016   No results found for: KPAFRELGTCHN, LAMBDASER, KAPLAMBRATIO No  results found for: IGGSERUM, IGA, IGMSERUM No results found for: Odetta Pink, SPEI   Chemistry      Component Value Date/Time   NA 140 09/10/2019 1018   NA 137 10/18/2017 1630   NA 140 09/07/2016 1327   K 3.3 (L) 09/10/2019 1018   K 3.4 (L) 09/07/2016 1327   CL 103 09/10/2019 1018   CO2 26 09/10/2019 1018   CO2 22 09/07/2016 1327   BUN 10 09/10/2019 1018   BUN 18 10/18/2017 1630   BUN 13.3 09/07/2016 1327   CREATININE 0.85 09/10/2019 1018   CREATININE 0.70 12/29/2016 1652   CREATININE 0.8 09/07/2016 1327      Component Value Date/Time   CALCIUM 8.4 (L) 09/10/2019 1018   CALCIUM 8.2 (L) 09/07/2016 1327   ALKPHOS 72 09/10/2019 1018   ALKPHOS 54 09/07/2016 1327   AST 15 09/10/2019 1018   AST 11 09/07/2016 1327   ALT 11 09/10/2019 1018   ALT <9 09/07/2016 1327   BILITOT 0.3 09/10/2019 1018   BILITOT 0.41 09/07/2016 1327       Impression and Plan: Ms. Feland is a very pleasant 53 yo caucasian female with both B 12 and iron deficiency 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  We will increase her B 12 injections to every 3 weeks with follow-up in 12 weeks.  She would like them to be at [facility] when she does not have a follow-up  since it is closer to home.  She will contact our office with any questions or concerns. We can certainly see her sooner if needed.   Laverna Peace, NP 10/8/20201:24 PM

## 2019-09-12 NOTE — Patient Instructions (Signed)

## 2019-09-12 NOTE — Telephone Encounter (Signed)
Called and was unable to [facility] due to VM bing full/  Letter/Calendar will be mailed per 10/8 los  IB message sent to [facility] in order to schedule injection appointments in [place] per 10/8 los

## 2019-09-12 NOTE — Telephone Encounter (Signed)
Appointments scheduled and IB messages has been sent to [place] [email or web address] to schedule injections only in [place] per 10/8 los.

## 2019-09-19 LAB — SPECIMEN STATUS REPORT

## 2019-09-19 LAB — T3, FREE: T3, Free: 2.8 pg/mL (ref 2.0–4.4)

## 2019-09-19 LAB — T4, FREE: Free T4: 1.18 ng/dL (ref 0.82–1.77)

## 2019-10-03 ENCOUNTER — Inpatient Hospital Stay: Payer: 59

## 2019-10-03 ENCOUNTER — Telehealth: Payer: Self-pay | Admitting: Hematology & Oncology

## 2019-10-03 NOTE — Telephone Encounter (Signed)
Returned patient's phone call regarding rescheduling an appointment, left a voicemail. 

## 2019-10-14 ENCOUNTER — Ambulatory Visit: Payer: Self-pay | Admitting: Obstetrics and Gynecology

## 2019-10-18 ENCOUNTER — Other Ambulatory Visit: Payer: Self-pay

## 2019-10-21 NOTE — Progress Notes (Signed)
GYNECOLOGY  VISIT   HPI: 53 y.o.   Divorced  Caucasian  female   G1P1 with Patient's last menstrual period was 03/20/2019 (approximate).   here for 6 month pap and HR HPV testing.    States she is gaining weight and that she was told that she needs more name brand Synthroid.   Dr. Chalmers Cater is managing her bone health.   Patient is now on metformin to help with weight loss.   Walking in the neighborhood.   She is having stress incontinence and some urgency.  Urine Dip: Trace RBCs  GYNECOLOGIC HISTORY: Patient's last menstrual period was 03/20/2019 (approximate). Contraception:  Tubal Menopausal hormone therapy:  n/a Last mammogram: 10/19/17 BIRADS 1 negative/density b--she knows needs to schedule Last pap smear: 04-22-19 Neg:Neg HR HPV, 09-25-18 Neg:Neg HR HPV, 04-17-18 LGSIL:Pos HR HPV        OB History    Gravida  1   Para  1   Term      Preterm      AB      Living  1     SAB      TAB      Ectopic      Multiple  1   Live Births                 Patient Active Problem List   Diagnosis Date Noted  . Vitamin D deficiency 05/23/2017  . Anxiety 06/19/2015  . Anemia, iron deficiency 06/19/2015  . Pernicious anemia 06/19/2015  . 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 Protein-calorie malnutrition, severe ([place]) 04/18/2015  . Severe protein-calorie malnutrition ([place]) 04/18/2015  . 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 Bariatric surgery status 04/18/2015  . 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 GERD (gastroesophageal reflux disease) 04/17/2015  . Hypokalemia 04/16/2015  . Migraine with aura 05/21/2014  . 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 Abnormal Pap smear   . Anemia   . Anxiety   . Broken toe 12-22-15   middle toe right foot  . Cataract of both eyes   . Depression   . GERD (gastroesophageal reflux disease)   . Headache(784.0)    . Heart murmur   . History of kidney stones   . 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 Migraine   . Perforated bowel ([place])   . Pernicious anemia 06/19/2015  . PONV (postoperative nausea and vomiting)   . Thyroid disease    hypothyrodism    Past Surgical History:  Procedure Laterality Date  . CATARACT EXTRACTION, BILATERAL Bilateral 02/2016  . CERVICAL BIOPSY  W/ LOOP ELECTRODE EXCISION  11/2013   LGSIL  . 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 CESAREAN SECTION  2008  . CHOLECYSTECTOMY    . GASTRIC BYPASS  2002  . HYSTEROSCOPY W/D&C  10/14/2011   Procedure: DILATATION AND CURETTAGE (D&C) /HYSTEROSCOPY;  Surgeon: Arloa Koh;  Location: [facility];  Service: Gynecology;  Laterality: Bilateral;  . LASIK    . NOSE SURGERY    . STOMACH SURGERY  04/20/2015   dilation of "connection between stomach and intestine", [facility]  . TONSILLECTOMY    . TONSILLECTOMY    . TUBAL LIGATION  2008    Current Outpatient Medications  Medication Sig Dispense Refill  . Semaglutide,0.25 or 0.5MG /DOS, 2 MG/1.5ML SOPN Inject into the skin.    Marland Kitchen AIMOVIG 140 MG/ML SOAJ every  30 (thirty) days.    . ALPRAZolam (XANAX) 1 MG tablet Take 1 mg by mouth 2 (two) times daily as needed. Anxiety      . Armodafinil 250 MG tablet Take 250 mg by mouth every morning.  2  . baclofen (LIORESAL) 10 MG tablet Take by mouth.    . botulinum toxin Type A (BOTOX) 100 units SOLR injection once. Unaware of dose, pt gets q 55mo for migraines    . CONTRAVE 8-90 MG TB12 Take 2 tablets by mouth 2 (two) times daily.    Marland Kitchen DEXILANT 60 MG capsule Take 60 mg by mouth daily.    Marland Kitchen dicyclomine (BENTYL) 10 MG capsule Take 10 mg by mouth 3 (three) times daily.   0  . diphenhydrAMINE (BENADRYL) 12.5 MG/5ML elixir Take 12.5 mg by mouth every 6 (six) hours as needed.     . divalproex (DEPAKOTE) 500 MG DR tablet Take 1 tablet (500 mg total) by mouth 2 (two) times daily. 180 tablet 4  .  doxepin (SINEQUAN) 75 MG capsule 75 mg at bedtime.     Marland Kitchen eletriptan (RELPAX) 40 MG tablet Take 1 tablet (40 mg total) by mouth as needed for migraine or headache. May repeat in 2 hours if needed. Max 2 tabs per day or 8 tabs per month. 10 tablet 12  . ergocalciferol (VITAMIN D2) 50000 UNITS capsule Take 50,000 Units by mouth. She is taking daily M-F - pr pt    . Eszopiclone 3 MG TABS TK 1 T PO QD HS  1  . fexofenadine (ALLEGRA) 180 MG tablet Take 180 mg by mouth daily as needed. allergies     . fluticasone (FLONASE) 50 MCG/ACT nasal spray Place 1 spray into both nostrils as needed.  1  . gabapentin (NEURONTIN) 300 MG capsule Take one pill at night for 1 week.  Increase to 2 pills at night for 1 week.  Increase to 3 pills at night until seen    . hydrOXYzine (ATARAX/VISTARIL) 25 MG tablet TAKE 1 TABLET BY MOUTH AS NEEDED FOR HEADACHE UPTO EVERY 8 HOURS  2  . imipramine (TOFRANIL) 50 MG tablet Take 2 tablets by mouth daily.  0  . lansoprazole (PREVACID) 30 MG capsule Take 30 mg by mouth daily as needed. Acid reflux      . levothyroxine (SYNTHROID) 50 MCG tablet TAKE 1 TABLET BY MOUTH EVERY DAY 30 MINUTES PRIOR TO EATING 30 tablet 7  . LINZESS 145 MCG CAPS capsule Take 145 mcg by mouth daily.  3  . Multiple Vitamins-Minerals (MULTIVITAMIN WITH MINERALS) tablet Take 1 tablet by mouth daily.      Marland Kitchen nystatin (NYSTATIN) powder Apply topically 2 (two) times daily. (Patient taking differently: Apply topically 2 (two) times daily as needed. ) 45 g 1  . OZEMPIC, 0.25 OR 0.5 MG/DOSE, 2 MG/1.5ML SOPN SMARTSIG:0.25 Milligram(s) SUB-Q Once a Week    . potassium chloride SA (K-DUR) 20 MEQ tablet Take 1 tablet (20 mEq total) by mouth daily for 10 days. 10 tablet 0  . promethazine (PHENERGAN) 12.5 MG tablet TAKE 1 TABLET(12.5 MG) BY MOUTH EVERY 8 HOURS AS NEEDED FOR NAUSEA OR VOMITING 15 tablet 0  . ranitidine (ZANTAC) 150 MG capsule     . thiamine (VITAMIN B-1) 50 MG tablet daily.  0  . tiZANidine (ZANAFLEX) 4 MG  capsule Take 2-4 mg by mouth at bedtime.  0  . UBRELVY 50 MG TABS daily.    Marland Kitchen venlafaxine XR (EFFEXOR-XR) 150 MG 24 hr  capsule Take 1 capsule by mouth daily.  0  . WELLBUTRIN XL 150 MG 24 hr tablet     . WELLBUTRIN XL 300 MG 24 hr tablet TK 1 T PO  D QAM  1  . zonisamide (ZONEGRAN) 100 MG capsule Take 1 capsule (100 mg total) by mouth 2 (two) times daily. 180 capsule 4   No current facility-administered medications for this visit.      ALLERGIES: Naproxen, Monistat [miconazole], Sulfa antibiotics, and Tioconazole  Family History  Problem Relation Age of Onset  . Diabetes Mother   . Heart disease Father   . Social phobia Father   . Psychiatric Illness Father   . Hypertension Father   . Stroke Father   . Dementia Father   . Heart disease Other   . Diabetes Other   . Stroke Other   . Hypertension Maternal Grandmother   . Thyroid disease Maternal Grandmother   . Hypertension Paternal Grandmother   . Stroke Paternal Grandfather   . Breast cancer Neg Hx     Social History   Socioeconomic History  . Marital status: Divorced    Spouse name: Not on file  . Number of children: 1  . Years of education: College  . Highest education level: Not on file  Occupational History    Employer: [employer]  . Financial resource strain: Not on file  . Food insecurity    Worry: Not on file    Inability: Not on file  . Transportation needs    Medical: Not on file    Non-medical: Not on file  Tobacco Use  . Smoking status: Never Smoker  . Smokeless tobacco: Never Used  Substance and Sexual Activity  . Alcohol use: Yes    Alcohol/week: 0.0 standard drinks    Comment: Once a month-rarely  . Drug use: No  . Sexual activity: Yes    Partners: Male    Birth control/protection: Surgical    Comment: tubal  Lifestyle  . Physical activity    Days per week: Not on file    Minutes per session: Not on file  . Stress: Not on file  Relationships  . Social Product manager on phone: Not on file    Gets together: Not on file    Attends religious service: Not on file    Active member of club or organization: Not on file    Attends meetings of clubs or organizations: Not on file    Relationship status: Not on file  . Intimate partner violence    Fear of current or ex partner: Not on file    Emotionally abused: Not on file    Physically abused: Not on file    Forced sexual activity: Not on file  Other Topics Concern  . Not on file  Social History Narrative   Pt lives at home with her family.   Caffeine Use: once daily    Review of Systems  Genitourinary:       Urinary incontinence  All other systems reviewed and are negative.   PHYSICAL EXAMINATION:    BP 122/70 (Cuff Size: Large)   Pulse 70   Temp (!) 97.2 F (36.2 C) (Temporal)   Ht 5\' 3"  (1.6 m)   Wt 200 lb 12.8 oz (91.1 kg)   LMP 03/20/2019 (Approximate)   BMI 35.57 kg/m     General appearance: alert, cooperative and appears stated age  Pelvic: External genitalia:  no lesions  Urethra:  normal appearing urethra with no masses, tenderness or lesions              Bartholins and Skenes: normal                 Vagina: normal appearing vagina with normal color and discharge, no lesions              Cervix: no lesions.  Os somewhat scarred.                 Bimanual Exam:  Uterus:  normal size, contour, position, consistency, mobility, non-tender              Adnexa: no mass, fullness, tenderness               Chaperone was present for exam.  ASSESSMENT  Hx LGSIL and positive HR HPV.  Hx conization and LEEP x 2.  Last LEEP with normal final pathology report.  Status post BTL.  Stress incontinence. Urinary urgency. Weight gain.  Hypothyroidism.  Hx weight loss surgery.  PLAN  Pap and HR HPV.  Thyroid management with Dr. Chalmers Cater.  Patient will ask her if she will manage this for her. We discussed weight loss.  Will refer for pelvic floor therapy for  incontinence. UC sent today. Fu for annual exam and prn.   An After Visit Summary was printed and given to the patient.  __25___ minutes face to face time of which over 50% was spent in counseling.

## 2019-10-22 ENCOUNTER — Other Ambulatory Visit (HOSPITAL_COMMUNITY)
Admission: RE | Admit: 2019-10-22 | Discharge: 2019-10-22 | Disposition: A | Discharge status: Home / Self Care | Payer: 59 | Source: Ambulatory Visit | Attending: Obstetrics and Gynecology | Admitting: Obstetrics and Gynecology

## 2019-10-22 ENCOUNTER — Other Ambulatory Visit: Payer: Self-pay

## 2019-10-22 ENCOUNTER — Encounter: Payer: Self-pay | Admitting: Obstetrics and Gynecology

## 2019-10-22 ENCOUNTER — Ambulatory Visit: Payer: 59 | Admitting: Obstetrics and Gynecology

## 2019-10-22 VITALS — BP 122/70 | HR 70 | Temp 97.2°F | Ht 63.0 in | Wt 200.8 lb

## 2019-10-22 DIAGNOSIS — R8290 Unspecified abnormal findings in urine: Secondary | ICD-10-CM | POA: Diagnosis not present

## 2019-10-22 DIAGNOSIS — Z87410 Personal history of cervical dysplasia: Secondary | ICD-10-CM

## 2019-10-22 DIAGNOSIS — N393 Stress incontinence (female) (male): Secondary | ICD-10-CM

## 2019-10-22 DIAGNOSIS — Z8619 Personal history of other infectious and parasitic diseases: Secondary | ICD-10-CM | POA: Diagnosis not present

## 2019-10-22 LAB — POCT URINALYSIS DIPSTICK
Bilirubin, UA: NEGATIVE
Glucose, UA: NEGATIVE
Ketones, UA: NEGATIVE
Leukocytes, UA: NEGATIVE
Nitrite, UA: NEGATIVE
Protein, UA: NEGATIVE
Urobilinogen, UA: 0.2 E.U./dL
pH, UA: 6 (ref 5.0–8.0)

## 2019-10-24 ENCOUNTER — Other Ambulatory Visit: Payer: Self-pay | Admitting: Obstetrics and Gynecology

## 2019-10-24 ENCOUNTER — Other Ambulatory Visit: Payer: Self-pay

## 2019-10-24 ENCOUNTER — Inpatient Hospital Stay: Payer: 59 | Attending: Hematology & Oncology

## 2019-10-24 VITALS — BP 122/72 | HR 72 | Temp 98.2°F | Resp 18

## 2019-10-24 DIAGNOSIS — K912 Postsurgical malabsorption, not elsewhere classified: Secondary | ICD-10-CM

## 2019-10-24 DIAGNOSIS — Z903 Acquired absence of stomach [part of]: Secondary | ICD-10-CM

## 2019-10-24 DIAGNOSIS — R8290 Unspecified abnormal findings in urine: Secondary | ICD-10-CM

## 2019-10-24 DIAGNOSIS — D510 Vitamin B12 deficiency anemia due to intrinsic factor deficiency: Secondary | ICD-10-CM

## 2019-10-24 LAB — URINE CULTURE

## 2019-10-24 MED ORDER — NITROFURANTOIN MONOHYD MACRO 100 MG PO CAPS: 100.0000 mg | ORAL_CAPSULE | Freq: Two times a day (BID) | ORAL | 0 refills | Status: DC

## 2019-10-24 MED ORDER — CYANOCOBALAMIN 1000 MCG/ML IJ SOLN
1000.0000 ug | Freq: Once | INTRAMUSCULAR | Status: AC
  Administered 2019-10-24: 1000 ug via INTRAMUSCULAR

## 2019-10-24 NOTE — Telephone Encounter (Signed)
Phone message attempted to patient regarding UTI.  Mailbox is full.   I left a My Chart message for her and am sending an Rx to her pharmacy for [place].

## 2019-10-24 NOTE — Patient Instructions (Signed)

## 2019-10-25 ENCOUNTER — Telehealth: Payer: Self-pay | Admitting: *Deleted

## 2019-10-25 MED ORDER — FLUCONAZOLE 150 MG PO TABS: ORAL_TABLET | ORAL | 0 refills | Status: DC

## 2019-10-25 NOTE — Telephone Encounter (Signed)
-----   Message from Nunzio Cobbs, MD sent at 10/24/2019  5:10 PM EST ----- Please contact patient with results of UC showing E Coli.  I recommend Macrobid 100 mg po bid x 5 days.    Spoke with patient, advised as seen above per Dr. Quincy Simmonds. Advised patient I will need to confirm Macrobid instructions with Dr. Quincy Simmonds and return call. Patient agreeable.     Dr. Quincy Simmonds -Rx sent was for macrobid 100 mg bid x10 days, please clarify.

## 2019-10-25 NOTE — Telephone Encounter (Signed)
Spoke to Megan Davila. Medication clarified to Megan Davila to take only for 5 days. Megan Davila verbalized understanding. Megan Davila also wanting to request diflucan because always gets a yeast infection when taking antibiotics. Has taken in the past.  Will route to Dr Quincy Simmonds for review and Diflucan request pended if approved.

## 2019-10-25 NOTE — Telephone Encounter (Addendum)
Opened in error. Encounter closed.

## 2019-10-25 NOTE — Addendum Note (Signed)
Addended by: Georgia Lopes on: 10/25/2019 04:18 PM   Modules accepted: Orders

## 2019-10-25 NOTE — Telephone Encounter (Signed)
Ok for Diflucan 150 mg po x 1.  May repeat in 72 hours prn. Disp:  2 RF:  None.

## 2019-10-25 NOTE — Addendum Note (Signed)
Addended by: Georgia Lopes on: 10/25/2019 03:36 PM   Modules accepted: Orders

## 2019-10-25 NOTE — Telephone Encounter (Signed)
Spoke to pt. Pt aware of Diflucan Rx. Rx sent to pharmacy on file.   Will close encounter.

## 2019-11-04 LAB — CYTOLOGY - PAP
Comment: NEGATIVE
Diagnosis: UNDETERMINED — AB
High risk HPV: POSITIVE — AB

## 2019-11-05 ENCOUNTER — Telehealth: Payer: Self-pay | Admitting: Obstetrics and Gynecology

## 2019-11-05 ENCOUNTER — Ambulatory Visit: Payer: Self-pay

## 2019-11-05 ENCOUNTER — Telehealth: Payer: Self-pay

## 2019-11-05 DIAGNOSIS — R87612 Low grade squamous intraepithelial lesion on cytologic smear of cervix (LGSIL): Secondary | ICD-10-CM

## 2019-11-05 NOTE — Progress Notes (Deleted)
  Subjective:     Patient ID: SOLANCH RICHOUX, female   DOB: October 11, 1966, 53 y.o.   MRN: [id number]  HPI  Patient here today for colposcopy with pap 10-22-19 ASCUS:Pos HR HPV.  Pap History: 10-22-19 ASCUS:Pos HR HPV 04-22-19   Neg:Neg HR HPV 09-25-18  Neg:Neg HR HPV;LEEP showed normal cells 04-17-18     LGSIL:Pos HR HPV:04-27-18 colpo showed LGSIL 11-20-15   Neg:Neg HR HPV 1993 Hx conization of cervix--CIN 3, 10-07-13 pap Ascus:Pos HR HPV;LEEP procedure--LGSIL.05-14-14 pap Neg:Pos HR HPV with repeat pap 10-09-14 Neg:Neg HR HPV. Review of Systems  LMP: Contraception: Tubal UPT:     Objective:   Physical Exam     Assessment:     ***    Plan:     ***

## 2019-11-05 NOTE — Telephone Encounter (Signed)
Spoke to pt. Gave lab results from pap smear and needing a colpo. Pt scheduled for colpo on 11/06/19 at 10am.   Will route to Dr Quincy Simmonds for review and will close encounter.  Cc: Wallace, Suzy, orders placed for colpo

## 2019-11-05 NOTE — Telephone Encounter (Signed)
Call placed to convey benefits for colposcopy. °

## 2019-11-06 ENCOUNTER — Ambulatory Visit: Payer: Self-pay

## 2019-11-06 ENCOUNTER — Ambulatory Visit: Payer: Self-pay | Admitting: Obstetrics and Gynecology

## 2019-11-06 ENCOUNTER — Other Ambulatory Visit: Payer: Self-pay | Admitting: Obstetrics and Gynecology

## 2019-11-06 ENCOUNTER — Ambulatory Visit: Payer: 59 | Admitting: Obstetrics and Gynecology

## 2019-11-06 ENCOUNTER — Encounter: Payer: Self-pay | Admitting: Obstetrics and Gynecology

## 2019-11-06 ENCOUNTER — Other Ambulatory Visit: Payer: Self-pay

## 2019-11-06 VITALS — BP 110/70 | HR 60 | Temp 96.7°F | Ht 63.0 in | Wt 200.0 lb

## 2019-11-06 DIAGNOSIS — N882 Stricture and stenosis of cervix uteri: Secondary | ICD-10-CM

## 2019-11-06 DIAGNOSIS — R87612 Low grade squamous intraepithelial lesion on cytologic smear of cervix (LGSIL): Secondary | ICD-10-CM

## 2019-11-06 DIAGNOSIS — R87810 Cervical high risk human papillomavirus (HPV) DNA test positive: Secondary | ICD-10-CM

## 2019-11-06 DIAGNOSIS — N3000 Acute cystitis without hematuria: Secondary | ICD-10-CM

## 2019-11-06 DIAGNOSIS — R87610 Atypical squamous cells of undetermined significance on cytologic smear of cervix (ASC-US): Secondary | ICD-10-CM

## 2019-11-06 MED ORDER — CEPHALEXIN 500 MG PO CAPS: 500.0000 mg | ORAL_CAPSULE | Freq: Four times a day (QID) | ORAL | 0 refills | Status: DC

## 2019-11-06 NOTE — Progress Notes (Signed)
  Subjective:     Patient ID: Megan Davila, female   DOB: 07/30/1966, 53 y.o.   MRN: [id number]  HPI  Patient here today for colposcopy with pap 10-22-19 ASCUS:Pos HR HPV.   Pap History: 04-22-19 Neg:Neg HR HPV, 09-25-18 Neg:Neg HR HPV, 05/08/18 LEEP showed normal cells, 04/27/18 Colpo showed LGSIL, 04-17-18 LGSIL:Pos HR HPV, 02-17-17 Neg:Neg HR HPV, 11-20-15 Neg:Neg HR HPV History of abnormal Pap:  [id number] Hx conization of cervix--CIN 3, 10-07-13 pap Ascus:Pos HR HPV;LEEP procedure--LGSIL.05-14-14 pap Neg:Pos HR HPV with repeat pap 10-09-14 Neg:Neg HR HPV.  UC from 10/22/19 showing E Coli > 100,000. States the Macrobid dis not helping her symptoms.  She has completed the 5 days of treatment.  She is still having frequency of urination.  This is not her usual pattern.  Review of Systems  All other systems reviewed and are negative.   LMP:03/2019 Contraception: Tubal     Objective:   Physical Exam Genitourinary:     Colposcopy - cervix, vagina. Consent for procedure.  3% acetic acid used in vagina. White light and green light filter used.  Colposcopy satisfactory:  Yes   _____          No    _x___ Findings:    Cervix:  Stenotic.  Island of acetowhite change at 7:00 outside the transformation zone.  With Lugol's applied, there is decreased uptake circumferentially around the cervix.  Vagina: no lesions. Biopsies:  7:00 and 1:00   Monsel's placed.  Minimal EBL. No complications.      Assessment:     E Coli UTI.  ASCUS and HR HPV positive.  Cervical stenosis.    Plan:     Will tx with Keflex 500 mg po qid x 7 days.  Return if urinary symptoms are not improved.  FU biopsies.  Instructions and precautions given.  Possible LEEP versus conization.  I did broach the subject of hysterectomy for recurrent dysplasia and clarified that this will not remove HPV and possible need for colposcopy and treatment of vaginal dysplasia in the future.   ___15____ minutes face to face  time of which over 50% was spent in counseling.

## 2019-11-06 NOTE — Patient Instructions (Signed)
surg Colposcopy, Care After This sheet gives you information about how to care for yourself after your procedure. Your doctor may also give you more specific instructions. If you have problems or questions, contact your doctor. What can I expect after the procedure? If you did not have a tissue sample removed (did not have a biopsy), you may only have some spotting for a few days. You can go back to your normal activities. If you had a tissue sample removed, it is common to have:  Soreness and pain. This may last for a few days.  Light-headedness.  Mild bleeding from your vagina or dark-colored, grainy discharge from your vagina. This may last for a few days. You may need to wear a sanitary pad.  Spotting for at least 48 hours after the procedure. Follow these instructions at home:   Take over-the-counter and prescription medicines only as told by your doctor. Ask your doctor what medicines you can start taking again. This is very important if you take blood-thinning medicine.  Do not drive or use heavy machinery while taking prescription pain medicine.  For 3 days, or as long as your doctor tells you, avoid: ? Douching. ? Using tampons. ? Having sex.  If you use birth control (contraception), keep using it.  Limit activity for the first day after the procedure. Ask your doctor what activities are safe for you.  It is up to you to get the results of your procedure. Ask your doctor when your results will be ready.  Keep all follow-up visits as told by your doctor. This is important. Contact a doctor if:  You get a skin rash. Get help right away if:  You are bleeding a lot from your vagina. It is a lot of bleeding if you are using more than one pad an hour for 2 hours in a row.  You have clumps of blood (blood clots) coming from your vagina.  You have a fever.  You have chills  You have pain in your lower belly (pelvic area).  You have signs of infection, such as vaginal  discharge that is: ? Different than usual. ? Yellow. ? Bad-smelling.  You have very pain or cramps in your lower belly that do not get better with medicine.  You feel light-headed.  You feel dizzy.  You pass out (faint). Summary  If you did not have a tissue sample removed (did not have a biopsy), you may only have some spotting for a few days. You can go back to your normal activities.  If you had a tissue sample removed, it is common to have mild pain and spotting for 48 hours.  For 3 days, or as long as your doctor tells you, avoid douching, using tampons and having sex.  Get help right away if you have bleeding, very bad pain, or signs of infection. This information is not intended to replace advice given to you by your health care provider. Make sure you discuss any questions you have with your health care provider. Document Released: 05/09/2008 Document Revised: 11/03/2017 Document Reviewed: 08/10/2016 Elsevier Patient Education  2020 [employer].

## 2019-11-12 ENCOUNTER — Encounter: Payer: Self-pay | Admitting: Obstetrics and Gynecology

## 2019-11-13 ENCOUNTER — Telehealth: Payer: Self-pay

## 2019-11-13 DIAGNOSIS — R87612 Low grade squamous intraepithelial lesion on cytologic smear of cervix (LGSIL): Secondary | ICD-10-CM

## 2019-11-13 NOTE — Telephone Encounter (Signed)
Spoke with pt. Pt given colposcopy results and the need for LEEP procedure. Pt scheduled LEEP on 11/18/19 at 1030 with Dr Quincy Simmonds. Pt aware of procedure and to take Motrin 800 mg with food and water one hour before procedure. Pt agreeable. Pt aware of call for benefits.   Will route to Dr Quincy Simmonds for review and will close encounter.   CC: Weston Brass for precert. Orders placed.

## 2019-11-14 ENCOUNTER — Inpatient Hospital Stay: Payer: 59 | Attending: Hematology & Oncology

## 2019-11-14 ENCOUNTER — Other Ambulatory Visit: Payer: Self-pay

## 2019-11-14 VITALS — BP 132/94 | HR 73 | Temp 98.3°F | Resp 18

## 2019-11-14 DIAGNOSIS — D510 Vitamin B12 deficiency anemia due to intrinsic factor deficiency: Secondary | ICD-10-CM | POA: Insufficient documentation

## 2019-11-14 DIAGNOSIS — K912 Postsurgical malabsorption, not elsewhere classified: Secondary | ICD-10-CM

## 2019-11-14 MED ORDER — CYANOCOBALAMIN 1000 MCG/ML IJ SOLN
1000.0000 ug | Freq: Once | INTRAMUSCULAR | Status: AC
  Administered 2019-11-14: 1000 ug via INTRAMUSCULAR

## 2019-11-14 NOTE — Patient Instructions (Signed)
Cyanocobalamin, Pyridoxine, and Folate What is this medicine? A multivitamin containing folic acid, vitamin B6, and vitamin B12. This medicine may be used for other purposes; ask your health care provider or pharmacist if you have questions. COMMON BRAND NAME(S): AllanFol RX, AllanTex, Av-Vite FB, B Complex with Folic Acid, ComBgen, FaBB, Folamin, Folastin, Folbalin, Folbee, Folbic, Folcaps, Folgard, Folgard RX, Folgard RX 2.2, Folplex, Folplex 2.2, Foltabs 800, Foltx, Homocysteine Formula, Niva-Fol, NuFol, TL Gard RX, Virt-Gard, Virt-Vite, Virt-Vite Forte, Vita-Respa What should I tell my health care provider before I take this medicine? They need to know if you have any of these conditions:  bleeding or clotting disorder  history of anemia of any type  other chronic health condition  an unusual or allergic reaction to vitamins, other medicines, foods, dyes, or preservatives  pregnant or trying to get pregnant  breast-feeding How should I use this medicine? Take by mouth with a glass of water. May take with food. Follow the directions on the prescription label. It is usually given once a day. Do not take your medicine more often than directed. Contact your pediatrician regarding the use of this medicine in children. Special care may be needed. Overdosage: If you think you have taken too much of this medicine contact a poison control center or emergency room at once. NOTE: This medicine is only for you. Do not share this medicine with others. What if I miss a dose? If you miss a dose, take it as soon as you can. If it is almost time for your next dose, take only that dose. Do not take double or extra doses. What may interact with this medicine?  levodopa This list may not describe all possible interactions. Give your health care provider a list of all the medicines, herbs, non-prescription drugs, or dietary supplements you use. Also tell them if you smoke, drink alcohol, or use illegal  drugs. Some items may interact with your medicine. What should I watch for while using this medicine? See your health care professional for regular checks on your progress. Remember that vitamin supplements do not replace the need for good nutrition from a balanced diet. What side effects may I notice from receiving this medicine? Side effects that you should report to your doctor or health care professional as soon as possible:  allergic reaction such as skin rash or difficulty breathing  vomiting Side effects that usually do not require medical attention (report to your doctor or health care professional if they continue or are bothersome):  nausea  stomach upset This list may not describe all possible side effects. Call your doctor for medical advice about side effects. You may report side effects to FDA at 1-800-FDA-1088. Where should I keep my medicine? Keep out of the reach of children. Most vitamins should be stored at controlled room temperature. Check your specific product directions. Protect from heat and moisture. Throw away any unused medicine after the expiration date. NOTE: This sheet is a summary. It may not cover all possible information. If you have questions about this medicine, talk to your doctor, pharmacist, or health care provider.  2020 Elsevier/Gold Standard (2008-01-12 00:59:55)  

## 2019-11-14 NOTE — Progress Notes (Signed)
  Subjective:     Patient ID: Megan Davila, female   DOB: [birth year], 53 y.o.   MRN: [id number]  HPI  Patient here today for LEEP procedure.   Pap History:  10-22-19 ASCUS:Pos HR HPV;colpo 11-06-19 revealing LGSIL--not able to assess endocervix.  04-22-19 Neg:Neg HR HPV, 09-25-18 Neg:Neg HR HPV, 05/08/18 LEEP showed normal cells, 04/27/18 Colpo showed LGSIL, 04-17-18 LGSIL:Pos HR HPV,02-17-17 Neg:Neg HR HPV, 11-20-15 Neg:Neg HR HPV History of abnormal Pap:Yes,1993 Hx conization of cervix--CIN 3, 10-07-13 pap Ascus:Pos HR HPV;LEEP procedure--LGSIL.05-14-14 pap Neg:Pos HR HPV with repeat pap 10-09-14 Neg:Neg HR HPV.  Seeing Dr. Chalmers Cater for her endocrinology care.  Elevated ACTH.  She is now going to do a 24 hour urine collection.   Review of Systems  All other systems reviewed and are negative.   LMP: 03-09-19 Contraception: Tubal     Objective:   Physical Exam Genitourinary:     Consent for procedure.  Colposcopy performed.  3% acetic acid applied to cervix and vagina.  Unsatisfactory colposcopy. Inflammation of the cervix at 2:00 and 7:00.  Lugol's placed and decreased uptake from 12:00 - 6:00.  Local 1% lidocaine injected circumferentially on the cervix with epinephrine 1:100,000.  Lot number [id number], exp 3/22. Two passes of the exocervix performed with cutting at 50 watts.  Pass of the endocervix performed with cutting at 50 watts. ECC with Kevorkian curette.  All tissue specimens to pathology.  Specimen:  1 - Exocervix - 2 pieces.  Blue pin at 12:00.  Green pin in os.                       2 - Endocervix - pink pin in os.                      3 - ECC.    Cautery to the perimeter of the LEEP bed with setting of 50 watts of coagulation. Monsel's placed.  Minimal EBL.  No complications.   Posterior cervix is now becoming short.  Assessment:     CIN I. Cervical stenosis.  Hx multiple prior cervical conization/LEEP procedures.  Recent elevated ACTH.     Plan:      Fu biopsies.  Patient and I did discuss potential hysterectomy for her recurrent dysplasia.  She understands that this will not eliminate the HPV and future abnormal paps but would eliminate risk of cervical cancer and uterine cancer.  Her tubes would be removed and her ovaries would be retained if normal.

## 2019-11-15 NOTE — Telephone Encounter (Signed)
Call placed to convey benefits for LEEP.

## 2019-11-18 ENCOUNTER — Other Ambulatory Visit: Payer: Self-pay

## 2019-11-18 ENCOUNTER — Other Ambulatory Visit (HOSPITAL_COMMUNITY)
Admission: RE | Admit: 2019-11-18 | Discharge: 2019-11-18 | Disposition: A | Discharge status: Home / Self Care | Payer: 59 | Source: Ambulatory Visit | Attending: Obstetrics and Gynecology | Admitting: Obstetrics and Gynecology

## 2019-11-18 ENCOUNTER — Ambulatory Visit (INDEPENDENT_AMBULATORY_CARE_PROVIDER_SITE_OTHER): Payer: 59 | Admitting: Obstetrics and Gynecology

## 2019-11-18 ENCOUNTER — Encounter: Payer: Self-pay | Admitting: Obstetrics and Gynecology

## 2019-11-18 VITALS — BP 114/80 | HR 70 | Temp 96.5°F | Ht 63.0 in | Wt 199.6 lb

## 2019-11-18 DIAGNOSIS — N870 Mild cervical dysplasia: Secondary | ICD-10-CM | POA: Insufficient documentation

## 2019-11-18 DIAGNOSIS — R87612 Low grade squamous intraepithelial lesion on cytologic smear of cervix (LGSIL): Secondary | ICD-10-CM

## 2019-11-18 DIAGNOSIS — N882 Stricture and stenosis of cervix uteri: Secondary | ICD-10-CM

## 2019-11-18 NOTE — Patient Instructions (Signed)
Total Laparoscopic Hysterectomy A total laparoscopic hysterectomy is a minimally invasive surgery to remove the uterus and cervix. The fallopian tubes and ovaries can also be removed (bilateral salpingo-oophorectomy) during this surgery, if necessary. This procedure may be done to treat problems such as:  Noncancerous growths in the uterus (uterine fibroids) that cause symptoms.  A condition that causes the lining of the uterus (endometrium) to grow in other areas (endometriosis).  Problems with pelvic support. This is caused by weakened muscles of the pelvis following vaginal childbirth or menopause.  Cancer of the cervix, ovaries, uterus, or endometrium.  Excessive (dysfunctional) uterine bleeding. This surgery is performed by inserting a thin, lighted tube (laparoscope) and surgical instruments into small incisions in the abdomen. The laparoscope sends images to a monitor. The images help the health care provider perform the procedure. After this procedure, you will no longer be able to have a baby, and you will no longer have a menstrual period. Tell a health care provider about:  Any allergies you have.  All medicines you are taking, including vitamins, herbs, eye drops, creams, and over-the-counter medicines.  Any problems you or family members have had with anesthetic medicines.  Any blood disorders you have.  Any surgeries you have had.  Any medical conditions you have.  Whether you are pregnant or may be pregnant. What are the risks? Generally, this is a safe procedure. However, problems may occur, including:  Infection.  Bleeding.  Blood clots in the legs or lungs.  Allergic reactions to medicines.  Damage to other structures or organs.  The risk that the surgery may have to be switched to the regular one in which a large incision is made in the abdomen (abdominal hysterectomy). What happens before the procedure? Staying hydrated Follow instructions from your  health care provider about hydration, which may include:  Up to 2 hours before the procedure - you may continue to drink clear liquids, such as water, clear fruit juice, black coffee, and plain tea Eating and drinking restrictions Follow instructions from your health care provider about eating and drinking, which may include:  8 hours before the procedure - stop eating heavy meals or foods such as meat, fried foods, or fatty foods.  6 hours before the procedure - stop eating light meals or foods, such as toast or cereal.  6 hours before the procedure - stop drinking milk or drinks that contain milk.  2 hours before the procedure - stop drinking clear liquids. Medicines  Ask your health care provider about: ? Changing or stopping your regular medicines. This is especially important if you are taking diabetes medicines or blood thinners. ? Taking over-the-counter medicines, vitamins, herbs, and supplements. ? Taking medicines such as aspirin and ibuprofen. These medicines can thin your blood. Do not take these medicines unless your health care provider tells you to take them.  You may be given antibiotic medicine to help prevent infection.  You may be asked to take laxatives.  You may be given medicines to help prevent nausea and vomiting after the procedure. General instructions  Ask your health care provider how your surgical site will be marked or identified.  You may be asked to shower with a germ-killing soap.  Do not use any products that contain nicotine or tobacco, such as cigarettes and e-cigarettes. If you need help quitting, ask your health care provider.  You may have an exam or testing, such as an ultrasound to determine the size and shape of your pelvic organs.    You may have a blood or urine sample taken.  This procedure can affect the way you feel about yourself. Talk with your health care provider about the physical and emotional changes hysterectomy may  cause.  Plan to have someone take you home from the hospital or clinic.  Plan to have a responsible adult care for you for at least 24 hours after you leave the hospital or clinic. This is important. What happens during the procedure?  To lower your risk of infection: ? Your health care team will wash or sanitize their hands. ? Your skin will be washed with soap. ? Hair may be removed from the surgical area.  An IV will be inserted into one of your veins.  You will be given one or more of the following: ? A medicine to help you relax (sedative). ? A medicine to make you fall asleep (general anesthetic).  You will be given antibiotic medicine through your IV.  A tube may be inserted down your throat to help you breathe during the procedure.  A gas (carbon dioxide) will be used to inflate your abdomen to allow your surgeon to see inside of your abdomen.  Three or four small incisions will be made in your abdomen.  A laparoscope will be inserted into one of your incisions. Surgical instruments will be inserted through the other incisions in order to perform the procedure.  Your uterus and cervix may be removed through your vagina or cut into small pieces and removed through the small incisions. Any other organs that need to be removed will also be removed this way.  Carbon dioxide will be released from inside of your abdomen.  Your incisions will be closed with stitches (sutures).  A bandage (dressing) may be placed over your incisions. The procedure may vary among health care providers and hospitals. What happens after the procedure?  Your blood pressure, heart rate, breathing rate, and blood oxygen level will be monitored until the medicines you were given have worn off.  You will be given medicine for pain and nausea as needed.  Do not drive for 24 hours if you received a sedative. Summary  Total Laparoscopic hysterectomy is a procedure to remove your uterus, cervix and  sometimes the fallopian tubes and ovaries.  This procedure can affect the way you feel about yourself. Talk with your health care provider about the physical and emotional changes hysterectomy may cause.  After this procedure, you will no longer be able to have a baby, and you will no longer have a menstrual period.  You will be given pain medicine to control discomfort after this procedure. This information is not intended to replace advice given to you by your health care provider. Make sure you discuss any questions you have with your health care provider. Document Released: 09/18/2007 Document Revised: 11/03/2017 Document Reviewed: 02/01/2017 Elsevier Patient Education  [employer]. Cervical Conization, Care After This sheet gives you information about how to care for yourself after your procedure. Your doctor may also give you more specific instructions. If you have problems or questions, contact your doctor. Follow these instructions at home: Medicines   Take over-the-counter and prescription medicines only as told by your doctor.  Do not take aspirin until your doctor says it is okay.  If you take pain medicine: ? You may have constipation. To help treat this, your doctor may tell you to:  Drink enough fluid to keep your pee (urine) clear or pale yellow.  Take  medicines.  Eat foods that are high in fiber. These include fresh fruits and vegetables, whole grains, bran, and beans.  Limit foods that are high in fat and sugar. These include fried foods and sweet foods. ? Do not drive or use heavy machines. General instructions  You can eat your usual diet unless your doctor tells you not to do so.  Take showers for the first week. Do not take baths, swim, or use hot tubs until your doctor says it is okay.  Do not douche, use tampons, or have sex until your doctor says it is okay.  For 7-14 days after your procedure, avoid: ? Being very active. ? Exercising. ? Heavy  lifting.  Keep all follow-up visits as told by your doctor. This is important. Contact a doctor if:  You have a rash.  You are dizzy or lightheaded.  You feel sick to your stomach (nauseous).  You throw up (vomit).  You have fluid from your vagina (vaginal discharge) that smells bad. Get help right away if:  There are blood clots coming from your vagina.  You have more bleeding than you would have in a normal period. For example, you soak a pad in less than 1 hour.  You have a fever.  You have more and more cramps.  You pass out (faint).  You have pain when peeing.  Your have a lot of pain.  Your pain gets worse.  Your pain does not get better when you take your medicine.  You have blood in your pee.  You throw up (vomit). Summary  After your procedure, take over-the-counter and prescription medicines only as told by your doctor.  Do not douche, use tampons, or have sex until your doctor says it is okay.  For about 7-14 days after your procedure, try not to exercise or lift heavy objects.  Get help right away if you have new symptoms, or if your symptoms become worse. This information is not intended to replace advice given to you by your health care provider. Make sure you discuss any questions you have with your health care provider. Document Released: 08/30/2008 Document Revised: 11/03/2017 Document Reviewed: 11/23/2016 Elsevier Patient Education  2020 [employer].

## 2019-11-20 LAB — SURGICAL PATHOLOGY

## 2019-11-21 ENCOUNTER — Encounter: Payer: Self-pay | Admitting: Obstetrics and Gynecology

## 2019-11-21 ENCOUNTER — Telehealth: Payer: Self-pay | Admitting: Obstetrics and Gynecology

## 2019-11-21 NOTE — Telephone Encounter (Signed)
Spoke with patient, see result note dated 11/21/19.   Encounter closed.

## 2019-11-21 NOTE — Telephone Encounter (Signed)
Patient sent the following message through [facility]. Routing to triage to assist patient with request.  [place], [facility]  Phone Number: [phone]  I have a question about SURGICAL PATHOLOGY EXAM resulted on 11/20/19, 3:16 PM.   Hi Dr. Harolyn Rutherford,  Does this mean we can put off a hysterectomy? What is your opinion?  Thank you,  Megan Davila

## 2019-12-03 ENCOUNTER — Other Ambulatory Visit: Payer: 59

## 2019-12-05 ENCOUNTER — Inpatient Hospital Stay: Payer: 59

## 2019-12-05 ENCOUNTER — Inpatient Hospital Stay: Payer: 59 | Admitting: Hematology & Oncology

## 2019-12-05 ENCOUNTER — Other Ambulatory Visit: Payer: 59

## 2019-12-06 DIAGNOSIS — Z8719 Personal history of other diseases of the digestive system: Secondary | ICD-10-CM

## 2019-12-06 HISTORY — DX: Personal history of other diseases of the digestive system: Z87.19

## 2019-12-09 ENCOUNTER — Inpatient Hospital Stay: Payer: 59 | Attending: Hematology & Oncology

## 2019-12-09 DIAGNOSIS — N920 Excessive and frequent menstruation with regular cycle: Secondary | ICD-10-CM | POA: Insufficient documentation

## 2019-12-09 DIAGNOSIS — D510 Vitamin B12 deficiency anemia due to intrinsic factor deficiency: Secondary | ICD-10-CM | POA: Insufficient documentation

## 2019-12-09 DIAGNOSIS — D500 Iron deficiency anemia secondary to blood loss (chronic): Secondary | ICD-10-CM | POA: Insufficient documentation

## 2019-12-11 ENCOUNTER — Inpatient Hospital Stay: Payer: 59 | Admitting: Family

## 2019-12-11 ENCOUNTER — Inpatient Hospital Stay: Payer: 59

## 2019-12-13 ENCOUNTER — Other Ambulatory Visit: Payer: 59

## 2019-12-13 ENCOUNTER — Ambulatory Visit: Payer: 59

## 2019-12-13 ENCOUNTER — Ambulatory Visit: Payer: 59 | Admitting: Hematology & Oncology

## 2019-12-13 ENCOUNTER — Ambulatory Visit: Payer: 59 | Admitting: Family

## 2019-12-16 ENCOUNTER — Other Ambulatory Visit: Payer: Self-pay | Admitting: Obstetrics and Gynecology

## 2019-12-16 ENCOUNTER — Other Ambulatory Visit: Payer: Self-pay

## 2019-12-16 ENCOUNTER — Inpatient Hospital Stay: Payer: 59

## 2019-12-16 ENCOUNTER — Telehealth: Payer: Self-pay | Admitting: *Deleted

## 2019-12-16 DIAGNOSIS — D508 Other iron deficiency anemias: Secondary | ICD-10-CM

## 2019-12-16 DIAGNOSIS — N920 Excessive and frequent menstruation with regular cycle: Secondary | ICD-10-CM | POA: Diagnosis present

## 2019-12-16 DIAGNOSIS — D510 Vitamin B12 deficiency anemia due to intrinsic factor deficiency: Secondary | ICD-10-CM

## 2019-12-16 DIAGNOSIS — D500 Iron deficiency anemia secondary to blood loss (chronic): Secondary | ICD-10-CM | POA: Diagnosis present

## 2019-12-16 LAB — CMP (CANCER CENTER ONLY)
ALT: 79 U/L — ABNORMAL HIGH (ref 0–44)
AST: 38 U/L (ref 15–41)
Albumin: 3.5 g/dL (ref 3.5–5.0)
Alkaline Phosphatase: 97 U/L (ref 38–126)
Anion gap: 11 (ref 5–15)
BUN: 9 mg/dL (ref 6–20)
CO2: 24 mmol/L (ref 22–32)
Calcium: 8.6 mg/dL — ABNORMAL LOW (ref 8.9–10.3)
Chloride: 103 mmol/L (ref 98–111)
Creatinine: 0.84 mg/dL (ref 0.44–1.00)
GFR, Est AFR Am: >60 mL/min (ref >60)
GFR, Estimated: >60 mL/min (ref >60)
Glucose, Bld: 108 mg/dL — ABNORMAL HIGH (ref 70–99)
Potassium: 3.5 mmol/L (ref 3.5–5.1)
Sodium: 138 mmol/L (ref 135–145)
Total Bilirubin: 0.3 mg/dL (ref 0.3–1.2)
Total Protein: 6.7 g/dL (ref 6.5–8.1)

## 2019-12-16 LAB — CBC WITH DIFFERENTIAL (CANCER CENTER ONLY)
Abs Immature Granulocytes: 0.02 10*3/uL (ref 0.00–0.07)
Basophils Absolute: 0 10*3/uL (ref 0.0–0.1)
Basophils Relative: 1 %
Eosinophils Absolute: 0.2 10*3/uL (ref 0.0–0.5)
Eosinophils Relative: 3 %
HCT: 44.1 % (ref 36.0–46.0)
Hemoglobin: 14.3 g/dL (ref 12.0–15.0)
Immature Granulocytes: 0 %
Lymphocytes Relative: 28 %
Lymphs Abs: 1.8 10*3/uL (ref 0.7–4.0)
MCH: 30.7 pg (ref 26.0–34.0)
MCHC: 32.4 g/dL (ref 30.0–36.0)
MCV: 94.6 fL (ref 80.0–100.0)
Monocytes Absolute: 0.4 10*3/uL (ref 0.1–1.0)
Monocytes Relative: 7 %
Neutro Abs: 3.9 10*3/uL (ref 1.7–7.7)
Neutrophils Relative %: 61 %
Platelet Count: 290 10*3/uL (ref 150–400)
RBC: 4.66 MIL/uL (ref 3.87–5.11)
RDW: 12 % (ref 11.5–15.5)
WBC Count: 6.4 10*3/uL (ref 4.0–10.5)
nRBC: 0 % (ref 0.0–0.2)

## 2019-12-16 LAB — IRON AND TIBC
Iron: 97 ug/dL (ref 41–142)
Saturation Ratios: 31 % (ref 21–57)
TIBC: 316 ug/dL (ref 236–444)
UIBC: 219 ug/dL (ref 120–384)

## 2019-12-16 LAB — VITAMIN B12: Vitamin B-12: 351 pg/mL (ref 180–914)

## 2019-12-16 LAB — FERRITIN: Ferritin: 232 ng/mL (ref 11–307)

## 2019-12-16 NOTE — Telephone Encounter (Signed)
Message received from patient requesting iron results from today.  Call placed back to patient and patient given iron results per order of S. [place] NP.  Pt appreciative of call back and has no further questions at this time.

## 2019-12-17 ENCOUNTER — Inpatient Hospital Stay: Payer: 59 | Admitting: Family

## 2019-12-17 ENCOUNTER — Inpatient Hospital Stay: Payer: 59

## 2019-12-17 ENCOUNTER — Encounter: Payer: Self-pay | Admitting: Family

## 2019-12-17 ENCOUNTER — Telehealth: Payer: Self-pay | Admitting: Family

## 2019-12-17 VITALS — BP 121/68 | HR 93 | Temp 96.9°F | Resp 19 | Ht 63.5 in | Wt 202.0 lb

## 2019-12-17 DIAGNOSIS — R748 Abnormal levels of other serum enzymes: Secondary | ICD-10-CM | POA: Diagnosis not present

## 2019-12-17 DIAGNOSIS — D508 Other iron deficiency anemias: Secondary | ICD-10-CM | POA: Diagnosis not present

## 2019-12-17 DIAGNOSIS — K912 Postsurgical malabsorption, not elsewhere classified: Secondary | ICD-10-CM | POA: Diagnosis not present

## 2019-12-17 DIAGNOSIS — N920 Excessive and frequent menstruation with regular cycle: Secondary | ICD-10-CM | POA: Diagnosis not present

## 2019-12-17 DIAGNOSIS — Z903 Acquired absence of stomach [part of]: Secondary | ICD-10-CM

## 2019-12-17 DIAGNOSIS — D510 Vitamin B12 deficiency anemia due to intrinsic factor deficiency: Secondary | ICD-10-CM

## 2019-12-17 MED ORDER — CYANOCOBALAMIN 1000 MCG/ML IJ SOLN
1000.0000 ug | Freq: Once | INTRAMUSCULAR | Status: AC
  Administered 2019-12-17: 14:00:00 1000 ug via INTRAMUSCULAR

## 2019-12-17 MED ORDER — CYANOCOBALAMIN 1000 MCG/ML IJ SOLN
INTRAMUSCULAR | Status: AC
  Filled 2019-12-17: qty 1

## 2019-12-17 NOTE — Telephone Encounter (Signed)
Appointments scheduled calendar printed per 1/12 los 

## 2019-12-17 NOTE — Patient Instructions (Signed)

## 2019-12-17 NOTE — Progress Notes (Signed)
Hematology and Oncology Follow Up Visit  Megan Davila [id number] 03-14-1966 54 y.o. 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 Iron studies looked good so no infusion needed this visit.  She still feels fatigued and notes occasional numbness and tingling in her fingertips or feet.  No episodes of bleeding. No bruising or petechiae.  No fever, chills, n/v, cough, rash, dizziness, SOB, chest pain, palpitations, abdominal pain or changes in bowel or bladder habits.  No swelling, tenderness, numbness or tingling in her extremities at this time.  No syncopal episodes to report. She did trip and fall recently but thankfully was not injured.  She has maintained a healthy appetite and is hydrating properly. Her weight is stable.   ECOG Performance Status: 1 - Symptomatic but completely ambulatory  Medications:  Allergies as of 12/17/2019      Reactions   Naproxen Anaphylaxis   Monistat [miconazole] Other (See Comments)   "burning"   Sulfa Antibiotics Hives   Tioconazole Itching      Medication List       Accurate as of December 17, 2019  1:27 PM. If you have any questions, ask your nurse or doctor.        STOP taking these medications   cephALEXin 500 MG capsule Commonly known as: Keflex Stopped by: Laverna Peace, NP   dicyclomine 10 MG capsule Commonly known as: BENTYL Stopped by: Laverna Peace, NP     TAKE these medications   Aimovig 140 MG/ML Soaj Generic drug: Erenumab-aooe every 30 (thirty) days.   ALPRAZolam 1 MG tablet Commonly known as: XANAX Take 1 mg by mouth 2 (two) times daily as needed. Anxiety   Armodafinil 250 MG tablet Take 250 mg by mouth every morning.   baclofen  10 MG tablet Commonly known as: LIORESAL Take by mouth.   Botulinum Toxin Type A (Cosm) 100 units Solr once. Unaware of dose, pt gets q 3mo for migraines   botulinum toxin Type A 100 units Solr injection Commonly known as: BOTOX once. Unaware of dose, pt gets q 21mo for migraines   Contrave 8-90 MG Tb12 Generic drug: Naltrexone-buPROPion HCl ER Take 2 tablets by mouth 2 (two) times daily.   Dexilant 60 MG capsule Generic drug: dexlansoprazole Take 60 mg by mouth daily.   diphenhydrAMINE 12.5 MG/5ML elixir Commonly known as: BENADRYL Take 12.5 mg by mouth every 6 (six) hours as needed.   divalproex 500 MG DR tablet Commonly known as: DEPAKOTE Take 1 tablet (500 mg total) by mouth 2 (two) times daily.   doxepin 75 MG capsule Commonly known as: SINEQUAN 75 mg at bedtime.   eletriptan 40 MG tablet Commonly known as: RELPAX Take 1 tablet (40 mg total) by mouth as needed for migraine or headache. May repeat in 2 hours if needed. Max 2 tabs per day or 8 tabs per month.   ergocalciferol 1.25 MG (50000 UT) capsule Commonly known as: VITAMIN D2 Take 50,000 Units by mouth. She is taking daily M-F - pr pt   Eszopiclone 3 MG Tabs TK 1 T PO QD HS   fexofenadine 180 MG tablet Commonly known as: ALLEGRA Take 180 mg by mouth daily as needed. allergies   fluconazole 150 MG  tablet Commonly known as: DIFLUCAN Take 1 tablet by mouth once and may repeat in 72 hours if needed.   fluticasone 50 MCG/ACT nasal spray Commonly known as: FLONASE Place 1 spray into both nostrils as needed.   gabapentin 300 MG capsule Commonly known as: NEURONTIN Take one pill at night for 1 week.  Increase to 2 pills at night for 1 week.  Increase to 3 pills at night until seen   hydrOXYzine 25 MG tablet Commonly known as: ATARAX/VISTARIL TAKE 1 TABLET BY MOUTH AS NEEDED FOR HEADACHE UPTO EVERY 8 HOURS   imipramine 50 MG tablet Commonly known as: TOFRANIL Take 2 tablets by mouth daily.     lansoprazole 30 MG capsule Commonly known as: PREVACID Take 30 mg by mouth daily as needed. Acid reflux   Lasmiditan Succinate 100 MG Tabs Take by mouth.   levothyroxine 50 MCG tablet Commonly known as: SYNTHROID TAKE 1 TABLET BY MOUTH EVERY DAY 30 MINUTES PRIOR TO EATING   Linzess 145 MCG Caps capsule Generic drug: linaclotide Take 145 mcg by mouth daily.   multivitamin with minerals tablet Take 1 tablet by mouth daily.   nystatin powder Commonly known as: nystatin Apply topically 2 (two) times daily. What changed:   when to take this  reasons to take this   ondansetron 4 MG tablet Commonly known as: ZOFRAN Take 1 tablet by mouth as needed.   Ozempic (0.25 or 0.5 MG/DOSE) 2 MG/1.5ML Sopn Generic drug: Semaglutide(0.25 or 0.5MG /DOS) SMARTSIG:0.25 Milligram(s) SUB-Q Once a Week   Semaglutide(0.25 or 0.5MG /DOS) 2 MG/1.5ML Sopn Inject into the skin.   potassium chloride SA 20 MEQ tablet Commonly known as: KLOR-CON Take 1 tablet (20 mEq total) by mouth daily for 10 days.   promethazine 12.5 MG tablet Commonly known as: PHENERGAN TAKE 1 TABLET(12.5 MG) BY MOUTH EVERY 8 HOURS AS NEEDED FOR NAUSEA OR VOMITING   ranitidine 150 MG capsule Commonly known as: ZANTAC   thiamine 50 MG tablet Commonly known as: VITAMIN B-1 daily.   tiZANidine 4 MG capsule Commonly known as: ZANAFLEX Take 2-4 mg by mouth at bedtime.   Ubrelvy 50 MG Tabs Generic drug: Ubrogepant daily.   venlafaxine XR 150 MG 24 hr capsule Commonly known as: EFFEXOR-XR Take 1 capsule by mouth daily.   Wellbutrin XL 300 MG 24 hr tablet Generic drug: buPROPion TK 1 T PO  D QAM What changed: Another medication with the same name was removed. Continue taking this medication, and follow the directions you see here. Changed by: Laverna Peace, NP   zonisamide 100 MG capsule Commonly known as: ZONEGRAN Take 1 capsule (100 mg total) by mouth 2 (two) times daily.       Allergies:  Allergies   Allergen Reactions  . Naproxen Anaphylaxis  . Monistat [Miconazole] Other (See Comments)    "burning"   . Sulfa Antibiotics Hives  . Tioconazole Itching    Past Medical History, Surgical history, Social history, and Family History were reviewed and updated.  Review of Systems: All other 10 point review of systems is negative.   Physical Exam:  height is 5' 3.5" (1.613 m) and weight is 202 lb 0.6 oz (91.6 kg). Her temporal temperature is 96.9 F (36.1 C) (abnormal). Her blood pressure is 121/68 and her pulse is 93. Her respiration is 19 and oxygen saturation is 98%.   Wt Readings from Last 3 Encounters:  12/17/19 202 lb 0.6 oz (91.6 kg)  11/18/19 199 lb 9.6 oz (90.5 kg)  11/06/19 200  lb (90.7 kg)    Ocular: Sclerae unicteric, pupils equal, round and reactive to light Ear-nose-throat: Oropharynx clear, dentition fair Lymphatic: No cervical or supraclavicular adenopathy Lungs no rales or rhonchi, good excursion bilaterally Heart regular rate and rhythm, no murmur appreciated Abd soft, nontender, positive bowel sounds, no liver or spleen tip palpated on exam, no fluid wave  MSK no focal spinal tenderness, no joint edema Neuro: non-focal, well-oriented, appropriate affect Breasts: Deferred   Lab Results  Component Value Date   WBC 6.4 12/16/2019   HGB 14.3 12/16/2019   HCT 44.1 12/16/2019   MCV 94.6 12/16/2019   PLT 290 12/16/2019   Lab Results  Component Value Date   FERRITIN 232 12/16/2019   IRON 97 12/16/2019   TIBC 316 12/16/2019   UIBC 219 12/16/2019   IRONPCTSAT 31 12/16/2019   Lab Results  Component Value Date   RETICCTPCT 1.1 07/03/2019   RBC 4.66 12/16/2019   RETICCTABS 49.62 09/07/2016   No results found for: KPAFRELGTCHN, LAMBDASER, KAPLAMBRATIO No results found for: IGGSERUM, IGA, IGMSERUM No results found for: Odetta Pink, SPEI   Chemistry      Component Value Date/Time   NA 138 12/16/2019  0948   NA 137 10/18/2017 1630   NA 140 09/07/2016 1327   K 3.5 12/16/2019 0948   K 3.4 (L) 09/07/2016 1327   CL 103 12/16/2019 0948   CO2 24 12/16/2019 0948   CO2 22 09/07/2016 1327   BUN 9 12/16/2019 0948   BUN 18 10/18/2017 1630   BUN 13.3 09/07/2016 1327   CREATININE 0.84 12/16/2019 0948   CREATININE 0.70 12/29/2016 1652   CREATININE 0.8 09/07/2016 1327      Component Value Date/Time   CALCIUM 8.6 (L) 12/16/2019 0948   CALCIUM 8.2 (L) 09/07/2016 1327   ALKPHOS 97 12/16/2019 0948   ALKPHOS 54 09/07/2016 1327   AST 38 12/16/2019 0948   AST 11 09/07/2016 1327   ALT 79 (H) 12/16/2019 0948   ALT <9 09/07/2016 1327   BILITOT 0.3 12/16/2019 0948   BILITOT 0.41 09/07/2016 1327       Impression and Plan: Megan Davila is a very pleasant 54 yo caucasian female with both B 12 and iron deficiency 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 No IV iron needed at this time. She did receive her B 12 today and will continue her every 3 week injection schedule with [facility]. Discussed rechecking CMP in 3 weeks with next injection to reassess her LFT's but she would like to wait until her next follow-up. She will let us know if she develops right sided abdominal pain or bloating.  Follow-up in 3 months.  She will contact our office with any questions or concerns. We can certainly see her sooner if needed.   Laverna Peace, NP 1/12/20211:27 PM

## 2019-12-18 LAB — TSH: TSH: 3.64 u[IU]/mL (ref 0.450–4.500)

## 2019-12-18 LAB — T4, FREE: Free T4: 0.91 ng/dL (ref 0.82–1.77)

## 2019-12-18 LAB — T3, FREE: T3, Free: 3 pg/mL (ref 2.0–4.4)

## 2019-12-24 ENCOUNTER — Ambulatory Visit: Payer: 59 | Admitting: Obstetrics and Gynecology

## 2019-12-24 ENCOUNTER — Encounter: Payer: Self-pay | Admitting: Obstetrics and Gynecology

## 2019-12-24 NOTE — Progress Notes (Deleted)
GYNECOLOGY  VISIT   HPI: 54 y.o.   Divorced  Caucasian  female   G1P1 with No LMP recorded. (Menstrual status: Perimenopausal).   here for 4 week follow up from LEEP procedure.    GYNECOLOGIC HISTORY: No LMP recorded. (Menstrual status: Perimenopausal). Contraception:  Tubal Menopausal hormone therapy:  none Last mammogram: 10-19-17 Neg/density B/BiRads1 Last pap smear:10-22-19 ASCUS:Pos HR HPV,  04-22-19 Neg:Neg HR HPV, 09-25-18 Neg:Neg HR HPV       OB History    Gravida  1   Para  1   Term      Preterm      AB      Living  1     SAB      TAB      Ectopic      Multiple  1   Live Births                 Patient Active Problem List   Diagnosis Date Noted  . Vitamin D deficiency 05/23/2017  . Anxiety 06/19/2015  . Anemia, iron deficiency 06/19/2015  . Pernicious anemia 06/19/2015  . 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 Protein-calorie malnutrition, severe ([place]) 04/18/2015  . Severe protein-calorie malnutrition ([place]) 04/18/2015  . 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 Bariatric surgery status 04/18/2015  . 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 GERD (gastroesophageal reflux disease) 04/17/2015  . Hypokalemia 04/16/2015  . Migraine with aura 05/21/2014  . 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 Abnormal Pap smear   . Anemia   . Anxiety   . Broken toe 12-22-15   middle toe right foot  . Cataract of both eyes   . Depression   . GERD (gastroesophageal reflux disease)   . Headache(784.0)   . Heart murmur   . History of kidney stones   . 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 Migraine   . Perforated bowel ([place])   . Pernicious anemia 06/19/2015  . PONV (postoperative nausea and vomiting)   . Thyroid disease    hypothyrodism    Past Surgical History:  Procedure Laterality Date  . CATARACT EXTRACTION,  BILATERAL Bilateral 02/2016  . CERVICAL BIOPSY  W/ LOOP ELECTRODE EXCISION  11/2013, 05/2018, 11/2019   LGSIL, negative for dysplasia, LGSIL and positive ECC with potential LGSIL  - respectively  . 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 CESAREAN SECTION  2008  . CHOLECYSTECTOMY    . GASTRIC BYPASS  2002  . HYSTEROSCOPY WITH D & C  10/14/2011   Procedure: DILATATION AND CURETTAGE (D&C) /HYSTEROSCOPY;  Surgeon: Arloa Koh;  Location: [facility];  Service: Gynecology;  Laterality: Bilateral;  . LASIK    . NOSE SURGERY    . STOMACH SURGERY  04/20/2015   dilation of "connection between stomach and intestine", [facility]  . TONSILLECTOMY    . TONSILLECTOMY    . TUBAL LIGATION  2008    Current Outpatient Medications  Medication Sig Dispense Refill  . AIMOVIG 140 MG/ML SOAJ every 30 (thirty) days.    . ALPRAZolam (XANAX) 1 MG tablet Take 1 mg by mouth 2 (two) times daily as needed. Anxiety      . Armodafinil 250 MG tablet Take 250 mg by mouth every morning.  2  . baclofen (LIORESAL) 10 MG tablet Take by mouth.    . botulinum toxin Type A (BOTOX) 100 units SOLR  injection once. Unaware of dose, pt gets q 34mo for migraines    . Botulinum Toxin Type A, Cosm, 100 units SOLR once. Unaware of dose, pt gets q 21mo for migraines    . CONTRAVE 8-90 MG TB12 Take 2 tablets by mouth 2 (two) times daily.    Marland Kitchen DEXILANT 60 MG capsule Take 60 mg by mouth daily.    . diphenhydrAMINE (BENADRYL) 12.5 MG/5ML elixir Take 12.5 mg by mouth every 6 (six) hours as needed.     . divalproex (DEPAKOTE) 500 MG DR tablet Take 1 tablet (500 mg total) by mouth 2 (two) times daily. 180 tablet 4  . doxepin (SINEQUAN) 75 MG capsule 75 mg at bedtime.     Marland Kitchen eletriptan (RELPAX) 40 MG tablet Take 1 tablet (40 mg total) by mouth as needed for migraine or headache. May repeat in 2 hours if needed. Max 2 tabs per day or 8 tabs per month. 10 tablet 12  . ergocalciferol (VITAMIN D2) 50000 UNITS capsule Take  50,000 Units by mouth. She is taking daily M-F - pr pt    . Eszopiclone 3 MG TABS TK 1 T PO QD HS  1  . fexofenadine (ALLEGRA) 180 MG tablet Take 180 mg by mouth daily as needed. allergies     . fluconazole (DIFLUCAN) 150 MG tablet Take 1 tablet by mouth once and may repeat in 72 hours if needed. 2 tablet 0  . fluticasone (FLONASE) 50 MCG/ACT nasal spray Place 1 spray into both nostrils as needed.  1  . gabapentin (NEURONTIN) 300 MG capsule Take one pill at night for 1 week.  Increase to 2 pills at night for 1 week.  Increase to 3 pills at night until seen    . hydrOXYzine (ATARAX/VISTARIL) 25 MG tablet TAKE 1 TABLET BY MOUTH AS NEEDED FOR HEADACHE UPTO EVERY 8 HOURS  2  . imipramine (TOFRANIL) 50 MG tablet Take 2 tablets by mouth daily.  0  . lansoprazole (PREVACID) 30 MG capsule Take 30 mg by mouth daily as needed. Acid reflux      . Lasmiditan Succinate 100 MG TABS Take by mouth.    . levothyroxine (SYNTHROID) 50 MCG tablet TAKE 1 TABLET BY MOUTH EVERY DAY 30 MINUTES PRIOR TO EATING 30 tablet 7  . LINZESS 145 MCG CAPS capsule Take 145 mcg by mouth daily.  3  . Multiple Vitamins-Minerals (MULTIVITAMIN WITH MINERALS) tablet Take 1 tablet by mouth daily.      Marland Kitchen nystatin (NYSTATIN) powder Apply topically 2 (two) times daily. (Patient taking differently: Apply topically 2 (two) times daily as needed. ) 45 g 1  . ondansetron (ZOFRAN) 4 MG tablet Take 1 tablet by mouth as needed.    Marland Kitchen OZEMPIC, 0.25 OR 0.5 MG/DOSE, 2 MG/1.5ML SOPN SMARTSIG:0.25 Milligram(s) SUB-Q Once a Week    . potassium chloride SA (K-DUR) 20 MEQ tablet Take 1 tablet (20 mEq total) by mouth daily for 10 days. 10 tablet 0  . promethazine (PHENERGAN) 12.5 MG tablet TAKE 1 TABLET(12.5 MG) BY MOUTH EVERY 8 HOURS AS NEEDED FOR NAUSEA OR VOMITING 15 tablet 0  . ranitidine (ZANTAC) 150 MG capsule     . Semaglutide,0.25 or 0.5MG /DOS, 2 MG/1.5ML SOPN Inject into the skin.    Marland Kitchen thiamine (VITAMIN B-1) 50 MG tablet daily.  0  . tiZANidine  (ZANAFLEX) 4 MG capsule Take 2-4 mg by mouth at bedtime.  0  . UBRELVY 50 MG TABS daily.    Marland Kitchen venlafaxine XR (EFFEXOR-XR)  150 MG 24 hr capsule Take 1 capsule by mouth daily.  0  . WELLBUTRIN XL 300 MG 24 hr tablet TK 1 T PO  D QAM  1  . zonisamide (ZONEGRAN) 100 MG capsule Take 1 capsule (100 mg total) by mouth 2 (two) times daily. 180 capsule 4   No current facility-administered medications for this visit.     ALLERGIES: Naproxen, Monistat [miconazole], Sulfa antibiotics, and Tioconazole  Family History  Problem Relation Age of Onset  . Diabetes Mother   . Heart disease Father   . Social phobia Father   . Psychiatric Illness Father   . Hypertension Father   . Stroke Father   . Dementia Father   . Heart disease Other   . Diabetes Other   . Stroke Other   . Hypertension Maternal Grandmother   . Thyroid disease Maternal Grandmother   . Hypertension Paternal Grandmother   . Stroke Paternal Grandfather   . Breast cancer Neg Hx     Social History   Socioeconomic History  . Marital status: Divorced    Spouse name: Not on file  . Number of children: 1  . Years of education: College  . Highest education level: Not on file  Occupational History    Employer: [employer]  Tobacco Use  . Smoking status: Never Smoker  . Smokeless tobacco: Never Used  Substance and Sexual Activity  . Alcohol use: Yes    Alcohol/week: 0.0 standard drinks    Comment: Once a month-rarely  . Drug use: No  . Sexual activity: Yes    Partners: Male    Birth control/protection: Surgical    Comment: tubal  Other Topics Concern  . Not on file  Social History Narrative   Pt lives at home with her family.   Caffeine Use: once daily   Social Determinants of Health   Financial Resource Strain:   . Difficulty of Paying Living Expenses: Not on file  Food Insecurity:   . Worried About Charity fundraiser in the Last Year: Not on file  . Ran Out of Food in the Last Year: Not on file   Transportation Needs:   . Lack of Transportation (Medical): Not on file  . Lack of Transportation (Non-Medical): Not on file  Physical Activity:   . Days of Exercise per Week: Not on file  . Minutes of Exercise per Session: Not on file  Stress:   . Feeling of Stress : Not on file  Social Connections:   . Frequency of Communication with Friends and Family: Not on file  . Frequency of Social Gatherings with Friends and Family: Not on file  . Attends Religious Services: Not on file  . Active Member of Clubs or Organizations: Not on file  . Attends Archivist Meetings: Not on file  . Marital Status: Not on file  Intimate Partner Violence:   . Fear of Current or Ex-Partner: Not on file  . Emotionally Abused: Not on file  . Physically Abused: Not on file  . Sexually Abused: Not on file    Review of Systems  PHYSICAL EXAMINATION:    There were no vitals taken for this visit.    General appearance: alert, cooperative and appears stated age Head: Normocephalic, without obvious abnormality, atraumatic Neck: no adenopathy, supple, symmetrical, trachea midline and thyroid normal to inspection and palpation Lungs: clear to auscultation bilaterally Breasts: normal appearance, no masses or tenderness, No nipple retraction or dimpling, No nipple discharge or bleeding,  No axillary or supraclavicular adenopathy Heart: regular rate and rhythm Abdomen: soft, non-tender, no masses,  no organomegaly Extremities: extremities normal, atraumatic, no cyanosis or edema Skin: Skin color, texture, turgor normal. No rashes or lesions Lymph nodes: Cervical, supraclavicular, and axillary nodes normal. No abnormal inguinal nodes palpated Neurologic: Grossly normal  Pelvic: External genitalia:  no lesions              Urethra:  normal appearing urethra with no masses, tenderness or lesions              Bartholins and Skenes: normal                 Vagina: normal appearing vagina with normal  color and discharge, no lesions              Cervix: no lesions                Bimanual Exam:  Uterus:  normal size, contour, position, consistency, mobility, non-tender              Adnexa: no mass, fullness, tenderness              Rectal exam: {yes no:[id number]}.  Confirms.              Anus:  normal sphincter tone, no lesions  Chaperone was present for exam.  ASSESSMENT     PLAN     An After Visit Summary was printed and given to the patient.  ______ minutes face to face time of which over 50% was spent in counseling.

## 2019-12-25 ENCOUNTER — Ambulatory Visit (INDEPENDENT_AMBULATORY_CARE_PROVIDER_SITE_OTHER): Payer: 59 | Admitting: Obstetrics and Gynecology

## 2019-12-25 ENCOUNTER — Other Ambulatory Visit: Payer: Self-pay

## 2019-12-25 ENCOUNTER — Encounter: Payer: Self-pay | Admitting: Obstetrics and Gynecology

## 2019-12-25 VITALS — BP 110/72 | HR 70 | Temp 96.3°F | Ht 63.0 in | Wt 205.6 lb

## 2019-12-25 DIAGNOSIS — N393 Stress incontinence (female) (male): Secondary | ICD-10-CM

## 2019-12-25 DIAGNOSIS — N870 Mild cervical dysplasia: Secondary | ICD-10-CM

## 2019-12-25 NOTE — Progress Notes (Signed)
GYNECOLOGY  VISIT   HPI: 54 y.o.   Divorced  Caucasian  female   G1P1 with No LMP recorded. (Menstrual status: Perimenopausal).   here for 4 week follow up after LEEP procedure.     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  The LEEP was performed for pap ASCUS with positive HR HPV, colposcopy showing LGSIL and unsatisfactory colposcopy.   She has had several prior LEEPs.  She has recurrent positive HR HPV.  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  No discharge or bleeding since the procedure.   She has occasional leakage of urine with a cough or sneeze.  She declined to do pelvic floor therapy.   GYNECOLOGIC HISTORY: No LMP recorded. (Menstrual status: Perimenopausal). Contraception: Tubal Menopausal hormone therapy: None Last mammogram: 10-19-17 Neg/density B/BiRads1 Last pap smear: 10-22-19 ASCUS:Pos HR HPV;colpo 11-06-19 revealing LGSIL--not able to assess endocervix.04-22-19 Neg:Neg HR HPV, 09-25-18 Neg:Neg HR HPV,05/08/18 LEEP showed normal cells, 04/27/18 Colpo showed LGSIL, 04-17-18 LGSIL:Pos HR HPV,02-17-17 Neg:Neg HR HPV, 11-20-15 Neg:Neg HR HPV        OB History    Gravida  1   Para  1   Term      Preterm      AB      Living  1     SAB      TAB      Ectopic      Multiple  1   Live Births                 Patient Active Problem List   Diagnosis Date Noted  . Vitamin D deficiency 05/23/2017  . Anxiety 06/19/2015  . Anemia, iron deficiency 06/19/2015  . Pernicious anemia 06/19/2015  . 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 Protein-calorie malnutrition, severe ([place]) 04/18/2015  . Severe protein-calorie malnutrition ([place]) 04/18/2015  . 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 Bariatric surgery status 04/18/2015  . 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 GERD (gastroesophageal reflux disease) 04/17/2015  . Hypokalemia 04/16/2015  . Migraine  with aura 05/21/2014  . 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 Abnormal Pap smear   . Anemia   . Anxiety   . Broken toe 12-22-15   middle toe right foot  . Cataract of both eyes   . Depression   . GERD (gastroesophageal reflux disease)   . Headache(784.0)   . Heart murmur   . History of kidney stones   . 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 Migraine   . Perforated bowel ([place])   . Pernicious anemia 06/19/2015  . PONV (postoperative nausea and vomiting)   . Thyroid disease    hypothyrodism    Past Surgical History:  Procedure Laterality Date  . CATARACT EXTRACTION, BILATERAL Bilateral 02/2016  . CERVICAL BIOPSY  W/ LOOP ELECTRODE EXCISION  11/2013, 05/2018, 11/2019   LGSIL, negative for dysplasia, LGSIL and positive ECC with potential LGSIL  - respectively  . 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 CESAREAN SECTION  2008  . CHOLECYSTECTOMY    . GASTRIC BYPASS  2002  . HYSTEROSCOPY WITH D & C  10/14/2011   Procedure: DILATATION AND CURETTAGE (D&C) /HYSTEROSCOPY;  Surgeon: Arloa Koh;  Location: [facility];  Service: Gynecology;  Laterality: Bilateral;  . LASIK    . NOSE SURGERY    . STOMACH  SURGERY  04/20/2015   dilation of "connection between stomach and intestine", [facility]  . TONSILLECTOMY    . TONSILLECTOMY    . TUBAL LIGATION  2008    Current Outpatient Medications  Medication Sig Dispense Refill  . AIMOVIG 140 MG/ML SOAJ every 30 (thirty) days.    . ALPRAZolam (XANAX) 1 MG tablet Take 1 mg by mouth 2 (two) times daily as needed. Anxiety      . Armodafinil 250 MG tablet Take 250 mg by mouth every morning.  2  . baclofen (LIORESAL) 10 MG tablet Take by mouth.    . botulinum toxin Type A (BOTOX) 100 units SOLR injection once. Unaware of dose, pt gets q 13mo for migraines    . Botulinum Toxin Type A, Cosm, 100 units SOLR once. Unaware of dose, pt  gets q 34mo for migraines    . CONTRAVE 8-90 MG TB12 Take 2 tablets by mouth 2 (two) times daily.    Marland Kitchen DEXILANT 60 MG capsule Take 60 mg by mouth daily.    . diphenhydrAMINE (BENADRYL) 12.5 MG/5ML elixir Take 12.5 mg by mouth every 6 (six) hours as needed.     . divalproex (DEPAKOTE) 500 MG DR tablet Take 1 tablet (500 mg total) by mouth 2 (two) times daily. 180 tablet 4  . doxepin (SINEQUAN) 75 MG capsule 75 mg at bedtime.     Marland Kitchen eletriptan (RELPAX) 40 MG tablet Take 1 tablet (40 mg total) by mouth as needed for migraine or headache. May repeat in 2 hours if needed. Max 2 tabs per day or 8 tabs per month. 10 tablet 12  . ergocalciferol (VITAMIN D2) 50000 UNITS capsule Take 50,000 Units by mouth. She is taking daily M-F - pr pt    . Eszopiclone 3 MG TABS TK 1 T PO QD HS  1  . fexofenadine (ALLEGRA) 180 MG tablet Take 180 mg by mouth daily as needed. allergies     . fluconazole (DIFLUCAN) 150 MG tablet Take 1 tablet by mouth once and may repeat in 72 hours if needed. 2 tablet 0  . fluticasone (FLONASE) 50 MCG/ACT nasal spray Place 1 spray into both nostrils as needed.  1  . gabapentin (NEURONTIN) 300 MG capsule Take one pill at night for 1 week.  Increase to 2 pills at night for 1 week.  Increase to 3 pills at night until seen    . hydrOXYzine (ATARAX/VISTARIL) 25 MG tablet TAKE 1 TABLET BY MOUTH AS NEEDED FOR HEADACHE UPTO EVERY 8 HOURS  2  . imipramine (TOFRANIL) 50 MG tablet Take 2 tablets by mouth daily.  0  . lansoprazole (PREVACID) 30 MG capsule Take 30 mg by mouth daily as needed. Acid reflux      . Lasmiditan Succinate 100 MG TABS Take by mouth.    . levothyroxine (SYNTHROID) 50 MCG tablet TAKE 1 TABLET BY MOUTH EVERY DAY 30 MINUTES PRIOR TO EATING 30 tablet 7  . LINZESS 145 MCG CAPS capsule Take 145 mcg by mouth daily.  3  . Multiple Vitamins-Minerals (MULTIVITAMIN WITH MINERALS) tablet Take 1 tablet by mouth daily.      Marland Kitchen nystatin (NYSTATIN) powder Apply topically 2 (two) times daily.  (Patient taking differently: Apply topically 2 (two) times daily as needed. ) 45 g 1  . ondansetron (ZOFRAN) 4 MG tablet Take 1 tablet by mouth as needed.    Marland Kitchen OZEMPIC, 0.25 OR 0.5 MG/DOSE, 2 MG/1.5ML SOPN SMARTSIG:0.25 Milligram(s) SUB-Q Once a Week    .  promethazine (PHENERGAN) 12.5 MG tablet TAKE 1 TABLET(12.5 MG) BY MOUTH EVERY 8 HOURS AS NEEDED FOR NAUSEA OR VOMITING 15 tablet 0  . ranitidine (ZANTAC) 150 MG capsule     . Semaglutide,0.25 or 0.5MG /DOS, 2 MG/1.5ML SOPN Inject into the skin.    Marland Kitchen thiamine (VITAMIN B-1) 50 MG tablet daily.  0  . tiZANidine (ZANAFLEX) 4 MG capsule Take 2-4 mg by mouth at bedtime.  0  . UBRELVY 50 MG TABS daily.    Marland Kitchen venlafaxine XR (EFFEXOR-XR) 150 MG 24 hr capsule Take 1 capsule by mouth daily.  0  . WELLBUTRIN XL 300 MG 24 hr tablet TK 1 T PO  D QAM  1  . zonisamide (ZONEGRAN) 100 MG capsule Take 1 capsule (100 mg total) by mouth 2 (two) times daily. 180 capsule 4  . potassium chloride SA (K-DUR) 20 MEQ tablet Take 1 tablet (20 mEq total) by mouth daily for 10 days. 10 tablet 0   No current facility-administered medications for this visit.     ALLERGIES: Naproxen, Monistat [miconazole], Sulfa antibiotics, and Tioconazole  Family History  Problem Relation Age of Onset  . Diabetes Mother   . Heart disease Father   . Social phobia Father   . Psychiatric Illness Father   . Hypertension Father   . Stroke Father   . Dementia Father   . Heart disease Other   . Diabetes Other   . Stroke Other   . Hypertension Maternal Grandmother   . Thyroid disease Maternal Grandmother   . Hypertension Paternal Grandmother   . Stroke Paternal Grandfather   . Breast cancer Neg Hx     Social History   Socioeconomic History  . Marital status: Divorced    Spouse name: Not on file  . Number of children: 1  . Years of education: College  . Highest education level: Not on file  Occupational History    Employer: [employer]  Tobacco Use  . Smoking status:  Never Smoker  . Smokeless tobacco: Never Used  Substance and Sexual Activity  . Alcohol use: Yes    Alcohol/week: 0.0 standard drinks    Comment: Once a month-rarely  . Drug use: No  . Sexual activity: Yes    Partners: Male    Birth control/protection: Surgical    Comment: tubal  Other Topics Concern  . Not on file  Social History Narrative   Pt lives at home with her family.   Caffeine Use: once daily   Social Determinants of Health   Financial Resource Strain:   . Difficulty of Paying Living Expenses: Not on file  Food Insecurity:   . Worried About Charity fundraiser in the Last Year: Not on file  . Ran Out of Food in the Last Year: Not on file  Transportation Needs:   . Lack of Transportation (Medical): Not on file  . Lack of Transportation (Non-Medical): Not on file  Physical Activity:   . Days of Exercise per Week: Not on file  . Minutes of Exercise per Session: Not on file  Stress:   . Feeling of Stress : Not on file  Social Connections:   . Frequency of Communication with Friends and Family: Not on file  . Frequency of Social Gatherings with Friends and Family: Not on file  . Attends Religious Services: Not on file  . Active Member of Clubs or Organizations: Not on file  . Attends Archivist Meetings: Not on file  . Marital Status:  Not on file  Intimate Partner Violence:   . Fear of Current or Ex-Partner: Not on file  . Emotionally Abused: Not on file  . Physically Abused: Not on file  . Sexually Abused: Not on file    Review of Systems  All other systems reviewed and are negative.   PHYSICAL EXAMINATION:    BP 110/72 (Cuff Size: Large)   Pulse 70   Temp (!) 96.3 F (35.7 C) (Temporal)   Ht 5\' 3"  (1.6 m)   Wt 205 lb 9.6 oz (93.3 kg)   BMI 36.42 kg/m     General appearance: alert, cooperative and appears stated age  Pelvic: External genitalia:  no lesions              Urethra:  normal appearing urethra with no masses, tenderness or  lesions              Bartholins and Skenes: normal                 Vagina: normal appearing vagina with normal color and discharge, no lesions              Cervix: no lesions.  Almost completely healed cervix.  She has more cervix anteriorly than posteriorly.                 Bimanual Exam:  Uterus:  normal size, contour, position, consistency, mobility, non-tender              Adnexa: no mass, fullness, tenderness           Chaperone was present for exam.  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  Recent LEEP with CIN I and negative margins but ECC showing atypia of squamous cells.  Status post multiple LEEPs.  Mild stress incontinence.  Status post Cesarean Section.  Status post cholecystectomy.  Status post gastric bypass.  Status post laparoscopic revision of gastrojejunal anastomosis with reconstruction and enterolysis 04/20/15.  PLAN  We discussed options for continue management with pap and HR HPV testing versus total laparoscopic hysterectomy with bilateral salpingectomy and cystoscopy.  I discussed total laparoscopic hysterectomy with bilateral salpingectomy and, cystoscopy.    I reviewed risks, benefits, and alternatives.  Risks include but are not limited to bleeding, infection, damage to surrounding organs, pneumonia, reaction to anesthesia, DVT, PE, death, need for reoperation, hernia formation, vaginal cuff dehiscence, neuropathy, need to convert to a traditional laparotomy incision to complete the procedure.  She understands that hysterectomy will not remove HR HPV from her body and that she may still have abnormal vaginal dysplasia and need further medical or surgical treatment of this.  Patient wishes to proceed. Questions invited and answered.  Will proceed with urodynamic testing to create a final plan for her urinary incontinence.   ACOG HO on both hysterectomy and surgery for urinary incontinence to patient.   Will get records from her  endocrinologist, Dr. Soyla Murphy regarding her current hormonal status and care.   An After Visit Summary was printed and given to the patient.  _30_____ minutes face to face time of which over 50% was spent in counseling.

## 2020-01-07 ENCOUNTER — Ambulatory Visit: Payer: 59

## 2020-01-07 ENCOUNTER — Telehealth: Payer: Self-pay | Admitting: Family

## 2020-01-07 NOTE — Telephone Encounter (Signed)
Rescheduled per 2/2 shc msg, pt req. Called and spoke with pt, confirmed 2/5 appt

## 2020-01-10 ENCOUNTER — Inpatient Hospital Stay: Payer: 59 | Attending: Hematology & Oncology

## 2020-01-10 ENCOUNTER — Ambulatory Visit
Admission: RE | Admit: 2020-01-10 | Discharge: 2020-01-10 | Disposition: A | Discharge status: Home / Self Care | Payer: 59 | Source: Ambulatory Visit | Attending: Family Medicine | Admitting: Family Medicine

## 2020-01-10 ENCOUNTER — Other Ambulatory Visit: Payer: Self-pay | Admitting: Family Medicine

## 2020-01-10 ENCOUNTER — Other Ambulatory Visit: Payer: Self-pay

## 2020-01-10 VITALS — BP 114/84 | HR 80 | Temp 98.2°F | Resp 18

## 2020-01-10 DIAGNOSIS — K912 Postsurgical malabsorption, not elsewhere classified: Secondary | ICD-10-CM

## 2020-01-10 DIAGNOSIS — D510 Vitamin B12 deficiency anemia due to intrinsic factor deficiency: Secondary | ICD-10-CM | POA: Diagnosis present

## 2020-01-10 DIAGNOSIS — R112 Nausea with vomiting, unspecified: Secondary | ICD-10-CM

## 2020-01-10 MED ORDER — CYANOCOBALAMIN 1000 MCG/ML IJ SOLN
INTRAMUSCULAR | Status: AC
  Filled 2020-01-10: qty 1

## 2020-01-10 MED ORDER — CYANOCOBALAMIN 1000 MCG/ML IJ SOLN
1000.0000 ug | Freq: Once | INTRAMUSCULAR | Status: AC
  Administered 2020-01-10: 1000 ug via INTRAMUSCULAR

## 2020-01-10 NOTE — Patient Instructions (Signed)

## 2020-01-28 ENCOUNTER — Ambulatory Visit: Payer: 59

## 2020-01-29 ENCOUNTER — Telehealth: Payer: Self-pay | Admitting: Hematology & Oncology

## 2020-01-29 NOTE — Telephone Encounter (Signed)
Rescheduled per 2/23 sch msg, pt req. Called and left a msg.

## 2020-01-30 ENCOUNTER — Inpatient Hospital Stay: Payer: 59

## 2020-01-30 ENCOUNTER — Other Ambulatory Visit: Payer: Self-pay

## 2020-01-30 VITALS — BP 127/89 | HR 89 | Temp 98.7°F | Resp 18

## 2020-01-30 DIAGNOSIS — D510 Vitamin B12 deficiency anemia due to intrinsic factor deficiency: Secondary | ICD-10-CM | POA: Diagnosis not present

## 2020-01-30 DIAGNOSIS — K912 Postsurgical malabsorption, not elsewhere classified: Secondary | ICD-10-CM

## 2020-01-30 MED ORDER — CYANOCOBALAMIN 1000 MCG/ML IJ SOLN
1000.0000 ug | Freq: Once | INTRAMUSCULAR | Status: AC
  Administered 2020-01-30: 1000 ug via INTRAMUSCULAR

## 2020-01-30 NOTE — Patient Instructions (Signed)

## 2020-01-31 ENCOUNTER — Ambulatory Visit: Payer: 59

## 2020-02-01 ENCOUNTER — Other Ambulatory Visit: Payer: Self-pay | Admitting: Obstetrics and Gynecology

## 2020-02-01 DIAGNOSIS — R32 Unspecified urinary incontinence: Secondary | ICD-10-CM

## 2020-02-18 ENCOUNTER — Ambulatory Visit: Payer: 59

## 2020-03-10 ENCOUNTER — Inpatient Hospital Stay: Payer: 59 | Attending: Hematology & Oncology

## 2020-03-10 ENCOUNTER — Telehealth: Payer: Self-pay | Admitting: *Deleted

## 2020-03-10 ENCOUNTER — Other Ambulatory Visit: Payer: Self-pay

## 2020-03-10 DIAGNOSIS — D508 Other iron deficiency anemias: Secondary | ICD-10-CM

## 2020-03-10 DIAGNOSIS — D510 Vitamin B12 deficiency anemia due to intrinsic factor deficiency: Secondary | ICD-10-CM | POA: Insufficient documentation

## 2020-03-10 DIAGNOSIS — D500 Iron deficiency anemia secondary to blood loss (chronic): Secondary | ICD-10-CM | POA: Insufficient documentation

## 2020-03-10 DIAGNOSIS — K912 Postsurgical malabsorption, not elsewhere classified: Secondary | ICD-10-CM

## 2020-03-10 DIAGNOSIS — N920 Excessive and frequent menstruation with regular cycle: Secondary | ICD-10-CM | POA: Diagnosis not present

## 2020-03-10 DIAGNOSIS — R748 Abnormal levels of other serum enzymes: Secondary | ICD-10-CM

## 2020-03-10 LAB — CMP (CANCER CENTER ONLY)
ALT: 7 U/L (ref 0–44)
AST: 11 U/L — ABNORMAL LOW (ref 15–41)
Albumin: 3.5 g/dL (ref 3.5–5.0)
Alkaline Phosphatase: 63 U/L (ref 38–126)
Anion gap: 10 (ref 5–15)
BUN: 10 mg/dL (ref 6–20)
CO2: 26 mmol/L (ref 22–32)
Calcium: 9.1 mg/dL (ref 8.9–10.3)
Chloride: 103 mmol/L (ref 98–111)
Creatinine: 0.85 mg/dL (ref 0.44–1.00)
GFR, Est AFR Am: >60 mL/min (ref >60)
GFR, Estimated: >60 mL/min (ref >60)
Glucose, Bld: 146 mg/dL — ABNORMAL HIGH (ref 70–99)
Potassium: 4 mmol/L (ref 3.5–5.1)
Sodium: 139 mmol/L (ref 135–145)
Total Bilirubin: 0.5 mg/dL (ref 0.3–1.2)
Total Protein: 6.5 g/dL (ref 6.5–8.1)

## 2020-03-10 LAB — CBC WITH DIFFERENTIAL (CANCER CENTER ONLY)
Abs Immature Granulocytes: 0.05 10*3/uL (ref 0.00–0.07)
Basophils Absolute: 0 10*3/uL (ref 0.0–0.1)
Basophils Relative: 1 %
Eosinophils Absolute: 0.1 10*3/uL (ref 0.0–0.5)
Eosinophils Relative: 1 %
HCT: 43.2 % (ref 36.0–46.0)
Hemoglobin: 14.1 g/dL (ref 12.0–15.0)
Immature Granulocytes: 1 %
Lymphocytes Relative: 34 %
Lymphs Abs: 2.1 10*3/uL (ref 0.7–4.0)
MCH: 31.1 pg (ref 26.0–34.0)
MCHC: 32.6 g/dL (ref 30.0–36.0)
MCV: 95.2 fL (ref 80.0–100.0)
Monocytes Absolute: 0.4 10*3/uL (ref 0.1–1.0)
Monocytes Relative: 6 %
Neutro Abs: 3.7 10*3/uL (ref 1.7–7.7)
Neutrophils Relative %: 57 %
Platelet Count: 230 10*3/uL (ref 150–400)
RBC: 4.54 MIL/uL (ref 3.87–5.11)
RDW: 11.9 % (ref 11.5–15.5)
WBC Count: 6.3 10*3/uL (ref 4.0–10.5)
nRBC: 0 % (ref 0.0–0.2)

## 2020-03-10 LAB — FERRITIN: Ferritin: 169 ng/mL (ref 11–307)

## 2020-03-10 LAB — IRON AND TIBC
Iron: 76 ug/dL (ref 41–142)
Saturation Ratios: 26 % (ref 21–57)
TIBC: 290 ug/dL (ref 236–444)
UIBC: 214 ug/dL (ref 120–384)

## 2020-03-10 LAB — VITAMIN B12: Vitamin B-12: 299 pg/mL (ref 180–914)

## 2020-03-10 NOTE — Telephone Encounter (Signed)
Message received from patient requesting iron results done this am at [facility]  Call placed back to patient and voicemail full.

## 2020-03-10 NOTE — Telephone Encounter (Signed)
Call received back from patient and patient notified per order of S. [place] NP that iron studies look good, and that she will not need an iron infusion tomorrow after visit with Judson Roch.  Pt appreciative of information and has no further questions at this time.

## 2020-03-11 ENCOUNTER — Inpatient Hospital Stay: Payer: 59

## 2020-03-11 ENCOUNTER — Inpatient Hospital Stay: Payer: 59 | Admitting: Family

## 2020-03-11 ENCOUNTER — Encounter: Payer: Self-pay | Admitting: Family

## 2020-03-11 VITALS — BP 147/80 | HR 82 | Temp 97.7°F | Resp 18 | Ht 63.5 in | Wt 201.8 lb

## 2020-03-11 DIAGNOSIS — D510 Vitamin B12 deficiency anemia due to intrinsic factor deficiency: Secondary | ICD-10-CM

## 2020-03-11 DIAGNOSIS — D508 Other iron deficiency anemias: Secondary | ICD-10-CM | POA: Diagnosis not present

## 2020-03-11 DIAGNOSIS — Z903 Acquired absence of stomach [part of]: Secondary | ICD-10-CM

## 2020-03-11 DIAGNOSIS — K912 Postsurgical malabsorption, not elsewhere classified: Secondary | ICD-10-CM

## 2020-03-11 MED ORDER — CYANOCOBALAMIN 1000 MCG/ML IJ SOLN
INTRAMUSCULAR | Status: AC
  Filled 2020-03-11: qty 1

## 2020-03-11 MED ORDER — CYANOCOBALAMIN 1000 MCG/ML IJ SOLN
1000.0000 ug | Freq: Once | INTRAMUSCULAR | Status: AC
  Administered 2020-03-11: 1000 ug via INTRAMUSCULAR

## 2020-03-11 NOTE — Patient Instructions (Signed)

## 2020-03-11 NOTE — Progress Notes (Signed)
Hematology and Oncology Follow Up Visit  KECIA BAWDEN [id number] [birth year] 54 y.o. 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 Schmeiser is here today for follow-up and B 12 injection. Her iron studies are stable, no infusion needed at this time.  She is doing well but still has issues with intermittent fatigue.  She is seeing an endocrinologist for her weight gain and is trying to figure out what she needs to do next.  She states that she eats healthy and hydrates properly. Her weight is stable.  She no longer has a cycle.  No episodes of bleeding. No bruising or petechiae.  No fever, chills, n/v, cough, rash, SOB, chest pain, palpitations, abdominal pain or changes in bowel or bladder habits.  She has occasional dizziness and will take a break to rest if needed.  No swelling, tenderness, numbness or tingling in her extremities at this time.  No falls or syncopal episodes to report.   ECOG Performance Status: 1 - Symptomatic but completely ambulatory  Medications:  Allergies as of 03/11/2020      Reactions   Naproxen Anaphylaxis   Monistat [miconazole] Other (See Comments)   "burning"   Sulfa Antibiotics Hives   Tioconazole Itching      Medication List       Accurate as of March 11, 2020  1:16 PM. If you have any questions, ask your nurse or doctor.        Aimovig 140 MG/ML Soaj Generic drug: Erenumab-aooe every 30 (thirty) days.   ALPRAZolam 1 MG tablet Commonly known as: XANAX Take 1 mg by mouth 2 (two) times daily as needed. Anxiety   Armodafinil 250 MG tablet Take 250 mg by mouth every morning.   baclofen 10 MG tablet Commonly known as: LIORESAL Take by mouth.   Botulinum Toxin Type A (Cosm) 100 units Solr once. Unaware of dose, pt  gets q 50mo for migraines   botulinum toxin Type A 100 units Solr injection Commonly known as: BOTOX once. Unaware of dose, pt gets q 67mo for migraines   Contrave 8-90 MG Tb12 Generic drug: Naltrexone-buPROPion HCl ER Take 2 tablets by mouth 2 (two) times daily.   Dexilant 60 MG capsule Generic drug: dexlansoprazole Take 60 mg by mouth daily.   diphenhydrAMINE 12.5 MG/5ML elixir Commonly known as: BENADRYL Take 12.5 mg by mouth every 6 (six) hours as needed.   divalproex 500 MG DR tablet Commonly known as: DEPAKOTE Take 1 tablet (500 mg total) by mouth 2 (two) times daily.   doxepin 75 MG capsule Commonly known as: SINEQUAN 75 mg at bedtime.   eletriptan 40 MG tablet Commonly known as: RELPAX Take 1 tablet (40 mg total) by mouth as needed for migraine or headache. May repeat in 2 hours if needed. Max 2 tabs per day or 8 tabs per month.   ergocalciferol 1.25 MG (50000 UT) capsule Commonly known as: VITAMIN D2 Take 50,000 Units by mouth. She is taking daily M-F - pr pt   Eszopiclone 3 MG Tabs TK 1 T PO QD HS   fexofenadine 180 MG tablet Commonly known as: ALLEGRA Take 180 mg by mouth daily as needed. allergies   fluconazole 150 MG tablet Commonly known as: DIFLUCAN Take 1 tablet by mouth once  and may repeat in 72 hours if needed.   fluticasone 50 MCG/ACT nasal spray Commonly known as: FLONASE Place 1 spray into both nostrils as needed.   gabapentin 300 MG capsule Commonly known as: NEURONTIN Take one pill at night for 1 week.  Increase to 2 pills at night for 1 week.  Increase to 3 pills at night until seen   hydrOXYzine 25 MG tablet Commonly known as: ATARAX/VISTARIL TAKE 1 TABLET BY MOUTH AS NEEDED FOR HEADACHE UPTO EVERY 8 HOURS   imipramine 50 MG tablet Commonly known as: TOFRANIL Take 2 tablets by mouth daily.   lansoprazole 30 MG capsule Commonly known as: PREVACID Take 30 mg by mouth daily as needed. Acid reflux   Lasmiditan Succinate 100 MG  Tabs Take by mouth.   levothyroxine 50 MCG tablet Commonly known as: SYNTHROID TAKE 1 TABLET BY MOUTH EVERY DAY 30 MINUTES PRIOR TO EATING   Linzess 145 MCG Caps capsule Generic drug: linaclotide Take 145 mcg by mouth daily.   multivitamin with minerals tablet Take 1 tablet by mouth daily.   nystatin powder Commonly known as: nystatin Apply topically 2 (two) times daily. What changed:   when to take this  reasons to take this   ondansetron 4 MG tablet Commonly known as: ZOFRAN Take 1 tablet by mouth as needed.   Ozempic (0.25 or 0.5 MG/DOSE) 2 MG/1.5ML Sopn Generic drug: Semaglutide(0.25 or 0.5MG /DOS) SMARTSIG:0.25 Milligram(s) SUB-Q Once a Week   Semaglutide(0.25 or 0.5MG /DOS) 2 MG/1.5ML Sopn Inject into the skin.   potassium chloride SA 20 MEQ tablet Commonly known as: KLOR-CON Take 1 tablet (20 mEq total) by mouth daily for 10 days.   promethazine 12.5 MG tablet Commonly known as: PHENERGAN TAKE 1 TABLET(12.5 MG) BY MOUTH EVERY 8 HOURS AS NEEDED FOR NAUSEA OR VOMITING   ranitidine 150 MG capsule Commonly known as: ZANTAC   thiamine 50 MG tablet Commonly known as: VITAMIN B-1 daily.   tiZANidine 4 MG capsule Commonly known as: ZANAFLEX Take 2-4 mg by mouth at bedtime.   Ubrelvy 50 MG Tabs Generic drug: Ubrogepant daily.   venlafaxine XR 150 MG 24 hr capsule Commonly known as: EFFEXOR-XR Take 1 capsule by mouth daily.   Wellbutrin XL 300 MG 24 hr tablet Generic drug: buPROPion TK 1 T PO  D QAM   zonisamide 100 MG capsule Commonly known as: ZONEGRAN Take 1 capsule (100 mg total) by mouth 2 (two) times daily.       Allergies:  Allergies  Allergen Reactions  . Naproxen Anaphylaxis  . Monistat [Miconazole] Other (See Comments)    "burning"   . Sulfa Antibiotics Hives  . Tioconazole Itching    Past Medical History, Surgical history, Social history, and Family History were reviewed and updated.  Review of Systems: All other 10 point  review of systems is negative.   Physical Exam:  vitals were not taken for this visit.   Wt Readings from Last 3 Encounters:  12/25/19 205 lb 9.6 oz (93.3 kg)  12/17/19 202 lb 0.6 oz (91.6 kg)  11/18/19 199 lb 9.6 oz (90.5 kg)    Ocular: Sclerae unicteric, pupils equal, round and reactive to light Ear-nose-throat: Oropharynx clear, dentition fair Lymphatic: No cervical or supraclavicular adenopathy Lungs no rales or rhonchi, good excursion bilaterally Heart regular rate and rhythm, no murmur appreciated Abd soft, nontender, positive bowel sounds, no liver or spleen tip palpated on exam, no fluid wave  MSK no focal spinal tenderness, no joint edema Neuro: non-focal, well-oriented,  appropriate affect Breasts: Deferred   Lab Results  Component Value Date   WBC 6.3 03/10/2020   HGB 14.1 03/10/2020   HCT 43.2 03/10/2020   MCV 95.2 03/10/2020   PLT 230 03/10/2020   Lab Results  Component Value Date   FERRITIN 169 03/10/2020   IRON 76 03/10/2020   TIBC 290 03/10/2020   UIBC 214 03/10/2020   IRONPCTSAT 26 03/10/2020   Lab Results  Component Value Date   RETICCTPCT 1.1 07/03/2019   RBC 4.54 03/10/2020   RETICCTABS 49.62 09/07/2016   No results found for: KPAFRELGTCHN, LAMBDASER, KAPLAMBRATIO No results found for: IGGSERUM, IGA, IGMSERUM No results found for: Odetta Pink, SPEI   Chemistry      Component Value Date/Time   NA 139 03/10/2020 1240   NA 137 10/18/2017 1630   NA 140 09/07/2016 1327   K 4.0 03/10/2020 1240   K 3.4 (L) 09/07/2016 1327   CL 103 03/10/2020 1240   CO2 26 03/10/2020 1240   CO2 22 09/07/2016 1327   BUN 10 03/10/2020 1240   BUN 18 10/18/2017 1630   BUN 13.3 09/07/2016 1327   CREATININE 0.85 03/10/2020 1240   CREATININE 0.70 12/29/2016 1652   CREATININE 0.8 09/07/2016 1327      Component Value Date/Time   CALCIUM 9.1 03/10/2020 1240   CALCIUM 8.2 (L) 09/07/2016 1327   ALKPHOS 63  03/10/2020 1240   ALKPHOS 54 09/07/2016 1327   AST 11 (L) 03/10/2020 1240   AST 11 09/07/2016 1327   ALT 7 03/10/2020 1240   ALT <9 09/07/2016 1327   BILITOT 0.5 03/10/2020 1240   BILITOT 0.41 09/07/2016 1327       Impression and Plan: Ms. Meckstroth is a very pleasant 54 yo caucasian female with both B 12 and iron deficiency 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 Iron studies look good. No infusion needed this visit.  She did get B 12 while here today.  She will stay on her every 3 week injection scheduled with follow-up in 3 weeks.  She will contact our office with any questions or concerns. We can certainly see her sooner if needed.   Laverna Peace, NP 4/7/20211:16 PM

## 2020-03-31 ENCOUNTER — Other Ambulatory Visit: Payer: Self-pay | Admitting: Family Medicine

## 2020-03-31 DIAGNOSIS — Z1231 Encounter for screening mammogram for malignant neoplasm of breast: Secondary | ICD-10-CM

## 2020-04-01 ENCOUNTER — Inpatient Hospital Stay: Payer: 59

## 2020-04-01 ENCOUNTER — Ambulatory Visit
Admission: RE | Admit: 2020-04-01 | Discharge: 2020-04-01 | Disposition: A | Discharge status: Home / Self Care | Payer: 59 | Source: Ambulatory Visit | Attending: Family Medicine | Admitting: Family Medicine

## 2020-04-01 ENCOUNTER — Other Ambulatory Visit: Payer: Self-pay | Admitting: Obstetrics and Gynecology

## 2020-04-01 ENCOUNTER — Other Ambulatory Visit: Payer: Self-pay

## 2020-04-01 DIAGNOSIS — Z1231 Encounter for screening mammogram for malignant neoplasm of breast: Secondary | ICD-10-CM

## 2020-04-02 ENCOUNTER — Inpatient Hospital Stay: Payer: 59

## 2020-04-02 ENCOUNTER — Other Ambulatory Visit: Payer: Self-pay

## 2020-04-02 VITALS — BP 118/93 | HR 97 | Temp 99.1°F | Resp 20

## 2020-04-02 DIAGNOSIS — D510 Vitamin B12 deficiency anemia due to intrinsic factor deficiency: Secondary | ICD-10-CM

## 2020-04-02 DIAGNOSIS — Z903 Acquired absence of stomach [part of]: Secondary | ICD-10-CM

## 2020-04-02 DIAGNOSIS — K912 Postsurgical malabsorption, not elsewhere classified: Secondary | ICD-10-CM

## 2020-04-02 MED ORDER — CYANOCOBALAMIN 1000 MCG/ML IJ SOLN
1000.0000 ug | Freq: Once | INTRAMUSCULAR | Status: AC
  Administered 2020-04-02: 1000 ug via INTRAMUSCULAR

## 2020-04-02 NOTE — Patient Instructions (Signed)
Cyanocobalamin, Pyridoxine, and Folate What is this medicine? A multivitamin containing folic acid, vitamin B6, and vitamin B12. This medicine may be used for other purposes; ask your health care provider or pharmacist if you have questions. COMMON BRAND NAME(S): AllanFol RX, AllanTex, Av-Vite FB, B Complex with Folic Acid, ComBgen, FaBB, Folamin, Folastin, Folbalin, Folbee, Folbic, Folcaps, Folgard, Folgard RX, Folgard RX 2.2, Folplex, Folplex 2.2, Foltabs 800, Foltx, Homocysteine Formula, Niva-Fol, NuFol, TL Gard RX, Virt-Gard, Virt-Vite, Virt-Vite Forte, Vita-Respa What should I tell my health care provider before I take this medicine? They need to know if you have any of these conditions:  bleeding or clotting disorder  history of anemia of any type  other chronic health condition  an unusual or allergic reaction to vitamins, other medicines, foods, dyes, or preservatives  pregnant or trying to get pregnant  breast-feeding How should I use this medicine? Take by mouth with a glass of water. May take with food. Follow the directions on the prescription label. It is usually given once a day. Do not take your medicine more often than directed. Contact your pediatrician regarding the use of this medicine in children. Special care may be needed. Overdosage: If you think you have taken too much of this medicine contact a poison control center or emergency room at once. NOTE: This medicine is only for you. Do not share this medicine with others. What if I miss a dose? If you miss a dose, take it as soon as you can. If it is almost time for your next dose, take only that dose. Do not take double or extra doses. What may interact with this medicine?  levodopa This list may not describe all possible interactions. Give your health care provider a list of all the medicines, herbs, non-prescription drugs, or dietary supplements you use. Also tell them if you smoke, drink alcohol, or use illegal  drugs. Some items may interact with your medicine. What should I watch for while using this medicine? See your health care professional for regular checks on your progress. Remember that vitamin supplements do not replace the need for good nutrition from a balanced diet. What side effects may I notice from receiving this medicine? Side effects that you should report to your doctor or health care professional as soon as possible:  allergic reaction such as skin rash or difficulty breathing  vomiting Side effects that usually do not require medical attention (report to your doctor or health care professional if they continue or are bothersome):  nausea  stomach upset This list may not describe all possible side effects. Call your doctor for medical advice about side effects. You may report side effects to FDA at 1-800-FDA-1088. Where should I keep my medicine? Keep out of the reach of children. Most vitamins should be stored at controlled room temperature. Check your specific product directions. Protect from heat and moisture. Throw away any unused medicine after the expiration date. NOTE: This sheet is a summary. It may not cover all possible information. If you have questions about this medicine, talk to your doctor, pharmacist, or health care provider.  2020 Elsevier/Gold Standard (2008-01-12 00:59:55)  

## 2020-04-03 ENCOUNTER — Telehealth: Payer: Self-pay | Admitting: Obstetrics and Gynecology

## 2020-04-03 ENCOUNTER — Other Ambulatory Visit: Payer: Self-pay | Admitting: Obstetrics and Gynecology

## 2020-04-03 ENCOUNTER — Other Ambulatory Visit: Payer: Self-pay

## 2020-04-03 DIAGNOSIS — R928 Other abnormal and inconclusive findings on diagnostic imaging of breast: Secondary | ICD-10-CM

## 2020-04-03 NOTE — Telephone Encounter (Signed)
Call to patient to review benefits and schedule recommended Urodynamic Profile Study with [facility] A. Quincy Simmonds, MD, Cherlynn June. Unable to leave voicemail due to mailbox being full.

## 2020-04-13 ENCOUNTER — Ambulatory Visit
Admission: RE | Admit: 2020-04-13 | Discharge: 2020-04-13 | Disposition: A | Discharge status: Home / Self Care | Payer: 59 | Source: Ambulatory Visit | Attending: Obstetrics and Gynecology | Admitting: Obstetrics and Gynecology

## 2020-04-13 ENCOUNTER — Other Ambulatory Visit: Payer: Self-pay

## 2020-04-13 ENCOUNTER — Ambulatory Visit: Payer: 59

## 2020-04-13 DIAGNOSIS — R928 Other abnormal and inconclusive findings on diagnostic imaging of breast: Secondary | ICD-10-CM

## 2020-04-21 NOTE — Progress Notes (Signed)
54 y.o. G1P1 Divorced Caucasian female here for annual exam.    Patient was on antibiotics a few weeks ago and feels she may have a yeast infection--itching. She took 1 Diflucan she had at home, but symptoms persist.  She has had multiple LEEPs for either HGSIL or due to unsatisfactory colposcopy.  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  We have been discussing possible hysterectomy due to her persistent dysplasia and difficulty evaluating her cervix.  She has urinary incontinence, so we have also reviewed the possibility of anti-incontinence surgery at the time of potential hysterectomy.   FSH was 64.7 on 09/25/18. LMP was 03/08/19.   Reports fatigue.   Weight gain.   Will return to the office soon for work instead of working from home.   PCP: Carol Ada, MD  Patient's last menstrual period was 03/08/2019 (exact date).           Sexually active: No.  The current method of family planning is tubal ligation.    Exercising: No.  The patient does not participate in regular exercise at present. Smoker:  no  Health Maintenance: Pap History: 10-22-19 ASCUS:Pos HR HPV, 05-02-19 Neg:Neg HR HPV, 09-25-18 Neg:Neg HR HPV History of abnormal Pap: Yes, 10-22-19 ASCUS:Pos HR HPV--colpo LGSIL w/cervical stenosis & unable to assess endocervix--LEEP CIN I and ECC benign(showing minute atypical squamous fragment.04-17-18 LGSIL:Pos HR HPV--colpo LGSIL--LEEP was normal.  MMG: 4-28-21Lt.Br.poss.mass;Rt.Br.Neg/density B 3D/Lt.Br.poss.mass;Rt.Br.Neg/density B--Diag.Lt.Br./Neg/BiRads1 Colonoscopy: ~2015 normal per patient;Pt unsure when next one due BMD: 10-19-17 Result :Normal TDaP:  10-07-13 Gardasil:   no HIV: 04-22-19 NR Hep C: 04-22-19 Neg Screening Labs:  ----   reports that she has never smoked. She has never used smokeless tobacco. She reports current alcohol use. She reports that she does not use drugs.  Past Medical History:  Diagnosis Date  .  Abnormal Pap smear   . Anemia   . Anxiety   . Broken toe 12-22-15   middle toe right foot  . Cataract of both eyes   . Depression   . GERD (gastroesophageal reflux disease)   . Headache(784.0)   . Heart murmur   . History of kidney stones   . 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 Migraine   . Perforated bowel ([place])   . Pernicious anemia 06/19/2015  . PONV (postoperative nausea and vomiting)   . Thyroid disease    hypothyrodism    Past Surgical History:  Procedure Laterality Date  . CATARACT EXTRACTION, BILATERAL Bilateral 02/2016  . CERVICAL BIOPSY  W/ LOOP ELECTRODE EXCISION  11/2013, 05/2018, 11/2019   LGSIL, negative for dysplasia, LGSIL and positive ECC with potential LGSIL  - respectively  . 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 CESAREAN SECTION  2008  . CHOLECYSTECTOMY    . GASTRIC BYPASS  2002  . HYSTEROSCOPY WITH D & C  10/14/2011   Procedure: DILATATION AND CURETTAGE (D&C) /HYSTEROSCOPY;  Surgeon: Arloa Koh;  Location: [facility];  Service: Gynecology;  Laterality: Bilateral;  . LASIK    . NOSE SURGERY    . STOMACH SURGERY  04/20/2015   dilation of "connection between stomach and intestine", [facility]  . TONSILLECTOMY    . TONSILLECTOMY    . TUBAL LIGATION  2008    Current Outpatient Medications  Medication Sig Dispense Refill  . AIMOVIG 140 MG/ML SOAJ every 30 (thirty) days.    . ALPRAZolam (XANAX) 1 MG tablet Take 1 mg  by mouth 2 (two) times daily as needed. Anxiety      . Armodafinil 250 MG tablet Take 250 mg by mouth every morning.  2  . baclofen (LIORESAL) 10 MG tablet Take by mouth.    . botulinum toxin Type A (BOTOX) 100 units SOLR injection once. Unaware of dose, pt gets q 22mo for migraines    . Botulinum Toxin Type A, Cosm, 100 units SOLR once. Unaware of dose, pt gets q 103mo for migraines    . CONTRAVE 8-90 MG TB12 Take 2 tablets by mouth 2 (two) times daily.    Marland Kitchen DEXILANT 60 MG capsule Take 60 mg  by mouth daily.    . diphenhydrAMINE (BENADRYL) 12.5 MG/5ML elixir Take 12.5 mg by mouth every 6 (six) hours as needed.     . divalproex (DEPAKOTE) 500 MG DR tablet Take 1 tablet (500 mg total) by mouth 2 (two) times daily. 180 tablet 4  . doxepin (SINEQUAN) 75 MG capsule 75 mg at bedtime.     Marland Kitchen eletriptan (RELPAX) 40 MG tablet Take 1 tablet (40 mg total) by mouth as needed for migraine or headache. May repeat in 2 hours if needed. Max 2 tabs per day or 8 tabs per month. 10 tablet 12  . ergocalciferol (VITAMIN D2) 50000 UNITS capsule Take 50,000 Units by mouth. She is taking daily M-F - pr pt    . Eszopiclone 3 MG TABS TK 1 T PO QD HS  1  . fexofenadine (ALLEGRA) 180 MG tablet Take 180 mg by mouth daily as needed. allergies     . fluconazole (DIFLUCAN) 150 MG tablet Take 1 tablet by mouth once and may repeat in 72 hours if needed. 2 tablet 0  . fluticasone (FLONASE) 50 MCG/ACT nasal spray Place 1 spray into both nostrils as needed.  1  . gabapentin (NEURONTIN) 300 MG capsule Take one pill at night for 1 week.  Increase to 2 pills at night for 1 week.  Increase to 3 pills at night until seen    . hydrOXYzine (ATARAX/VISTARIL) 25 MG tablet TAKE 1 TABLET BY MOUTH AS NEEDED FOR HEADACHE UPTO EVERY 8 HOURS  2  . imipramine (TOFRANIL) 50 MG tablet Take 2 tablets by mouth daily.  0  . lansoprazole (PREVACID) 30 MG capsule Take 30 mg by mouth daily as needed. Acid reflux      . Lasmiditan Succinate 100 MG TABS Take by mouth.    . levothyroxine (SYNTHROID) 50 MCG tablet TAKE 1 TABLET BY MOUTH EVERY DAY 30 MINUTES PRIOR TO EATING 30 tablet 7  . LINZESS 145 MCG CAPS capsule Take 145 mcg by mouth daily.  3  . Multiple Vitamins-Minerals (MULTIVITAMIN WITH MINERALS) tablet Take 1 tablet by mouth daily.      Marland Kitchen nystatin (NYSTATIN) powder Apply topically 2 (two) times daily. (Patient taking differently: Apply topically 2 (two) times daily as needed. ) 45 g 1  . ondansetron (ZOFRAN) 4 MG tablet Take 1 tablet by  mouth as needed.    Marland Kitchen OZEMPIC, 0.25 OR 0.5 MG/DOSE, 2 MG/1.5ML SOPN SMARTSIG:0.25 Milligram(s) SUB-Q Once a Week    . promethazine (PHENERGAN) 12.5 MG tablet TAKE 1 TABLET(12.5 MG) BY MOUTH EVERY 8 HOURS AS NEEDED FOR NAUSEA OR VOMITING 15 tablet 0  . promethazine (PROMETHEGAN) 25 MG suppository SMARTSIG:1 SUPPOS Rectally Every 6 Hours PRN    . ranitidine (ZANTAC) 150 MG capsule     . Semaglutide,0.25 or 0.5MG /DOS, 2 MG/1.5ML SOPN Inject into the skin.    Marland Kitchen  thiamine (VITAMIN B-1) 50 MG tablet daily.  0  . tiZANidine (ZANAFLEX) 4 MG capsule Take 2-4 mg by mouth at bedtime.  0  . UBRELVY 50 MG TABS daily.    Marland Kitchen venlafaxine XR (EFFEXOR-XR) 150 MG 24 hr capsule Take 1 capsule by mouth daily.  0  . WELLBUTRIN XL 300 MG 24 hr tablet TK 1 T PO  D QAM  1  . zonisamide (ZONEGRAN) 100 MG capsule Take 1 capsule (100 mg total) by mouth 2 (two) times daily. 180 capsule 4  . potassium chloride SA (K-DUR) 20 MEQ tablet Take 1 tablet (20 mEq total) by mouth daily for 10 days. 10 tablet 0   No current facility-administered medications for this visit.    Family History  Problem Relation Age of Onset  . Diabetes Mother   . Heart disease Father   . Social phobia Father   . Psychiatric Illness Father   . Hypertension Father   . Stroke Father   . Dementia Father   . Heart disease Other   . Diabetes Other   . Stroke Other   . Hypertension Maternal Grandmother   . Thyroid disease Maternal Grandmother   . Hypertension Paternal Grandmother   . Stroke Paternal Grandfather   . Breast cancer Neg Hx     Review of Systems  All other systems reviewed and are negative.   Exam:   BP 120/84 (Cuff Size: Large)   Pulse 66   Temp (!) 97.2 F (36.2 C) (Temporal)   Resp 16   Ht 5\' 3"  (1.6 m)   Wt 203 lb (92.1 kg)   LMP 03/08/2019 (Exact Date)   BMI 35.96 kg/m     General appearance: alert, cooperative and appears stated age Head: normocephalic, without obvious abnormality, atraumatic Neck: no adenopathy,  supple, symmetrical, trachea midline and thyroid normal to inspection and palpation Lungs: clear to auscultation bilaterally Breasts: normal appearance, no masses or tenderness, No nipple retraction or dimpling, No nipple discharge or bleeding, No axillary adenopathy Heart: regular rate and rhythm Abdomen: soft, non-tender; no masses, no organomegaly Extremities: extremities normal, atraumatic, no cyanosis or edema Skin: skin color, texture, turgor normal. No rashes or lesions Lymph nodes: cervical, supraclavicular, and axillary nodes normal. Neurologic: grossly normal  Pelvic: External genitalia:  Erythema of vulva.                No abnormal inguinal nodes palpated.              Urethra:  normal appearing urethra with no masses, tenderness or lesions              Bartholins and Skenes: normal                 Vagina: normal appearing vagina with normal color and discharge, no lesions              Cervix: no lesions              Pap taken: Yes.   Bimanual Exam:  Uterus:  normal size, contour, position, consistency, mobility, non-tender              Adnexa: no mass, fullness, tenderness              Rectal exam: Yes.  .  Confirms.              Anus:  normal sphincter tone, no lesions  Chaperone was present for exam.  Assessment:   Well woman visit  with gynecologic exam.  Hx CIN III.   Status post multiple LEEPs for recurrent dysplasia and unsatisfactory colposcopies. Hx urinary incontinence.  Vaginitis.  STD screening. Fatigue.  Weight gain.  Status post weight loss surgery with revision.  Hypothyroidism.  Anemia.  Status post multiple abdominal surgeries.  Plan: Mammogram screening discussed. Self breast awareness reviewed. Pap and HR HPV as above. Guidelines for Calcium, Vitamin D, regular exercise program including cardiovascular and weight bearing exercise. GC/CT/trich testing from pap.  We will review her pap and determine the next steps in her care due to her long term  cervical dysplasia.  She will see her endocrinologist.  Follow up annually and prn.  After visit summary provided.

## 2020-04-22 ENCOUNTER — Other Ambulatory Visit (HOSPITAL_COMMUNITY)
Admission: RE | Admit: 2020-04-22 | Discharge: 2020-04-22 | Disposition: A | Discharge status: Home / Self Care | Payer: 59 | Source: Ambulatory Visit | Attending: Obstetrics and Gynecology | Admitting: Obstetrics and Gynecology

## 2020-04-22 ENCOUNTER — Encounter: Payer: Self-pay | Admitting: Obstetrics and Gynecology

## 2020-04-22 ENCOUNTER — Ambulatory Visit: Payer: 59 | Admitting: Obstetrics and Gynecology

## 2020-04-22 ENCOUNTER — Other Ambulatory Visit: Payer: Self-pay

## 2020-04-22 ENCOUNTER — Ambulatory Visit: Payer: 59

## 2020-04-22 VITALS — BP 120/84 | HR 66 | Temp 97.2°F | Resp 16 | Ht 63.0 in | Wt 203.0 lb

## 2020-04-22 DIAGNOSIS — Z01419 Encounter for gynecological examination (general) (routine) without abnormal findings: Secondary | ICD-10-CM | POA: Diagnosis present

## 2020-04-22 DIAGNOSIS — Z113 Encounter for screening for infections with a predominantly sexual mode of transmission: Secondary | ICD-10-CM | POA: Diagnosis not present

## 2020-04-22 DIAGNOSIS — N760 Acute vaginitis: Secondary | ICD-10-CM

## 2020-04-22 LAB — POCT URINALYSIS DIPSTICK
Bilirubin, UA: NEGATIVE
Blood, UA: NEGATIVE
Glucose, UA: NEGATIVE
Ketones, UA: NEGATIVE
Leukocytes, UA: NEGATIVE
Nitrite, UA: NEGATIVE
Protein, UA: NEGATIVE
Urobilinogen, UA: 0.2 E.U./dL
pH, UA: 5 (ref 5.0–8.0)

## 2020-04-22 MED ORDER — NYSTATIN-TRIAMCINOLONE 100000-0.1 UNIT/GM-% EX CREA: 1.0000 "application " | TOPICAL_CREAM | Freq: Two times a day (BID) | CUTANEOUS | 0 refills | Status: AC

## 2020-04-23 ENCOUNTER — Inpatient Hospital Stay: Payer: 59 | Attending: Hematology & Oncology

## 2020-04-23 LAB — VAGINITIS/VAGINOSIS, DNA PROBE
Candida Species: POSITIVE — AB
Gardnerella vaginalis: NEGATIVE
Trichomonas vaginosis: NEGATIVE

## 2020-04-24 ENCOUNTER — Telehealth: Payer: Self-pay

## 2020-04-24 DIAGNOSIS — R8290 Unspecified abnormal findings in urine: Secondary | ICD-10-CM

## 2020-04-24 LAB — CYTOLOGY - PAP
Chlamydia: NEGATIVE
Comment: NEGATIVE
Comment: NEGATIVE
Comment: NEGATIVE
Comment: NORMAL
High risk HPV: POSITIVE — AB
Neisseria Gonorrhea: NEGATIVE
Trichomonas: NEGATIVE

## 2020-04-24 NOTE — Telephone Encounter (Signed)
-----   Message from Nunzio Cobbs, MD sent at 04/24/2020  1:31 PM EDT ----- Please contact patient with Affirm results showing yeast.  I recommend a course of Diflucan 150 mg po x 1 and repeat in 72 hours prn.  Disp:  2 RF:  none.

## 2020-04-24 NOTE — Telephone Encounter (Signed)
Call placed to pt. Pt VM box full, no answer and no message left. Will send Mychart message.

## 2020-04-27 NOTE — Telephone Encounter (Signed)
Spoke with pt. Pt given results and recommendations per Dr Quincy Simmonds. Pt agreeable. Pharmacy verified.   Before sending Rx,  pt has allergies to Tioconazole and Miconazole with hard stop on Rx.   Ok to send Rx for Diflucan? Please advise. Pt has taken Diflucan Rx 10/2019.  Pt also requesting results of Pap smear from 04/22/20. Pt advised that once reviewed by Dr Quincy Simmonds, then will return call to pt. Pt agreeable.   Routing to Dr Quincy Simmonds.

## 2020-04-28 MED ORDER — FLUCONAZOLE 150 MG PO TABS: ORAL_TABLET | ORAL | 0 refills | Status: DC

## 2020-04-28 NOTE — Telephone Encounter (Signed)
Ok for her to take the Diflucan which she has tolerated in the past.   I am still reviewing her pap and developing a plan.

## 2020-04-28 NOTE — Telephone Encounter (Signed)
Spoke with pt. Pt given update on Rx Diflucan. Sent to pharmacy on file. Pt verbalized understanding. Pt also given update for Pap results. Pt agreeable and will wait for return call for plan of care.  Routing to Dr Quincy Simmonds.  Encounter closed.

## 2020-04-30 ENCOUNTER — Telehealth: Payer: Self-pay

## 2020-04-30 NOTE — Telephone Encounter (Signed)
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  This is stable from her prior colposcopy and then LEEP about 6 months ago.   Her options are pap and HR HPV in 12 months versus proceeding with hysterectomy and potential tx for urinary incontinence at the same time.  Either option is acceptable.  Please place her in 12 month recall.   Her testing is negative for gonorrhea, chlamydia, and trichomonas.

## 2020-04-30 NOTE — Telephone Encounter (Signed)
Spoke with pt. Pt given results and recommendations per Dr Quincy Simmonds. Pt unsure at this time of what option to choose. Pt states will think about it and then will return call to office. Pt advised can have OV consult to discuss options. Pt agreeable. Pt states out of town for the weekend and will call back to schedule possible OV consult with Dr Quincy Simmonds.   Routing to Dr Quincy Simmonds for review. 12 recall placed  Encounter closed.

## 2020-05-13 ENCOUNTER — Ambulatory Visit: Payer: 59

## 2020-05-14 ENCOUNTER — Ambulatory Visit: Payer: 59

## 2020-06-03 ENCOUNTER — Inpatient Hospital Stay: Payer: 59

## 2020-06-03 ENCOUNTER — Inpatient Hospital Stay (HOSPITAL_BASED_OUTPATIENT_CLINIC_OR_DEPARTMENT_OTHER): Payer: 59 | Admitting: Family

## 2020-06-03 ENCOUNTER — Other Ambulatory Visit: Payer: Self-pay

## 2020-06-03 ENCOUNTER — Encounter: Payer: Self-pay | Admitting: Family

## 2020-06-03 ENCOUNTER — Inpatient Hospital Stay: Payer: 59 | Attending: Hematology & Oncology

## 2020-06-03 VITALS — BP 124/96 | HR 76 | Temp 98.9°F | Resp 18 | Ht 63.0 in

## 2020-06-03 DIAGNOSIS — Z903 Acquired absence of stomach [part of]: Secondary | ICD-10-CM | POA: Diagnosis not present

## 2020-06-03 DIAGNOSIS — D510 Vitamin B12 deficiency anemia due to intrinsic factor deficiency: Secondary | ICD-10-CM

## 2020-06-03 DIAGNOSIS — K912 Postsurgical malabsorption, not elsewhere classified: Secondary | ICD-10-CM

## 2020-06-03 DIAGNOSIS — Z9884 Bariatric surgery status: Secondary | ICD-10-CM | POA: Insufficient documentation

## 2020-06-03 DIAGNOSIS — D508 Other iron deficiency anemias: Secondary | ICD-10-CM | POA: Diagnosis not present

## 2020-06-03 DIAGNOSIS — D500 Iron deficiency anemia secondary to blood loss (chronic): Secondary | ICD-10-CM | POA: Insufficient documentation

## 2020-06-03 DIAGNOSIS — N920 Excessive and frequent menstruation with regular cycle: Secondary | ICD-10-CM | POA: Insufficient documentation

## 2020-06-03 LAB — CBC WITH DIFFERENTIAL (CANCER CENTER ONLY)
Abs Immature Granulocytes: 0.02 10*3/uL (ref 0.00–0.07)
Basophils Absolute: 0 10*3/uL (ref 0.0–0.1)
Basophils Relative: 1 %
Eosinophils Absolute: 0.1 10*3/uL (ref 0.0–0.5)
Eosinophils Relative: 1 %
HCT: 43.6 % (ref 36.0–46.0)
Hemoglobin: 14.1 g/dL (ref 12.0–15.0)
Immature Granulocytes: 0 %
Lymphocytes Relative: 43 %
Lymphs Abs: 3 10*3/uL (ref 0.7–4.0)
MCH: 31.1 pg (ref 26.0–34.0)
MCHC: 32.3 g/dL (ref 30.0–36.0)
MCV: 96.2 fL (ref 80.0–100.0)
Monocytes Absolute: 0.4 10*3/uL (ref 0.1–1.0)
Monocytes Relative: 6 %
Neutro Abs: 3.4 10*3/uL (ref 1.7–7.7)
Neutrophils Relative %: 49 %
Platelet Count: 261 10*3/uL (ref 150–400)
RBC: 4.53 MIL/uL (ref 3.87–5.11)
RDW: 12 % (ref 11.5–15.5)
WBC Count: 6.9 10*3/uL (ref 4.0–10.5)
nRBC: 0 % (ref 0.0–0.2)

## 2020-06-03 LAB — VITAMIN B12: Vitamin B-12: 281 pg/mL (ref 180–914)

## 2020-06-03 LAB — CMP (CANCER CENTER ONLY)
ALT: 13 U/L (ref 0–44)
AST: 15 U/L (ref 15–41)
Albumin: 4 g/dL (ref 3.5–5.0)
Alkaline Phosphatase: 63 U/L (ref 38–126)
Anion gap: 8 (ref 5–15)
BUN: 9 mg/dL (ref 6–20)
CO2: 31 mmol/L (ref 22–32)
Calcium: 9.6 mg/dL (ref 8.9–10.3)
Chloride: 102 mmol/L (ref 98–111)
Creatinine: 0.82 mg/dL (ref 0.44–1.00)
GFR, Est AFR Am: >60 mL/min (ref >60)
GFR, Estimated: >60 mL/min (ref >60)
Glucose, Bld: 144 mg/dL — ABNORMAL HIGH (ref 70–99)
Potassium: 4.6 mmol/L (ref 3.5–5.1)
Sodium: 141 mmol/L (ref 135–145)
Total Bilirubin: 0.4 mg/dL (ref 0.3–1.2)
Total Protein: 6.6 g/dL (ref 6.5–8.1)

## 2020-06-03 MED ORDER — CYANOCOBALAMIN 1000 MCG/ML IJ SOLN
INTRAMUSCULAR | Status: AC
  Filled 2020-06-03: qty 1

## 2020-06-03 MED ORDER — CYANOCOBALAMIN 1000 MCG/ML IJ SOLN
1000.0000 ug | Freq: Once | INTRAMUSCULAR | Status: AC
  Administered 2020-06-03: 1000 ug via INTRAMUSCULAR

## 2020-06-03 NOTE — Patient Instructions (Signed)

## 2020-06-03 NOTE — Progress Notes (Signed)
Hematology and Oncology Follow Up Visit  Megan Davila [id number] 04-21-1966 54 y.o. 06/03/2020   Principle Diagnosis:  Iron deficiency anemia secondary to malabsorption Pernicious anemia saner to gastric bypass Iron deficiency anemia secondary to menorrhagia  Current Therapy: IV iron as indicated  Vitamin B 12 1,000 mcg   Interim History:  [name] is here today for follow-up and B 12 injection. She is doing fairly well but still has fatigue and is frustrated with weight gain. She plans to contact her team at [facility] (where she had the gastric bypass surgery) and see if there is anything else they can offer her.  She has been eating a healthy diet and hydrating well. Her weight is 203 lbs this visit.  B 12 level in April was 299. Iron studies were stable at last encounter.  No fever, chills, n/v, cough, rash, dizziness, chest pain, palpitations, abdominal pain or changes in bowel or bladder habits.  She has had some mild SOB with over exertion and takes and break to rest when needed.  She has occasional numbness and tingling in her hands. This comes and goes.  No swelling or tenderness in her extremities.  No falls or syncope. She has not noted any blood loss. No bruising or petechiae.   ECOG Performance Status: 1 - Symptomatic but completely ambulatory  Medications:  Allergies as of 06/03/2020      Reactions   Naproxen Anaphylaxis   Monistat [miconazole] Other (See Comments)   "burning"   Sulfa Antibiotics Hives   Tioconazole Itching      Medication List       Accurate as of June 03, 2020  1:55 PM. If you have any questions, ask your nurse or doctor.        Aimovig 140 MG/ML Soaj Generic drug: Erenumab-aooe every 30 (thirty) days.   ALPRAZolam 1 MG tablet Commonly known as: XANAX Take 1 mg by mouth 2 (two) times daily as needed. Anxiety   Armodafinil 250 MG tablet Take 250 mg by mouth every morning.   Botulinum Toxin Type A (Cosm) 100 units  Solr once. Unaware of dose, pt gets q 104mo for migraines   botulinum toxin Type A 100 units Solr injection Commonly known as: BOTOX once. Unaware of dose, pt gets q 42mo for migraines   Contrave 8-90 MG Tb12 Generic drug: Naltrexone-buPROPion HCl ER Take 2 tablets by mouth 2 (two) times daily.   Dexilant 60 MG capsule Generic drug: dexlansoprazole Take 60 mg by mouth daily.   diphenhydrAMINE 12.5 MG/5ML elixir Commonly known as: BENADRYL Take 12.5 mg by mouth every 6 (six) hours as needed.   divalproex 500 MG DR tablet Commonly known as: DEPAKOTE Take 1 tablet (500 mg total) by mouth 2 (two) times daily.   doxepin 75 MG capsule Commonly known as: SINEQUAN 75 mg at bedtime.   eletriptan 40 MG tablet Commonly known as: RELPAX Take 1 tablet (40 mg total) by mouth as needed for migraine or headache. May repeat in 2 hours if needed. Max 2 tabs per day or 8 tabs per month.   ergocalciferol 1.25 MG (50000 UT) capsule Commonly known as: VITAMIN D2 Take 50,000 Units by mouth. She is taking daily M-F - pr pt   Eszopiclone 3 MG Tabs TK 1 T PO QD HS   fexofenadine 180 MG tablet Commonly known as: ALLEGRA Take 180 mg by mouth daily as needed. allergies   fluconazole 150 MG tablet Commonly known as: DIFLUCAN Take 1 tablet by  mouth once and may repeat in 72 hours if needed.   fluticasone 50 MCG/ACT nasal spray Commonly known as: FLONASE Place 1 spray into both nostrils as needed.   gabapentin 300 MG capsule Commonly known as: NEURONTIN Take one pill at night for 1 week.  Increase to 2 pills at night for 1 week.  Increase to 3 pills at night until seen   hydrOXYzine 25 MG tablet Commonly known as: ATARAX/VISTARIL TAKE 1 TABLET BY MOUTH AS NEEDED FOR HEADACHE UPTO EVERY 8 HOURS   imipramine 50 MG tablet Commonly known as: TOFRANIL Take 2 tablets by mouth daily.   lansoprazole 30 MG capsule Commonly known as: PREVACID Take 30 mg by mouth daily as needed. Acid reflux     Lasmiditan Succinate 100 MG Tabs Take by mouth.   levothyroxine 50 MCG tablet Commonly known as: SYNTHROID TAKE 1 TABLET BY MOUTH EVERY DAY 30 MINUTES PRIOR TO EATING   Linzess 145 MCG Caps capsule Generic drug: linaclotide Take 145 mcg by mouth daily.   multivitamin with minerals tablet Take 1 tablet by mouth daily.   nystatin powder Commonly known as: nystatin Apply topically 2 (two) times daily. What changed:   when to take this  reasons to take this   nystatin-triamcinolone cream Commonly known as: MYCOLOG II Apply 1 application topically 2 (two) times daily. Apply to affected area BID for up to 7 days.   ondansetron 4 MG tablet Commonly known as: ZOFRAN Take 1 tablet by mouth as needed.   Ozempic (0.25 or 0.5 MG/DOSE) 2 MG/1.5ML Sopn Generic drug: Semaglutide(0.25 or 0.5MG /DOS) SMARTSIG:0.25 Milligram(s) SUB-Q Once a Week   Semaglutide(0.25 or 0.5MG /DOS) 2 MG/1.5ML Sopn Inject into the skin.   potassium chloride SA 20 MEQ tablet Commonly known as: KLOR-CON Take 1 tablet (20 mEq total) by mouth daily for 10 days.   promethazine 12.5 MG tablet Commonly known as: PHENERGAN TAKE 1 TABLET(12.5 MG) BY MOUTH EVERY 8 HOURS AS NEEDED FOR NAUSEA OR VOMITING   Promethegan 25 MG suppository Generic drug: promethazine SMARTSIG:1 SUPPOS Rectally Every 6 Hours PRN   ranitidine 150 MG capsule Commonly known as: ZANTAC   thiamine 50 MG tablet Commonly known as: VITAMIN B-1 daily.   tiZANidine 4 MG capsule Commonly known as: ZANAFLEX Take 2-4 mg by mouth at bedtime.   Ubrelvy 50 MG Tabs Generic drug: Ubrogepant daily.   venlafaxine XR 150 MG 24 hr capsule Commonly known as: EFFEXOR-XR Take 1 capsule by mouth daily.   Wellbutrin XL 300 MG 24 hr tablet Generic drug: buPROPion TK 1 T PO  D QAM   zonisamide 100 MG capsule Commonly known as: ZONEGRAN Take 1 capsule (100 mg total) by mouth 2 (two) times daily.       Allergies:  Allergies  Allergen  Reactions  . Naproxen Anaphylaxis  . Monistat [Miconazole] Other (See Comments)    "burning"   . Sulfa Antibiotics Hives  . Tioconazole Itching    Past Medical History, Surgical history, Social history, and Family History were reviewed and updated.  Review of Systems: All other 10 point review of systems is negative.   Physical Exam:  vitals were not taken for this visit.   Wt Readings from Last 3 Encounters:  04/22/20 203 lb (92.1 kg)  03/11/20 201 lb 12.8 oz (91.5 kg)  12/25/19 205 lb 9.6 oz (93.3 kg)    Ocular: Sclerae unicteric, pupils equal, round and reactive to light Ear-nose-throat: Oropharynx clear, dentition fair Lymphatic: No cervical or supraclavicular adenopathy Lungs  no rales or rhonchi, good excursion bilaterally Heart regular rate and rhythm, no murmur appreciated Abd soft, nontender, positive bowel sounds, no liver or spleen tip palpated on exam, no fluid wave  MSK no focal spinal tenderness, no joint edema Neuro: non-focal, well-oriented, appropriate affect Breasts: Deferred   Lab Results  Component Value Date   WBC 6.9 06/03/2020   HGB 14.1 06/03/2020   HCT 43.6 06/03/2020   MCV 96.2 06/03/2020   PLT 261 06/03/2020   Lab Results  Component Value Date   FERRITIN 169 03/10/2020   IRON 76 03/10/2020   TIBC 290 03/10/2020   UIBC 214 03/10/2020   IRONPCTSAT 26 03/10/2020   Lab Results  Component Value Date   RETICCTPCT 1.1 07/03/2019   RBC 4.53 06/03/2020   RETICCTABS 49.62 09/07/2016   No results found for: KPAFRELGTCHN, LAMBDASER, KAPLAMBRATIO No results found for: IGGSERUM, IGA, IGMSERUM No results found for: Odetta Pink, SPEI   Chemistry      Component Value Date/Time   NA 139 03/10/2020 1240   NA 137 10/18/2017 1630   NA 140 09/07/2016 1327   K 4.0 03/10/2020 1240   K 3.4 (L) 09/07/2016 1327   CL 103 03/10/2020 1240   CO2 26 03/10/2020 1240   CO2 22 09/07/2016 1327   BUN 10  03/10/2020 1240   BUN 18 10/18/2017 1630   BUN 13.3 09/07/2016 1327   CREATININE 0.85 03/10/2020 1240   CREATININE 0.70 12/29/2016 1652   CREATININE 0.8 09/07/2016 1327      Component Value Date/Time   CALCIUM 9.1 03/10/2020 1240   CALCIUM 8.2 (L) 09/07/2016 1327   ALKPHOS 63 03/10/2020 1240   ALKPHOS 54 09/07/2016 1327   AST 11 (L) 03/10/2020 1240   AST 11 09/07/2016 1327   ALT 7 03/10/2020 1240   ALT <9 09/07/2016 1327   BILITOT 0.5 03/10/2020 1240   BILITOT 0.41 09/07/2016 1327       Impression and Plan: Ms. Heninger is a very pleasant 54yo caucasian female with both B 12 and iron deficiency 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 Iron studies are pending. We will replace if needed.  She did get B 12 while here today.  She will stay on her every 3 week injection scheduled with follow-up in 3 weeks.  She will contact our office with any questions or concerns. We can certainly see her sooner if needed.   Laverna Peace, NP 6/30/20211:55 PM

## 2020-06-04 LAB — IRON AND TIBC
Iron: 66 ug/dL (ref 41–142)
Saturation Ratios: 22 % (ref 21–57)
TIBC: 305 ug/dL (ref 236–444)
UIBC: 239 ug/dL (ref 120–384)

## 2020-06-04 LAB — FERRITIN: Ferritin: 178 ng/mL (ref 11–307)

## 2020-06-10 ENCOUNTER — Other Ambulatory Visit: Payer: Self-pay | Admitting: Physician Assistant

## 2020-06-10 DIAGNOSIS — R112 Nausea with vomiting, unspecified: Secondary | ICD-10-CM

## 2020-06-10 DIAGNOSIS — K219 Gastro-esophageal reflux disease without esophagitis: Secondary | ICD-10-CM

## 2020-06-15 ENCOUNTER — Other Ambulatory Visit: Payer: Self-pay | Admitting: Physician Assistant

## 2020-06-15 ENCOUNTER — Ambulatory Visit
Admission: RE | Admit: 2020-06-15 | Discharge: 2020-06-15 | Disposition: A | Discharge status: Home / Self Care | Payer: 59 | Source: Ambulatory Visit | Attending: Physician Assistant | Admitting: Physician Assistant

## 2020-06-15 DIAGNOSIS — R112 Nausea with vomiting, unspecified: Secondary | ICD-10-CM

## 2020-06-15 DIAGNOSIS — K219 Gastro-esophageal reflux disease without esophagitis: Secondary | ICD-10-CM

## 2020-06-18 ENCOUNTER — Telehealth: Payer: Self-pay | Admitting: Obstetrics and Gynecology

## 2020-06-18 NOTE — Telephone Encounter (Signed)
Patient called to inquire about "bladder procedure".

## 2020-06-19 NOTE — Telephone Encounter (Signed)
Spoke with patient. Patient is requesting to proceed with urodynamics testing previously discussed. Patient reports urinary incontinence has not improved, has not resolved. Denies any new symptoms. Advised patient I will update Dr. Quincy Simmonds and our office will return call to review benefits and scheduling. Patient agreeable.   Last AEX 04/22/20  Routing to Dr. Antony Blackbird.   Cc: Hayley Carder, Reesa Chew, RN

## 2020-06-19 NOTE — Telephone Encounter (Signed)
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  This is stable from her prior colposcopy and then LEEP about 6 months ago.   Her options are pap and HR HPV in 12 months versus proceeding with hysterectomy and potential tx for urinary incontinence at the same time.  Either option is acceptable.  Please place her in 12 month recall.   Her testing is negative for gonorrhea, chlamydia, and trichomonas.     Please see results note above from [place] regarding patient's options for care. Dr.Silva okay to proceed with Urodynamics?

## 2020-06-21 NOTE — Telephone Encounter (Signed)
Ok to proceed with urodynamic testing.

## 2020-06-22 NOTE — Telephone Encounter (Signed)
Spoke with patient. All urodynamics testing questions answered. Urodynamics scheduled for 07/15/2020 at 9 am. Urinalysis prior to urodynamics scheduled for 07/08/2020 at 3:25 pm. Appointment to discuss results scheduled for 07/22/2020 at 9:30 am with Dr.Silva. Patient is agreeable to all dates and times.  Routing to provider and will close encounter.

## 2020-06-24 ENCOUNTER — Inpatient Hospital Stay: Payer: 59 | Attending: Hematology & Oncology

## 2020-06-24 ENCOUNTER — Other Ambulatory Visit: Payer: 59

## 2020-06-24 ENCOUNTER — Other Ambulatory Visit: Payer: Self-pay

## 2020-06-24 VITALS — BP 121/94 | HR 78 | Temp 98.6°F

## 2020-06-24 DIAGNOSIS — D510 Vitamin B12 deficiency anemia due to intrinsic factor deficiency: Secondary | ICD-10-CM | POA: Insufficient documentation

## 2020-06-24 DIAGNOSIS — K912 Postsurgical malabsorption, not elsewhere classified: Secondary | ICD-10-CM

## 2020-06-24 DIAGNOSIS — Z903 Acquired absence of stomach [part of]: Secondary | ICD-10-CM

## 2020-06-24 MED ORDER — CYANOCOBALAMIN 1000 MCG/ML IJ SOLN
1000.0000 ug | Freq: Once | INTRAMUSCULAR | Status: AC
  Administered 2020-06-24: 1000 ug via INTRAMUSCULAR

## 2020-06-24 MED ORDER — CYANOCOBALAMIN 1000 MCG/ML IJ SOLN
INTRAMUSCULAR | Status: AC
  Filled 2020-06-24: qty 1

## 2020-06-24 NOTE — Patient Instructions (Signed)

## 2020-07-08 ENCOUNTER — Ambulatory Visit: Payer: 59

## 2020-07-09 ENCOUNTER — Other Ambulatory Visit: Payer: Self-pay

## 2020-07-09 ENCOUNTER — Ambulatory Visit (INDEPENDENT_AMBULATORY_CARE_PROVIDER_SITE_OTHER): Payer: 59

## 2020-07-09 VITALS — BP 116/70 | HR 80 | Resp 14 | Ht 63.5 in | Wt 198.0 lb

## 2020-07-09 DIAGNOSIS — Z01812 Encounter for preprocedural laboratory examination: Secondary | ICD-10-CM

## 2020-07-09 DIAGNOSIS — R319 Hematuria, unspecified: Secondary | ICD-10-CM | POA: Diagnosis not present

## 2020-07-09 LAB — POCT URINALYSIS DIPSTICK
Bilirubin, UA: NEGATIVE
Glucose, UA: NEGATIVE
Ketones, UA: NEGATIVE
Leukocytes, UA: NEGATIVE
Nitrite, UA: NEGATIVE
Protein, UA: NEGATIVE
Urobilinogen, UA: 0.2 E.U./dL
pH, UA: 5 (ref 5.0–8.0)

## 2020-07-09 NOTE — Progress Notes (Signed)
Patient in office for a urinalysis test before having urodynamics 07/15/20. Clean catch urine collected and results show a trace of RBC. Patient denies having any new symptoms other than frequency that has been ongoing for a while. Urine sample has been sent to the lab for urine culture and patient advised that we will contact her regarding results. Patient is agreeable and verbalizes understanding. Routing to provider for final review and closing encounter.

## 2020-07-10 ENCOUNTER — Telehealth: Payer: Self-pay

## 2020-07-10 LAB — URINE CULTURE

## 2020-07-10 NOTE — Telephone Encounter (Signed)
Patient is calling in regards to results.  

## 2020-07-10 NOTE — Telephone Encounter (Signed)
Urine culture completed 07/09/20. Patient asking if results are completed? States she received a call from the office, no message.   Advised urine culture take at least 72 hours for results, our office will notify of final results and recommendations once completed and reviewed by provider. Advised no open telephone notes. No additional questions from patient. Patient thankful for call.   Encounter closed.

## 2020-07-15 ENCOUNTER — Other Ambulatory Visit: Payer: Self-pay

## 2020-07-15 ENCOUNTER — Other Ambulatory Visit: Payer: 59

## 2020-07-15 ENCOUNTER — Inpatient Hospital Stay: Payer: 59 | Attending: Hematology & Oncology

## 2020-07-15 ENCOUNTER — Ambulatory Visit (INDEPENDENT_AMBULATORY_CARE_PROVIDER_SITE_OTHER): Payer: 59 | Admitting: *Deleted

## 2020-07-15 DIAGNOSIS — K912 Postsurgical malabsorption, not elsewhere classified: Secondary | ICD-10-CM

## 2020-07-15 DIAGNOSIS — Z903 Acquired absence of stomach [part of]: Secondary | ICD-10-CM

## 2020-07-15 DIAGNOSIS — N393 Stress incontinence (female) (male): Secondary | ICD-10-CM | POA: Diagnosis not present

## 2020-07-15 DIAGNOSIS — D510 Vitamin B12 deficiency anemia due to intrinsic factor deficiency: Secondary | ICD-10-CM

## 2020-07-15 MED ORDER — CYANOCOBALAMIN 1000 MCG/ML IJ SOLN
1000.0000 ug | Freq: Once | INTRAMUSCULAR | Status: AC
  Administered 2020-07-15: 1000 ug via INTRAMUSCULAR

## 2020-07-15 MED ORDER — PEGFILGRASTIM-CBQV 6 MG/0.6ML ~~LOC~~ SOSY
PREFILLED_SYRINGE | SUBCUTANEOUS | Status: AC
  Filled 2020-07-15: qty 0.6

## 2020-07-15 NOTE — Progress Notes (Signed)
Megan Davila is a 54 y.o. female Who presents today for urodynamics testing, ordered by Dr. Quincy Simmonds   Allergies and medications reviewed.  Denies complaints today. No urinary complaints.   Urine Micro exam: negative for WBC's or RBC's, okay to proceed per Dr. Quincy Simmonds.  Patient reports urinary leakage with coughing, sneezing. Patient also complaining of urinary urgency during the day with small amounts of urine voided each time.    Urodynamics testing initiated. Lumax Bladder Catheter #10 [place] and lumax Abdominal Catheter #10 [place].   Post void residual a few drops.   Urethral catheter placed without issue. Rectal catheter placed without issue.   Urodynamics testing completed. Please see scanned Patient summary report in Epic. Procedure completed and patient tolerated well without complaints. Patient scheduled for follow up office visit with Dr. Quincy Simmonds to discuss results. Patient agreeable.   Patient given post procedure instructions:  You may have a mild bladder and rectal discomfort for a few hours after the test. You may experience some frequent urination and slight burning the first few times you urinate after the test. Rarely, the urine may be blood tinged. These are both due to catheter placements and resolve quickly. You should call our office immediately if you have signs of infection, which may include bladder pain, urinary urgency, fever, or burning during urination. We do encourage you to drink plenty of water after the test.

## 2020-07-15 NOTE — Patient Instructions (Signed)
Cyanocobalamin, Pyridoxine, and Folate What is this medicine? A multivitamin containing folic acid, vitamin B6, and vitamin B12. This medicine may be used for other purposes; ask your health care provider or pharmacist if you have questions. COMMON BRAND NAME(S): AllanFol RX, AllanTex, Av-Vite FB, B Complex with Folic Acid, ComBgen, FaBB, Folamin, Folastin, Folbalin, Folbee, Folbic, Folcaps, Folgard, Folgard RX, Folgard RX 2.2, Folplex, Folplex 2.2, Foltabs 800, Foltx, Homocysteine Formula, Niva-Fol, NuFol, TL Gard RX, Virt-Gard, Virt-Vite, Virt-Vite Forte, Vita-Respa What should I tell my health care provider before I take this medicine? They need to know if you have any of these conditions:  bleeding or clotting disorder  history of anemia of any type  other chronic health condition  an unusual or allergic reaction to vitamins, other medicines, foods, dyes, or preservatives  pregnant or trying to get pregnant  breast-feeding How should I use this medicine? Take by mouth with a glass of water. May take with food. Follow the directions on the prescription label. It is usually given once a day. Do not take your medicine more often than directed. Contact your pediatrician regarding the use of this medicine in children. Special care may be needed. Overdosage: If you think you have taken too much of this medicine contact a poison control center or emergency room at once. NOTE: This medicine is only for you. Do not share this medicine with others. What if I miss a dose? If you miss a dose, take it as soon as you can. If it is almost time for your next dose, take only that dose. Do not take double or extra doses. What may interact with this medicine?  levodopa This list may not describe all possible interactions. Give your health care provider a list of all the medicines, herbs, non-prescription drugs, or dietary supplements you use. Also tell them if you smoke, drink alcohol, or use illegal  drugs. Some items may interact with your medicine. What should I watch for while using this medicine? See your health care professional for regular checks on your progress. Remember that vitamin supplements do not replace the need for good nutrition from a balanced diet. What side effects may I notice from receiving this medicine? Side effects that you should report to your doctor or health care professional as soon as possible:  allergic reaction such as skin rash or difficulty breathing  vomiting Side effects that usually do not require medical attention (report to your doctor or health care professional if they continue or are bothersome):  nausea  stomach upset This list may not describe all possible side effects. Call your doctor for medical advice about side effects. You may report side effects to FDA at 1-800-FDA-1088. Where should I keep my medicine? Keep out of the reach of children. Most vitamins should be stored at controlled room temperature. Check your specific product directions. Protect from heat and moisture. Throw away any unused medicine after the expiration date. NOTE: This sheet is a summary. It may not cover all possible information. If you have questions about this medicine, talk to your doctor, pharmacist, or health care provider.  2020 Elsevier/Gold Standard (2008-01-12 00:59:55)  

## 2020-07-16 ENCOUNTER — Telehealth: Payer: Self-pay | Admitting: Obstetrics and Gynecology

## 2020-07-16 NOTE — Telephone Encounter (Signed)
Spoke with Megan Davila. Megan Davila calling to update Dr Quincy Simmonds on question asked during her urodynamics test yesterday. Megan Davila states has counted and states has voided 20 times today. Advised will give update to Dr Quincy Simmonds. Megan Davila agreeable to appt for results of URO on 07/22/20.  Routing to Dr Quincy Simmonds  Encounter closed

## 2020-07-16 NOTE — Telephone Encounter (Signed)
Patient asked to talk with a nurse regarding her urodynamics test.

## 2020-07-16 NOTE — Telephone Encounter (Signed)
Please have patient come to the office on Friday to check for a possible UTI.

## 2020-07-17 ENCOUNTER — Encounter: Payer: Self-pay | Admitting: Obstetrics and Gynecology

## 2020-07-17 ENCOUNTER — Other Ambulatory Visit: Payer: Self-pay

## 2020-07-17 ENCOUNTER — Ambulatory Visit: Payer: 59 | Admitting: Obstetrics and Gynecology

## 2020-07-17 VITALS — BP 126/80 | HR 88 | Resp 16 | Ht 63.5 in | Wt 201.0 lb

## 2020-07-17 DIAGNOSIS — N3281 Overactive bladder: Secondary | ICD-10-CM

## 2020-07-17 DIAGNOSIS — R350 Frequency of micturition: Secondary | ICD-10-CM | POA: Diagnosis not present

## 2020-07-17 DIAGNOSIS — N393 Stress incontinence (female) (male): Secondary | ICD-10-CM

## 2020-07-17 LAB — POCT URINALYSIS DIPSTICK
Bilirubin, UA: NEGATIVE
Blood, UA: 1
Glucose, UA: NEGATIVE
Ketones, UA: NEGATIVE
Leukocytes, UA: NEGATIVE
Nitrite, UA: NEGATIVE
Protein, UA: NEGATIVE
Urobilinogen, UA: 0.2 E.U./dL
pH, UA: 5 (ref 5.0–8.0)

## 2020-07-17 MED ORDER — MIRABEGRON ER 25 MG PO TB24
25.0000 mg | ORAL_TABLET | Freq: Every day | ORAL | 1 refills | Status: DC
Start: 2020-07-17 — End: 2020-09-16

## 2020-07-17 MED ORDER — NITROFURANTOIN MONOHYD MACRO 100 MG PO CAPS: 100.0000 mg | ORAL_CAPSULE | Freq: Two times a day (BID) | ORAL | 0 refills | Status: DC

## 2020-07-17 NOTE — Progress Notes (Signed)
zGYNECOLOGY  VISIT   HPI: 54 y.o.   Divorced  Caucasian  female   G1P1 with No LMP recorded. (Menstrual status: Perimenopausal).   here for UTI; patient complains of having urinary frequency increasing over the last couple of weeks.  She is voiding very often, twice since she arrived here at the office for her appointment.  No pain.  She is having the need to double void.  She leaks with a cough, laugh, and sneeze.   She is wearing a pad due to urinary incontinence.   She is seeing [facility] for a potential endoscopic weight loss procedure.   Urodynamic testing from 07/15/20 Uroflow - void 15 cc with a few drops for PVR.  Continuous flow. CMG - S1 91 cc H2O.  Unstable CMG with mutliple leaks.  UPP - 12 cm H2O.   Pressure flow - detrusor contractions noted.   Urine: +1 RBC, cloudy  GYNECOLOGIC HISTORY: No LMP recorded. (Menstrual status: Perimenopausal). Contraception:  Tubal ligation Menopausal hormone therapy:  none Last mammogram:  04/13/20 Left Breast Diagnostic - BIRADS 1 negative Last pap smear: 04/22/20 LGSIL:Pos HR HPV   10-22-19 ASCUS:Pos HR HPV   05-02-19 Neg:Neg HR HPV   09-25-18 Neg:Neg HR HPV        OB History    Gravida  1   Para  1   Term      Preterm      AB      Living  1     SAB      TAB      Ectopic      Multiple  1   Live Births                 Patient Active Problem List   Diagnosis Date Noted  . Vitamin D deficiency 05/23/2017  . Anxiety 06/19/2015  . Anemia, iron deficiency 06/19/2015  . Pernicious anemia 06/19/2015  . 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 Protein-calorie malnutrition, severe ([place]) 04/18/2015  . Severe protein-calorie malnutrition ([place]) 04/18/2015  . 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 Bariatric surgery status 04/18/2015  . 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 GERD (gastroesophageal reflux disease) 04/17/2015  . Hypokalemia 04/16/2015  . Migraine with  aura 05/21/2014  . 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 Abnormal Pap smear   . Anemia   . Anxiety   . Broken toe 12-22-15   middle toe right foot  . Cataract of both eyes   . Depression   . GERD (gastroesophageal reflux disease)   . Headache(784.0)   . Heart murmur   . History of kidney stones   . 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 Migraine   . Perforated bowel ([place])   . Pernicious anemia 06/19/2015  . PONV (postoperative nausea and vomiting)   . Thyroid disease    hypothyrodism    Past Surgical History:  Procedure Laterality Date  . CATARACT EXTRACTION, BILATERAL Bilateral 02/2016  . CERVICAL BIOPSY  W/ LOOP ELECTRODE EXCISION  11/2013, 05/2018, 11/2019   LGSIL, negative for dysplasia, LGSIL and positive ECC with potential LGSIL  - respectively  . 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 CESAREAN SECTION  2008  . CHOLECYSTECTOMY    . GASTRIC BYPASS  2002  . HYSTEROSCOPY WITH D & C  10/14/2011   Procedure: DILATATION AND CURETTAGE (D&C) /HYSTEROSCOPY;  Surgeon: Arloa Koh;  Location: [facility];  Service: Gynecology;  Laterality: Bilateral;  . LASIK    . NOSE SURGERY    . STOMACH SURGERY  04/20/2015   dilation of "connection between stomach and intestine", [facility]  . TONSILLECTOMY    . TONSILLECTOMY    . TUBAL LIGATION  2008    Current Outpatient Medications  Medication Sig Dispense Refill  . AIMOVIG 140 MG/ML SOAJ every 30 (thirty) days.    . ALPRAZolam (XANAX) 1 MG tablet Take 1 mg by mouth 2 (two) times daily as needed. Anxiety      . Armodafinil 250 MG tablet Take 250 mg by mouth every morning.  2  . botulinum toxin Type A (BOTOX) 100 units SOLR injection once. Unaware of dose, pt gets q 71mo for migraines    . Botulinum Toxin Type A, Cosm, 100 units SOLR once. Unaware of dose, pt gets q 43mo for migraines    . CONTRAVE 8-90 MG TB12 Take 2  tablets by mouth 2 (two) times daily.    Marland Kitchen DEXILANT 60 MG capsule Take 60 mg by mouth daily.    . diphenhydrAMINE (BENADRYL) 12.5 MG/5ML elixir Take 12.5 mg by mouth every 6 (six) hours as needed.     . divalproex (DEPAKOTE) 500 MG DR tablet Take 1 tablet (500 mg total) by mouth 2 (two) times daily. 180 tablet 4  . doxepin (SINEQUAN) 75 MG capsule 75 mg at bedtime.     Marland Kitchen eletriptan (RELPAX) 40 MG tablet Take 1 tablet (40 mg total) by mouth as needed for migraine or headache. May repeat in 2 hours if needed. Max 2 tabs per day or 8 tabs per month. 10 tablet 12  . ergocalciferol (VITAMIN D2) 50000 UNITS capsule Take 50,000 Units by mouth. She is taking daily M-F - pr pt    . Eszopiclone 3 MG TABS TK 1 T PO QD HS  1  . fexofenadine (ALLEGRA) 180 MG tablet Take 180 mg by mouth daily as needed. allergies     . fluticasone (FLONASE) 50 MCG/ACT nasal spray Place 1 spray into both nostrils as needed.  1  . gabapentin (NEURONTIN) 300 MG capsule Take one pill at night for 1 week.  Increase to 2 pills at night for 1 week.  Increase to 3 pills at night until seen    . hydrOXYzine (ATARAX/VISTARIL) 25 MG tablet TAKE 1 TABLET BY MOUTH AS NEEDED FOR HEADACHE UPTO EVERY 8 HOURS  2  . imipramine (TOFRANIL) 50 MG tablet Take 2 tablets by mouth daily.  0  . lansoprazole (PREVACID) 30 MG capsule Take 30 mg by mouth daily as needed. Acid reflux      . Lasmiditan Succinate 100 MG TABS Take by mouth.    . levothyroxine (SYNTHROID) 50 MCG tablet TAKE 1 TABLET BY MOUTH EVERY DAY 30 MINUTES PRIOR TO EATING 30 tablet 7  . LINZESS 145 MCG CAPS capsule Take 145 mcg by mouth daily.  3  . Multiple Vitamins-Minerals (MULTIVITAMIN WITH MINERALS) tablet Take 1 tablet by mouth daily.      Marland Kitchen nystatin-triamcinolone (MYCOLOG II) cream Apply 1 application topically 2 (two) times daily. Apply to affected area BID for up to 7 days. 60 g 0  . ondansetron (ZOFRAN) 4 MG tablet Take 1 tablet by mouth as needed.    Marland Kitchen OZEMPIC, 0.25 OR 0.5  MG/DOSE, 2 MG/1.5ML SOPN SMARTSIG:0.25 Milligram(s) SUB-Q Once a Week    . promethazine (PHENERGAN) 12.5 MG tablet TAKE 1 TABLET(12.5 MG) BY MOUTH  EVERY 8 HOURS AS NEEDED FOR NAUSEA OR VOMITING 15 tablet 0  . promethazine (PROMETHEGAN) 25 MG suppository SMARTSIG:1 SUPPOS Rectally Every 6 Hours PRN    . ranitidine (ZANTAC) 150 MG capsule     . Semaglutide,0.25 or 0.5MG /DOS, 2 MG/1.5ML SOPN Inject into the skin.    Marland Kitchen thiamine (VITAMIN B-1) 50 MG tablet daily.  0  . tiZANidine (ZANAFLEX) 4 MG capsule Take 2-4 mg by mouth at bedtime.  0  . UBRELVY 50 MG TABS daily.    Marland Kitchen venlafaxine XR (EFFEXOR-XR) 150 MG 24 hr capsule Take 1 capsule by mouth daily.  0  . WELLBUTRIN XL 300 MG 24 hr tablet TK 1 T PO  D QAM  1  . zonisamide (ZONEGRAN) 100 MG capsule Take 1 capsule (100 mg total) by mouth 2 (two) times daily. 180 capsule 4  . potassium chloride SA (K-DUR) 20 MEQ tablet Take 1 tablet (20 mEq total) by mouth daily for 10 days. 10 tablet 0   No current facility-administered medications for this visit.     ALLERGIES: Naproxen, Monistat [miconazole], Sulfa antibiotics, and Tioconazole  Family History  Problem Relation Age of Onset  . Diabetes Mother   . Heart disease Father   . Social phobia Father   . Psychiatric Illness Father   . Hypertension Father   . Stroke Father   . Dementia Father   . Heart disease Other   . Diabetes Other   . Stroke Other   . Hypertension Maternal Grandmother   . Thyroid disease Maternal Grandmother   . Hypertension Paternal Grandmother   . Stroke Paternal Grandfather   . Breast cancer Neg Hx     Social History   Socioeconomic History  . Marital status: Divorced    Spouse name: Not on file  . Number of children: 1  . Years of education: College  . Highest education level: Not on file  Occupational History    Employer: [employer]  Tobacco Use  . Smoking status: Never Smoker  . Smokeless tobacco: Never Used  Vaping Use  . Vaping Use: Never used   Substance and Sexual Activity  . Alcohol use: Yes    Alcohol/week: 0.0 standard drinks    Comment: Once a month-rarely  . Drug use: No  . Sexual activity: Not Currently    Partners: Male    Birth control/protection: Surgical    Comment: tubal  Other Topics Concern  . Not on file  Social History Narrative   Pt lives at home with her family.   Caffeine Use: once daily   Social Determinants of Health   Financial Resource Strain:   . Difficulty of Paying Living Expenses:   Food Insecurity:   . Worried About Charity fundraiser in the Last Year:   . Arboriculturist in the Last Year:   Transportation Needs:   . Film/video editor (Medical):   Marland Kitchen Lack of Transportation (Non-Medical):   Physical Activity:   . Days of Exercise per Week:   . Minutes of Exercise per Session:   Stress:   . Feeling of Stress :   Social Connections:   . Frequency of Communication with Friends and Family:   . Frequency of Social Gatherings with Friends and Family:   . Attends Religious Services:   . Active Member of Clubs or Organizations:   . Attends Archivist Meetings:   Marland Kitchen Marital Status:   Intimate Partner Violence:   . Fear of Current  or Ex-Partner:   . Emotionally Abused:   Marland Kitchen Physically Abused:   . Sexually Abused:     Review of Systems  Constitutional: Negative.   HENT: Negative.   Eyes: Negative.   Respiratory: Negative.   Cardiovascular: Negative.   Gastrointestinal: Negative.   Endocrine: Negative.   Genitourinary: Positive for frequency.  Musculoskeletal: Negative.   Skin: Negative.   Allergic/Immunologic: Negative.   Neurological: Negative.   Hematological: Negative.   Psychiatric/Behavioral: Negative.     PHYSICAL EXAMINATION:    BP 126/80 (BP Location: Right Arm, Patient Position: Sitting, Cuff Size: Large)   Pulse 88   Resp 16   Ht 5' 3.5" (1.613 m)   Wt 201 lb (91.2 kg)   BMI 35.05 kg/m     General appearance: alert, cooperative and appears stated  age   ASSESSMENT  Mixed incontinence by history.  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  Abnormal urine dip.  PLAN  Stress incontinence and overactive bladder reviewed.  We discussed her urodynamic testing and the limits of evaluation give the detrusor instability.  Will send UC and treat for presumed infection. Macrobid 100 po bid x 5 days.  Mybetriq 25 mg daily.  Will do PT after she has been on the Myrbetriq for at least 4 weeks.

## 2020-07-17 NOTE — Patient Instructions (Signed)
Mirabegron extended-release tablets What is this medicine? MIRABEGRON (MIR a BEG ron) is used to treat overactive bladder. This medicine reduces the amount of bathroom visits. It may also help to control wetting accidents. It may be used alone, but sometimes may be given with other treatments. This medicine may be used for other purposes; ask your health care provider or pharmacist if you have questions. COMMON BRAND NAME(S): Myrbetriq What should I tell my health care provider before I take this medicine? They need to know if you have any of these conditions:  high blood pressure  kidney disease  liver disease  problems urinating  prostate disease  an unusual or allergic reaction to mirabegron, other medicines, foods, dyes, or preservatives  pregnant or trying to get pregnant  breast-feeding How should I use this medicine? Take this medicine by mouth with a glass of water. Follow the directions on the prescription label. Do not cut, crush or chew this medicine. You can take it with or without food. If it upsets your stomach, take it with food. Take your medicine at regular intervals. Do not take it more often than directed. Do not stop taking except on your doctor's advice. Talk to your pediatrician regarding the use of this medicine in children. Special care may be needed. Overdosage: If you think you have taken too much of this medicine contact a poison control center or emergency room at once. NOTE: This medicine is only for you. Do not share this medicine with others. What if I miss a dose? If you miss a dose, take it as soon as you can. If it is almost time for your next dose, take only that dose. Do not take double or extra doses. What may interact with this medicine?  codeine  desipramine  digoxin  flecainide  MAOIs like Carbex, Eldepryl, Marplan, Nardil, and Parnate  methadone  metoprolol  pimozide  propafenone  thioridazine  warfarin This list may not  describe all possible interactions. Give your health care provider a list of all the medicines, herbs, non-prescription drugs, or dietary supplements you use. Also tell them if you smoke, drink alcohol, or use illegal drugs. Some items may interact with your medicine. What should I watch for while using this medicine? Visit your doctor or health care professional for regular checks on your progress. Check your blood pressure as directed. Ask your doctor or health care professional what your blood pressure should be and when you should contact him or her. You may need to limit your intake of tea, coffee, caffeinated sodas, or alcohol. These drinks may make your symptoms worse. What side effects may I notice from receiving this medicine? Side effects that you should report to your doctor or health care professional as soon as possible:  allergic reactions like skin rash, itching or hives, swelling of the face, lips, or tongue  high blood pressure  fast, irregular heartbeat  redness, blistering, peeling or loosening of the skin, including inside the mouth  signs of infection like fever or chills; pain or difficulty passing urine  trouble passing urine or change in the amount of urine Side effects that usually do not require medical attention (report to your doctor or health care professional if they continue or are bothersome):  constipation  dry mouth  headache  runny nose  stomach upset This list may not describe all possible side effects. Call your doctor for medical advice about side effects. You may report side effects to FDA at 1-800-FDA-1088. Where should   I keep my medicine? Keep out of the reach of children. Store at room temperature between 15 and 30 degrees C (59 and 86 degrees F). Throw away any unused medicine after the expiration date. NOTE: This sheet is a summary. It may not cover all possible information. If you have questions about this medicine, talk to your doctor,  pharmacist, or health care provider.  2020 Elsevier/Gold Standard (2017-04-13 11:33:21) Overactive Bladder, Adult  Overactive bladder refers to a condition in which a person has a sudden need to pass urine. The person may leak urine if he or she cannot get to the bathroom fast enough (urinary incontinence). A person with this condition may also wake up several times in the night to go to the bathroom. Overactive bladder is associated with poor nerve signals between your bladder and your brain. Your bladder may get the signal to empty before it is full. You may also have very sensitive muscles that make your bladder squeeze too soon. These symptoms might interfere with daily work or social activities. What are the causes? This condition may be associated with or caused by:  Urinary tract infection.  Infection of nearby tissues, such as the prostate.  Prostate enlargement.  Surgery on the uterus or urethra.  Bladder stones, inflammation, or tumors.  Drinking too much caffeine or alcohol.  Certain medicines, especially medicines that get rid of extra fluid in the body (diuretics).  Muscle or nerve weakness, especially from: ? A spinal cord injury. ? Stroke. ? Multiple sclerosis. ? Parkinson's disease.  Diabetes.  Constipation. What increases the risk? You may be at greater risk for overactive bladder if you:  Are an older adult.  Smoke.  Are going through menopause.  Have prostate problems.  Have a neurological disease, such as stroke, dementia, Parkinson's disease, or multiple sclerosis (MS).  Eat or drink things that irritate the bladder. These include alcohol, spicy food, and caffeine.  Are overweight or obese. What are the signs or symptoms? Symptoms of this condition include:  Sudden, strong urge to urinate.  Leaking urine.  Urinating 8 or more times a day.  Waking up to urinate 2 or more times a night. How is this diagnosed? Your health care provider may  suspect overactive bladder based on your symptoms. He or she will diagnose this condition by:  A physical exam and medical history.  Blood or urine tests. You might need bladder or urine tests to help determine what is causing your overactive bladder. You might also need to see a health care provider who specializes in urinary tract problems (urologist). How is this treated? Treatment for overactive bladder depends on the cause of your condition and whether it is mild or severe. You can also make lifestyle changes at home. Options include:  Bladder training. This may include: ? Learning to control the urge to urinate by following a schedule that directs you to urinate at regular intervals (timed voiding). ? Doing Kegel exercises to strengthen your pelvic floor muscles, which support your bladder. Toning these muscles can help you control urination, even if your bladder muscles are overactive.  Special devices. This may include: ? Biofeedback, which uses sensors to help you become aware of your body's signals. ? Electrical stimulation, which uses electrodes placed inside the body (implanted) or outside the body. These electrodes send gentle pulses of electricity to strengthen the nerves or muscles that control the bladder. ? Women may use a plastic device that fits into the vagina and supports the bladder (  pessary).  Medicines. ? Antibiotics to treat bladder infection. ? Antispasmodics to stop the bladder from releasing urine at the wrong time. ? Tricyclic antidepressants to relax bladder muscles. ? Injections of botulinum toxin type A directly into the bladder tissue to relax bladder muscles.  Lifestyle changes. This may include: ? Weight loss. Talk to your health care provider about weight loss methods that would work best for you. ? Diet changes. This may include reducing how much alcohol and caffeine you consume, or drinking fluids at different times of the day. ? Not smoking. Do not  use any products that contain nicotine or tobacco, such as cigarettes and e-cigarettes. If you need help quitting, ask your health care provider.  Surgery. ? A device may be implanted to help manage the nerve signals that control urination. ? An electrode may be implanted to stimulate electrical signals in the bladder. ? A procedure may be done to change the shape of the bladder. This is done only in very severe cases. Follow these instructions at home: Lifestyle  Make any diet or lifestyle changes that are recommended by your health care provider. These may include: ? Drinking less fluid or drinking fluids at different times of the day. ? Cutting down on caffeine or alcohol. ? Doing Kegel exercises. ? Losing weight if needed. ? Eating a healthy and balanced diet to prevent constipation. This may include:  Eating foods that are high in fiber, such as fresh fruits and vegetables, whole grains, and beans.  Limiting foods that are high in fat and processed sugars, such as fried and sweet foods. General instructions  Take over-the-counter and prescription medicines only as told by your health care provider.  If you were prescribed an antibiotic medicine, take it as told by your health care provider. Do not stop taking the antibiotic even if you start to feel better.  Use any implants or pessary as told by your health care provider.  If needed, wear pads to absorb urine leakage.  Keep a journal or log to track how much and when you drink and when you feel the need to urinate. This will help your health care provider monitor your condition.  Keep all follow-up visits as told by your health care provider. This is important. Contact a health care provider if:  You have a fever.  Your symptoms do not get better with treatment.  Your pain and discomfort get worse.  You have more frequent urges to urinate. Get help right away if:  You are not able to control your  bladder. Summary  Overactive bladder refers to a condition in which a person has a sudden need to pass urine.  Several conditions may lead to an overactive bladder.  Treatment for overactive bladder depends on the cause and severity of your condition.  Follow your health care provider's instructions about lifestyle changes, doing Kegel exercises, keeping a journal, and taking medicines. This information is not intended to replace advice given to you by your health care provider. Make sure you discuss any questions you have with your health care provider. Document Revised: 03/14/2019 Document Reviewed: 12/07/2017 Elsevier Patient Education  [place].

## 2020-07-17 NOTE — Telephone Encounter (Signed)
Spoke with pt. Pt given recommendations per Dr Quincy Simmonds. Pt agreeable for OV. OV scheduled for 8/13 at 330 pm. Pt verbalized understanding.  Encounter closed.

## 2020-07-18 LAB — URINALYSIS, MICROSCOPIC ONLY
Casts: NONE SEEN /lpf
Epithelial Cells (non renal): >10 /hpf — AB (ref 0–10)

## 2020-07-19 LAB — URINE CULTURE

## 2020-07-20 ENCOUNTER — Telehealth: Payer: Self-pay | Admitting: Obstetrics and Gynecology

## 2020-07-20 NOTE — Telephone Encounter (Signed)
Patient viewed her results in [place] and has a question. She asked "do I have a UTI".

## 2020-07-20 NOTE — Telephone Encounter (Signed)
Spoke with patient. Patient reviewed 07/17/20 results by MyChart message. Questions answered.   She is starting day 3 of Macrobid today. Reports no change in urinary frequency, voids small amounts. Gets up 2-3 times during the night to void. Denies any new symptoms.  Advised patient I will provide update to Dr. Quincy Simmonds. Continue Macrobid as recommended after abx completed start Myrbetriq. Increase fluid intake. Return call to office if any new symptoms develop or any additional questions. Patient agreeable.   Routing to provider for final review. Patient is agreeable to disposition. Will close encounter.

## 2020-07-20 NOTE — Progress Notes (Deleted)
GYNECOLOGY  VISIT   HPI: 54 y.o.   Divorced  Caucasian  female   G1P1 with No LMP recorded. (Menstrual status: Perimenopausal).   here for urodynamics results.    GYNECOLOGIC HISTORY: No LMP recorded. (Menstrual status: Perimenopausal). Contraception: tubal Menopausal hormone therapy:  none Last mammogram: 04/13/20 Left Breast Diagnostic - BIRADS 1 negative.  04-01-20 3D/Lt.Br.poss.mass;Rt.Br.neg/density B--further studies needed Last pap smear:  04/22/20 LGSIL:Pos HR HPV, 10-22-19 ASCUS:Pos HR HPV,   05-02-19 Neg:Neg HR HPV        OB History    Gravida  1   Para  1   Term      Preterm      AB      Living  1     SAB      TAB      Ectopic      Multiple  1   Live Births                 Patient Active Problem List   Diagnosis Date Noted  . Vitamin D deficiency 05/23/2017  . Anxiety 06/19/2015  . Anemia, iron deficiency 06/19/2015  . Pernicious anemia 06/19/2015  . 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 Protein-calorie malnutrition, severe ([place]) 04/18/2015  . Severe protein-calorie malnutrition ([place]) 04/18/2015  . 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 Bariatric surgery status 04/18/2015  . 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 GERD (gastroesophageal reflux disease) 04/17/2015  . Hypokalemia 04/16/2015  . Migraine with aura 05/21/2014  . 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 Abnormal Pap smear   . Anemia   . Anxiety   . Broken toe 12-22-15   middle toe right foot  . Cataract of both eyes   . Depression   . GERD (gastroesophageal reflux disease)   . Headache(784.0)   . Heart murmur   . History of kidney stones   . 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 Migraine   . Perforated bowel ([place])   . Pernicious anemia 06/19/2015  . PONV (postoperative nausea and vomiting)   . Thyroid disease    hypothyrodism     Past Surgical History:  Procedure Laterality Date  . CATARACT EXTRACTION, BILATERAL Bilateral 02/2016  . CERVICAL BIOPSY  W/ LOOP ELECTRODE EXCISION  11/2013, 05/2018, 11/2019   LGSIL, negative for dysplasia, LGSIL and positive ECC with potential LGSIL  - respectively  . 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 CESAREAN SECTION  2008  . CHOLECYSTECTOMY    . GASTRIC BYPASS  2002  . HYSTEROSCOPY WITH D & C  10/14/2011   Procedure: DILATATION AND CURETTAGE (D&C) /HYSTEROSCOPY;  Surgeon: Arloa Koh;  Location: [facility];  Service: Gynecology;  Laterality: Bilateral;  . LASIK    . NOSE SURGERY    . STOMACH SURGERY  04/20/2015   dilation of "connection between stomach and intestine", [facility]  . TONSILLECTOMY    . TONSILLECTOMY    . TUBAL LIGATION  2008    Current Outpatient Medications  Medication Sig Dispense Refill  . AIMOVIG 140 MG/ML SOAJ every 30 (thirty) days.    . ALPRAZolam (XANAX) 1 MG tablet Take 1 mg by mouth 2 (two) times daily as needed. Anxiety      . Armodafinil 250 MG tablet Take 250 mg by mouth every morning.  2  . botulinum toxin Type A (BOTOX) 100 units SOLR injection once. Unaware  of dose, pt gets q 59mo for migraines    . Botulinum Toxin Type A, Cosm, 100 units SOLR once. Unaware of dose, pt gets q 39mo for migraines    . CONTRAVE 8-90 MG TB12 Take 2 tablets by mouth 2 (two) times daily.    Marland Kitchen DEXILANT 60 MG capsule Take 60 mg by mouth daily.    . diphenhydrAMINE (BENADRYL) 12.5 MG/5ML elixir Take 12.5 mg by mouth every 6 (six) hours as needed.     . divalproex (DEPAKOTE) 500 MG DR tablet Take 1 tablet (500 mg total) by mouth 2 (two) times daily. 180 tablet 4  . doxepin (SINEQUAN) 75 MG capsule 75 mg at bedtime.     Marland Kitchen eletriptan (RELPAX) 40 MG tablet Take 1 tablet (40 mg total) by mouth as needed for migraine or headache. May repeat in 2 hours if needed. Max 2 tabs per day or 8 tabs per month. 10 tablet 12  . ergocalciferol (VITAMIN  D2) 50000 UNITS capsule Take 50,000 Units by mouth. She is taking daily M-F - pr pt    . Eszopiclone 3 MG TABS TK 1 T PO QD HS  1  . fexofenadine (ALLEGRA) 180 MG tablet Take 180 mg by mouth daily as needed. allergies     . fluticasone (FLONASE) 50 MCG/ACT nasal spray Place 1 spray into both nostrils as needed.  1  . gabapentin (NEURONTIN) 300 MG capsule Take one pill at night for 1 week.  Increase to 2 pills at night for 1 week.  Increase to 3 pills at night until seen    . hydrOXYzine (ATARAX/VISTARIL) 25 MG tablet TAKE 1 TABLET BY MOUTH AS NEEDED FOR HEADACHE UPTO EVERY 8 HOURS  2  . imipramine (TOFRANIL) 50 MG tablet Take 2 tablets by mouth daily.  0  . lansoprazole (PREVACID) 30 MG capsule Take 30 mg by mouth daily as needed. Acid reflux      . Lasmiditan Succinate 100 MG TABS Take by mouth.    . levothyroxine (SYNTHROID) 50 MCG tablet TAKE 1 TABLET BY MOUTH EVERY DAY 30 MINUTES PRIOR TO EATING 30 tablet 7  . LINZESS 145 MCG CAPS capsule Take 145 mcg by mouth daily.  3  . mirabegron ER (MYRBETRIQ) 25 MG TB24 tablet Take 1 tablet (25 mg total) by mouth daily. 30 tablet 1  . Multiple Vitamins-Minerals (MULTIVITAMIN WITH MINERALS) tablet Take 1 tablet by mouth daily.      . nitrofurantoin, macrocrystal-monohydrate, (MACROBID) 100 MG capsule Take 1 capsule (100 mg total) by mouth 2 (two) times daily. 10 capsule 0  . nystatin-triamcinolone (MYCOLOG II) cream Apply 1 application topically 2 (two) times daily. Apply to affected area BID for up to 7 days. 60 g 0  . ondansetron (ZOFRAN) 4 MG tablet Take 1 tablet by mouth as needed.    Marland Kitchen OZEMPIC, 0.25 OR 0.5 MG/DOSE, 2 MG/1.5ML SOPN SMARTSIG:0.25 Milligram(s) SUB-Q Once a Week    . potassium chloride SA (K-DUR) 20 MEQ tablet Take 1 tablet (20 mEq total) by mouth daily for 10 days. 10 tablet 0  . promethazine (PHENERGAN) 12.5 MG tablet TAKE 1 TABLET(12.5 MG) BY MOUTH EVERY 8 HOURS AS NEEDED FOR NAUSEA OR VOMITING 15 tablet 0  . promethazine  (PROMETHEGAN) 25 MG suppository SMARTSIG:1 SUPPOS Rectally Every 6 Hours PRN    . ranitidine (ZANTAC) 150 MG capsule     . Semaglutide,0.25 or 0.5MG /DOS, 2 MG/1.5ML SOPN Inject into the skin.    Marland Kitchen thiamine (VITAMIN B-1) 50 MG  tablet daily.  0  . tiZANidine (ZANAFLEX) 4 MG capsule Take 2-4 mg by mouth at bedtime.  0  . UBRELVY 50 MG TABS daily.    Marland Kitchen venlafaxine XR (EFFEXOR-XR) 150 MG 24 hr capsule Take 1 capsule by mouth daily.  0  . WELLBUTRIN XL 300 MG 24 hr tablet TK 1 T PO  D QAM  1  . zonisamide (ZONEGRAN) 100 MG capsule Take 1 capsule (100 mg total) by mouth 2 (two) times daily. 180 capsule 4   No current facility-administered medications for this visit.     ALLERGIES: Naproxen, Monistat [miconazole], Sulfa antibiotics, and Tioconazole  Family History  Problem Relation Age of Onset  . Diabetes Mother   . Heart disease Father   . Social phobia Father   . Psychiatric Illness Father   . Hypertension Father   . Stroke Father   . Dementia Father   . Heart disease Other   . Diabetes Other   . Stroke Other   . Hypertension Maternal Grandmother   . Thyroid disease Maternal Grandmother   . Hypertension Paternal Grandmother   . Stroke Paternal Grandfather   . Breast cancer Neg Hx     Social History   Socioeconomic History  . Marital status: Divorced    Spouse name: Not on file  . Number of children: 1  . Years of education: College  . Highest education level: Not on file  Occupational History    Employer: [employer]  Tobacco Use  . Smoking status: Never Smoker  . Smokeless tobacco: Never Used  Vaping Use  . Vaping Use: Never used  Substance and Sexual Activity  . Alcohol use: Yes    Alcohol/week: 0.0 standard drinks    Comment: Once a month-rarely  . Drug use: No  . Sexual activity: Not Currently    Partners: Male    Birth control/protection: Surgical    Comment: tubal  Other Topics Concern  . Not on file  Social History Narrative   Pt lives at home with  her family.   Caffeine Use: once daily   Social Determinants of Health   Financial Resource Strain:   . Difficulty of Paying Living Expenses:   Food Insecurity:   . Worried About Charity fundraiser in the Last Year:   . Arboriculturist in the Last Year:   Transportation Needs:   . Film/video editor (Medical):   Marland Kitchen Lack of Transportation (Non-Medical):   Physical Activity:   . Days of Exercise per Week:   . Minutes of Exercise per Session:   Stress:   . Feeling of Stress :   Social Connections:   . Frequency of Communication with Friends and Family:   . Frequency of Social Gatherings with Friends and Family:   . Attends Religious Services:   . Active Member of Clubs or Organizations:   . Attends Archivist Meetings:   Marland Kitchen Marital Status:   Intimate Partner Violence:   . Fear of Current or Ex-Partner:   . Emotionally Abused:   Marland Kitchen Physically Abused:   . Sexually Abused:     Review of Systems  PHYSICAL EXAMINATION:    There were no vitals taken for this visit.    General appearance: alert, cooperative and appears stated age Head: Normocephalic, without obvious abnormality, atraumatic Neck: no adenopathy, supple, symmetrical, trachea midline and thyroid normal to inspection and palpation Lungs: clear to auscultation bilaterally Breasts: normal appearance, no masses or tenderness, No nipple  retraction or dimpling, No nipple discharge or bleeding, No axillary or supraclavicular adenopathy Heart: regular rate and rhythm Abdomen: soft, non-tender, no masses,  no organomegaly Extremities: extremities normal, atraumatic, no cyanosis or edema Skin: Skin color, texture, turgor normal. No rashes or lesions Lymph nodes: Cervical, supraclavicular, and axillary nodes normal. No abnormal inguinal nodes palpated Neurologic: Grossly normal  Pelvic: External genitalia:  no lesions              Urethra:  normal appearing urethra with no masses, tenderness or lesions               Bartholins and Skenes: normal                 Vagina: normal appearing vagina with normal color and discharge, no lesions              Cervix: no lesions                Bimanual Exam:  Uterus:  normal size, contour, position, consistency, mobility, non-tender              Adnexa: no mass, fullness, tenderness              Rectal exam: {yes no:[id number]}.  Confirms.              Anus:  normal sphincter tone, no lesions  Chaperone was present for exam.  ASSESSMENT     PLAN     An After Visit Summary was printed and given to the patient.  ______ minutes face to face time of which over 50% was spent in counseling.

## 2020-07-22 ENCOUNTER — Ambulatory Visit: Payer: 59 | Admitting: Obstetrics and Gynecology

## 2020-07-28 ENCOUNTER — Telehealth: Payer: Self-pay

## 2020-07-28 NOTE — Telephone Encounter (Signed)
Patient is calling in regards to discuss possible hysterectomy. Patient states she is having a procedure done at [facility] and would like to know which procedure is best to have done first.

## 2020-07-28 NOTE — Telephone Encounter (Signed)
Spoke with pt. Pt states having endo/colonoscopy scheduled with [facility] healthcare on 08/04/20. After screenings done, pt to schedule elective endoscopic weight loss procedure. Pt wanting to see if can have weight loss procedure and then schedule hysterectomy afterwards within the 4-6 weeks of recovery time of weight loss procedure while she is on liquid diet/meds?   Advised will review with Dr Quincy Simmonds and return call. Pt agreeable.   See notes from [facility] in Care everywhere.   Routing to Dr Quincy Simmonds.

## 2020-07-31 NOTE — Telephone Encounter (Signed)
Would it be possible for her to have an office appointment with me so we can review this further? I am not sure what she will need to expect from her weight loss surgery recovery. We also need to define her bladder needs further.  She recently started on Mybetriq for overactive bladder.

## 2020-07-31 NOTE — Telephone Encounter (Signed)
Spoke with pt. Pt given update and recommendations per Dr Quincy Simmonds. Pt verbalized understanding about recommendations, but would really like to do this while she is out for weight loss procedure due to work leave. Pt also asking why she cant have liquid pain medication for hysterectomy. Pt upset with not being able to do both at same time period. Pt states no solid food for 6-8 weeks after weight loss procedure.   Advised will review with Dr Quincy Simmonds and return call to pt. Pt agreeable.   Routing to Dr Quincy Simmonds.

## 2020-07-31 NOTE — Telephone Encounter (Signed)
I have reviewed this message and the Care Everywhere visit with her GI.   Weight loss would definitely help to facilitate hysterectomy and to reduce urinary incontinence with cough, laugh, sneeze, exercise.   It seems like a lot to do her weight loss procedure and then soon after follow with hysterectomy.  It would be beneficial for her to be able to eat solid food and take pills by mouth when she undergoes the hysterectomy.   If she could space out the two procedures a bit more, this may be to her advantage.

## 2020-08-03 NOTE — Telephone Encounter (Signed)
Spoke with pt. Pt given recommendations per Dr Quincy Simmonds. Pt agreeable to have OV to discuss surgery planning. Pt states will keep OV for 5 week follow on 09/02/20 at 4 pm and to also discuss surgery planning. Pt states cannot come to another OV with Dr Quincy Simmonds due to work schedule and already scheduled appts. Pt states will have more information at [place] on 09/02/20 on endoscopy and weight loss procedure.  Advised will give update to Dr Quincy Simmonds. Pt agreeable.  Pt states doing well with Myrbetriq.   Encounter closed.

## 2020-08-04 ENCOUNTER — Other Ambulatory Visit: Payer: 59

## 2020-08-04 ENCOUNTER — Inpatient Hospital Stay: Payer: 59

## 2020-08-04 ENCOUNTER — Ambulatory Visit: Payer: 59

## 2020-08-05 ENCOUNTER — Inpatient Hospital Stay: Payer: 59 | Attending: Hematology & Oncology | Admitting: Family

## 2020-08-05 ENCOUNTER — Ambulatory Visit: Payer: 59

## 2020-08-06 ENCOUNTER — Ambulatory Visit: Payer: Self-pay | Admitting: Obstetrics and Gynecology

## 2020-08-21 ENCOUNTER — Other Ambulatory Visit: Payer: Self-pay | Admitting: Obstetrics and Gynecology

## 2020-08-24 NOTE — Telephone Encounter (Signed)
Spoke with pt. Pt states unsure when took Synthroid Rx last. Pt states does not have follow up with Dr Chalmers Cater or Dr Antonieta Pert office.  Last TSH 3.640, T3, 3.0 T4: 0.91 in Jan 2021 by Dr Quincy Simmonds.   Pt has surgery consult with Dr Quincy Simmonds on 09/02/20. Advised will review Synthroid Rx with Dr Quincy Simmonds and return call with any further recommendations. Pt agreeable.   Routing to Dr Quincy Simmonds, please advise. Pharmacy verified. Rx pended # 30, 0RF. Last Rx ordered 08/25/19 by Dr Quincy Simmonds.

## 2020-08-24 NOTE — Telephone Encounter (Signed)
Please check with the patient to see if her endocrinologist, Dr. Chalmers Cater, is following her hypothyroidism now and prescribing her Synthroid.

## 2020-08-24 NOTE — Telephone Encounter (Signed)
Medication refill request: Synthroid  Last AEX:  04-22-20 BS  Next AEX: 04-26-21 Last MMG (if hormonal medication request): 04-13-20 density B/biRADS 1 negative  Refill authorized: Today, please advise.   Medication pended for #30, 0RF. Please refill if appropriate.

## 2020-08-25 NOTE — Telephone Encounter (Signed)
Spoke with pt. Pt updated on Rx. Pt agreeable to start taking 9/22 in AM before breakfast.  Encounter closed.

## 2020-08-25 NOTE — Telephone Encounter (Signed)
Please have patient restart her Synthroid.  [place]aguate for one month refill.

## 2020-09-02 ENCOUNTER — Ambulatory Visit (INDEPENDENT_AMBULATORY_CARE_PROVIDER_SITE_OTHER): Payer: 59 | Admitting: Obstetrics and Gynecology

## 2020-09-02 ENCOUNTER — Encounter: Payer: Self-pay | Admitting: Obstetrics and Gynecology

## 2020-09-02 ENCOUNTER — Other Ambulatory Visit: Payer: Self-pay

## 2020-09-02 ENCOUNTER — Other Ambulatory Visit (HOSPITAL_COMMUNITY)
Admission: RE | Admit: 2020-09-02 | Discharge: 2020-09-02 | Disposition: A | Discharge status: Home / Self Care | Payer: 59 | Source: Ambulatory Visit | Attending: Obstetrics and Gynecology | Admitting: Obstetrics and Gynecology

## 2020-09-02 VITALS — BP 120/88 | HR 70 | Resp 14 | Ht 63.5 in | Wt 203.2 lb

## 2020-09-02 DIAGNOSIS — N760 Acute vaginitis: Secondary | ICD-10-CM | POA: Diagnosis not present

## 2020-09-02 DIAGNOSIS — Z87410 Personal history of cervical dysplasia: Secondary | ICD-10-CM

## 2020-09-02 DIAGNOSIS — E039 Hypothyroidism, unspecified: Secondary | ICD-10-CM

## 2020-09-02 DIAGNOSIS — N3281 Overactive bladder: Secondary | ICD-10-CM | POA: Diagnosis not present

## 2020-09-02 NOTE — Progress Notes (Signed)
GYNECOLOGY  VISIT   HPI: 54 y.o.   Divorced  Caucasian  female   G1P1 with No LMP recorded. (Menstrual status: Perimenopausal).   here for 5 week medication follow up for overactive bladder confirmed with urodynamic testing.     On Myrbetriq 25 mg daily.  Having only occasional leakage episodes.  Leaking a few times a week and wears a pad just in case.  She is having some nasal sinus congestion since starting the medication.  If she has several sneezes in a row, she may leak urine.  She does not leak if she laughs, coughs.  Not exercising other than walking.  No leakage with lifting.   She had a normal endoscopy and colonoscopy at [facility]. Has a small hiatal hernia.  She will not have additional weight loss surgery.  When she had surgery for her stricture of her weight loss repair, there was a perforation of her intestine intraoperatively, and it was repaired.   She was cleared to have her hysterectomy for recurrent cervical dysplasia.  She does request liquid pain medication for post op pain.   Has brown vaginal discharge. No itching or burning.  No change in partners.  She has been off the Synthroid for a couple of months.   GYNECOLOGIC HISTORY: No LMP recorded. (Menstrual status: Perimenopausal). Contraception: Tubal Menopausal hormone therapy:  none Last mammogram: 04-01-20  3D/Lt.Br.poss.mass;Rt.Br.Neg--Lt.Br.Diag was Neg/density B/BiRads1 Last pap smear:  04/22/20 LGSIL:Pos HR HPV                         10-22-19 ASCUS:Pos HR HPV                         05-02-19 Neg:Neg HR HPV                         09-25-18 Neg:Neg HR HPV        OB History    Gravida  1   Para  1   Term      Preterm      AB      Living  1     SAB      TAB      Ectopic      Multiple  1   Live Births                 Patient Active Problem List   Diagnosis Date Noted  . Vitamin D deficiency 05/23/2017  . Anxiety 06/19/2015  . Anemia, iron deficiency 06/19/2015  . Pernicious  anemia 06/19/2015  . 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 Protein-calorie malnutrition, severe ([place]) 04/18/2015  . Severe protein-calorie malnutrition ([place]) 04/18/2015  . 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 Bariatric surgery status 04/18/2015  . 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 GERD (gastroesophageal reflux disease) 04/17/2015  . Hypokalemia 04/16/2015  . Migraine with aura 05/21/2014  . 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 Abnormal Pap smear   . Anemia   . Anxiety   . Broken toe 12-22-15   middle toe right foot  . Cataract of both eyes   . Depression   . GERD (gastroesophageal reflux disease)   . Headache(784.0)   . Heart murmur   . History of kidney stones   . 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 Migraine   . Perforated bowel (  [place])   . Pernicious anemia 06/19/2015  . PONV (postoperative nausea and vomiting)   . Thyroid disease    hypothyrodism    Past Surgical History:  Procedure Laterality Date  . CATARACT EXTRACTION, BILATERAL Bilateral 02/2016  . CERVICAL BIOPSY  W/ LOOP ELECTRODE EXCISION  11/2013, 05/2018, 11/2019   LGSIL, negative for dysplasia, LGSIL and positive ECC with potential LGSIL  - respectively  . 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 CESAREAN SECTION  2008  . CHOLECYSTECTOMY    . GASTRIC BYPASS  2002  . HYSTEROSCOPY WITH D & C  10/14/2011   Procedure: DILATATION AND CURETTAGE (D&C) /HYSTEROSCOPY;  Surgeon: Arloa Koh;  Location: [facility];  Service: Gynecology;  Laterality: Bilateral;  . LASIK    . NOSE SURGERY    . STOMACH SURGERY  04/20/2015   dilation of "connection between stomach and intestine", [facility]  . TONSILLECTOMY    . TONSILLECTOMY    . TUBAL LIGATION  2008    Current Outpatient Medications  Medication Sig Dispense Refill  . AIMOVIG 140 MG/ML  SOAJ every 30 (thirty) days.    . ALPRAZolam (XANAX) 1 MG tablet Take 1 mg by mouth 2 (two) times daily as needed. Anxiety      . Armodafinil 250 MG tablet Take 250 mg by mouth every morning.  2  . baclofen (LIORESAL) 10 MG tablet Take by mouth.    . botulinum toxin Type A (BOTOX) 100 units SOLR injection once. Unaware of dose, pt gets q 39mo for migraines    . Botulinum Toxin Type A, Cosm, 100 units SOLR once. Unaware of dose, pt gets q 31mo for migraines    . CONTRAVE 8-90 MG TB12 Take 2 tablets by mouth 2 (two) times daily.    . cyanocobalamin (,VITAMIN B-12,) 1000 MCG/ML injection every twenty-one (21) days.    Marland Kitchen DEXILANT 60 MG capsule Take 60 mg by mouth daily.    . diphenhydrAMINE (BENADRYL) 12.5 MG/5ML elixir Take 12.5 mg by mouth every 6 (six) hours as needed.     . divalproex (DEPAKOTE) 500 MG DR tablet Take 1 tablet (500 mg total) by mouth 2 (two) times daily. 180 tablet 4  . doxepin (SINEQUAN) 75 MG capsule 75 mg at bedtime.     Marland Kitchen eletriptan (RELPAX) 40 MG tablet Take 1 tablet (40 mg total) by mouth as needed for migraine or headache. May repeat in 2 hours if needed. Max 2 tabs per day or 8 tabs per month. 10 tablet 12  . ergocalciferol (VITAMIN D2) 50000 UNITS capsule Take 50,000 Units by mouth. She is taking daily M-F - pr pt    . Eszopiclone 3 MG TABS TK 1 T PO QD HS  1  . fexofenadine (ALLEGRA) 180 MG tablet Take 180 mg by mouth daily as needed. allergies     . fluticasone (FLONASE) 50 MCG/ACT nasal spray Place 1 spray into both nostrils as needed.  1  . gabapentin (NEURONTIN) 300 MG capsule Take one pill at night for 1 week.  Increase to 2 pills at night for 1 week.  Increase to 3 pills at night until seen    . hydrOXYzine (ATARAX/VISTARIL) 25 MG tablet TAKE 1 TABLET BY MOUTH AS NEEDED FOR HEADACHE UPTO EVERY 8 HOURS  2  . imipramine (TOFRANIL) 50 MG tablet Take 2 tablets by mouth daily.  0  . lansoprazole (PREVACID) 30 MG capsule Take 30 mg by mouth daily as  needed. Acid reflux       . Lasmiditan Succinate 100 MG TABS Take by mouth.    . levothyroxine (SYNTHROID) 50 MCG tablet TAKE 1 TABLET BY MOUTH EVERY DAY 30 MINUTES BEFORE EATING 30 tablet 0  . LINZESS 145 MCG CAPS capsule Take 145 mcg by mouth daily.  3  . mirabegron ER (MYRBETRIQ) 25 MG TB24 tablet Take 1 tablet (25 mg total) by mouth daily. 30 tablet 1  . Multiple Vitamins-Minerals (MULTIVITAMIN WITH MINERALS) tablet Take 1 tablet by mouth daily.      Marland Kitchen nystatin (MYCOSTATIN/NYSTOP) powder once as needed.    . nystatin-triamcinolone (MYCOLOG II) cream Apply 1 application topically 2 (two) times daily. Apply to affected area BID for up to 7 days. 60 g 0  . omeprazole (PRILOSEC) 20 MG capsule Take by mouth.    . ondansetron (ZOFRAN) 4 MG tablet Take 1 tablet by mouth as needed.    Marland Kitchen OZEMPIC, 0.25 OR 0.5 MG/DOSE, 2 MG/1.5ML SOPN SMARTSIG:0.25 Milligram(s) SUB-Q Once a Week    . promethazine (PHENERGAN) 12.5 MG tablet TAKE 1 TABLET(12.5 MG) BY MOUTH EVERY 8 HOURS AS NEEDED FOR NAUSEA OR VOMITING 15 tablet 0  . promethazine (PROMETHEGAN) 25 MG suppository SMARTSIG:1 SUPPOS Rectally Every 6 Hours PRN    . ranitidine (ZANTAC) 150 MG capsule     . Semaglutide,0.25 or 0.5MG /DOS, 2 MG/1.5ML SOPN Inject into the skin.    Marland Kitchen thiamine (VITAMIN B-1) 50 MG tablet daily.  0  . tiZANidine (ZANAFLEX) 4 MG capsule Take 2-4 mg by mouth at bedtime.  0  . UBRELVY 50 MG TABS daily.    Marland Kitchen venlafaxine XR (EFFEXOR-XR) 150 MG 24 hr capsule Take 1 capsule by mouth daily.  0  . WELLBUTRIN XL 300 MG 24 hr tablet TK 1 T PO  D QAM  1  . zonisamide (ZONEGRAN) 100 MG capsule Take 1 capsule (100 mg total) by mouth 2 (two) times daily. 180 capsule 4  . potassium chloride SA (K-DUR) 20 MEQ tablet Take 1 tablet (20 mEq total) by mouth daily for 10 days. 10 tablet 0   No current facility-administered medications for this visit.     ALLERGIES: Naproxen, Monistat [miconazole], Sulfa antibiotics, and Tioconazole  Family History  Problem Relation Age  of Onset  . Diabetes Mother   . Heart disease Father   . Social phobia Father   . Psychiatric Illness Father   . Hypertension Father   . Stroke Father   . Dementia Father   . Heart disease Other   . Diabetes Other   . Stroke Other   . Hypertension Maternal Grandmother   . Thyroid disease Maternal Grandmother   . Hypertension Paternal Grandmother   . Stroke Paternal Grandfather   . Breast cancer Neg Hx     Social History   Socioeconomic History  . Marital status: Divorced    Spouse name: Not on file  . Number of children: 1  . Years of education: College  . Highest education level: Not on file  Occupational History    Employer: [employer]  Tobacco Use  . Smoking status: Never Smoker  . Smokeless tobacco: Never Used  Vaping Use  . Vaping Use: Never used  Substance and Sexual Activity  . Alcohol use: Yes    Alcohol/week: 0.0 standard drinks    Comment: Once a month-rarely  . Drug use: No  . Sexual activity: Not Currently    Partners: Male    Birth control/protection: Surgical  Comment: tubal  Other Topics Concern  . Not on file  Social History Narrative   Pt lives at home with her family.   Caffeine Use: once daily   Social Determinants of Health   Financial Resource Strain:   . Difficulty of Paying Living Expenses: Not on file  Food Insecurity:   . Worried About Charity fundraiser in the Last Year: Not on file  . Ran Out of Food in the Last Year: Not on file  Transportation Needs:   . Lack of Transportation (Medical): Not on file  . Lack of Transportation (Non-Medical): Not on file  Physical Activity:   . Days of Exercise per Week: Not on file  . Minutes of Exercise per Session: Not on file  Stress:   . Feeling of Stress : Not on file  Social Connections:   . Frequency of Communication with Friends and Family: Not on file  . Frequency of Social Gatherings with Friends and Family: Not on file  . Attends Religious Services: Not on file  .  Active Member of Clubs or Organizations: Not on file  . Attends Archivist Meetings: Not on file  . Marital Status: Not on file  Intimate Partner Violence:   . Fear of Current or Ex-Partner: Not on file  . Emotionally Abused: Not on file  . Physically Abused: Not on file  . Sexually Abused: Not on file    Review of Systems  Genitourinary: Positive for vaginal discharge.       "brownish" vaginal discharge  All other systems reviewed and are negative.   PHYSICAL EXAMINATION:    BP 120/88 (BP Location: Right Arm, Patient Position: Sitting, Cuff Size: Large)   Pulse 70   Resp 14   Ht 5' 3.5" (1.613 m)   Wt 203 lb 4 oz (92.2 kg)   BMI 35.44 kg/m     General appearance: alert, cooperative and appears stated age  Pelvic: External genitalia:  no lesions              Urethra:  normal appearing urethra with no masses, tenderness or lesions              Bartholins and Skenes: normal                 Vagina: normal appearing vagina with normal color and discharge, no lesions              Cervix: no lesions                Bimanual Exam:  Uterus:  normal size, contour, position, consistency, mobility, non-tender              Adnexa: no mass, fullness, tenderness           Chaperone was present for exam.  ASSESSMENT  Hx recurrent cervical dysplasia and is status post LEEP procedures.  Overactive bladder.  Good response to Myrbetriq.  Mild genuine stress incontinence.  Leaks only with repetitive sneeze. Vaginitis.  Hypothyroidism.  Off Synthroid until just recently.   PLAN  Continue Myrbetriq 25 mg.   We discussed sinusitis as a side effect of this medication.  Will check thyroid function today.  Anticipate continued use of Synthroid.  Affirm vaginitis testing.  Will proceed forward with precert and scheduling of total laparoscopic hysterectomy with bilateral salpingectomy, possible bilateral oophorectomy.  Plan for liquid pain medication.

## 2020-09-03 ENCOUNTER — Telehealth: Payer: Self-pay | Admitting: Obstetrics and Gynecology

## 2020-09-03 LAB — T4, FREE: Free T4: 1.04 ng/dL (ref 0.82–1.77)

## 2020-09-03 LAB — TSH: TSH: 2.66 u[IU]/mL (ref 0.450–4.500)

## 2020-09-03 LAB — T3, FREE: T3, Free: 2.5 pg/mL (ref 2.0–4.4)

## 2020-09-03 NOTE — Telephone Encounter (Signed)
Please precert and schedule total laparoscopic hysterectomy with bilateral salpingectomy, possible bilateral oophorectomy, cystoscopy for recurrent cervical dysplasia.  She has had multiple LEEPs for cervical dysplasia and unsatisfactory colposcopies.

## 2020-09-04 ENCOUNTER — Telehealth: Payer: Self-pay

## 2020-09-04 LAB — CERVICOVAGINAL ANCILLARY ONLY
Bacterial Vaginitis (gardnerella): POSITIVE — AB
Candida Glabrata: NEGATIVE
Candida Vaginitis: POSITIVE — AB
Comment: NEGATIVE
Comment: NEGATIVE
Comment: NEGATIVE
Comment: NEGATIVE
Trichomonas: NEGATIVE

## 2020-09-04 MED ORDER — LEVOTHYROXINE SODIUM 50 MCG PO TABS: ORAL_TABLET | ORAL | 0 refills | Status: DC

## 2020-09-04 NOTE — Telephone Encounter (Signed)
-----   Message from Nunzio Cobbs, MD sent at 09/03/2020  2:27 PM EDT ----- Please contact patient in follow up to her thyroid hormone testing.  Her thyroid levels are currently in a normal range.  I recommend she continue on her current Synthroid 50 mcg daily.  I recommend she retest again in 3 months.  Please make an appointment for a lab visit at our office.

## 2020-09-04 NOTE — Telephone Encounter (Signed)
Spoke with pt. Pt given results and recommendations per Dr Quincy Simmonds. Pt agreeable and verbalized understanding. Lab scheduled for 12/11/2020 at 345 pm.  Pt asking for more refills to get to Jan lab appt. Pharmacy verified. Rx sent # 90, 0RF.  Routing to Dr Quincy Simmonds for review. Encounter closed.

## 2020-09-07 ENCOUNTER — Other Ambulatory Visit: Payer: Self-pay | Admitting: *Deleted

## 2020-09-07 MED ORDER — METRONIDAZOLE 500 MG PO TABS: 500.0000 mg | ORAL_TABLET | Freq: Two times a day (BID) | ORAL | 0 refills | Status: DC

## 2020-09-07 MED ORDER — FLUCONAZOLE 150 MG PO TABS: 150.0000 mg | ORAL_TABLET | Freq: Once | ORAL | 0 refills | Status: DC

## 2020-09-07 MED ORDER — FLUCONAZOLE 150 MG PO TABS: ORAL_TABLET | ORAL | 0 refills | Status: DC

## 2020-09-07 NOTE — Addendum Note (Signed)
Addended by: Burnice Logan on: 09/07/2020 02:14 PM   Modules accepted: Orders

## 2020-09-08 NOTE — Telephone Encounter (Signed)
Spoke with patient regarding surgery benefits. Patient acknowledges understanding of information presented. Patient is aware that benefits presented are for professional benefits only. Patient is aware that once surgery is scheduled, the hospital will call with separate benefits. Patient is aware of surgery cancellation policy.  Patient stated that she has a wedding to attend on November 13th and November 20th. Patient would like to have surgery after those dates. Patient stated that she would check her calendar regarding November 22nd, 23rd, 29th and 30th and return call.  Patient aware of return call from [facility] regarding surgery scheduling, pre op and post op visit scheduling and surgery expectations.

## 2020-09-09 NOTE — Telephone Encounter (Signed)
Spoke with patient. TLH w bilateral salpingectomy/possible bilateral oophorectomy/cysto scheduled for 10/26/2020 at 0730 at [facility]. Pre op scheduled for 10/07/2020 at 9:30 am with Dr.Silva. COVID test scheduled for 10/22/2020 at 12:15 pm at [facility] location. Patient is aware of the need to quarantine after test until surgery. 1 week post op scheduled for 11/05/2020 at 11:30 am with Dr.Silva. 6 week post op scheduled for 12/07/2020 at 4 pm with Dr.Silva. Patient is agreeable. Surgery instructions reviewed and to be given at patient's pre op appointment. Patient verbalizes understanding.  Routing to provider and will close encounter.

## 2020-09-09 NOTE — Telephone Encounter (Signed)
Patient returned call. 10/26/2020 is good for surgery.

## 2020-09-15 ENCOUNTER — Other Ambulatory Visit: Payer: Self-pay | Admitting: Obstetrics and Gynecology

## 2020-09-16 NOTE — Telephone Encounter (Signed)
Medication refill request: Myrbetriq  Last AEX:  04-22-20 BS Next AEX: 04-26-21 Last MMG (if hormonal medication request): n/a Refill authorized: Today, please advise.   Medication pended for #30, 1RF. Please refill if appropriate.

## 2020-09-29 ENCOUNTER — Telehealth: Payer: Self-pay

## 2020-09-29 NOTE — Telephone Encounter (Signed)
Patient is calling to check status of FMLA paper work for surgery. Patient stated if they were finished she could pick them up today.

## 2020-09-30 NOTE — Telephone Encounter (Signed)
To date, FMLA paperwork not received by our office. Voicemail left for patient.

## 2020-10-02 NOTE — Telephone Encounter (Signed)
Return call from patient. Patient stated that someone from [place] would be faxing over [facility] paperwork.

## 2020-10-05 ENCOUNTER — Other Ambulatory Visit: Payer: Self-pay | Admitting: Obstetrics and Gynecology

## 2020-10-05 HISTORY — PX: COLONOSCOPY: SHX174

## 2020-10-05 NOTE — Progress Notes (Signed)
GYNECOLOGY  VISIT   HPI: 54 y.o.   Divorced  Caucasian  female   G1P1 with No LMP recorded. (Menstrual status: Perimenopausal).   here for surgical consult.   Patient is interested in hysterectomy for recurrent cervical dysplasia.  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  Her colposcopies do not allow for visualization of her transformation zone, and this is prompted recent LEEPs.  Her last LEEP on 11/18/19 showed LGSIL with a benign endocervical pass and minute atypical squamous cells and benign endocervical mucosa on her ECC.  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 No further colposcopy or LEEP was performed.   Patient complaining of urinary urgency. She states this is nothing new for her. Taking Myrbetriq.   She had BV and yeast 09/02/20 and both were treated.   She restarted her Synthroid generic 50 mcg daily after her TFTs were checked on 09/02/20.   Had a period a couple of weeks ago for the first time in 1.5 years.  Prior LMP was 03/09/2019. Bleeding started 09/27/20 and this has stopped. FSH 64.7 and E2 77.1 on 09/25/20.  Urine dip:  Trace RBCs.  GYNECOLOGIC HISTORY: No LMP recorded. (Menstrual status: Perimenopausal). Contraception: tubal Menopausal hormone therapy:  none Last mammogram:  04-01-20  3D/Lt.Br.poss.mass;Rt.Br.Neg--Lt.Br.Diag was Neg/density B/BiRads1 Last pap smear: 04/22/20 LGSIL:Pos HR HPV 10-22-19 ASCUS:Pos HRHPV 05-02-19 Neg:Neg HR HPV 09-25-18 Neg:Neg HR HPV        OB History    Gravida  1   Para  1   Term      Preterm      AB      Living  1     SAB      TAB      Ectopic      Multiple  1   Live Births                 Patient Active Problem List   Diagnosis Date Noted  . Vitamin D deficiency 05/23/2017  . Anxiety 06/19/2015  . Anemia, iron deficiency 06/19/2015  . Pernicious anemia 06/19/2015  .  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 Protein-calorie malnutrition, severe ([place]) 04/18/2015  . Severe protein-calorie malnutrition ([place]) 04/18/2015  . 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 Bariatric surgery status 04/18/2015  . 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 GERD (gastroesophageal reflux disease) 04/17/2015  . Hypokalemia 04/16/2015  . Migraine with aura 05/21/2014  . 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 Abnormal Pap smear   . Anemia   . Anxiety   . Broken toe 12-22-15   middle toe right foot  . Cataract of both eyes   . Depression   . GERD (gastroesophageal reflux disease)   . Headache(784.0)   . Heart murmur   . History of kidney stones   . 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 Migraine   . Perforated bowel ([place])   . Pernicious anemia 06/19/2015  . PONV (postoperative nausea and vomiting)   . Thyroid disease    hypothyrodism    Past Surgical History:  Procedure Laterality Date  . CATARACT EXTRACTION, BILATERAL Bilateral 02/2016  . CERVICAL BIOPSY  W/ LOOP ELECTRODE EXCISION  11/2013, 05/2018, 11/2019   LGSIL, negative for dysplasia, LGSIL and positive ECC with potential LGSIL  - respectively  . CERVIX LESION DESTRUCTION  1993   CIN III, [facility],  recurrence--YAG laser  . CESAREAN SECTION  2008  . CHOLECYSTECTOMY    . GASTRIC BYPASS  2002  . HYSTEROSCOPY WITH D & C  10/14/2011   Procedure: DILATATION AND CURETTAGE (D&C) /HYSTEROSCOPY;  Surgeon: Arloa Koh;  Location: [facility];  Service: Gynecology;  Laterality: Bilateral;  . LASIK    . NOSE SURGERY    . STOMACH SURGERY  04/20/2015   dilation of "connection between stomach and intestine", [facility]  . TONSILLECTOMY    . TONSILLECTOMY    . TUBAL LIGATION  2008    Current Outpatient Medications  Medication Sig Dispense Refill  . AIMOVIG 140 MG/ML SOAJ every 30 (thirty)  days.    . ALPRAZolam (XANAX) 1 MG tablet Take 1 mg by mouth 2 (two) times daily as needed. Anxiety      . Armodafinil 250 MG tablet Take 250 mg by mouth every morning.  2  . baclofen (LIORESAL) 10 MG tablet Take by mouth.    . botulinum toxin Type A (BOTOX) 100 units SOLR injection once. Unaware of dose, pt gets q 71mo for migraines    . Botulinum Toxin Type A, Cosm, 100 units SOLR once. Unaware of dose, pt gets q 42mo for migraines    . CONTRAVE 8-90 MG TB12 Take 2 tablets by mouth 2 (two) times daily.    . cyanocobalamin (,VITAMIN B-12,) 1000 MCG/ML injection every twenty-one (21) days.    Marland Kitchen DEXILANT 60 MG capsule Take 60 mg by mouth daily.    . diphenhydrAMINE (BENADRYL) 12.5 MG/5ML elixir Take 12.5 mg by mouth every 6 (six) hours as needed.     . divalproex (DEPAKOTE) 500 MG DR tablet Take 1 tablet (500 mg total) by mouth 2 (two) times daily. 180 tablet 4  . doxepin (SINEQUAN) 75 MG capsule 75 mg at bedtime.     Marland Kitchen eletriptan (RELPAX) 40 MG tablet Take 1 tablet (40 mg total) by mouth as needed for migraine or headache. May repeat in 2 hours if needed. Max 2 tabs per day or 8 tabs per month. 10 tablet 12  . ergocalciferol (VITAMIN D2) 50000 UNITS capsule Take 50,000 Units by mouth. She is taking daily M-F - pr pt    . Eszopiclone 3 MG TABS TK 1 T PO QD HS  1  . fexofenadine (ALLEGRA) 180 MG tablet Take 180 mg by mouth daily as needed. allergies     . fluticasone (FLONASE) 50 MCG/ACT nasal spray Place 1 spray into both nostrils as needed.  1  . gabapentin (NEURONTIN) 300 MG capsule Take one pill at night for 1 week.  Increase to 2 pills at night for 1 week.  Increase to 3 pills at night until seen    . hydrOXYzine (ATARAX/VISTARIL) 25 MG tablet TAKE 1 TABLET BY MOUTH AS NEEDED FOR HEADACHE UPTO EVERY 8 HOURS  2  . imipramine (TOFRANIL) 50 MG tablet Take 2 tablets by mouth daily.  0  . lansoprazole (PREVACID) 30 MG capsule Take 30 mg by mouth daily as needed. Acid reflux      . Lasmiditan  Succinate 100 MG TABS Take by mouth.    . levothyroxine (SYNTHROID) 50 MCG tablet TAKE 1 TABLET BY MOUTH EVERY DAY 30 MINUTES BEFORE EATING 90 tablet 0  . LINZESS 145 MCG CAPS capsule Take 145 mcg by mouth daily.  3  . Multiple Vitamins-Minerals (MULTIVITAMIN WITH MINERALS) tablet Take 1 tablet by mouth daily.      Marland Kitchen MYRBETRIQ 25 MG TB24 tablet  TAKE 1 TABLET(25 MG) BY MOUTH DAILY 30 tablet 6  . nystatin (MYCOSTATIN/NYSTOP) powder once as needed.    . nystatin-triamcinolone (MYCOLOG II) cream Apply 1 application topically 2 (two) times daily. Apply to affected area BID for up to 7 days. 60 g 0  . omeprazole (PRILOSEC) 20 MG capsule Take by mouth.    . ondansetron (ZOFRAN) 4 MG tablet Take 1 tablet by mouth as needed.    Marland Kitchen OZEMPIC, 0.25 OR 0.5 MG/DOSE, 2 MG/1.5ML SOPN SMARTSIG:0.25 Milligram(s) SUB-Q Once a Week    . promethazine (PHENERGAN) 12.5 MG tablet TAKE 1 TABLET(12.5 MG) BY MOUTH EVERY 8 HOURS AS NEEDED FOR NAUSEA OR VOMITING 15 tablet 0  . promethazine (PROMETHEGAN) 25 MG suppository SMARTSIG:1 SUPPOS Rectally Every 6 Hours PRN    . ranitidine (ZANTAC) 150 MG capsule     . Semaglutide,0.25 or 0.5MG /DOS, 2 MG/1.5ML SOPN Inject into the skin.    Marland Kitchen thiamine (VITAMIN B-1) 50 MG tablet daily.  0  . tiZANidine (ZANAFLEX) 4 MG capsule Take 2-4 mg by mouth at bedtime.  0  . UBRELVY 50 MG TABS daily.    Marland Kitchen venlafaxine XR (EFFEXOR-XR) 150 MG 24 hr capsule Take 1 capsule by mouth daily.  0  . WELLBUTRIN XL 300 MG 24 hr tablet TK 1 T PO  D QAM  1  . zonisamide (ZONEGRAN) 50 MG capsule Take 50 mg by mouth at bedtime.    . potassium chloride SA (K-DUR) 20 MEQ tablet Take 1 tablet (20 mEq total) by mouth daily for 10 days. 10 tablet 0   No current facility-administered medications for this visit.     ALLERGIES: Naproxen, Monistat [miconazole], Sulfa antibiotics, and Tioconazole  Family History  Problem Relation Age of Onset  . Diabetes Mother   . Heart disease Father   . Social phobia Father    . Psychiatric Illness Father   . Hypertension Father   . Stroke Father   . Dementia Father   . Heart disease Other   . Diabetes Other   . Stroke Other   . Hypertension Maternal Grandmother   . Thyroid disease Maternal Grandmother   . Hypertension Paternal Grandmother   . Stroke Paternal Grandfather   . Breast cancer Neg Hx     Social History   Socioeconomic History  . Marital status: Divorced    Spouse name: Not on file  . Number of children: 1  . Years of education: College  . Highest education level: Not on file  Occupational History    Employer: [employer]  Tobacco Use  . Smoking status: Never Smoker  . Smokeless tobacco: Never Used  Vaping Use  . Vaping Use: Never used  Substance and Sexual Activity  . Alcohol use: Yes    Alcohol/week: 0.0 standard drinks    Comment: Once a month-rarely  . Drug use: No  . Sexual activity: Not Currently    Partners: Male    Birth control/protection: Surgical    Comment: tubal  Other Topics Concern  . Not on file  Social History Narrative   Pt lives at home with her family.   Caffeine Use: once daily   Social Determinants of Health   Financial Resource Strain:   . Difficulty of Paying Living Expenses: Not on file  Food Insecurity:   . Worried About Charity fundraiser in the Last Year: Not on file  . Ran Out of Food in the Last Year: Not on file  Transportation Needs:   .  Lack of Transportation (Medical): Not on file  . Lack of Transportation (Non-Medical): Not on file  Physical Activity:   . Days of Exercise per Week: Not on file  . Minutes of Exercise per Session: Not on file  Stress:   . Feeling of Stress : Not on file  Social Connections:   . Frequency of Communication with Friends and Family: Not on file  . Frequency of Social Gatherings with Friends and Family: Not on file  . Attends Religious Services: Not on file  . Active Member of Clubs or Organizations: Not on file  . Attends Archivist  Meetings: Not on file  . Marital Status: Not on file  Intimate Partner Violence:   . Fear of Current or Ex-Partner: Not on file  . Emotionally Abused: Not on file  . Physically Abused: Not on file  . Sexually Abused: Not on file    Review of Systems  Genitourinary: Positive for urgency.  All other systems reviewed and are negative.   PHYSICAL EXAMINATION:    BP 120/82   Pulse (!) 105   Ht 5\' 3"  (1.6 m)   Wt 201 lb (91.2 kg)   SpO2 99%   BMI 35.61 kg/m     General appearance: alert, cooperative and appears stated age Head: Normocephalic, without obvious abnormality, atraumatic Neck: no adenopathy, supple, symmetrical, trachea midline and thyroid normal to inspection and palpation Lungs: clear to auscultation bilaterally Breasts: normal appearance, no masses or tenderness, No nipple retraction or dimpling, No nipple discharge or bleeding, No axillary or supraclavicular adenopathy Heart: regular rate and rhythm Abdomen: pannus with erythema under skin fold, soft, non-tender, no masses,  no organomegaly Extremities: extremities normal, atraumatic, no cyanosis or edema Skin: Skin color, texture, turgor normal. No rashes or lesions Lymph nodes: Cervical, supraclavicular, and axillary nodes normal. No abnormal inguinal nodes palpated Neurologic: Grossly normal  Pelvic: External genitalia:  no lesions              Urethra:  normal appearing urethra with no masses, tenderness or lesions              Bartholins and Skenes: normal                 Vagina: normal appearing vagina with normal color and discharge, no lesions              Cervix: no lesions, short in length.                 Bimanual Exam:  Uterus:  normal size, contour, position, consistency, normal mobility, non-tender              Adnexa: no mass, fullness, tenderness              Rectal exam: Yes.  .  Confirms.              Anus:  normal sphincter tone, no lesions  Chaperone was present for exam.  ASSESSMENT    Abnormal uterine bleeding.  Urinary frequency.  Abnormal urine dip.  Hx conization and multiple LEEPs.  Hx weight loss surgery with revision and perforated bowel during the revision surgery, which was recognized and corrected. Hypothyroidism.  Back on thyroid replacement. Anaprox allergy.  No NSAIDs. Candida of skin.   PLAN  Pelvic US and EMB.  Will check TFTs and FSH, E2. Urine micro and culture.  Continue Synthroid. Diflucan course.  Will complete her Covid vaccine prior to surgery.  Will plan for  laparoscopic hysterectomy with bilateral salpingectomy and possible bilateral oophorectomy, cystoscopy.    I reviewed risks, benefits, and alternatives.  Risks include but are not limited to bleeding, infection, damage to surrounding organs, pneumonia, reaction to anesthesia, DVT, PE, death, need for reoperation, hernia formation, vaginal cuff dehiscence, need to convert to a traditional laparotomy incision to complete the procedure, inability to remove HPV from her body, and possible vaginal dysplasia requiring treatment in the future. Surgical expectations and recovery discussed.  Patient wishes to proceed. She will receive Lovenox for DVT/PE prophylaxis. Plan for liquid narcotic following surgery.   Questions invited and answered.  45 minutes total time was spent for this patient encounter, including preparation, face-to-face counseling with the patient, coordination of care, and documentation of the encounter.

## 2020-10-07 ENCOUNTER — Ambulatory Visit (INDEPENDENT_AMBULATORY_CARE_PROVIDER_SITE_OTHER): Payer: 59 | Admitting: Obstetrics and Gynecology

## 2020-10-07 ENCOUNTER — Other Ambulatory Visit: Payer: Self-pay

## 2020-10-07 ENCOUNTER — Encounter: Payer: Self-pay | Admitting: Obstetrics and Gynecology

## 2020-10-07 VITALS — BP 120/82 | HR 105 | Ht 63.0 in | Wt 201.0 lb

## 2020-10-07 DIAGNOSIS — R8290 Unspecified abnormal findings in urine: Secondary | ICD-10-CM

## 2020-10-07 DIAGNOSIS — R3915 Urgency of urination: Secondary | ICD-10-CM

## 2020-10-07 DIAGNOSIS — Z87410 Personal history of cervical dysplasia: Secondary | ICD-10-CM

## 2020-10-07 DIAGNOSIS — E039 Hypothyroidism, unspecified: Secondary | ICD-10-CM | POA: Diagnosis not present

## 2020-10-07 DIAGNOSIS — N939 Abnormal uterine and vaginal bleeding, unspecified: Secondary | ICD-10-CM | POA: Diagnosis not present

## 2020-10-07 DIAGNOSIS — R87810 Cervical high risk human papillomavirus (HPV) DNA test positive: Secondary | ICD-10-CM

## 2020-10-07 LAB — POCT URINALYSIS DIPSTICK
Glucose, UA: NEGATIVE
Ketones, UA: NEGATIVE
Leukocytes, UA: NEGATIVE
Nitrite, UA: NEGATIVE
Protein, UA: NEGATIVE
Urobilinogen, UA: 0.2 E.U./dL
pH, UA: 5 (ref 5.0–8.0)

## 2020-10-07 IMAGING — MG DIGITAL SCREENING BILAT W/ TOMO W/ CAD
6 of 10 series · 6 of 30 positions shown · non-contrast
Comparison: Previous exam(s).

CLINICAL DATA: Screening.

EXAM:
DIGITAL SCREENING BILATERAL MAMMOGRAM WITH TOMO AND CAD

[L MLO synth-2D (1 of 2)]
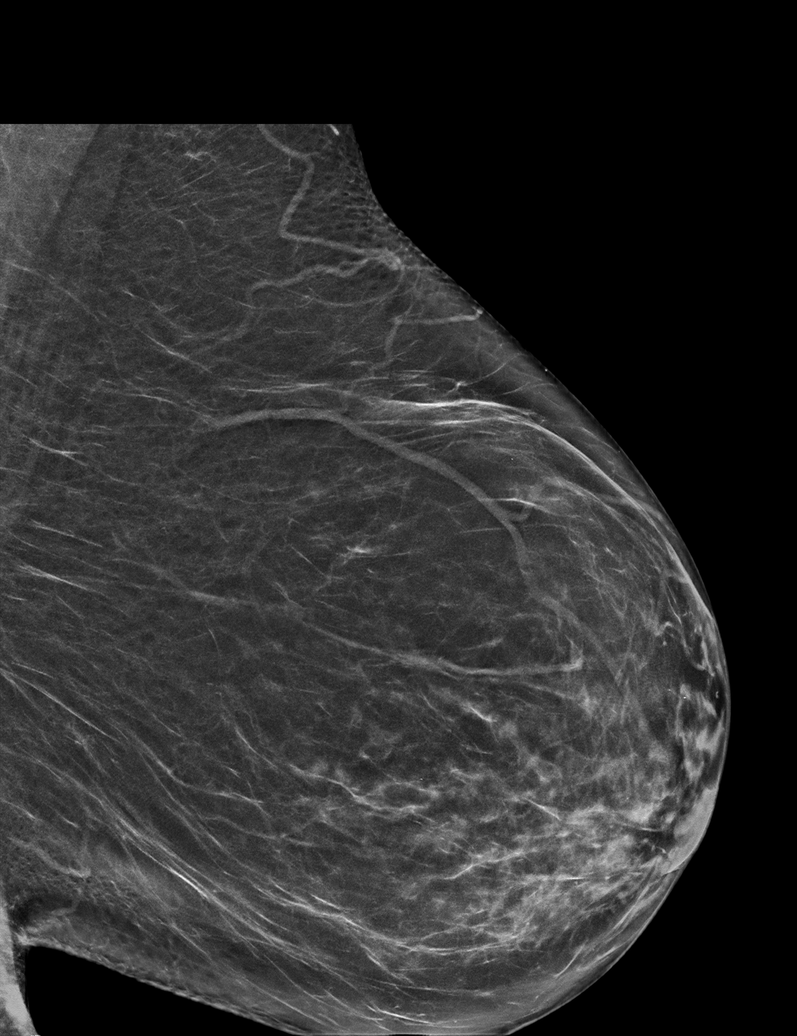

[R MLO synth-2D]
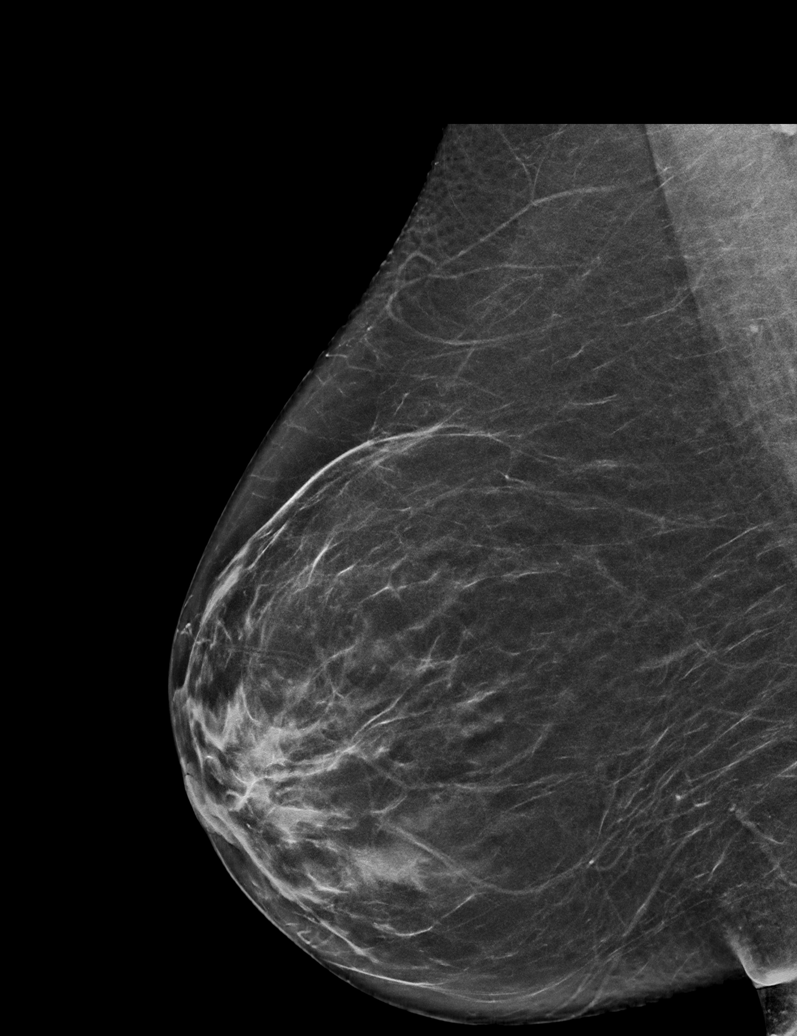

[L CC synth-2D]
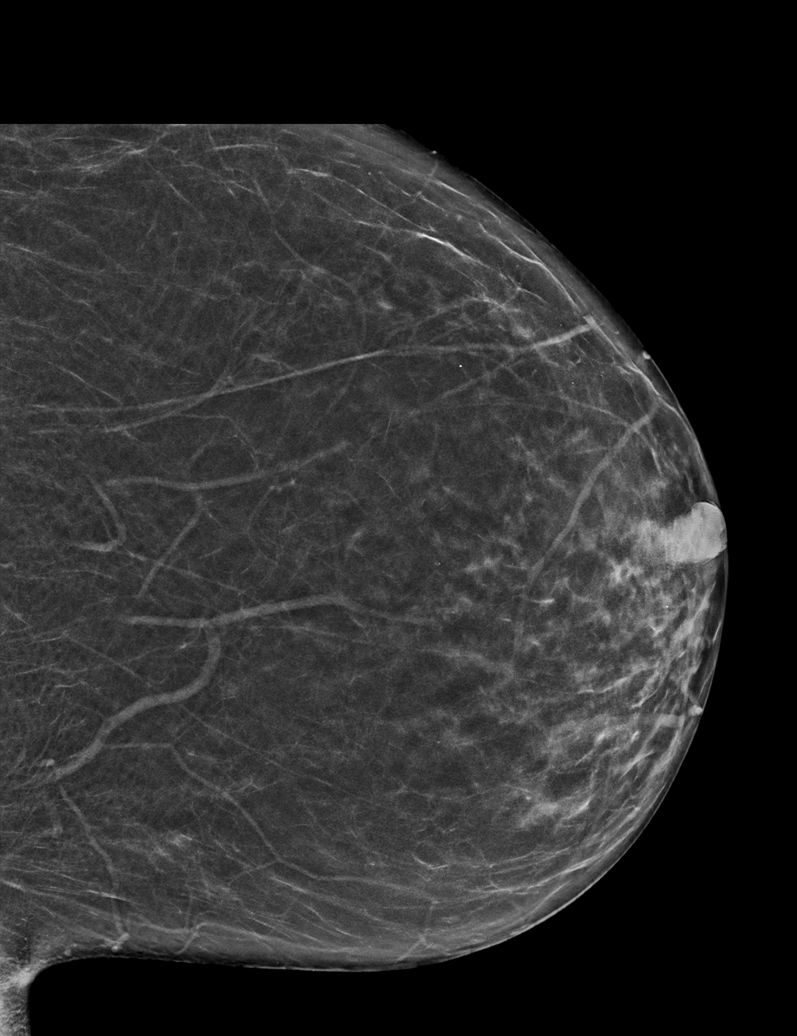

[R CC synth-2D]
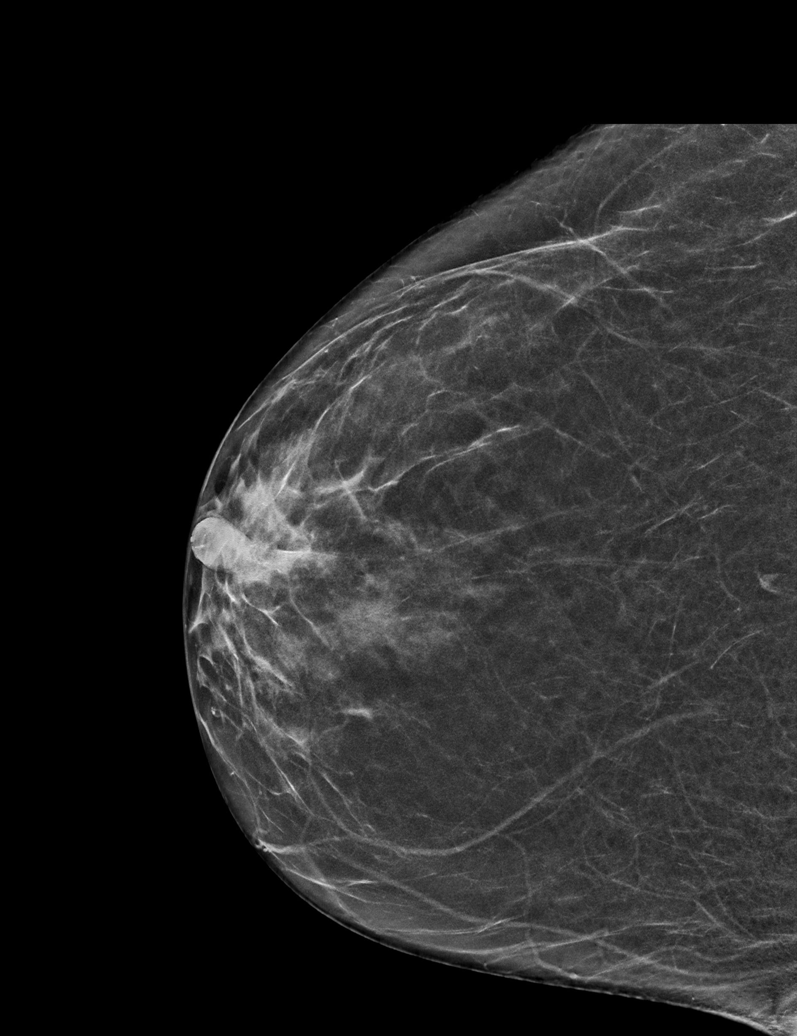

[L MLO synth-2D (2 of 2)]
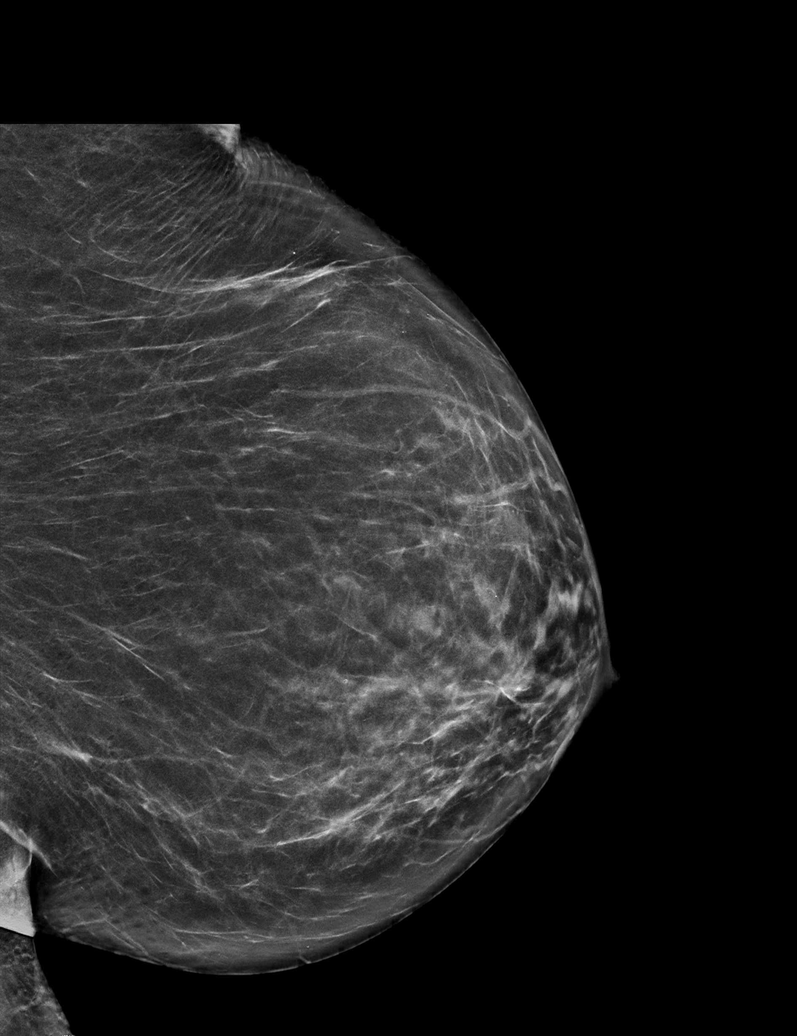

[L MLO tomo · tomo slice 33/66.0]
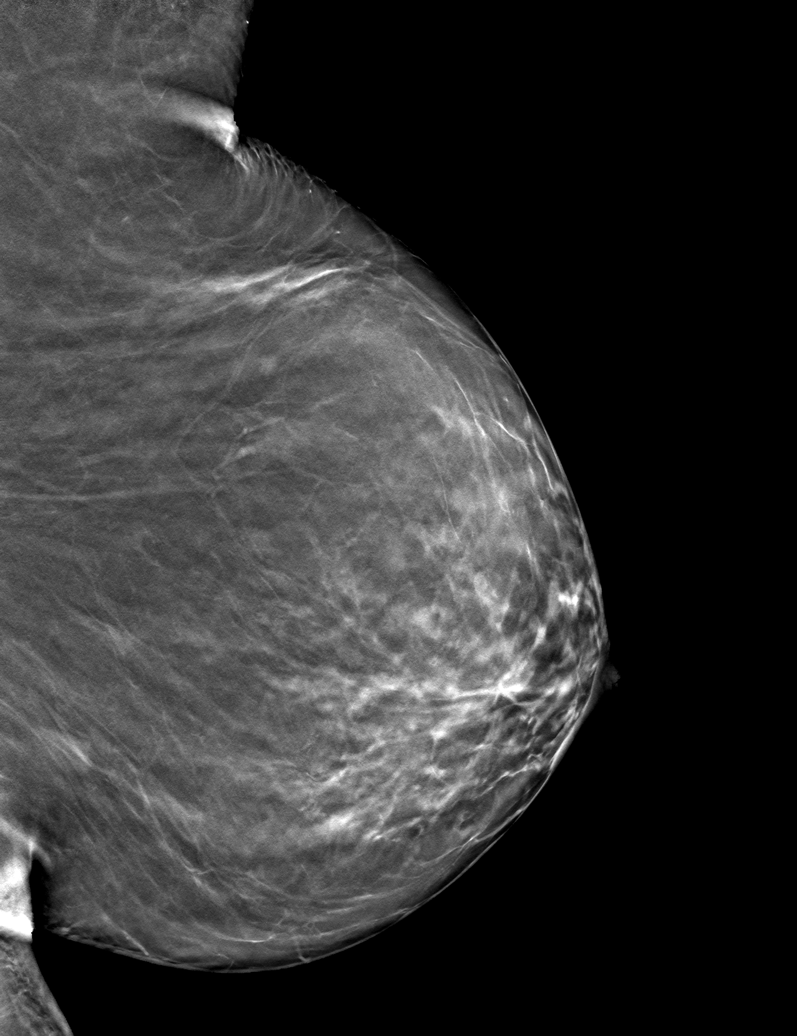

[6 of 30 positions shown; findings below may reference images not displayed]

ACR Breast Density Category b: There are scattered areas of
fibroglandular density.
FINDINGS: In the left breast, a possible mass warrants further evaluation. In
the right breast, no findings suspicious for malignancy.

Images were processed with CAD.
IMPRESSION: Further evaluation is suggested for possible mass in the left
breast.

RECOMMENDATION:
Diagnostic mammogram and possibly ultrasound of the left breast.
(Code:JC-2-SSL)

The patient will be contacted regarding the findings, and additional
imaging will be scheduled.

BI-RADS CATEGORY  0: Incomplete. Need additional imaging evaluation
and/or prior mammograms for comparison.

## 2020-10-07 MED ORDER — LEVOTHYROXINE SODIUM 50 MCG PO TABS: ORAL_TABLET | ORAL | 3 refills | Status: DC

## 2020-10-07 MED ORDER — FLUCONAZOLE 150 MG PO TABS: 150.0000 mg | ORAL_TABLET | Freq: Once | ORAL | 0 refills | Status: AC

## 2020-10-08 ENCOUNTER — Encounter: Payer: Self-pay | Admitting: Obstetrics and Gynecology

## 2020-10-08 LAB — URINALYSIS, MICROSCOPIC ONLY
Bacteria, UA: NONE SEEN
Casts: NONE SEEN /lpf

## 2020-10-08 LAB — ESTRADIOL: Estradiol: 6.8 pg/mL

## 2020-10-08 LAB — FOLLICLE STIMULATING HORMONE: FSH: 60.6 m[IU]/mL

## 2020-10-08 LAB — T4, FREE: Free T4: 1.15 ng/dL (ref 0.82–1.77)

## 2020-10-08 LAB — TSH: TSH: 1.92 u[IU]/mL (ref 0.450–4.500)

## 2020-10-09 ENCOUNTER — Telehealth: Payer: Self-pay

## 2020-10-09 DIAGNOSIS — Z0289 Encounter for other administrative examinations: Secondary | ICD-10-CM

## 2020-10-09 LAB — URINE CULTURE

## 2020-10-09 NOTE — Telephone Encounter (Signed)
Patient calling to go over lab results.  ?

## 2020-10-09 NOTE — Telephone Encounter (Signed)
Please let patient know that her [place] and estradiol level indicate she is menopausal.  I would not expect her to have bleeding, so we need to understand this better. I do recommend proceeding forward with the pelvic US and endometrial biopsy with me next week.  I will do a pap also PRIOR to her ultrasound.  Please mark this on the schedule.   Her urine culture is negative for infection.   Her thyroid function is normal.

## 2020-10-09 NOTE — Telephone Encounter (Signed)
Spoke with pt. Pt calling for results seen on Mychart from 10/07/20.  Pt asking if needing PUS or EMB due to being PMP before scheduled [facility]. Advised Dr Quincy Simmonds not in office today, but will send message and return call with recommendations after review.   Routing to Dr Quincy Simmonds. Please advise

## 2020-10-09 NOTE — Telephone Encounter (Signed)
Spoke with patient, advised as seen below per Dr. Quincy Simmonds. Patient agreeable to proceed with scheduling.   PUS scheduled for 9am on 10/15/20.  OV scheduled for 8:15am, PAP to be completed prior to PUS.  Orders have been placed for precert.  Hx of gastric bypass, patient will take 2 extra strength tylenol 1 hour prior to appt with food and hydrate well.   Patient verbalizes understanding and is agreeable.   Routing to provider for final review. Patient is agreeable to disposition. Will close encounter.  Cc: Hayley Carder

## 2020-10-12 ENCOUNTER — Other Ambulatory Visit: Payer: Self-pay | Admitting: *Deleted

## 2020-10-12 DIAGNOSIS — N939 Abnormal uterine and vaginal bleeding, unspecified: Secondary | ICD-10-CM

## 2020-10-12 NOTE — Telephone Encounter (Signed)
See telephone encounter dated 10/09/20.   Encounter closed.

## 2020-10-13 NOTE — Patient Instructions (Addendum)
YOU ARE SCHEDULED FOR A COVID TEST _11/18__@_12 :15 PM___. THIS TEST MUST BE DONE BEFORE SURGERY. GO TO  [facility]. [place], [place], IT IS APPROXIMATELY 2 MINUTES PAST [employer] ON THE RIGHT AND REMAIN IN YOUR CAR, THIS IS A DRIVE UP TEST.   ONCE YOUR COVID TEST IS DONE PLEASE FOLLOW ALL THE QUARANTINE  INSTRUCTIONS GIVEN IN YOUR HANDOUT.      Your procedure is scheduled on 10/26/20   Report to [facility] AT 5:30  A. M.   Call this number if you have problems the morning of surgery  [phone].   OUR ADDRESS IS [place].   WE ARE LOCATED IN [facility].  PLEASE BRING YOUR INSURANCE CARD AND PHOTO ID DAY OF SURGERY.  ONLY ONE PERSON ALLOWED IN FACILITY WAITING AREA.                                     REMEMBER:  DO NOT EAT FOOD OR DRINK LIQUIDS AFTER MIDNIGHT .    YOU MAY  BRUSH YOUR TEETH MORNING OF SURGERY AND RINSE YOUR MOUTH OUT, NO CHEWING GUM CANDY OR MINTS.    TAKE THESE MEDICATIONS MORNING OF SURGERY WITH A SIP OF WATER:  Depakote, Imipramine, Venlafaxine XR, Wellbutrin, Levothyroxine, Prilosec, Myrbetriq   ONE VISITOR IS ALLOWED IN WAITING ROOM ONLY DAY OF SURGERY.   NO VISITOR MAY SPEND THE NIGHT.  VISITOR ARE ALLOWED TO STAY UNTIL 8:00 PM.                                     DO NOT WEAR JEWERLY, MAKE UP, OR NAIL POLISH ON FINGERNAILS.  DO NOT WEAR LOTIONS, POWDERS, PERFUMES OR DEODORANT.  DO NOT SHAVE FOR 24 HOURS PRIOR TO DAY OF SURGERY.  CONTACTS, GLASSES, OR DENTURES MAY NOT BE WORN TO SURGERY.                                    [facility] IS NOT RESPONSIBLE  FOR ANY BELONGINGS.                                                                    Marland Kitchen                                                                                                   [facility] - Preparing for Surgery  Before surgery, you can play an important role.   Because skin is not sterile, your skin needs to be as free of  germs as possible.   You can reduce the  number of germs on your skin by washing with CHG (chlorahexidine gluconate) soap before surgery .  CHG is an antiseptic cleaner which kills germs and bonds with the skin to continue killing germs even after washing. Please DO NOT use if you have an allergy to CHG or antibacterial soaps.  If your skin becomes reddened/irritated stop using the CHG and inform your nurse when you arrive at Short Stay. Do not shave (including legs and underarms) for at least 48 hours prior to the first CHG shower  Please follow these instructions carefully:  1.  Shower with CHG Soap the night before surgery and the  morning of Surgery.  2.  If you choose to wash your hair, wash your hair first as usual with your  normal  shampoo.  3.  After you shampoo, rinse your hair and body thoroughly to remove the  shampoo.                                        4.  Use CHG as you would any other liquid soap.  You can apply chg directly  to the skin and wash                       Gently with a scrungie or clean washcloth.  5.  Apply the CHG Soap to your body ONLY FROM THE NECK DOWN.   Do not use on face/ open                           Wound or open sores. Avoid contact with eyes, ears mouth and genitals (private parts).                       Wash face,  Genitals (private parts) with your normal soap.             6.  Wash thoroughly, paying special attention to the area where your surgery  will be performed.  7.  Thoroughly rinse your body with warm water from the neck down.  8.  DO NOT shower/wash with your normal soap after using and rinsing off  the CHG Soap.             9.  Pat yourself dry with a clean towel.            10.  Wear clean pajamas.            11.  Place clean sheets on your bed the night of your first shower and do not  sleep with pets. Day of Surgery : Do not apply any lotions/deodorants the morning of surgery.  Please wear clean clothes to the hospital/surgery  center.  FAILURE TO FOLLOW THESE INSTRUCTIONS MAY RESULT IN THE CANCELLATION OF YOUR SURGERY PATIENT SIGNATURE_________________________________  NURSE SIGNATURE__________________________________  ________________________________________________________________________

## 2020-10-14 ENCOUNTER — Encounter: Payer: Self-pay | Admitting: Family

## 2020-10-14 ENCOUNTER — Other Ambulatory Visit: Payer: Self-pay

## 2020-10-14 ENCOUNTER — Inpatient Hospital Stay: Payer: 59 | Attending: Hematology & Oncology

## 2020-10-14 ENCOUNTER — Encounter (HOSPITAL_COMMUNITY): Payer: Self-pay

## 2020-10-14 ENCOUNTER — Encounter (HOSPITAL_COMMUNITY)
Admission: RE | Admit: 2020-10-14 | Discharge: 2020-10-14 | Disposition: A | Discharge status: Home / Self Care | Payer: 59 | Source: Ambulatory Visit | Attending: Obstetrics and Gynecology | Admitting: Obstetrics and Gynecology

## 2020-10-14 DIAGNOSIS — D510 Vitamin B12 deficiency anemia due to intrinsic factor deficiency: Secondary | ICD-10-CM | POA: Insufficient documentation

## 2020-10-14 DIAGNOSIS — K912 Postsurgical malabsorption, not elsewhere classified: Secondary | ICD-10-CM

## 2020-10-14 DIAGNOSIS — E119 Type 2 diabetes mellitus without complications: Secondary | ICD-10-CM | POA: Insufficient documentation

## 2020-10-14 DIAGNOSIS — Z01818 Encounter for other preprocedural examination: Secondary | ICD-10-CM | POA: Insufficient documentation

## 2020-10-14 DIAGNOSIS — D508 Other iron deficiency anemias: Secondary | ICD-10-CM

## 2020-10-14 HISTORY — DX: Hypothyroidism, unspecified: E03.9

## 2020-10-14 HISTORY — DX: Unspecified osteoarthritis, unspecified site: M19.90

## 2020-10-14 LAB — CBC
HCT: 46.1 % — ABNORMAL HIGH (ref 36.0–46.0)
Hemoglobin: 15 g/dL (ref 12.0–15.0)
MCH: 31.6 pg (ref 26.0–34.0)
MCHC: 32.5 g/dL (ref 30.0–36.0)
MCV: 97.1 fL (ref 80.0–100.0)
Platelets: 308 10*3/uL (ref 150–400)
RBC: 4.75 MIL/uL (ref 3.87–5.11)
RDW: 11.6 % (ref 11.5–15.5)
WBC: 6.8 10*3/uL (ref 4.0–10.5)
nRBC: 0 % (ref 0.0–0.2)

## 2020-10-14 LAB — BASIC METABOLIC PANEL
Anion gap: 10 (ref 5–15)
BUN: 12 mg/dL (ref 6–20)
CO2: 27 mmol/L (ref 22–32)
Calcium: 9 mg/dL (ref 8.9–10.3)
Chloride: 103 mmol/L (ref 98–111)
Creatinine, Ser: 0.83 mg/dL (ref 0.44–1.00)
GFR, Estimated: >60 mL/min (ref >60)
Glucose, Bld: 94 mg/dL (ref 70–99)
Potassium: 4.5 mmol/L (ref 3.5–5.1)
Sodium: 140 mmol/L (ref 135–145)

## 2020-10-14 LAB — CBC WITH DIFFERENTIAL (CANCER CENTER ONLY)
Abs Immature Granulocytes: 0.01 10*3/uL (ref 0.00–0.07)
Basophils Absolute: 0 10*3/uL (ref 0.0–0.1)
Basophils Relative: 0 %
Eosinophils Absolute: 0.1 10*3/uL (ref 0.0–0.5)
Eosinophils Relative: 1 %
HCT: 45.6 % (ref 36.0–46.0)
Hemoglobin: 15.1 g/dL — ABNORMAL HIGH (ref 12.0–15.0)
Immature Granulocytes: 0 %
Lymphocytes Relative: 37 %
Lymphs Abs: 2.7 10*3/uL (ref 0.7–4.0)
MCH: 31.4 pg (ref 26.0–34.0)
MCHC: 33.1 g/dL (ref 30.0–36.0)
MCV: 94.8 fL (ref 80.0–100.0)
Monocytes Absolute: 0.5 10*3/uL (ref 0.1–1.0)
Monocytes Relative: 7 %
Neutro Abs: 4.1 10*3/uL (ref 1.7–7.7)
Neutrophils Relative %: 55 %
Platelet Count: 297 10*3/uL (ref 150–400)
RBC: 4.81 MIL/uL (ref 3.87–5.11)
RDW: 11.5 % (ref 11.5–15.5)
WBC Count: 7.5 10*3/uL (ref 4.0–10.5)
nRBC: 0 % (ref 0.0–0.2)

## 2020-10-14 LAB — CMP (CANCER CENTER ONLY)
ALT: 20 U/L (ref 0–44)
AST: 23 U/L (ref 15–41)
Albumin: 3.8 g/dL (ref 3.5–5.0)
Alkaline Phosphatase: 82 U/L (ref 38–126)
Anion gap: 8 (ref 5–15)
BUN: 11 mg/dL (ref 6–20)
CO2: 28 mmol/L (ref 22–32)
Calcium: 9.4 mg/dL (ref 8.9–10.3)
Chloride: 103 mmol/L (ref 98–111)
Creatinine: 0.93 mg/dL (ref 0.44–1.00)
GFR, Estimated: >60 mL/min (ref >60)
Glucose, Bld: 98 mg/dL (ref 70–99)
Potassium: 4.5 mmol/L (ref 3.5–5.1)
Sodium: 139 mmol/L (ref 135–145)
Total Bilirubin: 0.4 mg/dL (ref 0.3–1.2)
Total Protein: 7.4 g/dL (ref 6.5–8.1)

## 2020-10-14 LAB — TYPE AND SCREEN
ABO/RH(D): A POS
Antibody Screen: NEGATIVE

## 2020-10-14 LAB — HEMOGLOBIN A1C
Hgb A1c MFr Bld: 5.4 % (ref 4.8–5.6)
Mean Plasma Glucose: 108.28 mg/dL

## 2020-10-14 LAB — VITAMIN B12: Vitamin B-12: 279 pg/mL (ref 180–914)

## 2020-10-14 NOTE — Progress Notes (Signed)
COVID Vaccine Completed:Yes Date COVID Vaccine completed: 1st dose only 10?28/21 COVID vaccine manufacturer: Pfizer      PCP - Dr. Cipriano [place] Cardiologist - no  Chest x-ray - no EKG - no Stress Test - no ECHO - [facility] - no Pacemaker/ICD device last checked:NA  Sleep Study - no CPAP -   Fasting Blood Sugar - no Checks Blood Sugar _____ times a day  Blood Thinner Instructions:NA Aspirin Instructions: Last Dose:  Anesthesia review:   Patient denies shortness of breath, fever, cough and chest pain at PAT appointment yes   Patient verbalized understanding of instructions that were given to them at the PAT appointment. Patient was also instructed that they will need to review over the PAT instructions again at home before surgery. Yes Pt reports that Murmur is mild and ECHO done in [facility] at [facility]. Pt states that she is on Ozempic and semaglutide for wt loss not diabetes.

## 2020-10-15 ENCOUNTER — Telehealth: Payer: Self-pay | Admitting: *Deleted

## 2020-10-15 ENCOUNTER — Ambulatory Visit (INDEPENDENT_AMBULATORY_CARE_PROVIDER_SITE_OTHER): Payer: 59

## 2020-10-15 ENCOUNTER — Encounter: Payer: Self-pay | Admitting: Obstetrics and Gynecology

## 2020-10-15 ENCOUNTER — Ambulatory Visit (INDEPENDENT_AMBULATORY_CARE_PROVIDER_SITE_OTHER): Payer: 59 | Admitting: Obstetrics and Gynecology

## 2020-10-15 ENCOUNTER — Other Ambulatory Visit: Payer: Self-pay | Admitting: Obstetrics and Gynecology

## 2020-10-15 ENCOUNTER — Other Ambulatory Visit (HOSPITAL_COMMUNITY)
Admission: RE | Admit: 2020-10-15 | Discharge: 2020-10-15 | Disposition: A | Discharge status: Home / Self Care | Payer: 59 | Source: Ambulatory Visit | Attending: Obstetrics and Gynecology | Admitting: Obstetrics and Gynecology

## 2020-10-15 VITALS — BP 118/78 | HR 84 | Ht 63.0 in | Wt 201.0 lb

## 2020-10-15 DIAGNOSIS — N939 Abnormal uterine and vaginal bleeding, unspecified: Secondary | ICD-10-CM

## 2020-10-15 DIAGNOSIS — N950 Postmenopausal bleeding: Secondary | ICD-10-CM | POA: Insufficient documentation

## 2020-10-15 DIAGNOSIS — R87810 Cervical high risk human papillomavirus (HPV) DNA test positive: Secondary | ICD-10-CM

## 2020-10-15 DIAGNOSIS — Z87410 Personal history of cervical dysplasia: Secondary | ICD-10-CM

## 2020-10-15 LAB — IRON AND TIBC
Iron: 68 ug/dL (ref 41–142)
Saturation Ratios: 21 % (ref 21–57)
TIBC: 320 ug/dL (ref 236–444)
UIBC: 252 ug/dL (ref 120–384)

## 2020-10-15 LAB — FERRITIN: Ferritin: 200 ng/mL (ref 11–307)

## 2020-10-15 MED ORDER — DOXYCYCLINE MONOHYDRATE 100 MG PO TABS: 100.0000 mg | ORAL_TABLET | Freq: Two times a day (BID) | ORAL | 0 refills | Status: DC

## 2020-10-15 MED ORDER — FLUCONAZOLE 150 MG PO TABS: 150.0000 mg | ORAL_TABLET | Freq: Once | ORAL | 0 refills | Status: AC

## 2020-10-15 NOTE — Telephone Encounter (Signed)
Call received from patient wanting to know if she needs an iron infusion.  Lab results from 10/14/20 reviewed by S. [place] NP and pt notified that no iron infusions are needed at this time per order of S. [place] NP.  Pt appreciative of assistance and has no further questions at this time.

## 2020-10-15 NOTE — Progress Notes (Signed)
1GYNECOLOGY  VISIT   HPI: 54 y.o.   Divorced  Caucasian  female   G1P1 with Patient's last menstrual period was 03/20/2019 (approximate).   here for ultrasound, pap, and EMB.    Patient is scheduled for a laparoscopic hysterectomy 10/26/20 for recurrent cervical dysplasia and reported recent postmenopausal bleeding which started 09/27/20. FSH 60.6 and estradiol 6.8 on 12/08/19. No further bleeding.  Patient is not taking HRT.  Patient has a stenotic cervix and has had recurrent LEEPs due to inability to evaluate the squamocolumnar junction adequately at the time of colposcopy.  Her cervix is now short.   GYNECOLOGIC HISTORY: Patient's last menstrual period was 03/20/2019 (approximate). Contraception:  Tubal Menopausal hormone therapy:  none Last mammogram:  04-01-20 3D/Lt.Br.poss.mass;Rt.Br.Neg--Lt.Br.Diag was Neg/density B/BiRads1 Last pap smear:   04/22/20 LGSIL:Pos HR HPV 10-22-19 ASCUS:Pos HRHPV 05-02-19 Neg:Neg HR HPV 09-25-18 Neg:Neg HR HPV        OB History    Gravida  1   Para  1   Term      Preterm      AB      Living  1     SAB      TAB      Ectopic      Multiple  1   Live Births                 Patient Active Problem List   Diagnosis Date Noted  . Vitamin D deficiency 05/23/2017  . Anxiety 06/19/2015  . Anemia, iron deficiency 06/19/2015  . Pernicious anemia 06/19/2015  . 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 Protein-calorie malnutrition, severe ([place]) 04/18/2015  . Severe protein-calorie malnutrition ([place]) 04/18/2015  . 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 Bariatric surgery status 04/18/2015  . 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 GERD (gastroesophageal reflux disease) 04/17/2015  . Hypokalemia 04/16/2015  . Migraine with aura 05/21/2014  . 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 Abnormal Pap smear   . Anemia   . Anxiety   . Arthritis    neck  . Broken toe 12-22-15   middle toe right foot  . Cataract of both eyes   . Depression   . GERD (gastroesophageal reflux disease)   . Headache(784.0)   . Heart murmur   . History of hiatal hernia 2021   small  . History of kidney stones   . Hypothyroidism   . 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 Migraine   . Perforated bowel ([place])   . Pernicious anemia 06/19/2015  . PONV (postoperative nausea and vomiting)   . Thyroid disease    hypothyrodism    Past Surgical History:  Procedure Laterality Date  . CATARACT EXTRACTION, BILATERAL Bilateral 02/2016  . CERVICAL BIOPSY  W/ LOOP ELECTRODE EXCISION  11/2013, 05/2018, 11/2019   LGSIL, negative for dysplasia, LGSIL and positive ECC with potential LGSIL  - respectively  . 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 CESAREAN SECTION  2008  . CHOLECYSTECTOMY    . COLONOSCOPY  10/2020  . GASTRIC BYPASS  2002  . HYSTEROSCOPY WITH D & C  10/14/2011   Procedure: DILATATION AND CURETTAGE (D&C) /HYSTEROSCOPY;  Surgeon: Arloa Koh;  Location: [facility];  Service: Gynecology;  Laterality: Bilateral;  . LASIK    . NOSE SURGERY    . STOMACH SURGERY  04/20/2015   dilation of "connection between stomach  and intestine", [facility]  . TONSILLECTOMY    . TONSILLECTOMY    . TUBAL LIGATION  2008    Current Outpatient Medications  Medication Sig Dispense Refill  . AIMOVIG 140 MG/ML SOAJ Inject 140 mg as directed every 30 (thirty) days.     . ALPRAZolam (XANAX) 1 MG tablet Take 1 mg by mouth 2 (two) times daily as needed. Anxiety      . Armodafinil 250 MG tablet Take 250 mg by mouth every morning.  2  . baclofen (LIORESAL) 10 MG tablet Take 10 mg by mouth 2 (two) times daily as needed for muscle spasms.     . botulinum toxin Type A (BOTOX) 100 units SOLR injection once. Unaware of dose, pt gets q 38mo  for migraines    . Botulinum Toxin Type A, Cosm, 100 units SOLR once. Unaware of dose, pt gets q 83mo for migraines    . CONTRAVE 8-90 MG TB12 Take 2 tablets by mouth 2 (two) times daily.    . cyanocobalamin (,VITAMIN B-12,) 1000 MCG/ML injection Inject 1,000 mcg into the muscle every 21 ( twenty-one) days.     Marland Kitchen DEXILANT 60 MG capsule Take 60 mg by mouth daily.    . diphenhydrAMINE (BENADRYL) 12.5 MG/5ML elixir Take 12.5 mg by mouth every 6 (six) hours as needed for allergies.     Marland Kitchen divalproex (DEPAKOTE) 500 MG DR tablet Take 1 tablet (500 mg total) by mouth 2 (two) times daily. 180 tablet 4  . doxepin (SINEQUAN) 75 MG capsule Take 75 mg by mouth at bedtime.     Marland Kitchen eletriptan (RELPAX) 40 MG tablet Take 1 tablet (40 mg total) by mouth as needed for migraine or headache. May repeat in 2 hours if needed. Max 2 tabs per day or 8 tabs per month. 10 tablet 12  . ergocalciferol (VITAMIN D2) 50000 UNITS capsule Take 50,000 Units by mouth See admin instructions. She is taking daily M-F - pr pt    . Eszopiclone 3 MG TABS Take 3 mg by mouth at bedtime.   1  . fexofenadine (ALLEGRA) 180 MG tablet Take 180 mg by mouth daily as needed. allergies     . fluticasone (FLONASE) 50 MCG/ACT nasal spray Place 1 spray into both nostrils as needed for allergies.   1  . gabapentin (NEURONTIN) 300 MG capsule Take 300-900 mg by mouth See admin instructions. Taking 1 (300mg ) at night for 1 week, then 2 capsules (600mg ) for 2nd week, then 3 capsules (900mg ) at night until seen    . hydrOXYzine (ATARAX/VISTARIL) 25 MG tablet Take 25 mg by mouth every 8 (eight) hours as needed (headache).   2  . imipramine (TOFRANIL) 50 MG tablet Take 100 mg by mouth daily.   0  . lansoprazole (PREVACID) 30 MG capsule Take 30 mg by mouth daily as needed (acid reflux). Acid reflux      . Lasmiditan Succinate 100 MG TABS Take 100 mg by mouth as needed (headache onset).     Marland Kitchen levothyroxine (SYNTHROID) 50 MCG tablet TAKE 1 TABLET BY MOUTH EVERY DAY  30 MINUTES BEFORE EATING (Patient taking differently: Take 50 mcg by mouth daily before breakfast. TAKE 1 TABLET BY MOUTH EVERY DAY 30 MINUTES BEFORE EATING) 90 tablet 3  . LINZESS 145 MCG CAPS capsule Take 145 mcg by mouth daily.  3  . Multiple Vitamins-Minerals (MULTIVITAMIN WITH MINERALS) tablet Take 1 tablet by mouth daily.      Marland Kitchen MYRBETRIQ 25 MG TB24 tablet  TAKE 1 TABLET(25 MG) BY MOUTH DAILY (Patient taking differently: Take 25 mg by mouth daily. ) 30 tablet 6  . nystatin (MYCOSTATIN/NYSTOP) powder Apply 1 application topically daily as needed (yeast infection).     . nystatin-triamcinolone (MYCOLOG II) cream Apply 1 application topically 2 (two) times daily. Apply to affected area BID for up to 7 days. (Patient taking differently: Apply 1 application topically 2 (two) times daily as needed (rash). ) 60 g 0  . omeprazole (PRILOSEC) 20 MG capsule Take 20 mg by mouth daily as needed (heartburn).     . ondansetron (ZOFRAN) 4 MG tablet Take 4 mg by mouth every 8 (eight) hours as needed for nausea or vomiting.     Marland Kitchen OZEMPIC, 0.25 OR 0.5 MG/DOSE, 2 MG/1.5ML SOPN Inject 0.25 mg into the skin once a week. Sunday    . promethazine (PHENERGAN) 12.5 MG tablet TAKE 1 TABLET(12.5 MG) BY MOUTH EVERY 8 HOURS AS NEEDED FOR NAUSEA OR VOMITING (Patient taking differently: Take 12.5 mg by mouth every 8 (eight) hours as needed for nausea or vomiting. ) 15 tablet 0  . promethazine (PROMETHEGAN) 25 MG suppository Place 25 mg rectally every 6 (six) hours as needed for nausea or vomiting.     . thiamine (VITAMIN B-1) 50 MG tablet Take 50 mg by mouth daily.   0  . tiZANidine (ZANAFLEX) 4 MG tablet Take 2-4 mg by mouth at bedtime.   0  . UBRELVY 50 MG TABS Take 50 mg by mouth daily as needed (migraine).     . WELLBUTRIN XL 300 MG 24 hr tablet Take 300 mg by mouth daily.   1  . zonisamide (ZONEGRAN) 50 MG capsule Take 50 mg by mouth at bedtime.     No current facility-administered medications for this visit.      ALLERGIES: Naproxen, Monistat [miconazole], Sulfa antibiotics, and Tioconazole  Family History  Problem Relation Age of Onset  . Diabetes Mother   . Heart disease Father   . Social phobia Father   . Psychiatric Illness Father   . Hypertension Father   . Stroke Father   . Dementia Father   . Heart disease Other   . Diabetes Other   . Stroke Other   . Hypertension Maternal Grandmother   . Thyroid disease Maternal Grandmother   . Hypertension Paternal Grandmother   . Stroke Paternal Grandfather   . Breast cancer Neg Hx     Social History   Socioeconomic History  . Marital status: Divorced    Spouse name: Not on file  . Number of children: 1  . Years of education: College  . Highest education level: Not on file  Occupational History    Employer: [employer]  Tobacco Use  . Smoking status: Never Smoker  . Smokeless tobacco: Never Used  Vaping Use  . Vaping Use: Never used  Substance and Sexual Activity  . Alcohol use: Yes    Alcohol/week: 0.0 standard drinks    Comment: Once a month-rarely  . Drug use: No  . Sexual activity: Not Currently    Partners: Male    Birth control/protection: Surgical    Comment: tubal  Other Topics Concern  . Not on file  Social History Narrative   Pt lives at home with her family.   Caffeine Use: once daily   Social Determinants of Health   Financial Resource Strain:   . Difficulty of Paying Living Expenses: Not on file  Food Insecurity:   . Worried About  Running Out of Food in the Last Year: Not on file  . Ran Out of Food in the Last Year: Not on file  Transportation Needs:   . Lack of Transportation (Medical): Not on file  . Lack of Transportation (Non-Medical): Not on file  Physical Activity:   . Days of Exercise per Week: Not on file  . Minutes of Exercise per Session: Not on file  Stress:   . Feeling of Stress : Not on file  Social Connections:   . Frequency of Communication with Friends and Family: Not on file   . Frequency of Social Gatherings with Friends and Family: Not on file  . Attends Religious Services: Not on file  . Active Member of Clubs or Organizations: Not on file  . Attends Archivist Meetings: Not on file  . Marital Status: Not on file  Intimate Partner Violence:   . Fear of Current or Ex-Partner: Not on file  . Emotionally Abused: Not on file  . Physically Abused: Not on file  . Sexually Abused: Not on file    Review of Systems  Constitutional: Negative.   HENT: Negative.   Eyes: Negative.   Respiratory: Negative.   Cardiovascular: Negative.   Gastrointestinal: Negative.   Endocrine: Negative.   Genitourinary: Negative.   Musculoskeletal: Negative.   Skin: Negative.   Allergic/Immunologic: Negative.   Neurological: Negative.   Hematological: Negative.   Psychiatric/Behavioral: Negative.     PHYSICAL EXAMINATION:    BP 118/78 (BP Location: Right Arm, Patient Position: Sitting, Cuff Size: Large)   Pulse 84   Ht 5\' 3"  (1.6 m)   Wt 201 lb (91.2 kg)   LMP 03/20/2019 (Approximate)   BMI 35.61 kg/m     General appearance: alert, cooperative and appears stated age   Pelvic: External genitalia:  no lesions              Urethra:  normal appearing urethra with no masses, tenderness or lesions              Bartholins and Skenes: normal                 Vagina: normal appearing vagina with normal color and discharge, no lesions              Cervix:  Stenotic.  Pap and HR HPV taken.                 Bimanual Exam:  Uterus:  normal size, contour, position, consistency, mobility, non-tender              Adnexa: no mass, fullness, tenderness             Pelvic US Uterus no masses.  EMS 5.07 mm.  Calcifications noted in the canal.  Fluid noted in the cervix.  Right ovary normal.  Left ovary 1.9 cm simple cyst.  No adnexal masses.  No free fluid.  EMB Consent for procedure.  Prep with Hibiclens.  Paracervical block 10 cc 1% lidocaine.  Lot [id number], exp  5/24.  Scalpel used to open stenotic cervix. Os finder, dilator used to open os.  Pipelle passed x 3 to almost 7 cm.   Old dark blood noted.  Very little tissue.   Specimen to pathology.  Monsel's placed over cervix.  No complications.  EBL 10 cc.   Chaperone was present for exam.  ASSESSMENT  Postmenopausal bleeding.  Recurrent cervical dysplasia.  Status post multiple excisional procedures.  Stenotic cervix.  Simple left ovarian cyst.   PLAN  Ultrasound findings report and images reviewed.  FU pap, HR HPV, and EMB.  Post biopsy precautions given.  Doxycycline tabs, 100 ng po bid x 1 week.  Diflucan 150 mg po x 1.  May repeat in 72 hours prn.

## 2020-10-15 NOTE — Patient Instructions (Signed)

## 2020-10-16 LAB — SURGICAL PATHOLOGY

## 2020-10-17 LAB — CYTOLOGY - PAP
Adequacy: ABSENT
Comment: NEGATIVE
Diagnosis: UNDETERMINED — AB
High risk HPV: POSITIVE — AB

## 2020-10-19 ENCOUNTER — Telehealth: Payer: Self-pay

## 2020-10-19 NOTE — Telephone Encounter (Signed)
Called pt to r/s missed appts per inbasket message for lab, inj and visit.  Pt will be having a hysterectomy next week and may need to r/s this appt...Marland KitchenMarland KitchenMarland Kitchen AOM

## 2020-10-22 ENCOUNTER — Other Ambulatory Visit (HOSPITAL_COMMUNITY)
Admission: RE | Admit: 2020-10-22 | Discharge: 2020-10-22 | Disposition: A | Discharge status: Home / Self Care | Payer: 59 | Source: Ambulatory Visit | Attending: Obstetrics and Gynecology | Admitting: Obstetrics and Gynecology

## 2020-10-22 DIAGNOSIS — Z01812 Encounter for preprocedural laboratory examination: Secondary | ICD-10-CM | POA: Insufficient documentation

## 2020-10-22 DIAGNOSIS — Z20822 Contact with and (suspected) exposure to covid-19: Secondary | ICD-10-CM | POA: Insufficient documentation

## 2020-10-23 LAB — SARS CORONAVIRUS 2 (TAT 6-24 HRS): SARS Coronavirus 2: NEGATIVE

## 2020-10-24 NOTE — Anesthesia Preprocedure Evaluation (Addendum)
Anesthesia Evaluation   Patient awake    Reviewed: Allergy & Precautions, NPO status , Patient's Chart, lab work & pertinent test results  History of Anesthesia Complications (+) PONV  Airway Mallampati: I  TM Distance: >3 FB Neck ROM: Full    Dental no notable dental hx. (+) Teeth Intact, Dental Advisory Given   Pulmonary neg pulmonary ROS,    Pulmonary exam normal breath sounds clear to auscultation       Cardiovascular negative cardio ROS Normal cardiovascular exam Rhythm:Regular Rate:Normal     Neuro/Psych  Headaches, PSYCHIATRIC DISORDERS Anxiety Depression    GI/Hepatic Neg liver ROS, hiatal hernia, GERD  Medicated and Controlled,H/o gastric bypass   Endo/Other  Hypothyroidism   Renal/GU negative Renal ROS  negative genitourinary   Musculoskeletal  (+) Arthritis ,   Abdominal   Peds  Hematology negative hematology ROS (+)   Anesthesia Other Findings   Reproductive/Obstetrics                            Anesthesia Physical Anesthesia Plan  ASA: II  Anesthesia Plan: General   Post-op Pain Management:    Induction: Intravenous  PONV Risk Score and Plan: 4 or greater and Midazolam, Dexamethasone, Ondansetron and Scopolamine patch - Pre-op  Airway Management Planned: Oral ETT  Additional Equipment:   Intra-op Plan:   Post-operative Plan: Extubation in OR  Informed Consent: I have reviewed the patients History and Physical, chart, labs and discussed the procedure including the risks, benefits and alternatives for the proposed anesthesia with the patient or authorized representative who has indicated his/her understanding and acceptance.     Dental advisory given  Plan Discussed with: CRNA  Anesthesia Plan Comments:        Anesthesia Quick Evaluation

## 2020-10-26 ENCOUNTER — Encounter (HOSPITAL_BASED_OUTPATIENT_CLINIC_OR_DEPARTMENT_OTHER): Payer: Self-pay | Admitting: Obstetrics and Gynecology

## 2020-10-26 ENCOUNTER — Other Ambulatory Visit: Payer: Self-pay

## 2020-10-26 ENCOUNTER — Encounter (HOSPITAL_BASED_OUTPATIENT_CLINIC_OR_DEPARTMENT_OTHER)
Admission: RE | Disposition: A | Discharge status: Home / Self Care | Payer: Self-pay | Source: Other Acute Inpatient Hospital | Attending: Obstetrics and Gynecology

## 2020-10-26 ENCOUNTER — Observation Stay (HOSPITAL_BASED_OUTPATIENT_CLINIC_OR_DEPARTMENT_OTHER)
Admission: RE | Admit: 2020-10-26 | Discharge: 2020-10-27 | Disposition: A | Discharge status: Home / Self Care | Payer: 59 | Source: Other Acute Inpatient Hospital | Attending: Obstetrics and Gynecology | Admitting: Obstetrics and Gynecology

## 2020-10-26 ENCOUNTER — Observation Stay (HOSPITAL_BASED_OUTPATIENT_CLINIC_OR_DEPARTMENT_OTHER): Payer: 59 | Admitting: Anesthesiology

## 2020-10-26 DIAGNOSIS — N939 Abnormal uterine and vaginal bleeding, unspecified: Secondary | ICD-10-CM | POA: Diagnosis not present

## 2020-10-26 DIAGNOSIS — N83202 Unspecified ovarian cyst, left side: Secondary | ICD-10-CM

## 2020-10-26 DIAGNOSIS — N879 Dysplasia of cervix uteri, unspecified: Secondary | ICD-10-CM

## 2020-10-26 DIAGNOSIS — R87810 Cervical high risk human papillomavirus (HPV) DNA test positive: Secondary | ICD-10-CM

## 2020-10-26 DIAGNOSIS — R3915 Urgency of urination: Secondary | ICD-10-CM | POA: Diagnosis present

## 2020-10-26 DIAGNOSIS — Z90710 Acquired absence of both cervix and uterus: Secondary | ICD-10-CM

## 2020-10-26 HISTORY — PX: CYSTOSCOPY: SHX5120

## 2020-10-26 HISTORY — PX: TOTAL LAPAROSCOPIC HYSTERECTOMY WITH BILATERAL SALPINGO OOPHORECTOMY: SHX6845

## 2020-10-26 LAB — CBC
HCT: 40 % (ref 36.0–46.0)
Hemoglobin: 12.9 g/dL (ref 12.0–15.0)
MCH: 31.2 pg (ref 26.0–34.0)
MCHC: 32.3 g/dL (ref 30.0–36.0)
MCV: 96.6 fL (ref 80.0–100.0)
Platelets: 241 10*3/uL (ref 150–400)
RBC: 4.14 MIL/uL (ref 3.87–5.11)
RDW: 11.6 % (ref 11.5–15.5)
WBC: 9.4 10*3/uL (ref 4.0–10.5)
nRBC: 0 % (ref 0.0–0.2)

## 2020-10-26 LAB — POCT PREGNANCY, URINE: Preg Test, Ur: NEGATIVE

## 2020-10-26 LAB — ABO/RH: ABO/RH(D): A POS

## 2020-10-26 SURGERY — HYSTERECTOMY, TOTAL, LAPAROSCOPIC, WITH BILATERAL SALPINGO-OOPHORECTOMY
Anesthesia: General | Site: Bladder

## 2020-10-26 MED ORDER — HYDROMORPHONE HCL 1 MG/ML IJ SOLN
INTRAMUSCULAR | Status: AC
  Filled 2020-10-26: qty 1

## 2020-10-26 MED ORDER — DEXAMETHASONE SODIUM PHOSPHATE 4 MG/ML IJ SOLN
INTRAMUSCULAR | Status: DC | PRN
  Administered 2020-10-26: 10 mg via INTRAVENOUS

## 2020-10-26 MED ORDER — ACETAMINOPHEN 500 MG PO TABS
1000.0000 mg | ORAL_TABLET | Freq: Once | ORAL | Status: AC
  Administered 2020-10-26: 1000 mg via ORAL

## 2020-10-26 MED ORDER — EPHEDRINE SULFATE 50 MG/ML IJ SOLN
INTRAMUSCULAR | Status: DC | PRN
  Administered 2020-10-26 (×2): 10 mg via INTRAVENOUS

## 2020-10-26 MED ORDER — ENOXAPARIN SODIUM 40 MG/0.4ML ~~LOC~~ SOLN
40.0000 mg | SUBCUTANEOUS | Status: AC
  Administered 2020-10-26: 40 mg via SUBCUTANEOUS

## 2020-10-26 MED ORDER — PROPOFOL 10 MG/ML IV BOLUS
INTRAVENOUS | Status: AC
  Filled 2020-10-26: qty 40

## 2020-10-26 MED ORDER — THIAMINE HCL 100 MG PO TABS
50.0000 mg | ORAL_TABLET | Freq: Every day | ORAL | Status: DC
  Filled 2020-10-26: qty 1

## 2020-10-26 MED ORDER — HYDROCODONE-ACETAMINOPHEN 7.5-325 MG/15ML PO SOLN
ORAL | Status: AC
  Filled 2020-10-26: qty 15

## 2020-10-26 MED ORDER — MORPHINE SULFATE (PF) 4 MG/ML IV SOLN
1.0000 mg | INTRAVENOUS | Status: DC | PRN
  Administered 2020-10-26: 1 mg via INTRAVENOUS

## 2020-10-26 MED ORDER — PANTOPRAZOLE SODIUM 40 MG PO TBEC
DELAYED_RELEASE_TABLET | ORAL | Status: AC
  Filled 2020-10-26: qty 1

## 2020-10-26 MED ORDER — MENTHOL 3 MG MT LOZG: 1.0000 | LOZENGE | OROMUCOSAL | Status: DC | PRN

## 2020-10-26 MED ORDER — SODIUM CHLORIDE 0.9% FLUSH
INTRAVENOUS | Status: DC | PRN
  Administered 2020-10-26: 30 mL via INTRAVENOUS

## 2020-10-26 MED ORDER — LACTATED RINGERS IV BOLUS: 500.0000 mL | Freq: Once | INTRAVENOUS | Status: DC

## 2020-10-26 MED ORDER — FENTANYL CITRATE (PF) 100 MCG/2ML IJ SOLN
INTRAMUSCULAR | Status: AC
  Filled 2020-10-26: qty 2

## 2020-10-26 MED ORDER — EPHEDRINE 5 MG/ML INJ
INTRAVENOUS | Status: AC
  Filled 2020-10-26: qty 10

## 2020-10-26 MED ORDER — FENTANYL CITRATE (PF) 250 MCG/5ML IJ SOLN
INTRAMUSCULAR | Status: AC
  Filled 2020-10-26: qty 5

## 2020-10-26 MED ORDER — ACETAMINOPHEN 500 MG PO TABS
ORAL_TABLET | ORAL | Status: AC
  Filled 2020-10-26: qty 2

## 2020-10-26 MED ORDER — LACTATED RINGERS IV SOLN: INTRAVENOUS | Status: DC

## 2020-10-26 MED ORDER — SCOPOLAMINE 1 MG/3DAYS TD PT72
MEDICATED_PATCH | TRANSDERMAL | Status: AC
  Filled 2020-10-26: qty 1

## 2020-10-26 MED ORDER — DEXAMETHASONE SODIUM PHOSPHATE 10 MG/ML IJ SOLN
INTRAMUSCULAR | Status: AC
  Filled 2020-10-26: qty 1

## 2020-10-26 MED ORDER — BACLOFEN 10 MG PO TABS
10.0000 mg | ORAL_TABLET | Freq: Two times a day (BID) | ORAL | Status: DC | PRN
  Filled 2020-10-26: qty 1

## 2020-10-26 MED ORDER — LINACLOTIDE 145 MCG PO CAPS
145.0000 ug | ORAL_CAPSULE | Freq: Every day | ORAL | Status: DC
  Filled 2020-10-26: qty 1

## 2020-10-26 MED ORDER — LIDOCAINE HCL (CARDIAC) PF 100 MG/5ML IV SOSY
PREFILLED_SYRINGE | INTRAVENOUS | Status: DC | PRN
  Administered 2020-10-26: 60 mg via INTRAVENOUS

## 2020-10-26 MED ORDER — IMIPRAMINE HCL 50 MG PO TABS
100.0000 mg | ORAL_TABLET | Freq: Every day | ORAL | Status: DC
  Administered 2020-10-26: 100 mg via ORAL
  Filled 2020-10-26 (×2): qty 2

## 2020-10-26 MED ORDER — DOXEPIN HCL 75 MG PO CAPS: 75.0000 mg | ORAL_CAPSULE | Freq: Every day | ORAL | Status: DC

## 2020-10-26 MED ORDER — GABAPENTIN 300 MG PO CAPS: 300.0000 mg | ORAL_CAPSULE | ORAL | Status: DC

## 2020-10-26 MED ORDER — BUPIVACAINE HCL (PF) 0.25 % IJ SOLN
INTRAMUSCULAR | Status: DC | PRN
  Administered 2020-10-26: 10 mL

## 2020-10-26 MED ORDER — ROCURONIUM BROMIDE 10 MG/ML (PF) SYRINGE
PREFILLED_SYRINGE | INTRAVENOUS | Status: AC
  Filled 2020-10-26: qty 10

## 2020-10-26 MED ORDER — FLUTICASONE PROPIONATE 50 MCG/ACT NA SUSP: 1.0000 | NASAL | Status: DC | PRN

## 2020-10-26 MED ORDER — ENOXAPARIN SODIUM 40 MG/0.4ML ~~LOC~~ SOLN
SUBCUTANEOUS | Status: AC
  Filled 2020-10-26: qty 0.4

## 2020-10-26 MED ORDER — LORATADINE 10 MG PO TABS: 10.0000 mg | ORAL_TABLET | Freq: Every day | ORAL | Status: DC

## 2020-10-26 MED ORDER — MIDAZOLAM HCL 2 MG/2ML IJ SOLN
INTRAMUSCULAR | Status: AC
  Filled 2020-10-26: qty 2

## 2020-10-26 MED ORDER — ONDANSETRON HCL 4 MG PO TABS: 4.0000 mg | ORAL_TABLET | Freq: Four times a day (QID) | ORAL | Status: DC | PRN

## 2020-10-26 MED ORDER — UBROGEPANT 50 MG PO TABS: 50.0000 mg | ORAL_TABLET | Freq: Every day | ORAL | Status: DC | PRN

## 2020-10-26 MED ORDER — MIDAZOLAM HCL 5 MG/5ML IJ SOLN
INTRAMUSCULAR | Status: DC | PRN
  Administered 2020-10-26 (×2): 1 mg via INTRAVENOUS
  Administered 2020-10-26: 2 mg via INTRAVENOUS

## 2020-10-26 MED ORDER — DEXMEDETOMIDINE (PRECEDEX) IN NS 20 MCG/5ML (4 MCG/ML) IV SYRINGE
PREFILLED_SYRINGE | INTRAVENOUS | Status: AC
  Filled 2020-10-26: qty 5

## 2020-10-26 MED ORDER — ROPIVACAINE HCL 5 MG/ML IJ SOLN
INTRAMUSCULAR | Status: DC | PRN
  Administered 2020-10-26: 30 mL via EPIDURAL

## 2020-10-26 MED ORDER — ONDANSETRON HCL 4 MG/2ML IJ SOLN: 4.0000 mg | Freq: Four times a day (QID) | INTRAMUSCULAR | Status: DC | PRN

## 2020-10-26 MED ORDER — HYDROMORPHONE HCL 1 MG/ML IJ SOLN
0.5000 mg | INTRAMUSCULAR | Status: DC | PRN
  Administered 2020-10-26 – 2020-10-27 (×8): 0.5 mg via INTRAVENOUS

## 2020-10-26 MED ORDER — SODIUM CHLORIDE 0.9 % IR SOLN
Status: DC | PRN
  Administered 2020-10-26: 300 mL

## 2020-10-26 MED ORDER — TIZANIDINE HCL 2 MG PO TABS
2.0000 mg | ORAL_TABLET | Freq: Every day | ORAL | Status: DC
  Administered 2020-10-26: 4 mg via ORAL
  Filled 2020-10-26 (×2): qty 2

## 2020-10-26 MED ORDER — LEVOTHYROXINE SODIUM 50 MCG PO TABS
50.0000 ug | ORAL_TABLET | Freq: Every day | ORAL | Status: DC
  Administered 2020-10-27: 50 ug via ORAL
  Filled 2020-10-26: qty 1

## 2020-10-26 MED ORDER — SODIUM CHLORIDE 0.9 % IV SOLN
2.0000 g | INTRAVENOUS | Status: AC
  Administered 2020-10-26: 2 g via INTRAVENOUS

## 2020-10-26 MED ORDER — POVIDONE-IODINE 10 % EX SWAB: 2.0000 "application " | Freq: Once | CUTANEOUS | Status: DC

## 2020-10-26 MED ORDER — HYDROCODONE-ACETAMINOPHEN 7.5-325 MG/15ML PO SOLN
10.0000 mL | ORAL | Status: DC | PRN
  Administered 2020-10-26: 10 mL via ORAL

## 2020-10-26 MED ORDER — ONDANSETRON HCL 4 MG/2ML IJ SOLN
INTRAMUSCULAR | Status: AC
  Filled 2020-10-26: qty 2

## 2020-10-26 MED ORDER — DIVALPROEX SODIUM 500 MG PO DR TAB
500.0000 mg | DELAYED_RELEASE_TABLET | Freq: Two times a day (BID) | ORAL | Status: DC
  Administered 2020-10-26: 500 mg via ORAL
  Filled 2020-10-26 (×2): qty 1

## 2020-10-26 MED ORDER — FENTANYL CITRATE (PF) 100 MCG/2ML IJ SOLN
INTRAMUSCULAR | Status: DC | PRN
  Administered 2020-10-26 (×5): 50 ug via INTRAVENOUS
  Administered 2020-10-26: 100 ug via INTRAVENOUS

## 2020-10-26 MED ORDER — SUGAMMADEX SODIUM 200 MG/2ML IV SOLN
INTRAVENOUS | Status: DC | PRN
  Administered 2020-10-26: 200 mg via INTRAVENOUS
  Administered 2020-10-26: 50 mg via INTRAVENOUS

## 2020-10-26 MED ORDER — FENTANYL CITRATE (PF) 100 MCG/2ML IJ SOLN
25.0000 ug | INTRAMUSCULAR | Status: DC | PRN
  Administered 2020-10-26: 25 ug via INTRAVENOUS
  Administered 2020-10-26: 50 ug via INTRAVENOUS

## 2020-10-26 MED ORDER — SCOPOLAMINE 1 MG/3DAYS TD PT72
1.0000 | MEDICATED_PATCH | TRANSDERMAL | Status: DC
  Administered 2020-10-26: 1.5 mg via TRANSDERMAL

## 2020-10-26 MED ORDER — HEMOSTATIC AGENTS (NO CHARGE) OPTIME
TOPICAL | Status: DC | PRN
  Administered 2020-10-26: 1 via TOPICAL

## 2020-10-26 MED ORDER — ONDANSETRON HCL 4 MG/2ML IJ SOLN
INTRAMUSCULAR | Status: DC | PRN
  Administered 2020-10-26: 4 mg via INTRAVENOUS

## 2020-10-26 MED ORDER — PANTOPRAZOLE SODIUM 40 MG PO TBEC
40.0000 mg | DELAYED_RELEASE_TABLET | Freq: Every day | ORAL | Status: DC
  Administered 2020-10-26: 40 mg via ORAL

## 2020-10-26 MED ORDER — DOXEPIN HCL 25 MG PO CAPS
75.0000 mg | ORAL_CAPSULE | Freq: Every day | ORAL | Status: DC
  Administered 2020-10-26: 75 mg via ORAL
  Filled 2020-10-26 (×2): qty 3

## 2020-10-26 MED ORDER — ELETRIPTAN HYDROBROMIDE 40 MG PO TABS
40.0000 mg | ORAL_TABLET | ORAL | Status: DC | PRN
  Filled 2020-10-26: qty 1

## 2020-10-26 MED ORDER — MORPHINE SULFATE (PF) 2 MG/ML IV SOLN
INTRAVENOUS | Status: AC
  Filled 2020-10-26: qty 1

## 2020-10-26 MED ORDER — SODIUM CHLORIDE 0.9 % IR SOLN
Status: DC | PRN
  Administered 2020-10-26: 300 mL via INTRAVESICAL

## 2020-10-26 MED ORDER — ARMODAFINIL 250 MG PO TABS: 250.0000 mg | ORAL_TABLET | ORAL | Status: DC

## 2020-10-26 MED ORDER — BUPROPION HCL ER (XL) 300 MG PO TB24
300.0000 mg | ORAL_TABLET | Freq: Every day | ORAL | Status: DC
  Administered 2020-10-26: 300 mg via ORAL
  Filled 2020-10-26 (×2): qty 1

## 2020-10-26 MED ORDER — ZONISAMIDE 25 MG PO CAPS
50.0000 mg | ORAL_CAPSULE | Freq: Every day | ORAL | Status: DC
  Administered 2020-10-26: 50 mg via ORAL
  Filled 2020-10-26 (×2): qty 2

## 2020-10-26 MED ORDER — ROCURONIUM BROMIDE 100 MG/10ML IV SOLN
INTRAVENOUS | Status: DC | PRN
  Administered 2020-10-26: 60 mg via INTRAVENOUS
  Administered 2020-10-26 (×2): 10 mg via INTRAVENOUS

## 2020-10-26 MED ORDER — PROPOFOL 10 MG/ML IV BOLUS
INTRAVENOUS | Status: DC | PRN
  Administered 2020-10-26: 20 mg via INTRAVENOUS
  Administered 2020-10-26: 100 mg via INTRAVENOUS
  Administered 2020-10-26: 50 mg via INTRAVENOUS
  Administered 2020-10-26: 30 mg via INTRAVENOUS

## 2020-10-26 MED ORDER — SODIUM CHLORIDE 0.9 % IV SOLN
INTRAVENOUS | Status: AC
  Filled 2020-10-26: qty 2

## 2020-10-26 SURGICAL SUPPLY — 70 items
ADH SKN CLS APL DERMABOND .7 (GAUZE/BANDAGES/DRESSINGS) ×2
APL SRG 38 LTWT LNG FL B (MISCELLANEOUS) ×2
APPLICATOR ARISTA FLEXITIP XL (MISCELLANEOUS) ×3 IMPLANT
BARRIER ADHS 3X4 INTERCEED (GAUZE/BANDAGES/DRESSINGS) IMPLANT
BRR ADH 4X3 ABS CNTRL BYND (GAUZE/BANDAGES/DRESSINGS)
CABLE HIGH FREQUENCY MONO STRZ (ELECTRODE) ×3 IMPLANT
CANISTER SUCT 3000ML PPV (MISCELLANEOUS) IMPLANT
CELL SAVER LIPIGURD (MISCELLANEOUS) IMPLANT
COVER BACK TABLE 60X90IN (DRAPES) IMPLANT
COVER MAYO STAND STRL (DRAPES) ×3 IMPLANT
COVER SURGICAL LIGHT HANDLE (MISCELLANEOUS) ×3 IMPLANT
COVER WAND RF STERILE (DRAPES) ×3 IMPLANT
DECANTER SPIKE VIAL GLASS SM (MISCELLANEOUS) ×9 IMPLANT
DERMABOND ADVANCED (GAUZE/BANDAGES/DRESSINGS) ×1
DERMABOND ADVANCED .7 DNX12 (GAUZE/BANDAGES/DRESSINGS) ×2 IMPLANT
DURAPREP 26ML APPLICATOR (WOUND CARE) ×3 IMPLANT
EXTRT SYSTEM ALEXIS 14CM (MISCELLANEOUS)
EXTRT SYSTEM ALEXIS 17CM (MISCELLANEOUS)
GAUZE 4X4 16PLY RFD (DISPOSABLE) ×3 IMPLANT
GLOVE BIO SURGEON STRL SZ 6.5 (GLOVE) ×9 IMPLANT
GLOVE BIOGEL PI IND STRL 7.0 (GLOVE) IMPLANT
GLOVE BIOGEL PI IND STRL 7.5 (GLOVE) ×4 IMPLANT
GLOVE BIOGEL PI INDICATOR 7.0 (GLOVE)
GLOVE BIOGEL PI INDICATOR 7.5 (GLOVE) ×2
GLOVE ECLIPSE 7.5 STRL STRAW (GLOVE) ×9 IMPLANT
GLOVE SURG SS PI 7.0 STRL IVOR (GLOVE) ×6 IMPLANT
GOWN STRL REUS W/TWL LRG LVL3 (GOWN DISPOSABLE) ×18 IMPLANT
HEMOSTAT ARISTA ABSORB 3G PWDR (HEMOSTASIS) ×3 IMPLANT
HOLDER FOLEY CATH W/STRAP (MISCELLANEOUS) IMPLANT
KIT TURNOVER CYSTO (KITS) ×3 IMPLANT
LIGASURE VESSEL 5MM BLUNT TIP (ELECTROSURGICAL) ×3 IMPLANT
MANIFOLD NEPTUNE II (INSTRUMENTS) ×3 IMPLANT
NEEDLE INSUFFLATION 120MM (ENDOMECHANICALS) ×3 IMPLANT
OCCLUDER COLPOPNEUMO (BALLOONS) ×3 IMPLANT
PACK LAPAROSCOPY BASIN (CUSTOM PROCEDURE TRAY) ×3 IMPLANT
PACK TRENDGUARD 450 HYBRID PRO (MISCELLANEOUS) ×2 IMPLANT
POUCH LAPAROSCOPIC INSTRUMENT (MISCELLANEOUS) ×3 IMPLANT
PROTECTOR NERVE ULNAR (MISCELLANEOUS) ×3 IMPLANT
RETRACTOR WOUND ALXS 19CM XSML (INSTRUMENTS) IMPLANT
RTRCTR WOUND ALEXIS 19CM XSML (INSTRUMENTS)
SCISSORS LAP 5X35 DISP (ENDOMECHANICALS) IMPLANT
SET IRRIG Y TYPE TUR BLADDER L (SET/KITS/TRAYS/PACK) ×3 IMPLANT
SET SUCTION IRRIG HYDROSURG (IRRIGATION / IRRIGATOR) ×3 IMPLANT
SET TRI-LUMEN FLTR TB AIRSEAL (TUBING) ×3 IMPLANT
SHEARS HARMONIC ACE PLUS 36CM (ENDOMECHANICALS) ×3 IMPLANT
SLEEVE ADV FIXATION 5X100MM (TROCAR) ×3 IMPLANT
SUT VIC AB 0 CT1 27 (SUTURE) ×6
SUT VIC AB 0 CT1 27XBRD ANBCTR (SUTURE) ×4 IMPLANT
SUT VIC AB 4-0 PS2 18 (SUTURE) ×3 IMPLANT
SUT VICRYL 0 UR6 27IN ABS (SUTURE) IMPLANT
SUT VLOC 180 0 9IN  GS21 (SUTURE) ×3
SUT VLOC 180 0 9IN GS21 (SUTURE) ×2 IMPLANT
SYR 10ML LL (SYRINGE) ×3 IMPLANT
SYR 50ML LL SCALE MARK (SYRINGE) ×6 IMPLANT
SYSTEM CARTER THOMASON II (TROCAR) IMPLANT
SYSTEM CONTND EXTRCTN KII BLLN (MISCELLANEOUS) IMPLANT
TIP RUMI ORANGE 6.7MMX12CM (TIP) IMPLANT
TIP UTERINE 5.1X6CM LAV DISP (MISCELLANEOUS) IMPLANT
TIP UTERINE 6.7X10CM GRN DISP (MISCELLANEOUS) IMPLANT
TIP UTERINE 6.7X6CM WHT DISP (MISCELLANEOUS) ×3 IMPLANT
TIP UTERINE 6.7X8CM BLUE DISP (MISCELLANEOUS) IMPLANT
TOWEL OR 17X26 10 PK STRL BLUE (TOWEL DISPOSABLE) ×3 IMPLANT
TRAY FOLEY W/BAG SLVR 14FR LF (SET/KITS/TRAYS/PACK) ×3 IMPLANT
TRENDGUARD 450 HYBRID PRO PACK (MISCELLANEOUS) ×3
TROCAR ADV FIXATION 5X100MM (TROCAR) ×3 IMPLANT
TROCAR BLADELESS OPT 5 100 (ENDOMECHANICALS) ×6 IMPLANT
TROCAR PORT AIRSEAL 5X120 (TROCAR) ×3 IMPLANT
TROCAR PORT AIRSEAL 8X120 (TROCAR) IMPLANT
TROCAR XCEL NON BLADE 8MM B8LT (ENDOMECHANICALS) ×3 IMPLANT
WARMER LAPAROSCOPE (MISCELLANEOUS) ×3 IMPLANT

## 2020-10-26 NOTE — H&P (Signed)
Office Visit  10/07/2020 [facility]   Amundson Lorenz Coaster, Everardo All, MD Obstetrics and Gynecology  Urinary urgency +5 more Dx  Surgical consult ; Referred by Carol Ada, MD Reason for Visit  Additional Documentation  Vitals:  BP 120/82  Pulse 105 Important    Ht 5\' 3"  (1.6 m)  Wt 91.2 kg  SpO2 99%  BMI 35.61 kg/m  BSA 2.01 m    More Vitals  Flowsheets:  Anthropometrics,  NEWS,  MEWS Score,  Method of Visit    Encounter Info:  Billing Info,  History,  Allergies,  Detailed Report    All Notes   Progress Notes by Nunzio Cobbs, MD at 10/07/2020 9:30 AM Author: Nunzio Cobbs, MD Author Type: Physician Filed: 10/09/2020  9:20 AM  Note Status: Signed Cosign: Cosign Not Required Encounter Date: 10/07/2020  Editor: Nunzio Cobbs, MD (Physician)      Prior Versions: 1. Lowella Fairy, CMA (Certified Psychologist, sport and exercise) at 10/07/2020  9:44 AM - Sign when Signing Visit     GYNECOLOGY  VISIT   HPI: 54 y.o.   Divorced  Caucasian  female   G1P1 with No LMP recorded. (Menstrual status: Perimenopausal).   here for surgical consult.    Patient is interested in hysterectomy for recurrent cervical dysplasia.  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  Her colposcopies do not allow for visualization of her transformation zone, and this is prompted recent LEEPs.  Her last LEEP on 11/18/19 showed LGSIL with a benign endocervical pass and minute atypical squamous cells and benign endocervical mucosa on her ECC.  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 No further colposcopy or LEEP was performed.    Patient complaining of urinary urgency. She states this is nothing new for her. Taking Myrbetriq.    She had BV and yeast 09/02/20 and both were treated.    She restarted her Synthroid generic 50 mcg daily after her TFTs were checked on 09/02/20.    Had a period  a couple of weeks ago for the first time in 1.5 years.  Prior LMP was 03/09/2019. Bleeding started 09/27/20 and this has stopped. FSH 64.7 and E2 77.1 on 09/25/20.   Urine dip:  Trace RBCs.   GYNECOLOGIC HISTORY: No LMP recorded. (Menstrual status: Perimenopausal). Contraception: tubal Menopausal hormone therapy:  none Last mammogram:  04-01-20  3D/Lt.Br.poss.mass;Rt.Br.Neg--Lt.Br.Diag was Neg/density B/BiRads1 Last pap smear:  04/22/20 LGSIL:Pos HR HPV                         10-22-19 ASCUS:Pos HR HPV                         05-02-19 Neg:Neg HR HPV                         09-25-18 Neg:Neg HR HPV                OB History     Gravida  1   Para  1   Term      Preterm      AB      Living  1      SAB      TAB      Ectopic      Multiple  1   Live Births                        Patient Active Problem List    Diagnosis Date Noted  . Vitamin D deficiency 05/23/2017  . Anxiety 06/19/2015  . Anemia, iron deficiency 06/19/2015  . Pernicious anemia 06/19/2015  . 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 Protein-calorie malnutrition, severe ([place]) 04/18/2015  . Severe protein-calorie malnutrition ([place]) 04/18/2015  . 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 Bariatric surgery status 04/18/2015  . 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 GERD (gastroesophageal reflux disease) 04/17/2015  . Hypokalemia 04/16/2015  . Migraine with aura 05/21/2014  . 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 Abnormal Pap smear    . Anemia    . Anxiety    . Broken toe 12-22-15    middle toe right foot  . Cataract of both eyes    . Depression    . GERD (gastroesophageal reflux disease)    . Headache(784.0)    . Heart murmur    . History of kidney stones    . 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 Migraine    . Perforated bowel ([place])    .  Pernicious anemia 06/19/2015  . PONV (postoperative nausea and vomiting)    . Thyroid disease      hypothyrodism           Past Surgical History:  Procedure Laterality Date  . CATARACT EXTRACTION, BILATERAL Bilateral 02/2016  . CERVICAL BIOPSY  W/ LOOP ELECTRODE EXCISION   11/2013, 05/2018, 11/2019    LGSIL, negative for dysplasia, LGSIL and positive ECC with potential LGSIL  - respectively  . CERVIX LESION DESTRUCTION   1993    CIN III, [facility], recurrence--YAG laser  . CESAREAN SECTION   2008  . CHOLECYSTECTOMY      . GASTRIC BYPASS   2002  . HYSTEROSCOPY WITH D & C   10/14/2011    Procedure: DILATATION AND CURETTAGE (D&C) /HYSTEROSCOPY;  Surgeon: Arloa Koh;  Location: [facility];  Service: Gynecology;  Laterality: Bilateral;  . LASIK      . NOSE SURGERY      . STOMACH SURGERY   04/20/2015    dilation of "connection between stomach and intestine", [facility]  . TONSILLECTOMY      . TONSILLECTOMY      . TUBAL LIGATION   2008            Current Outpatient Medications  Medication Sig Dispense Refill  . AIMOVIG 140 MG/ML SOAJ every 30 (thirty) days.      . ALPRAZolam (XANAX) 1 MG tablet Take 1 mg by mouth 2 (two) times daily as needed. Anxiety        . Armodafinil 250 MG tablet Take 250 mg by mouth every morning.   2  . baclofen (LIORESAL) 10 MG tablet Take by mouth.      . botulinum toxin Type A (BOTOX) 100 units SOLR injection once. Unaware of dose, pt gets q 81mo for migraines      . Botulinum Toxin Type A, Cosm, 100 units SOLR once. Unaware of dose, pt gets q 64mo for migraines      . CONTRAVE 8-90 MG TB12 Take 2 tablets by mouth 2 (two) times daily.      . cyanocobalamin (,VITAMIN B-12,) 1000 MCG/ML injection every twenty-one (21)  days.      Marland Kitchen DEXILANT 60 MG capsule Take 60 mg by mouth daily.      . diphenhydrAMINE (BENADRYL) 12.5 MG/5ML elixir Take 12.5 mg by mouth every 6 (six) hours as needed.       . divalproex (DEPAKOTE) 500 MG DR tablet Take 1 tablet (500 mg total) by mouth 2  (two) times daily. 180 tablet 4  . doxepin (SINEQUAN) 75 MG capsule 75 mg at bedtime.       Marland Kitchen eletriptan (RELPAX) 40 MG tablet Take 1 tablet (40 mg total) by mouth as needed for migraine or headache. May repeat in 2 hours if needed. Max 2 tabs per day or 8 tabs per month. 10 tablet 12  . ergocalciferol (VITAMIN D2) 50000 UNITS capsule Take 50,000 Units by mouth. She is taking daily M-F - pr pt      . Eszopiclone 3 MG TABS TK 1 T PO QD HS   1  . fexofenadine (ALLEGRA) 180 MG tablet Take 180 mg by mouth daily as needed. allergies       . fluticasone (FLONASE) 50 MCG/ACT nasal spray Place 1 spray into both nostrils as needed.   1  . gabapentin (NEURONTIN) 300 MG capsule Take one pill at night for 1 week.  Increase to 2 pills at night for 1 week.  Increase to 3 pills at night until seen      . hydrOXYzine (ATARAX/VISTARIL) 25 MG tablet TAKE 1 TABLET BY MOUTH AS NEEDED FOR HEADACHE UPTO EVERY 8 HOURS   2  . imipramine (TOFRANIL) 50 MG tablet Take 2 tablets by mouth daily.   0  . lansoprazole (PREVACID) 30 MG capsule Take 30 mg by mouth daily as needed. Acid reflux        . Lasmiditan Succinate 100 MG TABS Take by mouth.      . levothyroxine (SYNTHROID) 50 MCG tablet TAKE 1 TABLET BY MOUTH EVERY DAY 30 MINUTES BEFORE EATING 90 tablet 0  . LINZESS 145 MCG CAPS capsule Take 145 mcg by mouth daily.   3  . Multiple Vitamins-Minerals (MULTIVITAMIN WITH MINERALS) tablet Take 1 tablet by mouth daily.        Marland Kitchen MYRBETRIQ 25 MG TB24 tablet TAKE 1 TABLET(25 MG) BY MOUTH DAILY 30 tablet 6  . nystatin (MYCOSTATIN/NYSTOP) powder once as needed.      . nystatin-triamcinolone (MYCOLOG II) cream Apply 1 application topically 2 (two) times daily. Apply to affected area BID for up to 7 days. 60 g 0  . omeprazole (PRILOSEC) 20 MG capsule Take by mouth.      . ondansetron (ZOFRAN) 4 MG tablet Take 1 tablet by mouth as needed.      Marland Kitchen OZEMPIC, 0.25 OR 0.5 MG/DOSE, 2 MG/1.5ML SOPN SMARTSIG:0.25 Milligram(s) SUB-Q Once a Week       . promethazine (PHENERGAN) 12.5 MG tablet TAKE 1 TABLET(12.5 MG) BY MOUTH EVERY 8 HOURS AS NEEDED FOR NAUSEA OR VOMITING 15 tablet 0  . promethazine (PROMETHEGAN) 25 MG suppository SMARTSIG:1 SUPPOS Rectally Every 6 Hours PRN      . ranitidine (ZANTAC) 150 MG capsule        . Semaglutide,0.25 or 0.5MG /DOS, 2 MG/1.5ML SOPN Inject into the skin.      Marland Kitchen thiamine (VITAMIN B-1) 50 MG tablet daily.   0  . tiZANidine (ZANAFLEX) 4 MG capsule Take 2-4 mg by mouth at bedtime.   0  . UBRELVY 50 MG TABS daily.      Marland Kitchen venlafaxine  XR (EFFEXOR-XR) 150 MG 24 hr capsule Take 1 capsule by mouth daily.   0  . WELLBUTRIN XL 300 MG 24 hr tablet TK 1 T PO  D QAM   1  . zonisamide (ZONEGRAN) 50 MG capsule Take 50 mg by mouth at bedtime.      . potassium chloride SA (K-DUR) 20 MEQ tablet Take 1 tablet (20 mEq total) by mouth daily for 10 days. 10 tablet 0    No current facility-administered medications for this visit.      ALLERGIES: Naproxen, Monistat [miconazole], Sulfa antibiotics, and Tioconazole        Family History  Problem Relation Age of Onset  . Diabetes Mother    . Heart disease Father    . Social phobia Father    . Psychiatric Illness Father    . Hypertension Father    . Stroke Father    . Dementia Father    . Heart disease Other    . Diabetes Other    . Stroke Other    . Hypertension Maternal Grandmother    . Thyroid disease Maternal Grandmother    . Hypertension Paternal Grandmother    . Stroke Paternal Grandfather    . Breast cancer Neg Hx        Social History         Socioeconomic History  . Marital status: Divorced      Spouse name: Not on file  . Number of children: 1  . Years of education: College  . Highest education level: Not on file  Occupational History      Employer: [employer]  Tobacco Use  . Smoking status: Never Smoker  . Smokeless tobacco: Never Used  Vaping Use  . Vaping Use: Never used  Substance and Sexual Activity  . Alcohol use: Yes       Alcohol/week: 0.0 standard drinks      Comment: Once a month-rarely  . Drug use: No  . Sexual activity: Not Currently      Partners: Male      Birth control/protection: Surgical      Comment: tubal  Other Topics Concern  . Not on file  Social History Narrative    Pt lives at home with her family.    Caffeine Use: once daily    Social Determinants of Health       Financial Resource Strain:   . Difficulty of Paying Living Expenses: Not on file  Food Insecurity:   . Worried About Charity fundraiser in the Last Year: Not on file  . Ran Out of Food in the Last Year: Not on file  Transportation Needs:   . Lack of Transportation (Medical): Not on file  . Lack of Transportation (Non-Medical): Not on file  Physical Activity:   . Days of Exercise per Week: Not on file  . Minutes of Exercise per Session: Not on file  Stress:   . Feeling of Stress : Not on file  Social Connections:   . Frequency of Communication with Friends and Family: Not on file  . Frequency of Social Gatherings with Friends and Family: Not on file  . Attends Religious Services: Not on file  . Active Member of Clubs or Organizations: Not on file  . Attends Archivist Meetings: Not on file  . Marital Status: Not on file  Intimate Partner Violence:   . Fear of Current or Ex-Partner: Not on file  . Emotionally Abused: Not on file  .  Physically Abused: Not on file  . Sexually Abused: Not on file      Review of Systems  Genitourinary: Positive for urgency.  All other systems reviewed and are negative.     PHYSICAL EXAMINATION:     BP 120/82   Pulse (!) 105   Ht 5\' 3"  (1.6 m)   Wt 201 lb (91.2 kg)   SpO2 99%   BMI 35.61 kg/m     General appearance: alert, cooperative and appears stated age Head: Normocephalic, without obvious abnormality, atraumatic Neck: no adenopathy, supple, symmetrical, trachea midline and thyroid normal to inspection and palpation Lungs: clear to auscultation  bilaterally Breasts: normal appearance, no masses or tenderness, No nipple retraction or dimpling, No nipple discharge or bleeding, No axillary or supraclavicular adenopathy Heart: regular rate and rhythm Abdomen: pannus with erythema under skin fold, soft, non-tender, no masses,  no organomegaly Extremities: extremities normal, atraumatic, no cyanosis or edema Skin: Skin color, texture, turgor normal. No rashes or lesions Lymph nodes: Cervical, supraclavicular, and axillary nodes normal. No abnormal inguinal nodes palpated Neurologic: Grossly normal   Pelvic: External genitalia:  no lesions              Urethra:  normal appearing urethra with no masses, tenderness or lesions              Bartholins and Skenes: normal                 Vagina: normal appearing vagina with normal color and discharge, no lesions              Cervix: no lesions, short in length.                 Bimanual Exam:  Uterus:  normal size, contour, position, consistency, normal mobility, non-tender              Adnexa: no mass, fullness, tenderness              Rectal exam: Yes.  .  Confirms.              Anus:  normal sphincter tone, no lesions   Chaperone was present for exam.   ASSESSMENT   Abnormal uterine bleeding.  Urinary frequency.  Abnormal urine dip.  Hx conization and multiple LEEPs.  Hx weight loss surgery with revision and perforated bowel during the revision surgery, which was recognized and corrected. Hypothyroidism.  Back on thyroid replacement. Anaprox allergy.  No NSAIDs. Candida of skin.    PLAN   Pelvic US and EMB.  Will check TFTs and FSH, E2. Urine micro and culture.  Continue Synthroid. Diflucan course.  Will complete her Covid vaccine prior to surgery.   Will plan for laparoscopic hysterectomy with bilateral salpingectomy and possible bilateral oophorectomy, cystoscopy.     I reviewed risks, benefits, and alternatives.  Risks include but are not limited to bleeding, infection,  damage to surrounding organs, pneumonia, reaction to anesthesia, DVT, PE, death, need for reoperation, hernia formation, vaginal cuff dehiscence, need to convert to a traditional laparotomy incision to complete the procedure, inability to remove HPV from her body, and possible vaginal dysplasia requiring treatment in the future. Surgical expectations and recovery discussed.  Patient wishes to proceed. She will receive Lovenox for DVT/PE prophylaxis. Plan for liquid narcotic following surgery.    Questions invited and answered.   45 minutes total time was spent for this patient encounter, including preparation, face-to-face counseling with the patient, coordination of care, and  documentation of the encounter.

## 2020-10-26 NOTE — Transfer of Care (Signed)
Immediate Anesthesia Transfer of Care Note  Patient: Megan Davila  Procedure(s) Performed: Procedure(s) (LRB): TOTAL LAPAROSCOPIC HYSTERECTOMY WITH BILATERAL SALPINGECTOMY (N/A) CYSTOSCOPY (N/A)  Patient Location: PACU  Anesthesia Type: General  Level of Consciousness: awake, sedated, patient cooperative and responds to stimulation  Airway & Oxygen Therapy: Patient Spontanous Breathing and Patient connected to [place] 02 and soft FM   Post-op Assessment: Report given to PACU RN, Post -op Vital signs reviewed and stable and Patient moving all extremities  Post vital signs: Reviewed and stable  Complications: No apparent anesthesia complications

## 2020-10-26 NOTE — Progress Notes (Signed)
Update to History and Physical  Patient had evaluation for postmenopausal bleeding preop.  Her pelvic US showed EMS 5.07 mm and she had fluid in the endometrial canal.  No myometrial masses were noted.  Her left ovary contained a 1.9 cm simple cyst and her right ovary was normal. No free fluid was noted.   Her endometrial biopsy showed scant fragments of benign inactive endometrium, which was negative for hyperplasia and malignancy.  Her pap showed ASCUS and positive HR HPV.  Patient examined.   OK to proceed with surgery.

## 2020-10-26 NOTE — Op Note (Addendum)
OPERATIVE REPORT  PREOPERATIVE DIAGNOSIS:   Recurrent cervical dysplasia, positive high risk HPV  POSTOPERATIVE DIAGNOSIS:  Recurrent cervical dysplasia, positive high risk HPV  PROCEDURES:  Total laparoscopic hysterectomy with bilateral salpingectomy, cystoscopy  SURGEON:  Lenard Galloway, M.D.  ASSISTANT:   Dorothy Spark, M.D.  ANESTHESIA:  General endotracheal, intraperitoneal ropivicaine 30 mL diluted in 30 mL of normal saline, local with 0.25% Marcaine.  IVF:   1700 cc LR  ESTIMATED BLOOD LOSS:   25 cc  URINE OUTPUT:   20 cc  COMPLICATIONS:  None.  INDICATIONS FOR THE PROCEDURE:     The patient presents with a history of cervical conization for CIN III and multiple LEEP procedures for unsatisfactory colposcopies who presents for hysterectomy procedure.   Preop operatively, the patient experienced postmenopausal bleeding and had a pelvic US showing an endometrial stripe of 5.07 mm and a 1.9 cm simple left ovarian cyst.  Her endometrial biopsy was benign and her pap showed ASCUS with positive high risk HPV.    A plan is made to proceed with a total laparoscopic hysterectomy with bilateral salpingectomy, possible bilateral oophorectomy, and cystoscopy after risks, benefits, and alternatives are reviewed.  FINDINGS:     Laparoscopy revealed a normal uterus, bilateral tubes and right ovary.  Her left ovary showed a 2 cm simple appearing cyst. Her fallopian tubes were consistent with tubal ligation.  The upper abdomen was normal and demonstrated a normal liver.  There was minor adhesive adhesive disease in the left upper abdomen.   Cystoscopy at the termination of the procedure showed the bladder to be normal throughout 360 degrees including the bladder dome and trigone. There was no evidence of any foreign body in the bladder or the urethra. There was no evidence of any lesions  of the bladder or the urethra.   Both of the ureters were noted to be patent bilaterally.  SPECIMENS:      The uterus, cervix, and bilateral tubes were went to pathology.   DESCRIPTION OF PROCEDURE:    The patient was reidentified in the preoperative hold area.   She did receive Flagyl and Ciprofloxacin IV for antibiotic prophylaxis.  She received Lovenox, TED hose and PAS stockings for DVT prophylaxis.  In the operating room, the patient was placed in the dorsal lithotomy position on the operating room table.  The Trendguard was used to support her on the OR table.  Her legs were placed in the Strawn stirrups and her arms were both padded and tucked at her sides. The patient received general endotracheal anesthesia. The abdomen and vagina were then sterilely prepped and she was sterilely draped.  A speculum was placed in the vagina and a single-tooth tenaculum was placed on the anterior cervical lip.  A figure-of-eight suture of 0 Vicryl was placed on each the anterior and the posterior cervical lips. The uterus was sounded to 6 cm.  The cervix was then dilated with [facility] dilators.  A # 6 RUMI tip with a small metal KOH ring was then placed through the cervix and into the uterine cavity without difficulty.  The remaining vaginal instruments were then removed.  A Foley catheter was placed inside the bladder.  Attention was turned to the abdomen where the umbilical region was injected with 0.25% Marcaine and a small incision created.  A Veress needle was then used to insufflate the abdomen with CO2 gas after a saline drop test was performed and the fluid flowed freely.  A 5 mm umbilical  incision was created with a scalpel after the skin. A 5 mm camera port was then placed using the Optiview.  5 mm incisions were then created in the left upper abdomen and the left lower abdomen after the skin was injected locally with 0.25% Marcaine.  The 5 mm trocars were then placed under visualization of the laparoscope.  An 5 mm Air Seal was placed in the right lower quadrant after injecting with Marcaine and incising with  a scalpel.     Ropivicine was placed inside the peritoneal cavity.   The patient was placed in Trendelenburg position.  An inspection of the abdomen and pelvis was performed. The findings are as noted above.   The left ureter was not seen but the right ureter was seen.  The left fallopian tube was grasped and the Ligasure was used to cauterize and cut through the mesosalpinx and then the proximal tube.  The specimen was sent to pathology.  The left utero-ovarian ligament was similarly cauterized and cut with the Ligasure.  The left round ligament was then cauterized and divided with same instrument.  Dissection was performed to the anterior and posterior leaves of the broad ligaments using the Harmonic scalpel.  The incision was carried across the anterior cul- de-sac along the vesicouterine fold and the bladder was dissected away from the cervix using the Harmonic scalpel. The peritoneum was taken down posteriorly.  The left uterine artery was skeletonized.   It was then cauterized and cut with the Ligasure instrument.  Attention was turned to the patient's right-hand side at this time.  The same procedure that was performed on the left side was repeated on the right side with respect to the isolation, cautery, and transection of the vessels and the bladder flap dissection.   The right fallopian tube was removed from the peritoneal cavity as well.  The KOH ring was nicely visible.  The colpotomy incision was performed with the Harmonic scalpel in a circumferential fashion. The specimen was then removed from the peritoneal cavity and was later sent to Pathology.  The vaginal cuff was sutured at this time using a running suture of 0 V-Loc.  The vagina was closed from the patient's right hand side to the left hand side and then back 2 sutures.  This provided good full-thickness closure of the vaginal cuff.  The laparoscopic needle for suturing was removed from the peritoneal cavity.  The pelvis was  irrigated and suctioned.   The pneumoperitoneal was let down.  There was minor bleeding of the junction of the bladder to the cervix and an edge of the mid vagina.  Hemostasis was created with the Ligasure and Harmonic scalpel. There was good hemostasis of the operative sites and pedicles.  Carleene Overlie was placed in the pelvis over the operating sites.   The CO2 pneumoperitoneum was released and the trocars were removed.  The patient received manual breaths to remove any remaining CO2.    The patient's Foley catheter was removed, and cystoscopy was performed.  The findings are as noted above.  The Foley catheter was left out.   Final inspection of the vagina demonstrated good hemostasis of the vaginal cuff.  Skin incisions were closed with subcuticular sutures of 4-0 Vicryl with the exception of the umbilical incision.  Dermabond was placed over all incisions.  This concluded the patient's procedure.  She was extubated and escorted to the recovery room in stable and awake condition.  There were no complications to the procedure.  All  needle, instrument, and sponge counts were correct.  An MD assistant was necessary for tissue manipulation, management of instrumentation, retraction and positioning due to the complexity of the case.

## 2020-10-26 NOTE — Anesthesia Procedure Notes (Signed)
Procedure Name: Intubation Date/Time: 10/26/2020 7:52 AM Performed by: Justice Rocher, CRNA Pre-anesthesia Checklist: Patient identified, Emergency Drugs available, Suction available, Patient being monitored and Timeout performed Patient Re-evaluated:Patient Re-evaluated prior to induction Oxygen Delivery Method: Circle system utilized Preoxygenation: Pre-oxygenation with 100% oxygen Induction Type: IV induction Ventilation: Mask ventilation without difficulty Laryngoscope Size: Mac and 3 Grade View: Grade II Tube type: Oral Tube size: 7.0 mm Number of attempts: 1 Airway Equipment and Method: Stylet and Oral airway Placement Confirmation: ETT inserted through vocal cords under direct vision,  positive ETCO2,  breath sounds checked- equal and bilateral and CO2 detector Secured at: 23 cm Tube secured with: Tape Dental Injury: Teeth and Oropharynx as per pre-operative assessment

## 2020-10-26 NOTE — Anesthesia Postprocedure Evaluation (Signed)
Anesthesia Post Note  Patient: Megan Davila  Procedure(s) Performed: TOTAL LAPAROSCOPIC HYSTERECTOMY WITH BILATERAL SALPINGECTOMY (N/A Abdomen) CYSTOSCOPY (N/A Bladder)     Patient location during evaluation: PACU Anesthesia Type: General Level of consciousness: awake and alert Pain management: pain level controlled Vital Signs Assessment: post-procedure vital signs reviewed and stable Respiratory status: spontaneous breathing, nonlabored ventilation, respiratory function stable and patient connected to nasal cannula oxygen Cardiovascular status: blood pressure returned to baseline and stable Postop Assessment: no apparent nausea or vomiting Anesthetic complications: no   No complications documented.  Last Vitals:  Vitals:   10/26/20 1145 10/26/20 1215  BP: 122/78 106/77  Pulse: 77 77  Resp: 16 16  Temp: 36.6 C 36.5 C  SpO2: 97% 97%    Last Pain:  Vitals:   10/26/20 1215  TempSrc:   PainSc: 9                  Colin Norment L Ziara Thelander

## 2020-10-26 NOTE — Progress Notes (Deleted)
GYNECOLOGY  VISIT   HPI: 54 y.o.   Divorced  Caucasian  female   G1P1 with Patient's last menstrual period was 03/20/2019 (approximate).   here for 1 week status post TOTAL LAPAROSCOPIC HYSTERECTOMY WITH BILATERAL SALPINGECTOMY (N/A Abdomen) CYSTOSCOPY (N/A Bladder)Procedures.      GYNECOLOGIC HISTORY: Patient's last menstrual period was 03/20/2019 (approximate). Contraception: Tubal/Hyst/Bil.Salpingectomy Menopausal hormone therapy:  none Last mammogram:  04-01-20 3D/Lt.Br.poss.mass;Rt.Br.Neg--Lt.Br.Diag was Neg/density B/BiRads1 Last pap smear: 10-15-20 ASCUS:Pos HR HPV                         04/22/20 LGSIL:Pos HR HPV 10-22-19 ASCUS:Pos HRHPV 05-02-19 Neg:Neg HR HPV 09-25-18 Neg:Neg HR HPV        OB History    Gravida  1   Para  1   Term      Preterm      AB      Living  1     SAB      TAB      Ectopic      Multiple  1   Live Births                 Patient Active Problem List   Diagnosis Date Noted  . Status post laparoscopic hysterectomy 10/26/2020  . Vitamin D deficiency 05/23/2017  . Anxiety 06/19/2015  . Anemia, iron deficiency 06/19/2015  . Pernicious anemia 06/19/2015  . 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 Protein-calorie malnutrition, severe ([place]) 04/18/2015  . Severe protein-calorie malnutrition ([place]) 04/18/2015  . 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 Bariatric surgery status 04/18/2015  . 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 GERD (gastroesophageal reflux disease) 04/17/2015  . Hypokalemia 04/16/2015  . Migraine with aura 05/21/2014  . 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 Abnormal Pap smear   . Anemia   . Anxiety   . Arthritis    neck  . Broken toe 12-22-15   middle toe right foot  . Cataract of both eyes   . Depression   . GERD  (gastroesophageal reflux disease)   . Headache(784.0)   . Heart murmur   . History of hiatal hernia 2021   small  . History of kidney stones   . Hypothyroidism   . 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 Migraine   . Perforated bowel ([place])   . Pernicious anemia 06/19/2015  . PONV (postoperative nausea and vomiting)   . Thyroid disease    hypothyrodism    Past Surgical History:  Procedure Laterality Date  . CATARACT EXTRACTION, BILATERAL Bilateral 02/2016  . CERVICAL BIOPSY  W/ LOOP ELECTRODE EXCISION  11/2013, 05/2018, 11/2019   LGSIL, negative for dysplasia, LGSIL and positive ECC with potential LGSIL  - respectively  . 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 CESAREAN SECTION  2008  . CHOLECYSTECTOMY    . COLONOSCOPY  10/2020  . GASTRIC BYPASS  2002  . HYSTEROSCOPY WITH D & C  10/14/2011   Procedure: DILATATION AND CURETTAGE (D&C) /HYSTEROSCOPY;  Surgeon: Arloa Koh;  Location: [facility];  Service: Gynecology;  Laterality: Bilateral;  . LASIK    . NOSE SURGERY    . STOMACH SURGERY  04/20/2015   dilation of "connection between stomach and intestine", [facility]  . TONSILLECTOMY    . TONSILLECTOMY    . TUBAL LIGATION  2008    No  current facility-administered medications for this visit.   No current outpatient medications on file.   Facility-Administered Medications Ordered in Other Visits  Medication Dose Route Frequency Provider Last Rate Last Admin  . fentaNYL (SUBLIMAZE) injection 25-50 mcg  25-50 mcg Intravenous Q5 min PRN Hulan Fray L, MD   50 mcg at 10/26/20 1117  . HYDROcodone-acetaminophen (HYCET) 7.5-325 mg/15 ml solution 10 mL  10 mL Oral Q4H PRN Nunzio Cobbs, MD      . lactated ringers infusion   Intravenous Continuous Darral Dash, DO 800 mL/hr at 10/26/20 0829 New Bag at 10/26/20 0829  . lactated ringers infusion   Intravenous Continuous Nunzio Cobbs, MD 125 mL/hr at  10/26/20 1308 New Bag at 10/26/20 1308  . menthol-cetylpyridinium (CEPACOL) lozenge 3 mg  1 lozenge Oral Q2H PRN Nunzio Cobbs, MD      . morphine 4 MG/ML injection 1-2 mg  1-2 mg Intravenous Q3H PRN Nunzio Cobbs, MD   1 mg at 10/26/20 1306  . ondansetron (ZOFRAN) tablet 4 mg  4 mg Oral Q6H PRN Nunzio Cobbs, MD       Or  . ondansetron [facility]) injection 4 mg  4 mg Intravenous Q6H PRN Yisroel Ramming, Brook E, MD      . povidone-iodine 10 % swab 2 application  2 application Topical Once Nunzio Cobbs, MD      . scopolamine (TRANSDERM-SCOP) 1 MG/3DAYS 1.5 mg  1 patch Transdermal Q72H Woodrum, Chelsey L, MD   1.5 mg at 10/26/20 3875     ALLERGIES: Naproxen, Monistat [miconazole], Sulfa antibiotics, and Tioconazole  Family History  Problem Relation Age of Onset  . Diabetes Mother   . Heart disease Father   . Social phobia Father   . Psychiatric Illness Father   . Hypertension Father   . Stroke Father   . Dementia Father   . Heart disease Other   . Diabetes Other   . Stroke Other   . Hypertension Maternal Grandmother   . Thyroid disease Maternal Grandmother   . Hypertension Paternal Grandmother   . Stroke Paternal Grandfather   . Breast cancer Neg Hx     Social History   Socioeconomic History  . Marital status: Divorced    Spouse name: Not on file  . Number of children: 1  . Years of education: College  . Highest education level: Not on file  Occupational History    Employer: [employer]  Tobacco Use  . Smoking status: Never Smoker  . Smokeless tobacco: Never Used  Vaping Use  . Vaping Use: Never used  Substance and Sexual Activity  . Alcohol use: Yes    Alcohol/week: 0.0 standard drinks    Comment: Once a month-rarely  . Drug use: No  . Sexual activity: Not Currently    Partners: Male    Birth control/protection: Surgical    Comment: tubal  Other Topics Concern  . Not on file  Social History Narrative   Pt  lives at home with her family.   Caffeine Use: once daily   Social Determinants of Health   Financial Resource Strain:   . Difficulty of Paying Living Expenses: Not on file  Food Insecurity:   . Worried About Charity fundraiser in the Last Year: Not on file  . Ran Out of Food in the Last Year: Not on file  Transportation Needs:   .  Lack of Transportation (Medical): Not on file  . Lack of Transportation (Non-Medical): Not on file  Physical Activity:   . Days of Exercise per Week: Not on file  . Minutes of Exercise per Session: Not on file  Stress:   . Feeling of Stress : Not on file  Social Connections:   . Frequency of Communication with Friends and Family: Not on file  . Frequency of Social Gatherings with Friends and Family: Not on file  . Attends Religious Services: Not on file  . Active Member of Clubs or Organizations: Not on file  . Attends Archivist Meetings: Not on file  . Marital Status: Not on file  Intimate Partner Violence:   . Fear of Current or Ex-Partner: Not on file  . Emotionally Abused: Not on file  . Physically Abused: Not on file  . Sexually Abused: Not on file    Review of Systems  PHYSICAL EXAMINATION:    LMP 03/20/2019 (Approximate)     General appearance: alert, cooperative and appears stated age Head: Normocephalic, without obvious abnormality, atraumatic Neck: no adenopathy, supple, symmetrical, trachea midline and thyroid normal to inspection and palpation Lungs: clear to auscultation bilaterally Breasts: normal appearance, no masses or tenderness, No nipple retraction or dimpling, No nipple discharge or bleeding, No axillary or supraclavicular adenopathy Heart: regular rate and rhythm Abdomen: soft, non-tender, no masses,  no organomegaly Extremities: extremities normal, atraumatic, no cyanosis or edema Skin: Skin color, texture, turgor normal. No rashes or lesions Lymph nodes: Cervical, supraclavicular, and axillary nodes  normal. No abnormal inguinal nodes palpated Neurologic: Grossly normal  Pelvic: External genitalia:  no lesions              Urethra:  normal appearing urethra with no masses, tenderness or lesions              Bartholins and Skenes: normal                 Vagina: normal appearing vagina with normal color and discharge, no lesions              Cervix: no lesions                Bimanual Exam:  Uterus:  normal size, contour, position, consistency, mobility, non-tender              Adnexa: no mass, fullness, tenderness              Rectal exam: {yes no:[id number]}.  Confirms.              Anus:  normal sphincter tone, no lesions  Chaperone was present for exam.  ASSESSMENT     PLAN     An After Visit Summary was printed and given to the patient.  ______ minutes face to face time of which over 50% was spent in counseling.

## 2020-10-26 NOTE — Progress Notes (Signed)
Day of Surgery Procedure(s) (LRB): TOTAL LAPAROSCOPIC HYSTERECTOMY WITH BILATERAL SALPINGECTOMY (N/A) CYSTOSCOPY (N/A)  Subjective: Called by nurse for right lower incisional hematoma.   I recommended a pressure dressing and ice pack.   Patient reporting pain on her right side.   Patient has voided 20 cc since surgery.   Tolerating po liquids.  No flatus but feels pressure to have a BM.   Ambulated.   Objective: I have reviewed patient's vital signs, intake and output, medications and labs. Vitals:   10/26/20 1500 10/26/20 1544  BP: 133/79 134/76  Pulse: 99 98  Resp: 16 16  Temp: 98.2 F (36.8 C)   SpO2: 98% 98%   I/O - 1850 cc/65 cc  Sterile cath at bedside by me now:  Sterile prep with betadine:  250 cc concentrated urine.   CBC    Component Value Date/Time   WBC 9.4 10/26/2020 1528   RBC 4.14 10/26/2020 1528   HGB 12.9 10/26/2020 1528   HGB 15.1 (H) 10/14/2020 1506   HGB 14.8 10/18/2017 1630   HGB 15.9 05/22/2017 1343   HCT 40.0 10/26/2020 1528   HCT 44.7 10/18/2017 1630   HCT 47.4 (H) 05/22/2017 1343   PLT 241 10/26/2020 1528   PLT 297 10/14/2020 1506   PLT 275 10/18/2017 1630   MCV 96.6 10/26/2020 1528   MCV 99 (H) 10/18/2017 1630   MCV 96.0 05/22/2017 1343   MCH 31.2 10/26/2020 1528   MCHC 32.3 10/26/2020 1528   RDW 11.6 10/26/2020 1528   RDW 11.9 (L) 10/18/2017 1630   RDW 12.0 05/22/2017 1343   LYMPHSABS 2.7 10/14/2020 1506   LYMPHSABS 1.8 10/18/2017 1630   LYMPHSABS 2.4 05/22/2017 1343   MONOABS 0.5 10/14/2020 1506   MONOABS 0.5 05/22/2017 1343   EOSABS 0.1 10/14/2020 1506   EOSABS 0.1 10/18/2017 1630   BASOSABS 0.0 10/14/2020 1506   BASOSABS 0.0 10/18/2017 1630   BASOSABS 0.0 05/22/2017 1343     General: cooperative, mild distress and looks tired Resp: clear to auscultation bilaterally Cardio: regular rate and rhythm, S1, S2 normal, no murmur, click, rub or gallop GI: positive bowel sounds, soft.  Tender around right incision which has a  5 cm subcutaneous hematoma, nonexpanding, and tender.  Other incisions intact.  Extremities: PAS and Ted hose on.  Vaginal Bleeding: none  Assessment: s/p Procedure(s): TOTAL LAPAROSCOPIC HYSTERECTOMY WITH BILATERAL SALPINGECTOMY (N/A) CYSTOSCOPY (N/A): incisional hematoma.   Inadequate pain control.   Plan: Encourage ambulation Will switch to Dilaudid IV for pain control.   IVF bolus now.  Regular diet.  Pressure dressing and ice pack overnight.  CBC in am.  Surgical findings and procedure reviewed.    LOS: 0 days    Arloa Koh 10/26/2020, 4:58 PM

## 2020-10-27 ENCOUNTER — Encounter (HOSPITAL_BASED_OUTPATIENT_CLINIC_OR_DEPARTMENT_OTHER): Payer: Self-pay | Admitting: Obstetrics and Gynecology

## 2020-10-27 DIAGNOSIS — N879 Dysplasia of cervix uteri, unspecified: Secondary | ICD-10-CM | POA: Diagnosis not present

## 2020-10-27 LAB — CBC
HCT: 35.6 % — ABNORMAL LOW (ref 36.0–46.0)
Hemoglobin: 11.7 g/dL — ABNORMAL LOW (ref 12.0–15.0)
MCH: 31.8 pg (ref 26.0–34.0)
MCHC: 32.9 g/dL (ref 30.0–36.0)
MCV: 96.7 fL (ref 80.0–100.0)
Platelets: 243 10*3/uL (ref 150–400)
RBC: 3.68 MIL/uL — ABNORMAL LOW (ref 3.87–5.11)
RDW: 11.9 % (ref 11.5–15.5)
WBC: 8.5 10*3/uL (ref 4.0–10.5)
nRBC: 0 % (ref 0.0–0.2)

## 2020-10-27 MED ORDER — HYDROMORPHONE HCL 1 MG/ML IJ SOLN
INTRAMUSCULAR | Status: AC
  Filled 2020-10-27: qty 1

## 2020-10-27 MED ORDER — HYDROMORPHONE HCL 2 MG PO TABS
2.0000 mg | ORAL_TABLET | ORAL | 0 refills | Status: DC | PRN
Start: 2020-10-27 — End: 2020-11-05

## 2020-10-27 NOTE — Discharge Instructions (Signed)

## 2020-10-27 NOTE — Progress Notes (Signed)
1 Day Post-Op Procedure(s) (LRB): TOTAL LAPAROSCOPIC HYSTERECTOMY WITH BILATERAL SALPINGECTOMY (N/A) CYSTOSCOPY (N/A)  Subjective: Patient reports incisional pain, tolerating PO and no problems voiding.   Ambulating.  Received Dilaudid IV overnight for pain control.   Objective: I have reviewed patient's vital signs, intake and output and labs. Vitals:   10/27/20 0320 10/27/20 0641  BP: 91/60 (!) 90/53  Pulse: 72 67  Resp: 16 18  Temp: 97.8 F (36.6 C) 97.8 F (36.6 C)  SpO2: 93% 97%    CBC    Component Value Date/Time   WBC 8.5 10/27/2020 0734   RBC 3.68 (L) 10/27/2020 0734   HGB 11.7 (L) 10/27/2020 0734   HGB 15.1 (H) 10/14/2020 1506   HGB 14.8 10/18/2017 1630   HGB 15.9 05/22/2017 1343   HCT 35.6 (L) 10/27/2020 0734   HCT 44.7 10/18/2017 1630   HCT 47.4 (H) 05/22/2017 1343   PLT 243 10/27/2020 0734   PLT 297 10/14/2020 1506   PLT 275 10/18/2017 1630   MCV 96.7 10/27/2020 0734   MCV 99 (H) 10/18/2017 1630   MCV 96.0 05/22/2017 1343   MCH 31.8 10/27/2020 0734   MCHC 32.9 10/27/2020 0734   RDW 11.9 10/27/2020 0734   RDW 11.9 (L) 10/18/2017 1630   RDW 12.0 05/22/2017 1343   LYMPHSABS 2.7 10/14/2020 1506   LYMPHSABS 1.8 10/18/2017 1630   LYMPHSABS 2.4 05/22/2017 1343   MONOABS 0.5 10/14/2020 1506   MONOABS 0.5 05/22/2017 1343   EOSABS 0.1 10/14/2020 1506   EOSABS 0.1 10/18/2017 1630   BASOSABS 0.0 10/14/2020 1506   BASOSABS 0.0 10/18/2017 1630   BASOSABS 0.0 05/22/2017 1343   1490 cc/1515 cc.  General: alert and cooperative Resp: clear to auscultation bilaterally Cardio: regular rate and rhythm, S1, S2 normal, no murmur, click, rub or gallop GI: soft, non-tender; bowel sounds normal; no masses,  no organomegaly and incision: right incision with dissipating hematoma, which is flat but slightly indurated. Surrounding ecchymoses noted.  Slight skin separation but Dermabond present.   Other incisions intact. Extremities: Ted hose on and PAS on. Vaginal  Bleeding: none  Assessment: s/p Procedure(s): TOTAL LAPAROSCOPIC HYSTERECTOMY WITH BILATERAL SALPINGECTOMY (N/A) CYSTOSCOPY (N/A): progressing well  Right incisional hematoma improved and dissipating.   Plan: Discharge home  Follow up next week.  Discharge instructions reviewed in verbal and written form.  Surgical finding and procedure reviewed with patient.    LOS: 0 days    Megan Davila 10/27/2020, 7:57 AM

## 2020-10-28 LAB — SURGICAL PATHOLOGY

## 2020-10-30 ENCOUNTER — Telehealth: Payer: Self-pay | Admitting: Obstetrics & Gynecology

## 2020-10-30 NOTE — Telephone Encounter (Signed)
Pt called via on call paging service.  I was advised pt thought she had a low grade fever and was in significant pain.  I attempted to contact her several times.  Her name was on the voicemail and I did confirm this number as well.  It is the [phone] that was provided by the service.  Paging service attempted to contact pt as well and she did not answer.  I left a detailed message that she needed to be seen (in ER) if has temperature >100.5.  She was given my personal cell number to call back.  I did check to see if she went to the ER and several hours later, she had not been seen in the ER.  Routing to Dr. Quincy Simmonds, primary surgeon.

## 2020-10-30 NOTE — Discharge Summary (Signed)
Physician Discharge Summary  Patient ID: Megan Davila MRN: [id number] DOB/AGE: 08-05-1966 54 y.o.  Admit date: 10/26/2020 Discharge date: 10/27/20  Admission Diagnoses: Recurrent cervical dysplasia.  Discharge Diagnoses:  1.  Recurrent cervical dysplasia. 2.  Status post total laparoscopic hysterectomy with bilateral salpingectomy, cystoscopy.  3.  Right incisional hematoma, resolving.  Active Problems:   Status post laparoscopic hysterectomy   Discharged Condition: good  Hospital Course:  The patient was admitted on 10/26/20 for a total laparoscopic hysterectomy with bilateral salpingectomy, cystoscopy. which were performed without complication while under general anesthesia.  The patient's post op course was notable for a right incisional hematoma which was treated with a pressure dressing and ice.  She received morphine for pain control initially during her post op course, and this was inadequate for her. Dilaudid IV controlled her pain adequately while in the hospital.   She ambulated independently and wore PAS and Ted hose for DVT prophylaxis while in bed.  The patient had I and O catheterization post op for inability to void and developing abdominal pain.  It was uncertain initially if the patient's pain was from the incisional hematoma or urinary retention.  She was catheterized for 250 cc of concentrated urine.  She received and IV fluid bolus, and she voided spontaneously and good volumes following this. The patient's vital signs remained stable and she demonstrated no signs of infection during her hospitalization.  The patient's post op day zero Hgb was 12.9, and her post op day one Hgb was 11.7.  She was tolerating the this well.  She had very minimal vaginal bleeding, and her right incision hematoma dissipated by the day of discharge. She was found to be in good condition and ready for discharge on post op day one.  Consults: None  Significant Diagnostic Studies: labs:  See  hospital course.  Treatments: surgery:  total laparoscopic hysterectomy with bilateral salpingectomy, cystoscopy.  Discharge Exam: Blood pressure 118/71, pulse 95, temperature 98.2 F (36.8 C), resp. rate 16, height 5' 3.5" (1.613 m), weight 89.9 kg, last menstrual period 03/20/2019, SpO2 97 %. General: alert and cooperative Resp: clear to auscultation bilaterally Cardio: regular rate and rhythm, S1, S2 normal, no murmur, click, rub or gallop GI: soft, non-tender; bowel sounds normal; no masses,  no organomegaly and incision: right incision with dissipating hematoma, which is flat but slightly indurated. Surrounding ecchymoses noted.  Slight skin separation but Dermabond present.   Other incisions intact. Extremities: Ted hose on and PAS on. Vaginal Bleeding: none  Disposition: Discharge disposition: 01-Home or Self Care     Discharge instructions were reviewed in verba and written form.   Discharge Instructions    Discharge patient   Complete by: As directed    Discharge disposition: 01-Home or Self Care   Discharge patient date: 10/27/2020     Allergies as of 10/27/2020      Reactions   Naproxen Anaphylaxis   Monistat [miconazole] Other (See Comments)   "burning"   Sulfa Antibiotics Hives   Tioconazole Itching      Medication List    TAKE these medications   Aimovig 140 MG/ML Soaj Generic drug: Erenumab-aooe Inject 140 mg as directed every 30 (thirty) days.   ALPRAZolam 1 MG tablet Commonly known as: XANAX Take 1 mg by mouth 2 (two) times daily as needed. Anxiety   Armodafinil 250 MG tablet Take 250 mg by mouth every morning.   baclofen 10 MG tablet Commonly known as: LIORESAL Take 10 mg by mouth 2 (  two) times daily as needed for muscle spasms.   Botulinum Toxin Type A (Cosm) 100 units Solr once. Unaware of dose, pt gets q 70mo for migraines   botulinum toxin Type A 100 units Solr injection Commonly known as: BOTOX once. Unaware of dose, pt gets q 64mo for  migraines   Contrave 8-90 MG Tb12 Generic drug: Naltrexone-buPROPion HCl ER Take 2 tablets by mouth 2 (two) times daily.   cyanocobalamin 1000 MCG/ML injection Commonly known as: (VITAMIN B-12) Inject 1,000 mcg into the muscle every 21 ( twenty-one) days.   Dexilant 60 MG capsule Generic drug: dexlansoprazole Take 60 mg by mouth daily.   diphenhydrAMINE 12.5 MG/5ML elixir Commonly known as: BENADRYL Take 12.5 mg by mouth every 6 (six) hours as needed for allergies.   divalproex 500 MG DR tablet Commonly known as: DEPAKOTE Take 1 tablet (500 mg total) by mouth 2 (two) times daily.   doxepin 75 MG capsule Commonly known as: SINEQUAN Take 75 mg by mouth at bedtime.   doxycycline 100 MG tablet Commonly known as: ADOXA Take 1 tablet (100 mg total) by mouth 2 (two) times daily.   eletriptan 40 MG tablet Commonly known as: RELPAX Take 1 tablet (40 mg total) by mouth as needed for migraine or headache. May repeat in 2 hours if needed. Max 2 tabs per day or 8 tabs per month.   ergocalciferol 1.25 MG (50000 UT) capsule Commonly known as: VITAMIN D2 Take 50,000 Units by mouth See admin instructions. She is taking daily M-F - pr pt   Eszopiclone 3 MG Tabs Take 3 mg by mouth at bedtime.   fexofenadine 180 MG tablet Commonly known as: ALLEGRA Take 180 mg by mouth daily as needed. allergies   fluticasone 50 MCG/ACT nasal spray Commonly known as: FLONASE Place 1 spray into both nostrils as needed for allergies.   gabapentin 300 MG capsule Commonly known as: NEURONTIN Take 300-900 mg by mouth See admin instructions. Taking 1 (300mg ) at night for 1 week, then 2 capsules (600mg ) for 2nd week, then 3 capsules (900mg ) at night until seen   HYDROmorphone 2 MG tablet Commonly known as: Dilaudid Take 1 tablet (2 mg total) by mouth every 4 (four) hours as needed.   hydrOXYzine 25 MG tablet Commonly known as: ATARAX/VISTARIL Take 25 mg by mouth every 8 (eight) hours as needed  (headache).   imipramine 50 MG tablet Commonly known as: TOFRANIL Take 100 mg by mouth daily.   lansoprazole 30 MG capsule Commonly known as: PREVACID Take 30 mg by mouth daily as needed (acid reflux). Acid reflux   Lasmiditan Succinate 100 MG Tabs Take 100 mg by mouth as needed (headache onset).   levothyroxine 50 MCG tablet Commonly known as: SYNTHROID TAKE 1 TABLET BY MOUTH EVERY DAY 30 MINUTES BEFORE EATING What changed:   how much to take  how to take this  when to take this   Linzess 145 MCG Caps capsule Generic drug: linaclotide Take 145 mcg by mouth daily.   multivitamin with minerals tablet Take 1 tablet by mouth daily.   Myrbetriq 25 MG Tb24 tablet Generic drug: mirabegron ER TAKE 1 TABLET(25 MG) BY MOUTH DAILY What changed: See the new instructions.   nystatin powder Commonly known as: MYCOSTATIN/NYSTOP Apply 1 application topically daily as needed (yeast infection).   nystatin-triamcinolone cream Commonly known as: MYCOLOG II Apply 1 application topically 2 (two) times daily. Apply to affected area BID for up to 7 days. What changed:   when  to take this  reasons to take this  additional instructions   omeprazole 20 MG capsule Commonly known as: PRILOSEC Take 20 mg by mouth daily as needed (heartburn).   ondansetron 4 MG tablet Commonly known as: ZOFRAN Take 4 mg by mouth every 8 (eight) hours as needed for nausea or vomiting.   Ozempic (0.25 or 0.5 MG/DOSE) 2 MG/1.5ML Sopn Generic drug: Semaglutide(0.25 or 0.5MG /DOS) Inject 0.25 mg into the skin once a week. Sunday   promethazine 12.5 MG tablet Commonly known as: PHENERGAN TAKE 1 TABLET(12.5 MG) BY MOUTH EVERY 8 HOURS AS NEEDED FOR NAUSEA OR VOMITING What changed: See the new instructions.   Promethegan 25 MG suppository Generic drug: promethazine Place 25 mg rectally every 6 (six) hours as needed for nausea or vomiting. What changed: Another medication with the same name was changed.  Make sure you understand how and when to take each.   thiamine 50 MG tablet Commonly known as: VITAMIN B-1 Take 50 mg by mouth daily.   tiZANidine 4 MG tablet Commonly known as: ZANAFLEX Take 2-4 mg by mouth at bedtime.   Ubrelvy 50 MG Tabs Generic drug: Ubrogepant Take 50 mg by mouth daily as needed (migraine).   Wellbutrin XL 300 MG 24 hr tablet Generic drug: buPROPion Take 300 mg by mouth daily.   zonisamide 50 MG capsule Commonly known as: ZONEGRAN Take 50 mg by mouth at bedtime.       Follow-up Information    Nunzio Cobbs, MD In 1 week.   Specialty: Obstetrics and Gynecology Contact information: [address] [place] [address] [phone]               Signed: Arloa Koh 10/30/2020, 11:51 AM

## 2020-10-31 ENCOUNTER — Telehealth: Payer: Self-pay | Admitting: Obstetrics & Gynecology

## 2020-10-31 NOTE — Telephone Encounter (Signed)
54 yo G1P1 DWF who called on-call provider requesting pain medication.  Pt called yesterday as well but never answered with several attempts.  She was also given my number to call back but did not.  Today she states she is out of her pain medication prescribed for her post op pain.  Was given 2mg  hydromorphone #20 for post op pain.  She has been taking about 1 every 4 hours and is now out.  Yesterday, the call stated she had a low grade fever.  Today she reports it was 101.1 but has now normalized and is 98.8.  She is not having any vaginal bleeding or fever.  Had a bowel movement yesterday.  Is voiding normally.  Denies any other problems and feels well except is just having pain.  Has hx of prior bariatric surgery and cannot take ibuprofen.  Has tylenol but this isn't doing anything for her and she has tried it.  We discussed other OTC options/heating pad but she states she needs more pain medication.  As it is a weekend, she was advised this could not be called in her (STOP Act) and she must be seen in person.  She is going to go to the ER

## 2020-11-02 ENCOUNTER — Ambulatory Visit: Payer: Self-pay | Admitting: Obstetrics and Gynecology

## 2020-11-02 ENCOUNTER — Telehealth: Payer: Self-pay

## 2020-11-02 NOTE — Telephone Encounter (Signed)
Spoke with pt. Pt is 1 week post op from [facility] with BS. Pt states having abd pain that she describes as 10 on pain scale. Pt states ran out of Rx Dilaudid on Sat. Has taken 1 tablet every 4 hours as directed since surgery. Pt spoke with Dr Sabra Heck as oncall provider on Saturday and was advised to go to ER due to pain and fever. Pt states did not go due to potential out of pocket expense. Pt has not taken any OTC Tylenol since Saturday for pain. Pt cannot take Ibuprofen due to prior bariatric surgery.   Had last BM on 11/26 and is passing gas. Denies any nausea, vomiting, diarrhea. Denies any fever or chills today.  Pt also states has "open" right side lap site, denies drainage, warmth, redness, bleeding.   Pt advised to have OV for evaluation today. Pt unable to have OV today or tomorrow due to no ride. Pt states will keep post op appt scheduled for 12/2 at 1130 with Dr Quincy Simmonds. Pt agreeable.  Pt advised to go to ER for evaluation. Pt declines.   Routing to Dr Quincy Simmonds for update Encounter closed

## 2020-11-02 NOTE — Telephone Encounter (Signed)
Patient has questions regarding post op medication.

## 2020-11-04 NOTE — Progress Notes (Signed)
GYNECOLOGY  VISIT   HPI: 54 y.o.   Divorced  Caucasian  female   G1P1 with Patient's last menstrual period was 03/20/2019 (approximate).   here for 1 week status post TOTAL LAPAROSCOPIC HYSTERECTOMY WITH BILATERAL SALPINGECTOMY (N/A Abdomen) CYSTOSCOPY (N/A Bladder). Final pathology: LGSIL of cervix, inactive endometrium, benign fallopian tubes.   Patient states she is still in a lot of pain.  She has a right incisional hematoma, which was diagnosed post op while still in the hospital.  She states she has severe pain of the abdominal wall.    She was treated initially in the hospital with morphine for post op pain, and this was changed to dilaudid. She was discharged to home on oral dilaudid.   No vaginal bleeding.   Eating ok.  Not vomiting.  Having BMs.   Taking Metamucil.  Voiding ok.   Temp to 101.1 after discharge from the hospital.   She is not taking pain medication.  She ran out of Dilaudid.   Has used some ice on this incision, which is not helpful.  GYNECOLOGIC HISTORY: Patient's last menstrual period was 03/20/2019 (approximate). Contraception: Tuba/Hyst Menopausal hormone therapy:  none Last mammogram:  04-01-20 3D/Lt.Br.poss.mass;Rt.Br.Neg--Lt.Br.Diag was Neg/density B/BiRads1 Last pap smear: 10-15-20 ASCUS:Pos HR HPV                              04/22/20 LGSIL:Pos HR HPV                             10-22-19 ASCUS:Pos HRHPV                               05-02-19 Neg:Neg HR HPV                                09-25-18 Neg:Neg HR HPV        OB History    Gravida  1   Para  1   Term      Preterm      AB      Living  1     SAB      TAB      Ectopic      Multiple  1   Live Births                 Patient Active Problem List   Diagnosis Date Noted  . Status post laparoscopic hysterectomy 10/26/2020  . Vitamin D deficiency 05/23/2017  . Anxiety 06/19/2015  . Anemia, iron deficiency 06/19/2015  . Pernicious anemia 06/19/2015  .  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 Protein-calorie malnutrition, severe ([place]) 04/18/2015  . Severe protein-calorie malnutrition ([place]) 04/18/2015  . 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 Bariatric surgery status 04/18/2015  . 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 GERD (gastroesophageal reflux disease) 04/17/2015  . Hypokalemia 04/16/2015  . Migraine with aura 05/21/2014  . 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 Abnormal Pap smear   . Anemia   . Anxiety   . Arthritis    neck  . Broken toe 12-22-15   middle toe right foot  . Cataract of both eyes   . Depression   . GERD (gastroesophageal reflux disease)   .  Headache(784.0)   . Heart murmur   . History of hiatal hernia 2021   small  . History of kidney stones   . Hypothyroidism   . 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 Migraine   . Perforated bowel ([place])   . Pernicious anemia 06/19/2015  . PONV (postoperative nausea and vomiting)   . Thyroid disease    hypothyrodism    Past Surgical History:  Procedure Laterality Date  . CATARACT EXTRACTION, BILATERAL Bilateral 02/2016  . CERVICAL BIOPSY  W/ LOOP ELECTRODE EXCISION  11/2013, 05/2018, 11/2019   LGSIL, negative for dysplasia, LGSIL and positive ECC with potential LGSIL  - respectively  . 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 CESAREAN SECTION  2008  . CHOLECYSTECTOMY    . COLONOSCOPY  10/2020  . CYSTOSCOPY N/A 10/26/2020   Procedure: CYSTOSCOPY;  Surgeon: Nunzio Cobbs, MD;  Location: [facility];  Service: Gynecology;  Laterality: N/A;  . GASTRIC BYPASS  2002  . HYSTEROSCOPY WITH D & C  10/14/2011   Procedure: DILATATION AND CURETTAGE (D&C) /HYSTEROSCOPY;  Surgeon: Arloa Koh;  Location: [facility];  Service: Gynecology;  Laterality: Bilateral;  . LASIK    . NOSE  SURGERY    . STOMACH SURGERY  04/20/2015   dilation of "connection between stomach and intestine", [facility]  . TONSILLECTOMY    . TONSILLECTOMY    . TOTAL LAPAROSCOPIC HYSTERECTOMY WITH BILATERAL SALPINGO OOPHORECTOMY N/A 10/26/2020   Procedure: TOTAL LAPAROSCOPIC HYSTERECTOMY WITH BILATERAL SALPINGECTOMY;  Surgeon: Nunzio Cobbs, MD;  Location: [facility];  Service: Gynecology;  Laterality: N/A;  . TUBAL LIGATION  2008    Current Outpatient Medications  Medication Sig Dispense Refill  . AIMOVIG 140 MG/ML SOAJ Inject 140 mg as directed every 30 (thirty) days.     . ALPRAZolam (XANAX) 1 MG tablet Take 1 mg by mouth 2 (two) times daily as needed. Anxiety      . Armodafinil 250 MG tablet Take 250 mg by mouth every morning.  2  . baclofen (LIORESAL) 10 MG tablet Take 10 mg by mouth 2 (two) times daily as needed for muscle spasms.     . botulinum toxin Type A (BOTOX) 100 units SOLR injection once. Unaware of dose, pt gets q 45mo for migraines    . Botulinum Toxin Type A, Cosm, 100 units SOLR once. Unaware of dose, pt gets q 4mo for migraines    . CONTRAVE 8-90 MG TB12 Take 2 tablets by mouth 2 (two) times daily.    . cyanocobalamin (,VITAMIN B-12,) 1000 MCG/ML injection Inject 1,000 mcg into the muscle every 21 ( twenty-one) days.     Marland Kitchen DEXILANT 60 MG capsule Take 60 mg by mouth daily.    . diphenhydrAMINE (BENADRYL) 12.5 MG/5ML elixir Take 12.5 mg by mouth every 6 (six) hours as needed for allergies.     Marland Kitchen divalproex (DEPAKOTE) 500 MG DR tablet Take 1 tablet (500 mg total) by mouth 2 (two) times daily. 180 tablet 4  . doxepin (SINEQUAN) 75 MG capsule Take 75 mg by mouth at bedtime.     Marland Kitchen doxycycline (ADOXA) 100 MG tablet Take 1 tablet (100 mg total) by mouth 2 (two) times daily. 14 tablet 0  . eletriptan (RELPAX) 40 MG tablet Take 1 tablet (40 mg total) by mouth as needed for migraine or headache. May repeat in 2 hours if needed. Max 2 tabs per  day or 8 tabs per month.  10 tablet 12  . ergocalciferol (VITAMIN D2) 50000 UNITS capsule Take 50,000 Units by mouth See admin instructions. She is taking daily M-F - pr pt    . Eszopiclone 3 MG TABS Take 3 mg by mouth at bedtime.   1  . fexofenadine (ALLEGRA) 180 MG tablet Take 180 mg by mouth daily as needed. allergies     . fluticasone (FLONASE) 50 MCG/ACT nasal spray Place 1 spray into both nostrils as needed for allergies.   1  . gabapentin (NEURONTIN) 300 MG capsule Take 300-900 mg by mouth See admin instructions. Taking 1 (300mg ) at night for 1 week, then 2 capsules (600mg ) for 2nd week, then 3 capsules (900mg ) at night until seen    . hydrOXYzine (ATARAX/VISTARIL) 25 MG tablet Take 25 mg by mouth every 8 (eight) hours as needed (headache).   2  . imipramine (TOFRANIL) 50 MG tablet Take 100 mg by mouth daily.   0  . lansoprazole (PREVACID) 30 MG capsule Take 30 mg by mouth daily as needed (acid reflux). Acid reflux      . Lasmiditan Succinate 100 MG TABS Take 100 mg by mouth as needed (headache onset).     Marland Kitchen levothyroxine (SYNTHROID) 50 MCG tablet TAKE 1 TABLET BY MOUTH EVERY DAY 30 MINUTES BEFORE EATING (Patient taking differently: Take 50 mcg by mouth daily before breakfast. TAKE 1 TABLET BY MOUTH EVERY DAY 30 MINUTES BEFORE EATING) 90 tablet 3  . LINZESS 145 MCG CAPS capsule Take 145 mcg by mouth daily.  3  . Multiple Vitamins-Minerals (MULTIVITAMIN WITH MINERALS) tablet Take 1 tablet by mouth daily.      Marland Kitchen MYRBETRIQ 25 MG TB24 tablet TAKE 1 TABLET(25 MG) BY MOUTH DAILY (Patient taking differently: Take 25 mg by mouth daily. ) 30 tablet 6  . nystatin (MYCOSTATIN/NYSTOP) powder Apply 1 application topically daily as needed (yeast infection).     . nystatin-triamcinolone (MYCOLOG II) cream Apply 1 application topically 2 (two) times daily. Apply to affected area BID for up to 7 days. (Patient taking differently: Apply 1 application topically 2 (two) times daily as needed (rash). ) 60 g 0  . ondansetron (ZOFRAN) 4 MG  tablet Take 4 mg by mouth every 8 (eight) hours as needed for nausea or vomiting.     Marland Kitchen OZEMPIC, 0.25 OR 0.5 MG/DOSE, 2 MG/1.5ML SOPN Inject 0.25 mg into the skin once a week. Sunday    . promethazine (PHENERGAN) 12.5 MG tablet TAKE 1 TABLET(12.5 MG) BY MOUTH EVERY 8 HOURS AS NEEDED FOR NAUSEA OR VOMITING (Patient taking differently: Take 12.5 mg by mouth every 8 (eight) hours as needed for nausea or vomiting. ) 15 tablet 0  . promethazine (PROMETHEGAN) 25 MG suppository Place 25 mg rectally every 6 (six) hours as needed for nausea or vomiting.     . thiamine (VITAMIN B-1) 50 MG tablet Take 50 mg by mouth daily.   0  . tiZANidine (ZANAFLEX) 4 MG tablet Take 2-4 mg by mouth at bedtime.   0  . UBRELVY 50 MG TABS Take 50 mg by mouth daily as needed (migraine).     . venlafaxine XR (EFFEXOR-XR) 150 MG 24 hr capsule Take 300 mg by mouth every morning.    . WELLBUTRIN XL 300 MG 24 hr tablet Take 300 mg by mouth daily.   1  . zonisamide (ZONEGRAN) 50 MG capsule Take 50 mg by mouth at bedtime.    Marland Kitchen omeprazole (PRILOSEC) 20  MG capsule Take 20 mg by mouth daily as needed (heartburn).      No current facility-administered medications for this visit.     ALLERGIES: Naproxen, Monistat [miconazole], Sulfa antibiotics, and Tioconazole  Family History  Problem Relation Age of Onset  . Diabetes Mother   . Heart disease Father   . Social phobia Father   . Psychiatric Illness Father   . Hypertension Father   . Stroke Father   . Dementia Father   . Heart disease Other   . Diabetes Other   . Stroke Other   . Hypertension Maternal Grandmother   . Thyroid disease Maternal Grandmother   . Hypertension Paternal Grandmother   . Stroke Paternal Grandfather   . Breast cancer Neg Hx     Social History   Socioeconomic History  . Marital status: Divorced    Spouse name: Not on file  . Number of children: 1  . Years of education: College  . Highest education level: Not on file  Occupational History     Employer: [employer]  Tobacco Use  . Smoking status: Never Smoker  . Smokeless tobacco: Never Used  Vaping Use  . Vaping Use: Never used  Substance and Sexual Activity  . Alcohol use: Yes    Alcohol/week: 0.0 standard drinks    Comment: Once a month-rarely  . Drug use: No  . Sexual activity: Not Currently    Partners: Male    Birth control/protection: Surgical    Comment: tubal  Other Topics Concern  . Not on file  Social History Narrative   Pt lives at home with her family.   Caffeine Use: once daily   Social Determinants of Health   Financial Resource Strain:   . Difficulty of Paying Living Expenses: Not on file  Food Insecurity:   . Worried About Charity fundraiser in the Last Year: Not on file  . Ran Out of Food in the Last Year: Not on file  Transportation Needs:   . Lack of Transportation (Medical): Not on file  . Lack of Transportation (Non-Medical): Not on file  Physical Activity:   . Days of Exercise per Week: Not on file  . Minutes of Exercise per Session: Not on file  Stress:   . Feeling of Stress : Not on file  Social Connections:   . Frequency of Communication with Friends and Family: Not on file  . Frequency of Social Gatherings with Friends and Family: Not on file  . Attends Religious Services: Not on file  . Active Member of Clubs or Organizations: Not on file  . Attends Archivist Meetings: Not on file  . Marital Status: Not on file  Intimate Partner Violence:   . Fear of Current or Ex-Partner: Not on file  . Emotionally Abused: Not on file  . Physically Abused: Not on file  . Sexually Abused: Not on file    Review of Systems  All other systems reviewed and are negative.   PHYSICAL EXAMINATION:    BP 120/84 (Cuff Size: Large)   Pulse 92   Ht 5\' 3"  (1.6 m)   Wt 198 lb (89.8 kg)   LMP 03/20/2019 (Approximate)   SpO2 99%   BMI 35.07 kg/m     General appearance: alert, cooperative and appears stated age   Lungs: clear to  auscultation bilaterally Heart: regular rate and rhythm Abdomen: soft, 5 cm incisional hematoma (mass) of the right incision.  Ecchymoses noted.  Left inferior incision with  minor ecchymoses and slight induration.              ASSESSMENT  Status post laparoscopic hysterectomy with bilateral salpingectomy, cystoscopy.  Incisional hematoma.   Allergy to NSAID.   PLAN  Rx for Vicodin Elixer.  Ultrasound of abdominal wall.  No driving at this time.  FU in 1 week.

## 2020-11-05 ENCOUNTER — Ambulatory Visit (INDEPENDENT_AMBULATORY_CARE_PROVIDER_SITE_OTHER): Payer: 59 | Admitting: Obstetrics and Gynecology

## 2020-11-05 ENCOUNTER — Encounter: Payer: Self-pay | Admitting: Obstetrics and Gynecology

## 2020-11-05 ENCOUNTER — Ambulatory Visit: Payer: 59 | Admitting: Obstetrics and Gynecology

## 2020-11-05 ENCOUNTER — Other Ambulatory Visit: Payer: Self-pay

## 2020-11-05 VITALS — BP 120/84 | HR 92 | Ht 63.0 in | Wt 198.0 lb

## 2020-11-05 DIAGNOSIS — T148XXA Other injury of unspecified body region, initial encounter: Secondary | ICD-10-CM

## 2020-11-05 DIAGNOSIS — R109 Unspecified abdominal pain: Secondary | ICD-10-CM

## 2020-11-05 DIAGNOSIS — Z90710 Acquired absence of both cervix and uterus: Secondary | ICD-10-CM

## 2020-11-05 MED ORDER — HYDROCODONE-ACETAMINOPHEN 7.5-325 MG/15ML PO SOLN
15.0000 mL | Freq: Four times a day (QID) | ORAL | 0 refills | Status: DC | PRN
Start: 2020-11-05 — End: 2020-11-13

## 2020-11-05 NOTE — Progress Notes (Signed)
Patient scheduled while in office. Spoke with Elberta Fortis at [facility] Radiology scheduling. Scheduled for STAT abdominal US complete at [facility] on 11/06/20 AT 0730, arrive at [facility]. Nothing to eat or drink after midnight tonight. ER precautions reviewed with patient. Patient verbalizes understanding and is agreeable.

## 2020-11-05 NOTE — Patient Instructions (Signed)

## 2020-11-06 ENCOUNTER — Other Ambulatory Visit: Payer: Self-pay | Admitting: Obstetrics and Gynecology

## 2020-11-06 ENCOUNTER — Telehealth: Payer: Self-pay

## 2020-11-06 ENCOUNTER — Ambulatory Visit (HOSPITAL_COMMUNITY)
Admission: RE | Admit: 2020-11-06 | Discharge: 2020-11-06 | Disposition: A | Discharge status: Home / Self Care | Payer: 59 | Source: Ambulatory Visit | Attending: Obstetrics and Gynecology | Admitting: Obstetrics and Gynecology

## 2020-11-06 DIAGNOSIS — R109 Unspecified abdominal pain: Secondary | ICD-10-CM

## 2020-11-06 NOTE — Telephone Encounter (Signed)
Patient calling to speak with [facility] regarding FMLA  paperwork.

## 2020-11-09 NOTE — Telephone Encounter (Addendum)
Spoke with patient. Patient reports "she just does not feel good". Lives alone with her 52 year old son. Has been trying to keep up with cleaning at home. Patient reports for the past 6 years her mother has been "putting her down" emotionally. States her mother has been telling her she needs to do more during her post-op recovery time. Patient states she has seen a therapist in the past, declines to return due to finances. Offered to review options for counselor or therapist, patient again declined.   Patient reports constant pain at left and right incisions. Incisions are closed, no drainage. Denies vaginal bleeding, d/c or odor. Pain currently 8/10. She has tried alternating ice and heat along with OTC Tylenol, not effective. She reports she is only taking Vicodin pain medication at night before bed, is unable to take during the day and preform ADLs.   Patient reports she is taking metamucil daily, eating and hydrating well. Reports one episode of vomiting x1 and temp of 99.9 on 12/5, symptoms have resolved. Has not checked her temp today, "does not feel warm". Advised patient to check temp, return call to office to notify if elevated.   Confirmed patient is not currently taking Contrave.   Patient reports she is taking Myrbetriq 25 mg po daily. Reports increase in urinary incontinence and unable to void at times. Reports she is hydrating well. Denies dysuria, hematuria, flank pain. Advised OV needed for further evaluation, patient declines due to lack of transportation. Has an OV scheduled for 12/9 at 4:30 pm. Strongly encouraged earlier OV, patient again declined. Patient states she will contact the office if any new symptoms develop or symptoms worsen. Advised patient I will update Dr. Quincy Simmonds and return call if any additional recommendations. ER precautions reviewed for new or worsening symptoms.  Reviewed recommendations as seen below per Dr. Quincy Simmonds.  Patient thankful for call.   Length of call 30  min.   Routing to Dr. Quincy Simmonds.

## 2020-11-09 NOTE — Telephone Encounter (Signed)
Please let patient know that she needs to decrease her activity.  She should not be doing outside chores or heavy house work.  She should not lift over pounds.   I expect some soreness still.   She can use ice or heat for the hematoma.   She can start transitioning to Tylenol extra strength.   I will see her on 11/12/20 for her next office visit.

## 2020-11-09 NOTE — Telephone Encounter (Signed)
Returned call to patient. Patient's questions answered regarding FMLA paperwork.  Patient got upset stating that she was told she could not have any more pain medication. Patient stated that she is still having excruciating pain. Patient stated that pain is 8 out of 10. Patient stated that she is currently not taking anything for pain, and has about half of the bottle of pain medication left. Patient stated that she washed dishes for 2 hours yesterday, swept leaves off her deck and cleaned her house.  Patient stated that her mother is telling her, "You need to be a strong woman and be up doing things around the house. You don't need to be taking any pain medication or you'll become an addict." Patient stated, "I know no one cares, but I can't catch a break from anywhere."  Informed patient that I would follow up with Dr. Quincy Simmonds and her nurse, as well as [place], and we would return her call. Informed patient to call us back if she has any changes in the meantime. Patient stated, "I'm not going to call back. There's no point."  Routing to Dr. Quincy Simmonds and triage to assist patient

## 2020-11-10 NOTE — Telephone Encounter (Signed)
Encounter reviewed and closed.  I agree with your recommendations.  

## 2020-11-12 ENCOUNTER — Ambulatory Visit: Payer: 59 | Admitting: Obstetrics and Gynecology

## 2020-11-12 ENCOUNTER — Telehealth: Payer: Self-pay

## 2020-11-12 NOTE — Telephone Encounter (Signed)
Patient cancelled appointment today because of emergency. Son passed out at school and she will call back to reschedule.

## 2020-11-12 NOTE — Progress Notes (Deleted)
GYNECOLOGY  VISIT   HPI: 54 y.o.   Divorced  Caucasian  female   G1P1 with Patient's last menstrual period was 03/20/2019 (approximate).   here for 1 week f/u.     GYNECOLOGIC HISTORY: Patient's last menstrual period was 03/20/2019 (approximate). Contraception:  Tubal/Hysterectomy Menopausal hormone therapy:  none Last mammogram:  04/01/20 3D/Lt.Br.poss.mass;Rt.Br.Neg--Lt.Br.Diag was Neg/density B/BiRads1 Last pap smear:   10-15-20 ASCUS:Pos HR HPV                              04/22/20 LGSIL:Pos HR HPV                             10-22-19 ASCUS:Pos HRHPV                               05-02-19 Neg:Neg HR HPV                                09-25-18 Neg:Neg HR HPV        OB History    Gravida  1   Para  1   Term      Preterm      AB      Living  1     SAB      IAB      Ectopic      Multiple  1   Live Births                 Patient Active Problem List   Diagnosis Date Noted  . Status post laparoscopic hysterectomy 10/26/2020  . Vitamin D deficiency 05/23/2017  . Anxiety 06/19/2015  . Anemia, iron deficiency 06/19/2015  . Pernicious anemia 06/19/2015  . 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 Protein-calorie malnutrition, severe ([place]) 04/18/2015  . Severe protein-calorie malnutrition ([place]) 04/18/2015  . 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 Bariatric surgery status 04/18/2015  . 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 GERD (gastroesophageal reflux disease) 04/17/2015  . Hypokalemia 04/16/2015  . Migraine with aura 05/21/2014  . 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 Abnormal Pap smear   . Anemia   . Anxiety   . Arthritis    neck  . Broken toe 12-22-15   middle toe right foot  . Cataract of both eyes   . Depression   . GERD (gastroesophageal reflux disease)   . Headache(784.0)   . Heart murmur   . History of hiatal hernia 2021    small  . History of kidney stones   . Hypothyroidism   . 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 Migraine   . Perforated bowel ([place])   . Pernicious anemia 06/19/2015  . PONV (postoperative nausea and vomiting)   . Thyroid disease    hypothyrodism    Past Surgical History:  Procedure Laterality Date  . CATARACT EXTRACTION, BILATERAL Bilateral 02/2016  . CERVICAL BIOPSY  W/ LOOP ELECTRODE EXCISION  11/2013, 05/2018, 11/2019   LGSIL, negative for dysplasia, LGSIL and positive ECC with potential LGSIL  - respectively  . 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 CESAREAN SECTION  2008  . CHOLECYSTECTOMY    . COLONOSCOPY  10/2020  .  CYSTOSCOPY N/A 10/26/2020   Procedure: CYSTOSCOPY;  Surgeon: Nunzio Cobbs, MD;  Location: [facility];  Service: Gynecology;  Laterality: N/A;  . GASTRIC BYPASS  2002  . HYSTEROSCOPY WITH D & C  10/14/2011   Procedure: DILATATION AND CURETTAGE (D&C) /HYSTEROSCOPY;  Surgeon: Arloa Koh;  Location: [facility];  Service: Gynecology;  Laterality: Bilateral;  . LASIK    . NOSE SURGERY    . STOMACH SURGERY  04/20/2015   dilation of "connection between stomach and intestine", [facility]  . TONSILLECTOMY    . TONSILLECTOMY    . TOTAL LAPAROSCOPIC HYSTERECTOMY WITH BILATERAL SALPINGO OOPHORECTOMY N/A 10/26/2020   Procedure: TOTAL LAPAROSCOPIC HYSTERECTOMY WITH BILATERAL SALPINGECTOMY;  Surgeon: Nunzio Cobbs, MD;  Location: [facility];  Service: Gynecology;  Laterality: N/A;  . TUBAL LIGATION  2008    Current Outpatient Medications  Medication Sig Dispense Refill  . AIMOVIG 140 MG/ML SOAJ Inject 140 mg as directed every 30 (thirty) days.     . ALPRAZolam (XANAX) 1 MG tablet Take 1 mg by mouth 2 (two) times daily as needed. Anxiety      . Armodafinil 250 MG tablet Take 250 mg by mouth every morning.  2  . baclofen (LIORESAL) 10 MG tablet Take 10 mg by mouth 2  (two) times daily as needed for muscle spasms.     . botulinum toxin Type A (BOTOX) 100 units SOLR injection once. Unaware of dose, pt gets q 31mo for migraines    . Botulinum Toxin Type A, Cosm, 100 units SOLR once. Unaware of dose, pt gets q 35mo for migraines    . CONTRAVE 8-90 MG TB12 Take 2 tablets by mouth 2 (two) times daily.    . cyanocobalamin (,VITAMIN B-12,) 1000 MCG/ML injection Inject 1,000 mcg into the muscle every 21 ( twenty-one) days.     Marland Kitchen DEXILANT 60 MG capsule Take 60 mg by mouth daily.    . diphenhydrAMINE (BENADRYL) 12.5 MG/5ML elixir Take 12.5 mg by mouth every 6 (six) hours as needed for allergies.     Marland Kitchen divalproex (DEPAKOTE) 500 MG DR tablet Take 1 tablet (500 mg total) by mouth 2 (two) times daily. 180 tablet 4  . doxepin (SINEQUAN) 75 MG capsule Take 75 mg by mouth at bedtime.     Marland Kitchen doxycycline (ADOXA) 100 MG tablet Take 1 tablet (100 mg total) by mouth 2 (two) times daily. 14 tablet 0  . eletriptan (RELPAX) 40 MG tablet Take 1 tablet (40 mg total) by mouth as needed for migraine or headache. May repeat in 2 hours if needed. Max 2 tabs per day or 8 tabs per month. 10 tablet 12  . ergocalciferol (VITAMIN D2) 50000 UNITS capsule Take 50,000 Units by mouth See admin instructions. She is taking daily M-F - pr pt    . Eszopiclone 3 MG TABS Take 3 mg by mouth at bedtime.   1  . fexofenadine (ALLEGRA) 180 MG tablet Take 180 mg by mouth daily as needed. allergies     . fluticasone (FLONASE) 50 MCG/ACT nasal spray Place 1 spray into both nostrils as needed for allergies.   1  . gabapentin (NEURONTIN) 300 MG capsule Take 300-900 mg by mouth See admin instructions. Taking 1 (300mg ) at night for 1 week, then 2 capsules (600mg ) for 2nd week, then 3 capsules (900mg ) at night until seen    . HYDROcodone-acetaminophen (HYCET) 7.5-325 mg/15 ml solution Take 15 mLs by mouth  4 (four) times daily as needed for moderate pain. 120 mL 0  . hydrOXYzine (ATARAX/VISTARIL) 25 MG tablet Take 25 mg by  mouth every 8 (eight) hours as needed (headache).   2  . imipramine (TOFRANIL) 50 MG tablet Take 100 mg by mouth daily.   0  . lansoprazole (PREVACID) 30 MG capsule Take 30 mg by mouth daily as needed (acid reflux). Acid reflux      . Lasmiditan Succinate 100 MG TABS Take 100 mg by mouth as needed (headache onset).     Marland Kitchen levothyroxine (SYNTHROID) 50 MCG tablet TAKE 1 TABLET BY MOUTH EVERY DAY 30 MINUTES BEFORE EATING (Patient taking differently: Take 50 mcg by mouth daily before breakfast. TAKE 1 TABLET BY MOUTH EVERY DAY 30 MINUTES BEFORE EATING) 90 tablet 3  . LINZESS 145 MCG CAPS capsule Take 145 mcg by mouth daily.  3  . Multiple Vitamins-Minerals (MULTIVITAMIN WITH MINERALS) tablet Take 1 tablet by mouth daily.      Marland Kitchen MYRBETRIQ 25 MG TB24 tablet TAKE 1 TABLET(25 MG) BY MOUTH DAILY (Patient taking differently: Take 25 mg by mouth daily. ) 30 tablet 6  . nystatin (MYCOSTATIN/NYSTOP) powder Apply 1 application topically daily as needed (yeast infection).     . nystatin-triamcinolone (MYCOLOG II) cream Apply 1 application topically 2 (two) times daily. Apply to affected area BID for up to 7 days. (Patient taking differently: Apply 1 application topically 2 (two) times daily as needed (rash). ) 60 g 0  . omeprazole (PRILOSEC) 20 MG capsule Take 20 mg by mouth daily as needed (heartburn).     . ondansetron (ZOFRAN) 4 MG tablet Take 4 mg by mouth every 8 (eight) hours as needed for nausea or vomiting.     Marland Kitchen OZEMPIC, 0.25 OR 0.5 MG/DOSE, 2 MG/1.5ML SOPN Inject 0.25 mg into the skin once a week. Sunday    . promethazine (PHENERGAN) 12.5 MG tablet TAKE 1 TABLET(12.5 MG) BY MOUTH EVERY 8 HOURS AS NEEDED FOR NAUSEA OR VOMITING (Patient taking differently: Take 12.5 mg by mouth every 8 (eight) hours as needed for nausea or vomiting. ) 15 tablet 0  . promethazine (PROMETHEGAN) 25 MG suppository Place 25 mg rectally every 6 (six) hours as needed for nausea or vomiting.     . thiamine (VITAMIN B-1) 50 MG tablet  Take 50 mg by mouth daily.   0  . tiZANidine (ZANAFLEX) 4 MG tablet Take 2-4 mg by mouth at bedtime.   0  . UBRELVY 50 MG TABS Take 50 mg by mouth daily as needed (migraine).     . venlafaxine XR (EFFEXOR-XR) 150 MG 24 hr capsule Take 300 mg by mouth every morning.    . WELLBUTRIN XL 300 MG 24 hr tablet Take 300 mg by mouth daily.   1  . zonisamide (ZONEGRAN) 50 MG capsule Take 50 mg by mouth at bedtime.     No current facility-administered medications for this visit.     ALLERGIES: Naproxen, Monistat [miconazole], Sulfa antibiotics, and Tioconazole  Family History  Problem Relation Age of Onset  . Diabetes Mother   . Heart disease Father   . Social phobia Father   . Psychiatric Illness Father   . Hypertension Father   . Stroke Father   . Dementia Father   . Heart disease Other   . Diabetes Other   . Stroke Other   . Hypertension Maternal Grandmother   . Thyroid disease Maternal Grandmother   . Hypertension Paternal Grandmother   .  Stroke Paternal Grandfather   . Breast cancer Neg Hx     Social History   Socioeconomic History  . Marital status: Divorced    Spouse name: Not on file  . Number of children: 1  . Years of education: College  . Highest education level: Not on file  Occupational History    Employer: [employer]  Tobacco Use  . Smoking status: Never Smoker  . Smokeless tobacco: Never Used  Vaping Use  . Vaping Use: Never used  Substance and Sexual Activity  . Alcohol use: Yes    Alcohol/week: 0.0 standard drinks    Comment: Once a month-rarely  . Drug use: No  . Sexual activity: Not Currently    Partners: Male    Birth control/protection: Surgical    Comment: tubal  Other Topics Concern  . Not on file  Social History Narrative   Pt lives at home with her family.   Caffeine Use: once daily   Social Determinants of Health   Financial Resource Strain: Not on file  Food Insecurity: Not on file  Transportation Needs: Not on file  Physical  Activity: Not on file  Stress: Not on file  Social Connections: Not on file  Intimate Partner Violence: Not on file    Review of Systems  PHYSICAL EXAMINATION:    LMP 03/20/2019 (Approximate)     General appearance: alert, cooperative and appears stated age Head: Normocephalic, without obvious abnormality, atraumatic Neck: no adenopathy, supple, symmetrical, trachea midline and thyroid normal to inspection and palpation Lungs: clear to auscultation bilaterally Breasts: normal appearance, no masses or tenderness, No nipple retraction or dimpling, No nipple discharge or bleeding, No axillary or supraclavicular adenopathy Heart: regular rate and rhythm Abdomen: soft, non-tender, no masses,  no organomegaly Extremities: extremities normal, atraumatic, no cyanosis or edema Skin: Skin color, texture, turgor normal. No rashes or lesions Lymph nodes: Cervical, supraclavicular, and axillary nodes normal. No abnormal inguinal nodes palpated Neurologic: Grossly normal  Pelvic: External genitalia:  no lesions              Urethra:  normal appearing urethra with no masses, tenderness or lesions              Bartholins and Skenes: normal                 Vagina: normal appearing vagina with normal color and discharge, no lesions              Cervix: no lesions                Bimanual Exam:  Uterus:  normal size, contour, position, consistency, mobility, non-tender              Adnexa: no mass, fullness, tenderness              Rectal exam: {yes no:[id number]}.  Confirms.              Anus:  normal sphincter tone, no lesions  Chaperone was present for exam.  ASSESSMENT     PLAN     An After Visit Summary was printed and given to the patient.  ______ minutes face to face time of which over 50% was spent in counseling.

## 2020-11-12 NOTE — Telephone Encounter (Signed)
Thank you for the update.  I hope her son is OK.

## 2020-11-13 ENCOUNTER — Ambulatory Visit: Payer: 59 | Admitting: Obstetrics and Gynecology

## 2020-11-13 ENCOUNTER — Other Ambulatory Visit: Payer: Self-pay

## 2020-11-13 ENCOUNTER — Telehealth: Payer: Self-pay

## 2020-11-13 ENCOUNTER — Encounter: Payer: Self-pay | Admitting: Obstetrics and Gynecology

## 2020-11-13 VITALS — BP 114/70 | HR 72 | Ht 63.0 in | Wt 198.0 lb

## 2020-11-13 DIAGNOSIS — Z9889 Other specified postprocedural states: Secondary | ICD-10-CM

## 2020-11-13 DIAGNOSIS — F32A Depression, unspecified: Secondary | ICD-10-CM | POA: Diagnosis not present

## 2020-11-13 DIAGNOSIS — R3915 Urgency of urination: Secondary | ICD-10-CM | POA: Diagnosis not present

## 2020-11-13 LAB — POCT URINALYSIS DIPSTICK
Bilirubin, UA: NEGATIVE
Glucose, UA: NEGATIVE
Ketones, UA: NEGATIVE
Nitrite, UA: NEGATIVE
Protein, UA: NEGATIVE
Urobilinogen, UA: 0.2 E.U./dL
pH, UA: 5 (ref 5.0–8.0)

## 2020-11-13 NOTE — Progress Notes (Signed)
GYNECOLOGY  VISIT   HPI: 54 y.o.   Divorced  Caucasian  female   G1P1 with Patient's last menstrual period was 03/20/2019 (approximate).   here for post op. Patient c/o having urinary urgency and feeling like she is not emptying completely.  Taking Myrbetriq 25 mg daily.  No pain with urination.    TOTAL LAPAROSCOPIC HYSTERECTOMY WITH BILATERAL SALPINGECTOMY (N/A Abdomen) CYSTOSCOPY (N/A Bladder). Final pathology: LGSIL of cervix, inactive endometrium, benign fallopian tubes.   Patient had an abdominal wall ultrasound to evaluate post op abdominal wall masses which were evaluated as hematomas prior to the imaging.   She has had ecchymoses of the abdominal wall.  The imaging confirmed fluid in the subcutaneous tissue, consistent with hematomas.  She has not had pus like drainage, fever, or increased redness or heat of the skin.  Son passed out at school and patient had to take him to the ER.  He will see neurology.  Patient having some depression and wonders if it is due to menopause. On Wellbutrin.   No hot flashes or night sweats.   Has stress with her mother.  Her aunt is supportive of her.   Urine dip - 2+ RBCs, trace WBCs.   GYNECOLOGIC HISTORY: Patient's last menstrual period was 03/20/2019 (approximate). Contraception:  Tubal and Hysterectomy Menopausal hormone therapy:  none Last mammogram:  04/01/20 3D/Lt.Br.poss.mass;Rt.Br.Neg--Lt.Br.Diag was Neg/density B/Birads1 Last pap smear:   10-15-20 ASCUS:Pos HR HPV                              04/22/20 LGSIL:Pos HR HPV                             10-22-19 ASCUS:Pos HRHPV                               05-02-19 Neg:Neg HR HPV                                09-25-18 Neg:Neg HR HPV        OB History    Gravida  1   Para  1   Term      Preterm      AB      Living  1     SAB      IAB      Ectopic      Multiple  1   Live Births                 Patient Active Problem List   Diagnosis Date Noted  .  Status post laparoscopic hysterectomy 10/26/2020  . Vitamin D deficiency 05/23/2017  . Anxiety 06/19/2015  . Anemia, iron deficiency 06/19/2015  . Pernicious anemia 06/19/2015  . 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 Protein-calorie malnutrition, severe ([place]) 04/18/2015  . Severe protein-calorie malnutrition ([place]) 04/18/2015  . 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 Bariatric surgery status 04/18/2015  . 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 GERD (gastroesophageal reflux disease) 04/17/2015  . Hypokalemia 04/16/2015  . Migraine with aura 05/21/2014  . 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 Abnormal Pap smear   . Anemia   . Anxiety   . Arthritis  neck  . Broken toe 12-22-15   middle toe right foot  . Cataract of both eyes   . Depression   . GERD (gastroesophageal reflux disease)   . Headache(784.0)   . Heart murmur   . History of hiatal hernia 2021   small  . History of kidney stones   . Hypothyroidism   . 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 Migraine   . Perforated bowel ([place])   . Pernicious anemia 06/19/2015  . PONV (postoperative nausea and vomiting)   . Thyroid disease    hypothyrodism    Past Surgical History:  Procedure Laterality Date  . CATARACT EXTRACTION, BILATERAL Bilateral 02/2016  . CERVICAL BIOPSY  W/ LOOP ELECTRODE EXCISION  11/2013, 05/2018, 11/2019   LGSIL, negative for dysplasia, LGSIL and positive ECC with potential LGSIL  - respectively  . 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 CESAREAN SECTION  2008  . CHOLECYSTECTOMY    . COLONOSCOPY  10/2020  . CYSTOSCOPY N/A 10/26/2020   Procedure: CYSTOSCOPY;  Surgeon: Nunzio Cobbs, MD;  Location: [facility];  Service: Gynecology;  Laterality: N/A;  . GASTRIC BYPASS  2002  . HYSTEROSCOPY WITH D & C   10/14/2011   Procedure: DILATATION AND CURETTAGE (D&C) /HYSTEROSCOPY;  Surgeon: Arloa Koh;  Location: [facility];  Service: Gynecology;  Laterality: Bilateral;  . LASIK    . NOSE SURGERY    . STOMACH SURGERY  04/20/2015   dilation of "connection between stomach and intestine", [facility]  . TONSILLECTOMY    . TONSILLECTOMY    . TOTAL LAPAROSCOPIC HYSTERECTOMY WITH BILATERAL SALPINGO OOPHORECTOMY N/A 10/26/2020   Procedure: TOTAL LAPAROSCOPIC HYSTERECTOMY WITH BILATERAL SALPINGECTOMY;  Surgeon: Nunzio Cobbs, MD;  Location: [facility];  Service: Gynecology;  Laterality: N/A;  . TUBAL LIGATION  2008    Current Outpatient Medications  Medication Sig Dispense Refill  . AIMOVIG 140 MG/ML SOAJ Inject 140 mg as directed every 30 (thirty) days.     . ALPRAZolam (XANAX) 1 MG tablet Take 1 mg by mouth 2 (two) times daily as needed. Anxiety    . Armodafinil 250 MG tablet Take 250 mg by mouth every morning.  2  . baclofen (LIORESAL) 10 MG tablet Take 10 mg by mouth 2 (two) times daily as needed for muscle spasms.     . botulinum toxin Type A (BOTOX) 100 units SOLR injection once. Unaware of dose, pt gets q 57mo for migraines    . Botulinum Toxin Type A, Cosm, 100 units SOLR once. Unaware of dose, pt gets q 73mo for migraines    . cyanocobalamin (,VITAMIN B-12,) 1000 MCG/ML injection Inject 1,000 mcg into the muscle every 21 ( twenty-one) days.     Marland Kitchen DEXILANT 60 MG capsule Take 60 mg by mouth daily.    . diphenhydrAMINE (BENADRYL) 12.5 MG/5ML elixir Take 12.5 mg by mouth every 6 (six) hours as needed for allergies.     Marland Kitchen divalproex (DEPAKOTE) 500 MG DR tablet Take 1 tablet (500 mg total) by mouth 2 (two) times daily. 180 tablet 4  . doxepin (SINEQUAN) 75 MG capsule Take 75 mg by mouth at bedtime.     Marland Kitchen doxycycline (ADOXA) 100 MG tablet Take 1 tablet (100 mg total) by mouth 2 (two) times daily. 14 tablet 0  . eletriptan (RELPAX) 40 MG tablet Take 1 tablet (40 mg total) by mouth as  needed for  migraine or headache. May repeat in 2 hours if needed. Max 2 tabs per day or 8 tabs per month. 10 tablet 12  . ergocalciferol (VITAMIN D2) 50000 UNITS capsule Take 50,000 Units by mouth See admin instructions. She is taking daily M-F - pr pt    . Eszopiclone 3 MG TABS Take 3 mg by mouth at bedtime.   1  . fexofenadine (ALLEGRA) 180 MG tablet Take 180 mg by mouth daily as needed. allergies    . fluticasone (FLONASE) 50 MCG/ACT nasal spray Place 1 spray into both nostrils as needed for allergies.   1  . gabapentin (NEURONTIN) 300 MG capsule Take 300-900 mg by mouth See admin instructions. Taking 1 (300mg ) at night for 1 week, then 2 capsules (600mg ) for 2nd week, then 3 capsules (900mg ) at night until seen    . hydrOXYzine (ATARAX/VISTARIL) 25 MG tablet Take 25 mg by mouth every 8 (eight) hours as needed (headache).   2  . imipramine (TOFRANIL) 50 MG tablet Take 100 mg by mouth daily.   0  . lansoprazole (PREVACID) 30 MG capsule Take 30 mg by mouth daily as needed (acid reflux). Acid reflux    . Lasmiditan Succinate 100 MG TABS Take 100 mg by mouth as needed (headache onset).     Marland Kitchen levothyroxine (SYNTHROID) 50 MCG tablet TAKE 1 TABLET BY MOUTH EVERY DAY 30 MINUTES BEFORE EATING (Patient taking differently: Take 50 mcg by mouth daily before breakfast. TAKE 1 TABLET BY MOUTH EVERY DAY 30 MINUTES BEFORE EATING) 90 tablet 3  . LINZESS 145 MCG CAPS capsule Take 145 mcg by mouth daily.  3  . Multiple Vitamins-Minerals (MULTIVITAMIN WITH MINERALS) tablet Take 1 tablet by mouth daily.    Marland Kitchen MYRBETRIQ 25 MG TB24 tablet TAKE 1 TABLET(25 MG) BY MOUTH DAILY (Patient taking differently: Take 25 mg by mouth daily.) 30 tablet 6  . nystatin (MYCOSTATIN/NYSTOP) powder Apply 1 application topically daily as needed (yeast infection).     . nystatin-triamcinolone (MYCOLOG II) cream Apply 1 application topically 2 (two) times daily. Apply to affected area BID for up to 7 days. (Patient taking differently: Apply 1  application topically 2 (two) times daily as needed (rash).) 60 g 0  . omeprazole (PRILOSEC) 20 MG capsule Take 20 mg by mouth daily as needed (heartburn).     . ondansetron (ZOFRAN) 4 MG tablet Take 4 mg by mouth every 8 (eight) hours as needed for nausea or vomiting.     Marland Kitchen OZEMPIC, 0.25 OR 0.5 MG/DOSE, 2 MG/1.5ML SOPN Inject 0.25 mg into the skin once a week. Sunday    . promethazine (PHENERGAN) 12.5 MG tablet TAKE 1 TABLET(12.5 MG) BY MOUTH EVERY 8 HOURS AS NEEDED FOR NAUSEA OR VOMITING (Patient taking differently: Take 12.5 mg by mouth every 8 (eight) hours as needed for nausea or vomiting.) 15 tablet 0  . promethazine (PHENERGAN) 25 MG suppository Place 25 mg rectally every 6 (six) hours as needed for nausea or vomiting.     . thiamine (VITAMIN B-1) 50 MG tablet Take 50 mg by mouth daily.   0  . tiZANidine (ZANAFLEX) 4 MG tablet Take 2-4 mg by mouth at bedtime.   0  . UBRELVY 50 MG TABS Take 50 mg by mouth daily as needed (migraine).     . venlafaxine XR (EFFEXOR-XR) 150 MG 24 hr capsule Take 300 mg by mouth every morning.    . WELLBUTRIN XL 300 MG 24 hr tablet Take 300 mg by mouth daily.  1  . zonisamide (ZONEGRAN) 50 MG capsule Take 50 mg by mouth at bedtime.    . CONTRAVE 8-90 MG TB12 Take 2 tablets by mouth 2 (two) times daily. (Patient not taking: Reported on 11/13/2020)     No current facility-administered medications for this visit.     ALLERGIES: Naproxen, Monistat [miconazole], Sulfa antibiotics, and Tioconazole  Family History  Problem Relation Age of Onset  . Diabetes Mother   . Heart disease Father   . Social phobia Father   . Psychiatric Illness Father   . Hypertension Father   . Stroke Father   . Dementia Father   . Heart disease Other   . Diabetes Other   . Stroke Other   . Hypertension Maternal Grandmother   . Thyroid disease Maternal Grandmother   . Hypertension Paternal Grandmother   . Stroke Paternal Grandfather   . Breast cancer Neg Hx     Social  History   Socioeconomic History  . Marital status: Divorced    Spouse name: Not on file  . Number of children: 1  . Years of education: College  . Highest education level: Not on file  Occupational History    Employer: [employer]  Tobacco Use  . Smoking status: Never Smoker  . Smokeless tobacco: Never Used  Vaping Use  . Vaping Use: Never used  Substance and Sexual Activity  . Alcohol use: Yes    Alcohol/week: 0.0 standard drinks    Comment: Once a month-rarely  . Drug use: No  . Sexual activity: Not Currently    Partners: Male    Birth control/protection: Surgical    Comment: tubal  Other Topics Concern  . Not on file  Social History Narrative   Pt lives at home with her family.   Caffeine Use: once daily   Social Determinants of Health   Financial Resource Strain: Not on file  Food Insecurity: Not on file  Transportation Needs: Not on file  Physical Activity: Not on file  Stress: Not on file  Social Connections: Not on file  Intimate Partner Violence: Not on file    Review of Systems  Constitutional: Negative.   HENT: Negative.   Eyes: Negative.   Respiratory: Negative.   Cardiovascular: Negative.   Gastrointestinal: Negative.   Endocrine: Negative.   Genitourinary: Positive for urgency.  Musculoskeletal: Negative.   Skin: Negative.   Allergic/Immunologic: Negative.   Neurological: Negative.   Hematological: Negative.   Psychiatric/Behavioral: Negative.     PHYSICAL EXAMINATION:    BP 114/70 (BP Location: Right Arm, Patient Position: Sitting, Cuff Size: Large)   Pulse 72   Ht 5\' 3"  (1.6 m)   Wt 198 lb (89.8 kg)   LMP 03/20/2019 (Approximate)   BMI 35.07 kg/m     General appearance: alert, cooperative and appears stated age   Abdomen: ecchymoses and hematomas of the right peri-incisional region.  Hematomas measure in total about 5 cm and 2 cm.  The left lower quadrant incision has a 1 cm subcutaneous firmness consistent with a small  hematoma.  No erythema or calor of the skin.  The abdomen is otherwise soft, non-tender, no masses,  no organomegaly.   Pelvic: External genitalia:  no lesions              Urethra:  normal appearing urethra with no masses, tenderness or lesions  Bimanual Exam:  Uterus:  absent                                            Adnexa: no mass, fullness, tenderness    Sterile I and O cath Verbal permission for procedure. Sterile prep with betadine.          PVR of 13 cc sent for micro and culture.  Urine clear and yellow. Blood on the tip of the catheter with removal.   Chaperone was present for exam.  ASSESSMENT  Status post laparoscopic hysterectomy with bilateral salpingectomy. Abdominal wall hematomas.  No sign of abscess or cellulitis. Urinary urgency.  Overactive bladder.  On Myrbetriq.  No sign of urinary retention.  Depression.  On Wellbutrin.   PLAN  Urine micro and culture from sterile urine sample.  If negative, will increase Myrbetriq to 50 mg daily. Patient may not return to work at this time.  Follow up in 2 weeks.  Will reassess at 6 week post surgery visit to see when she is able to return to work following her hysterectomy.   She will contact her physician who is prescribing her Wellbutrin to see if she needs to make a change in her medication dosage or regimen.  I would recommend this approach before consideration of treatment with estrogen.

## 2020-11-13 NOTE — Telephone Encounter (Signed)
Rescheduled post-op appointment to 11 am today. Update due to disability company.  Routing to provider. Encounter closed.

## 2020-11-13 NOTE — Telephone Encounter (Signed)
Patient calling to reschedule post op appointment she had to cancel.

## 2020-11-14 ENCOUNTER — Encounter: Payer: Self-pay | Admitting: Obstetrics and Gynecology

## 2020-11-14 LAB — URINALYSIS, MICROSCOPIC ONLY
Bacteria, UA: NONE SEEN
Casts: NONE SEEN /lpf
WBC, UA: NONE SEEN /hpf (ref 0–5)

## 2020-11-15 LAB — URINE CULTURE: Organism ID, Bacteria: NO GROWTH

## 2020-11-16 ENCOUNTER — Inpatient Hospital Stay: Payer: 59 | Admitting: Family

## 2020-11-16 ENCOUNTER — Telehealth: Payer: Self-pay

## 2020-11-16 ENCOUNTER — Inpatient Hospital Stay: Payer: 59

## 2020-11-16 MED ORDER — FLUCONAZOLE 150 MG PO TABS: 150.0000 mg | ORAL_TABLET | Freq: Once | ORAL | 0 refills | Status: AC

## 2020-11-16 NOTE — Telephone Encounter (Signed)
Spoke with pt. Pt given results and recommendations per Dr Quincy Simmonds. Pt verbalized understanding and is agreeable to Rx.  Rx Diflucan sent to pharmacy on file. Pt states has taken before and does work, cannot use Monistat OTC (see allergy list).  Encounter closed

## 2020-11-16 NOTE — Telephone Encounter (Signed)
Patient is calling in regards to needing an appointment the week of (11/23/20). Dr. Quincy Simmonds wanted to see patient for follow up. Patient also states [place] needs to be contacted.

## 2020-11-16 NOTE — Telephone Encounter (Signed)
Spoke with pt. Pt states having needing 2 week follow up per Dr Quincy Simmonds. Per reviewed notes, ok to schedule. Pt scheduled with Dr Quincy Simmonds as work-in appt due to no available appts at this time. OV on 12/22 at 1145 am. Pt agreeable to date and time of appt.   Pt also stating we need to let [employer] know pt has appts on 12/22 at 12/07/20 . Advised will route message to [facility] for benefits.   Routing to [employer] closed

## 2020-11-16 NOTE — Telephone Encounter (Signed)
-----   Message from Nunzio Cobbs, MD sent at 11/16/2020  7:32 AM EST ----- Please contact patient with results of testing.  The urine micro showed some yeast, but the actual urine culture is negative for infection.  I recommend she treat with Diflucan 150 mg po x 1 and repeat in 72 hours.  Disp:  2 RF: none.

## 2020-11-19 ENCOUNTER — Telehealth: Payer: Self-pay

## 2020-11-19 NOTE — Telephone Encounter (Signed)
Call placed to pt. Spoke with pt. Pt needing to reschedule 6 week appt. Pt scheduled with Dr Quincy Simmonds on 1/6 at 30 am. Pt agreeable to date and time of appt.   Pt requesting [place] call to give appt dates to short term disability to be able to get payments while out of work.  Advised will send message to [facility]. Pt agreeable.   Routing to [employer] closed

## 2020-11-19 NOTE — Telephone Encounter (Signed)
Patient is aware that her post op is cancelled for 12/07/20 and will receive a call to reschedule.

## 2020-11-25 ENCOUNTER — Ambulatory Visit: Payer: Self-pay | Admitting: Obstetrics and Gynecology

## 2020-11-30 ENCOUNTER — Telehealth: Payer: Self-pay

## 2020-11-30 ENCOUNTER — Ambulatory Visit: Payer: Self-pay | Admitting: Obstetrics and Gynecology

## 2020-11-30 ENCOUNTER — Ambulatory Visit: Payer: 59 | Attending: Internal Medicine

## 2020-11-30 ENCOUNTER — Other Ambulatory Visit (HOSPITAL_BASED_OUTPATIENT_CLINIC_OR_DEPARTMENT_OTHER): Payer: Self-pay | Admitting: Internal Medicine

## 2020-11-30 DIAGNOSIS — Z23 Encounter for immunization: Secondary | ICD-10-CM

## 2020-11-30 NOTE — Telephone Encounter (Signed)
Patient had to take son back to emergency room. She will call in the morning and reschedule.

## 2020-11-30 NOTE — Telephone Encounter (Signed)
Thank you for the update!

## 2020-11-30 NOTE — Progress Notes (Signed)
   Covid-19 Vaccination Clinic  Name:  MAVIS GRAVELLE    MRN: [id number] DOB: 11-21-1966  11/30/2020  Ms. Befort was observed post Covid-19 immunization for 15 minutes without incident. She was provided with Vaccine Information Sheet and instruction to access the V-Safe system.   Ms. Arriaga was instructed to call 911 with any severe reactions post vaccine: Marland Kitchen Difficulty breathing  . Swelling of face and throat  . A fast heartbeat  . A bad rash all over body  . Dizziness and weakness   Immunizations Administered    Name Date Dose VIS Date Route   Pfizer COVID-19 Vaccine 11/30/2020  3:10 PM 0.3 mL 09/23/2020 Intramuscular   Manufacturer: [place]   Lot: [id number]   [place]: [id number]

## 2020-12-01 MED FILL — PFIZER-BIONTECH COVID-19 VA: 30 | 21 days supply | Qty: 0 | Fill #0

## 2020-12-01 NOTE — Telephone Encounter (Signed)
Call to patient for update.  Patient is scheduled for OV on 12/29 at 3:30pm, patient plans to keep OV as scheduled.  Patient reports she had to stand on a ladder over the weekend and pull her garage door down. Patient reports she has been "sore" in her lower abdomen since doing this.  Reports "heamtomas are getting smaller". Denies any vaginal bleeding, fever/chills.   Reviewed with patient previous recommendations -decrease activity, no outside chores or heavy house work or lifting over 10 lbs. Patient states she understands recommendations, expresses that she lives alone with her teenage son. Patient thankful for f/u and is aware to contact the office if she has any questions/concerns.   Routing to provider for final review. Patient is agreeable to disposition. Will close encounter.  Cc: Hayley Carder

## 2020-12-02 ENCOUNTER — Encounter: Payer: Self-pay | Admitting: Obstetrics and Gynecology

## 2020-12-02 ENCOUNTER — Other Ambulatory Visit: Payer: Self-pay

## 2020-12-02 ENCOUNTER — Ambulatory Visit (INDEPENDENT_AMBULATORY_CARE_PROVIDER_SITE_OTHER): Payer: 59 | Admitting: Obstetrics and Gynecology

## 2020-12-02 VITALS — BP 112/64 | HR 80 | Ht 63.0 in | Wt 201.0 lb

## 2020-12-02 DIAGNOSIS — Z90710 Acquired absence of both cervix and uterus: Secondary | ICD-10-CM

## 2020-12-02 DIAGNOSIS — N3281 Overactive bladder: Secondary | ICD-10-CM

## 2020-12-02 NOTE — Progress Notes (Signed)
GYNECOLOGY  VISIT   HPI: 54 y.o.   Divorced  Caucasian  female   G1P1 with Patient's last menstrual period was 03/20/2019 (approximate).   here for 4 week follow up status post TOTAL LAPAROSCOPIC HYSTERECTOMY WITH BILATERAL SALPINGECTOMY (N/A Abdomen) CYSTOSCOPY (N/A Bladder).  Some urgency to void.  On Myrbetriq 25 mg daily for overactive bladder.   Some had a tinge of bleeding with wiping today.   She has been more active with home repair issues.  Received her 2nd Covid vaccine.   Son has health issues.      GYNECOLOGIC HISTORY: Patient's last menstrual period was 03/20/2019 (approximate). Contraception: Tubal/Hyst Menopausal hormone therapy: none Last mammogram: 04/01/20 3D/Lt.Br.poss.mass;Rt.Br.Neg--Lt.Br.Diag was Neg/density B/Birads1 Last pap smear:  10-15-20 ASCUS:Pos HR HPV 04/22/20 LGSIL:Pos HR HPV 10-22-19 ASCUS:Pos HRHPV 05-02-19 Neg:Neg HR HPV 09-25-18 Neg:Neg HR HPV                OB History    Gravida  1   Para  1   Term      Preterm      AB      Living  1     SAB      IAB      Ectopic      Multiple  1   Live Births                 Patient Active Problem List   Diagnosis Date Noted  . Status post laparoscopic hysterectomy 10/26/2020  . Vitamin D deficiency 05/23/2017  . Anxiety 06/19/2015  . Anemia, iron deficiency 06/19/2015  . Pernicious anemia 06/19/2015  . 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 Protein-calorie malnutrition, severe ([place]) 04/18/2015  . Severe protein-calorie malnutrition ([place]) 04/18/2015  . 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 Bariatric surgery status 04/18/2015  . 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 GERD (gastroesophageal reflux disease) 04/17/2015  . Hypokalemia 04/16/2015  . Migraine with aura 05/21/2014  . 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 Abnormal Pap smear   . Anemia   . Anxiety   . Arthritis    neck  . Broken toe 12-22-15   middle toe right foot  . Cataract of both eyes   . Depression   . GERD (gastroesophageal reflux disease)   . Headache(784.0)   . Heart murmur   . History of hiatal hernia 2021   small  . History of kidney stones   . Hypothyroidism   . 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 Migraine   . Perforated bowel ([place])   . Pernicious anemia 06/19/2015  . PONV (postoperative nausea and vomiting)   . Thyroid disease    hypothyrodism    Past Surgical History:  Procedure Laterality Date  . CATARACT EXTRACTION, BILATERAL Bilateral 02/2016  . CERVICAL BIOPSY  W/ LOOP ELECTRODE EXCISION  11/2013, 05/2018, 11/2019   LGSIL, negative for dysplasia, LGSIL and positive ECC with potential LGSIL  - respectively  . 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 CESAREAN SECTION  2008  . CHOLECYSTECTOMY    . COLONOSCOPY  10/2020  . CYSTOSCOPY N/A 10/26/2020   Procedure: CYSTOSCOPY;  Surgeon: Nunzio Cobbs, MD;  Location: [facility];  Service: Gynecology;  Laterality: N/A;  . GASTRIC BYPASS  2002  . HYSTEROSCOPY WITH D & C  10/14/2011   Procedure: DILATATION AND CURETTAGE (D&C) /HYSTEROSCOPY;  Surgeon: Melony Overly;  Location: [facility];  Service: Gynecology;  Laterality: Bilateral;  . LASIK    . NOSE SURGERY    . STOMACH SURGERY  04/20/2015   dilation of "connection between stomach and intestine", [facility]  . TONSILLECTOMY    . TONSILLECTOMY    . TOTAL LAPAROSCOPIC HYSTERECTOMY WITH BILATERAL SALPINGO OOPHORECTOMY N/A 10/26/2020   Procedure: TOTAL LAPAROSCOPIC HYSTERECTOMY WITH BILATERAL SALPINGECTOMY;  Surgeon: Patton Salles, MD;  Location: [facility];  Service: Gynecology;  Laterality: N/A;  . TUBAL LIGATION  2008    Current  Outpatient Medications  Medication Sig Dispense Refill  . acetaminophen (TYLENOL) 325 MG tablet 1 tablet as needed    . AIMOVIG 140 MG/ML SOAJ Inject 140 mg as directed every 30 (thirty) days.     . ALPRAZolam (XANAX) 1 MG tablet Take 1 mg by mouth 2 (two) times daily as needed. Anxiety    . Armodafinil 250 MG tablet Take 250 mg by mouth every morning.  2  . baclofen (LIORESAL) 10 MG tablet Take 10 mg by mouth 2 (two) times daily as needed for muscle spasms.     . botulinum toxin Type A (BOTOX) 100 units SOLR injection once. Unaware of dose, pt gets q 48mo for migraines    . cyanocobalamin (,VITAMIN B-12,) 1000 MCG/ML injection Inject 1,000 mcg into the muscle every 21 ( twenty-one) days.     . cyclobenzaprine (FLEXERIL) 10 MG tablet 1 tablet as needed    . DEXILANT 60 MG capsule Take 60 mg by mouth daily.    Marland Kitchen dicyclomine (BENTYL) 10 MG capsule 1 capsule    . diphenhydrAMINE (BENADRYL) 12.5 MG/5ML elixir Take 12.5 mg by mouth every 6 (six) hours as needed for allergies.     Marland Kitchen divalproex (DEPAKOTE) 500 MG DR tablet Take 1 tablet (500 mg total) by mouth 2 (two) times daily. 180 tablet 4  . doxepin (SINEQUAN) 75 MG capsule Take 75 mg by mouth at bedtime.     Marland Kitchen doxycycline (ADOXA) 100 MG tablet Take 1 tablet (100 mg total) by mouth 2 (two) times daily. 14 tablet 0  . eletriptan (RELPAX) 40 MG tablet Take 1 tablet (40 mg total) by mouth as needed for migraine or headache. May repeat in 2 hours if needed. Max 2 tabs per day or 8 tabs per month. 10 tablet 12  . ergocalciferol (VITAMIN D2) 50000 UNITS capsule Take 50,000 Units by mouth See admin instructions. She is taking daily M-F - pr pt    . Eszopiclone 3 MG TABS Take 3 mg by mouth at bedtime.   1  . famotidine (PEPCID) 20 MG tablet one tab    . fexofenadine (ALLEGRA) 180 MG tablet Take 180 mg by mouth daily as needed. allergies    . fluticasone (FLONASE) 50 MCG/ACT nasal spray Place 1 spray into both nostrils as needed for allergies.   1  .  gabapentin (NEURONTIN) 300 MG capsule Take 300-900 mg by mouth See admin instructions. Taking 1 (300mg ) at night for 1 week, then 2 capsules (600mg ) for 2nd week, then 3 capsules (900mg ) at night until seen    . hydrOXYzine (ATARAX/VISTARIL) 25 MG tablet Take 25 mg by mouth every 8 (eight) hours as needed (headache).   2  . imipramine (TOFRANIL) 50 MG tablet Take 100 mg by mouth daily.   0  . lansoprazole (PREVACID) 30 MG capsule Take 30 mg by mouth daily as needed (acid reflux). Acid reflux    .  Lasmiditan Succinate 100 MG TABS Take 100 mg by mouth as needed (headache onset).     Marland Kitchen levothyroxine (SYNTHROID) 50 MCG tablet TAKE 1 TABLET BY MOUTH EVERY DAY 30 MINUTES BEFORE EATING (Patient taking differently: Take 50 mcg by mouth daily before breakfast. TAKE 1 TABLET BY MOUTH EVERY DAY 30 MINUTES BEFORE EATING) 90 tablet 3  . LINZESS 145 MCG CAPS capsule Take 145 mcg by mouth daily.  3  . Multiple Vitamins-Minerals (MULTIVITAMIN WITH MINERALS) tablet Take 1 tablet by mouth daily.    Marland Kitchen MYRBETRIQ 25 MG TB24 tablet TAKE 1 TABLET(25 MG) BY MOUTH DAILY (Patient taking differently: Take 25 mg by mouth daily.) 30 tablet 6  . nystatin (MYCOSTATIN/NYSTOP) powder Apply 1 application topically daily as needed (yeast infection).     . nystatin-triamcinolone (MYCOLOG II) cream Apply 1 application topically 2 (two) times daily. Apply to affected area BID for up to 7 days. (Patient taking differently: Apply 1 application topically 2 (two) times daily as needed (rash).) 60 g 0  . ondansetron (ZOFRAN) 4 MG tablet Take 4 mg by mouth every 8 (eight) hours as needed for nausea or vomiting.     Marland Kitchen OZEMPIC, 0.25 OR 0.5 MG/DOSE, 2 MG/1.5ML SOPN Inject 0.25 mg into the skin once a week. Sunday    . promethazine (PHENERGAN) 25 MG suppository Place 25 mg rectally every 6 (six) hours as needed for nausea or vomiting.     . thiamine (VITAMIN B-1) 50 MG tablet Take 50 mg by mouth daily.   0  . tiZANidine (ZANAFLEX) 4 MG tablet Take  2-4 mg by mouth at bedtime.   0  . UBRELVY 50 MG TABS Take 50 mg by mouth daily as needed (migraine).     . venlafaxine XR (EFFEXOR-XR) 150 MG 24 hr capsule Take 300 mg by mouth every morning.    . WELLBUTRIN XL 300 MG 24 hr tablet Take 300 mg by mouth daily.   1  . zonisamide (ZONEGRAN) 100 MG capsule TAKE 2 CAPSULES BY MOUTH EVERY NIGHT AT BEDTIME. Take along w/ one of the 50mg  caps for total daily dose of 250mg     . zonisamide (ZONEGRAN) 50 MG capsule Take by mouth.    . CONTRAVE 8-90 MG TB12 Take 2 tablets by mouth 2 (two) times daily. (Patient not taking: Reported on 12/02/2020)    . omeprazole (PRILOSEC) 20 MG capsule Take 20 mg by mouth daily as needed (heartburn).      No current facility-administered medications for this visit.     ALLERGIES: Naproxen, Monistat [miconazole], Sulfa antibiotics, and Tioconazole  Family History  Problem Relation Age of Onset  . Diabetes Mother   . Heart disease Father   . Social phobia Father   . Psychiatric Illness Father   . Hypertension Father   . Stroke Father   . Dementia Father   . Heart disease Other   . Diabetes Other   . Stroke Other   . Hypertension Maternal Grandmother   . Thyroid disease Maternal Grandmother   . Hypertension Paternal Grandmother   . Stroke Paternal Grandfather   . Breast cancer Neg Hx     Social History   Socioeconomic History  . Marital status: Divorced    Spouse name: Not on file  . Number of children: 1  . Years of education: College  . Highest education level: Not on file  Occupational History    Employer: [employer]  Tobacco Use  . Smoking status: Never Smoker  .  Smokeless tobacco: Never Used  Vaping Use  . Vaping Use: Never used  Substance and Sexual Activity  . Alcohol use: Yes    Alcohol/week: 0.0 standard drinks    Comment: Once a month-rarely  . Drug use: No  . Sexual activity: Not Currently    Partners: Male    Birth control/protection: Surgical    Comment: tubal  Other  Topics Concern  . Not on file  Social History Narrative   Pt lives at home with her family.   Caffeine Use: once daily   Social Determinants of Health   Financial Resource Strain: Not on file  Food Insecurity: Not on file  Transportation Needs: Not on file  Physical Activity: Not on file  Stress: Not on file  Social Connections: Not on file  Intimate Partner Violence: Not on file    Review of Systems  All other systems reviewed and are negative.   PHYSICAL EXAMINATION:    BP 112/64 (Cuff Size: Large)   Pulse 80   Ht 5\' 3"  (1.6 m)   Wt 201 lb (91.2 kg)   LMP 03/20/2019 (Approximate)   SpO2 98%   BMI 35.61 kg/m     General appearance: alert, cooperative and appears stated age   Abdomen: incisions intact with slight firmness under right and left lower incisions.  No ecchymoses.  Abdomen is soft, non-tender, no masses,  no organomegaly.   Pelvic: External genitalia:  no lesions              Urethra:  normal appearing urethra with no masses, tenderness or lesions              Bartholins and Skenes: normal                 Vagina: normal appearing vagina with normal color and discharge, no lesions              Cervix: absent.  Sutures and cuff intact.  Bleeding noted.                 Bimanual Exam:  Uterus:  absent              Adnexa: no mass, fullness, tenderness               Chaperone was present for exam.  ASSESSMENT  Status post total laparoscopic hysterectomy.  Slight vaginal bleeding noted today.   Incisional hematomas resolving.  Overactive bladder.   PLAN  FU visit in office on 12/18/19.  Return to work on 12/22/19. Avoid lifting, chores, and exercise. Increase Myrbetriq to 50 mg.  She will take two 25 mg pills by mouth daily. She will let me know if she needs refills.

## 2020-12-07 ENCOUNTER — Ambulatory Visit: Payer: 59 | Admitting: Obstetrics and Gynecology

## 2020-12-07 ENCOUNTER — Encounter: Payer: Self-pay | Admitting: Obstetrics and Gynecology

## 2020-12-10 ENCOUNTER — Ambulatory Visit: Payer: 59 | Admitting: Obstetrics and Gynecology

## 2020-12-11 ENCOUNTER — Other Ambulatory Visit: Payer: Self-pay

## 2020-12-16 NOTE — Progress Notes (Signed)
GYNECOLOGY  VISIT   HPI: 55 y.o.   Divorced  Caucasian  female   G1P1 with Patient's last menstrual period was 03/20/2019 (approximate).   here for 6 weeks status post TOTAL LAPAROSCOPIC HYSTERECTOMY WITH BILATERAL SALPINGECTOMY (N/A Abdomen) CYSTOSCOPY (N/A Bladder).  Right sided pain off and on. Movement is causing her pain.  Does not feel she is ready to return to work due to her pain.   Feels tired.  Not sleeping well at night.   Umbilicus if red, raw, and irritated. She is using medicated yeast powder.   She is having some vaginal bleeding off and on.   GYNECOLOGIC HISTORY: Patient's last menstrual period was 03/20/2019 (approximate). Contraception: Tubal/Hyst Menopausal hormone therapy:  None Last mammogram:  04/01/20 3D/Lt.Br.poss.mass;Rt.Br.Neg--Lt.Br.Diag was Neg/density B/Birads1 Last pap smear: 10-15-20 ASCUS:Pos HR HPV 04/22/20 LGSIL:Pos HR HPV 10-22-19 ASCUS:Pos HRHPV 05-02-19 Neg:Neg HR HPV 09-25-18 Neg:Neg HR HPV        OB History    Gravida  1   Para  1   Term      Preterm      AB      Living  1     SAB      IAB      Ectopic      Multiple  1   Live Births                 Patient Active Problem List   Diagnosis Date Noted  . Status post laparoscopic hysterectomy 10/26/2020  . Vitamin D deficiency 05/23/2017  . Anxiety 06/19/2015  . Anemia, iron deficiency 06/19/2015  . Pernicious anemia 06/19/2015  . 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 Protein-calorie malnutrition, severe ([place]) 04/18/2015  . Severe protein-calorie malnutrition ([place]) 04/18/2015  . 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 Bariatric surgery status 04/18/2015  . 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 GERD (gastroesophageal reflux disease) 04/17/2015  . Hypokalemia 04/16/2015  . Migraine with aura  05/21/2014  . 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 Abnormal Pap smear   . Anemia   . Anxiety   . Arthritis    neck  . Broken toe 12-22-15   middle toe right foot  . Cataract of both eyes   . Depression   . GERD (gastroesophageal reflux disease)   . Headache(784.0)   . Heart murmur   . History of hiatal hernia 2021   small  . History of kidney stones   . Hypothyroidism   . 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 Migraine   . Perforated bowel ([place])   . Pernicious anemia 06/19/2015  . PONV (postoperative nausea and vomiting)   . Thyroid disease    hypothyrodism    Past Surgical History:  Procedure Laterality Date  . CATARACT EXTRACTION, BILATERAL Bilateral 02/2016  . CERVICAL BIOPSY  W/ LOOP ELECTRODE EXCISION  11/2013, 05/2018, 11/2019   LGSIL, negative for dysplasia, LGSIL and positive ECC with potential LGSIL  - respectively  . 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 CESAREAN SECTION  2008  . CHOLECYSTECTOMY    . COLONOSCOPY  10/2020  . CYSTOSCOPY N/A 10/26/2020   Procedure: CYSTOSCOPY;  Surgeon: Nunzio Cobbs, MD;  Location: [facility];  Service: Gynecology;  Laterality: N/A;  . GASTRIC BYPASS  2002  . HYSTEROSCOPY WITH D & C  10/14/2011   Procedure: DILATATION AND CURETTAGE (D&C) /  HYSTEROSCOPY;  Surgeon: Arloa Koh;  Location: [facility];  Service: Gynecology;  Laterality: Bilateral;  . LASIK    . NOSE SURGERY    . STOMACH SURGERY  04/20/2015   dilation of "connection between stomach and intestine", [facility]  . TONSILLECTOMY    . TONSILLECTOMY    . TOTAL LAPAROSCOPIC HYSTERECTOMY WITH BILATERAL SALPINGO OOPHORECTOMY N/A 10/26/2020   Procedure: TOTAL LAPAROSCOPIC HYSTERECTOMY WITH BILATERAL SALPINGECTOMY;  Surgeon: Nunzio Cobbs, MD;  Location: [facility];  Service: Gynecology;  Laterality:  N/A;  . TUBAL LIGATION  2008    Current Outpatient Medications  Medication Sig Dispense Refill  . acetaminophen (TYLENOL) 325 MG tablet 1 tablet as needed    . AIMOVIG 140 MG/ML SOAJ Inject 140 mg as directed every 30 (thirty) days.     . ALPRAZolam (XANAX) 1 MG tablet Take 1 mg by mouth 2 (two) times daily as needed. Anxiety    . Armodafinil 250 MG tablet Take 250 mg by mouth every morning.  2  . baclofen (LIORESAL) 10 MG tablet Take 10 mg by mouth 2 (two) times daily as needed for muscle spasms.     . botulinum toxin Type A (BOTOX) 100 units SOLR injection once. Unaware of dose, pt gets q 21mo for migraines    . CONTRAVE 8-90 MG TB12 Take 2 tablets by mouth 2 (two) times daily.    . cyanocobalamin (,VITAMIN B-12,) 1000 MCG/ML injection Inject 1,000 mcg into the muscle every 21 ( twenty-one) days.     . cyclobenzaprine (FLEXERIL) 10 MG tablet 1 tablet as needed    . DEXILANT 60 MG capsule Take 60 mg by mouth daily.    Marland Kitchen dicyclomine (BENTYL) 10 MG capsule 1 capsule    . diphenhydrAMINE (BENADRYL) 12.5 MG/5ML elixir Take 12.5 mg by mouth every 6 (six) hours as needed for allergies.     Marland Kitchen divalproex (DEPAKOTE) 500 MG DR tablet Take 1 tablet (500 mg total) by mouth 2 (two) times daily. 180 tablet 4  . doxepin (SINEQUAN) 75 MG capsule Take 75 mg by mouth at bedtime.     Marland Kitchen doxycycline (ADOXA) 100 MG tablet Take 1 tablet (100 mg total) by mouth 2 (two) times daily. 14 tablet 0  . eletriptan (RELPAX) 40 MG tablet Take 1 tablet (40 mg total) by mouth as needed for migraine or headache. May repeat in 2 hours if needed. Max 2 tabs per day or 8 tabs per month. 10 tablet 12  . ergocalciferol (VITAMIN D2) 50000 UNITS capsule Take 50,000 Units by mouth See admin instructions. She is taking daily M-F - pr pt    . Eszopiclone 3 MG TABS Take 3 mg by mouth at bedtime.   1  . famotidine (PEPCID) 20 MG tablet one tab    . fexofenadine (ALLEGRA) 180 MG tablet Take 180 mg by mouth daily as needed. allergies    .  fluticasone (FLONASE) 50 MCG/ACT nasal spray Place 1 spray into both nostrils as needed for allergies.   1  . gabapentin (NEURONTIN) 300 MG capsule Take 300-900 mg by mouth See admin instructions. Taking 1 (300mg ) at night for 1 week, then 2 capsules (600mg ) for 2nd week, then 3 capsules (900mg ) at night until seen    . hydrOXYzine (ATARAX/VISTARIL) 25 MG tablet Take 25 mg by mouth every 8 (eight) hours as needed (headache).   2  . imipramine (TOFRANIL) 50 MG tablet Take 100 mg by mouth daily.   0  .  lansoprazole (PREVACID) 30 MG capsule Take 30 mg by mouth daily as needed (acid reflux). Acid reflux    . Lasmiditan Succinate 100 MG TABS Take 100 mg by mouth as needed (headache onset).     Marland Kitchen levothyroxine (SYNTHROID) 50 MCG tablet TAKE 1 TABLET BY MOUTH EVERY DAY 30 MINUTES BEFORE EATING (Patient taking differently: Take 50 mcg by mouth daily before breakfast. TAKE 1 TABLET BY MOUTH EVERY DAY 30 MINUTES BEFORE EATING) 90 tablet 3  . LINZESS 145 MCG CAPS capsule Take 145 mcg by mouth daily.  3  . Multiple Vitamins-Minerals (MULTIVITAMIN WITH MINERALS) tablet Take 1 tablet by mouth daily.    Marland Kitchen MYRBETRIQ 25 MG TB24 tablet TAKE 1 TABLET(25 MG) BY MOUTH DAILY (Patient taking differently: Take 25 mg by mouth daily.) 30 tablet 6  . nystatin (MYCOSTATIN/NYSTOP) powder Apply 1 application topically daily as needed (yeast infection).     . nystatin-triamcinolone (MYCOLOG II) cream Apply 1 application topically 2 (two) times daily. Apply to affected area BID for up to 7 days. (Patient taking differently: Apply 1 application topically 2 (two) times daily as needed (rash).) 60 g 0  . ondansetron (ZOFRAN) 4 MG tablet Take 4 mg by mouth every 8 (eight) hours as needed for nausea or vomiting.     Marland Kitchen OZEMPIC, 0.25 OR 0.5 MG/DOSE, 2 MG/1.5ML SOPN Inject 0.25 mg into the skin once a week. Sunday    . promethazine (PHENERGAN) 25 MG suppository Place 25 mg rectally every 6 (six) hours as needed for nausea or vomiting.     .  thiamine (VITAMIN B-1) 50 MG tablet Take 50 mg by mouth daily.   0  . tiZANidine (ZANAFLEX) 4 MG tablet Take 2-4 mg by mouth at bedtime.   0  . UBRELVY 50 MG TABS Take 50 mg by mouth daily as needed (migraine).     . venlafaxine XR (EFFEXOR-XR) 150 MG 24 hr capsule Take 300 mg by mouth every morning.    . WELLBUTRIN XL 300 MG 24 hr tablet Take 300 mg by mouth daily.   1  . zonisamide (ZONEGRAN) 100 MG capsule TAKE 2 CAPSULES BY MOUTH EVERY NIGHT AT BEDTIME. Take along w/ one of the 50mg  caps for total daily dose of 250mg     . zonisamide (ZONEGRAN) 50 MG capsule Take by mouth.    Marland Kitchen omeprazole (PRILOSEC) 20 MG capsule Take 20 mg by mouth daily as needed (heartburn).      No current facility-administered medications for this visit.     ALLERGIES: Naproxen, Monistat [miconazole], Sulfa antibiotics, and Tioconazole  Family History  Problem Relation Age of Onset  . Diabetes Mother   . Heart disease Father   . Social phobia Father   . Psychiatric Illness Father   . Hypertension Father   . Stroke Father   . Dementia Father   . Heart disease Other   . Diabetes Other   . Stroke Other   . Hypertension Maternal Grandmother   . Thyroid disease Maternal Grandmother   . Hypertension Paternal Grandmother   . Stroke Paternal Grandfather   . Breast cancer Neg Hx     Social History   Socioeconomic History  . Marital status: Divorced    Spouse name: Not on file  . Number of children: 1  . Years of education: College  . Highest education level: Not on file  Occupational History    Employer: [employer]  Tobacco Use  . Smoking status: Never Smoker  . Smokeless tobacco:  Never Used  Vaping Use  . Vaping Use: Never used  Substance and Sexual Activity  . Alcohol use: Yes    Alcohol/week: 0.0 standard drinks    Comment: Once a month-rarely  . Drug use: No  . Sexual activity: Not Currently    Partners: Male    Birth control/protection: Surgical    Comment: tubal  Other Topics  Concern  . Not on file  Social History Narrative   Pt lives at home with her family.   Caffeine Use: once daily   Social Determinants of Health   Financial Resource Strain: Not on file  Food Insecurity: Not on file  Transportation Needs: Not on file  Physical Activity: Not on file  Stress: Not on file  Social Connections: Not on file  Intimate Partner Violence: Not on file    Review of Systems  All other systems reviewed and are negative.   PHYSICAL EXAMINATION:    BP 118/70   Pulse 82   Ht 5\' 3"  (1.6 m)   Wt 202 lb (91.6 kg)   LMP 03/20/2019 (Approximate)   SpO2 98%   BMI 35.78 kg/m     General appearance: alert, cooperative and appears stated age  Abdomen: soft, non-tender, no masses,  no organomegaly.  Incisions intact.   Pelvic: External genitalia:  no lesions              Urethra:  normal appearing urethra with no masses, tenderness or lesions              Bartholins and Skenes: normal                 Vagina: normal appearing vagina with normal color and discharge, no lesions              Cervix: absent.  Suture line still present with some minor blood noted.                 Bimanual Exam:  Uterus:  absent              Adnexa: no mass, fullness, tenderness            Chaperone was present for exam.  ASSESSMENT  Status post laparoscopic hysterectomy.  Still healing.   PLAN  Will need two additional weeks off from work for recovery.  She will return on 01/04/21.  Return for next post op visit in 2 weeks.

## 2020-12-17 ENCOUNTER — Other Ambulatory Visit: Payer: Self-pay

## 2020-12-17 ENCOUNTER — Ambulatory Visit (INDEPENDENT_AMBULATORY_CARE_PROVIDER_SITE_OTHER): Payer: 59 | Admitting: Obstetrics and Gynecology

## 2020-12-17 ENCOUNTER — Telehealth: Payer: Self-pay | Admitting: Obstetrics and Gynecology

## 2020-12-17 ENCOUNTER — Encounter: Payer: Self-pay | Admitting: Obstetrics and Gynecology

## 2020-12-17 VITALS — BP 118/70 | HR 82 | Ht 63.0 in | Wt 202.0 lb

## 2020-12-17 DIAGNOSIS — Z9889 Other specified postprocedural states: Secondary | ICD-10-CM

## 2020-12-17 NOTE — Telephone Encounter (Signed)
Patient will be extending time off from work for recovery from surgery.  She will now return on 01/04/21.    She is having pain and is still healing from surgery.   She will have a recheck with me in 2 weeks.

## 2020-12-18 ENCOUNTER — Encounter: Payer: Self-pay | Admitting: Obstetrics and Gynecology

## 2020-12-22 NOTE — Telephone Encounter (Signed)
Updated FMLA paperwork faxed to [employer] to extend time off to return on 01/04/2021.  Encounter closed.

## 2020-12-30 NOTE — Progress Notes (Unsigned)
GYNECOLOGY  VISIT   HPI: 55 y.o.   Divorced  Caucasian  female   G1P1 with Patient's last menstrual period was 03/20/2019 (approximate).   here for 8 weeks follow up status post TOTAL LAPAROSCOPIC HYSTERECTOMY WITH BILATERAL SALPINGECTOMY (N/A Abdomen) CYSTOSCOPY (N/A Bladder)  Occasional spotting.   May have occasional twinge in her abdomen.   Still tired.   Some headaches and hot flashes.  Does have some shortness of breath with activity, like walking to the mailbox.  Can feel some shortness of breath at rest.  No chest pain.   GYNECOLOGIC HISTORY: Patient's last menstrual period was 03/20/2019 (approximate). Contraception:  Tubal/Hyst Menopausal hormone therapy:  none Last mammogram: 04-01-20 3D/Lt.Br.poss.mass;Rt.Br.Neg--Lt.Br.Diag was Neg/density B/BiRads1 Last pap smear:  10-15-20 ASCUS:Pos HR HPV                              04/22/20 LGSIL:Pos HR HPV                             10-22-19 ASCUS:Pos HRHPV        OB History    Gravida  1   Para  1   Term      Preterm      AB      Living  1     SAB      IAB      Ectopic      Multiple  1   Live Births                 Patient Active Problem List   Diagnosis Date Noted  . Status post laparoscopic hysterectomy 10/26/2020  . Vitamin D deficiency 05/23/2017  . Anxiety 06/19/2015  . Anemia, iron deficiency 06/19/2015  . Pernicious anemia 06/19/2015  . 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 Protein-calorie malnutrition, severe ([place]) 04/18/2015  . Severe protein-calorie malnutrition ([place]) 04/18/2015  . 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 Bariatric surgery status 04/18/2015  . 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 GERD (gastroesophageal reflux disease) 04/17/2015  . Hypokalemia 04/16/2015  . Migraine with aura 05/21/2014  . 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 Abnormal  Pap smear   . Anemia   . Anxiety   . Arthritis    neck  . Broken toe 12-22-15   middle toe right foot  . Cataract of both eyes   . Depression   . GERD (gastroesophageal reflux disease)   . Headache(784.0)   . Heart murmur   . History of hiatal hernia 2021   small  . History of kidney stones   . Hypothyroidism   . 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 Migraine   . Perforated bowel ([place])   . Pernicious anemia 06/19/2015  . PONV (postoperative nausea and vomiting)   . Thyroid disease    hypothyrodism    Past Surgical History:  Procedure Laterality Date  . CATARACT EXTRACTION, BILATERAL Bilateral 02/2016  . CERVICAL BIOPSY  W/ LOOP ELECTRODE EXCISION  11/2013, 05/2018, 11/2019   LGSIL, negative for dysplasia, LGSIL and positive ECC with potential LGSIL  - respectively  . 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 CESAREAN SECTION  2008  . CHOLECYSTECTOMY    . COLONOSCOPY  10/2020  . CYSTOSCOPY N/A 10/26/2020   Procedure: CYSTOSCOPY;  Surgeon:  Nunzio Cobbs, MD;  Location: [facility];  Service: Gynecology;  Laterality: N/A;  . GASTRIC BYPASS  2002  . HYSTEROSCOPY WITH D & C  10/14/2011   Procedure: DILATATION AND CURETTAGE (D&C) /HYSTEROSCOPY;  Surgeon: Arloa Koh;  Location: [facility];  Service: Gynecology;  Laterality: Bilateral;  . LASIK    . NOSE SURGERY    . STOMACH SURGERY  04/20/2015   dilation of "connection between stomach and intestine", [facility]  . TONSILLECTOMY    . TONSILLECTOMY    . TOTAL LAPAROSCOPIC HYSTERECTOMY WITH BILATERAL SALPINGO OOPHORECTOMY N/A 10/26/2020   Procedure: TOTAL LAPAROSCOPIC HYSTERECTOMY WITH BILATERAL SALPINGECTOMY;  Surgeon: Nunzio Cobbs, MD;  Location: [facility];  Service: Gynecology;  Laterality: N/A;  . TUBAL LIGATION  2008    Current Outpatient Medications  Medication Sig Dispense Refill  . acetaminophen (TYLENOL) 325 MG  tablet 1 tablet as needed    . AIMOVIG 140 MG/ML SOAJ Inject 140 mg as directed every 30 (thirty) days.     . ALPRAZolam (XANAX) 1 MG tablet Take 1 mg by mouth 2 (two) times daily as needed. Anxiety    . Armodafinil 250 MG tablet Take 250 mg by mouth every morning.  2  . baclofen (LIORESAL) 10 MG tablet Take 10 mg by mouth 2 (two) times daily as needed for muscle spasms.     . botulinum toxin Type A (BOTOX) 100 units SOLR injection once. Unaware of dose, pt gets q 67mo for migraines    . CONTRAVE 8-90 MG TB12 Take 2 tablets by mouth 2 (two) times daily.    . cyanocobalamin (,VITAMIN B-12,) 1000 MCG/ML injection Inject 1,000 mcg into the muscle every 21 ( twenty-one) days.     . cyclobenzaprine (FLEXERIL) 10 MG tablet 1 tablet as needed    . DEXILANT 60 MG capsule Take 60 mg by mouth daily.    Marland Kitchen dicyclomine (BENTYL) 10 MG capsule 1 capsule    . diphenhydrAMINE (BENADRYL) 12.5 MG/5ML elixir Take 12.5 mg by mouth every 6 (six) hours as needed for allergies.     Marland Kitchen divalproex (DEPAKOTE) 500 MG DR tablet Take 1 tablet (500 mg total) by mouth 2 (two) times daily. 180 tablet 4  . doxepin (SINEQUAN) 75 MG capsule Take 75 mg by mouth at bedtime.     Marland Kitchen eletriptan (RELPAX) 40 MG tablet Take 1 tablet (40 mg total) by mouth as needed for migraine or headache. May repeat in 2 hours if needed. Max 2 tabs per day or 8 tabs per month. 10 tablet 12  . ergocalciferol (VITAMIN D2) 50000 UNITS capsule Take 50,000 Units by mouth See admin instructions. She is taking daily M-F - pr pt    . Eszopiclone 3 MG TABS Take 3 mg by mouth at bedtime.   1  . famotidine (PEPCID) 20 MG tablet one tab    . fexofenadine (ALLEGRA) 180 MG tablet Take 180 mg by mouth daily as needed. allergies    . fluticasone (FLONASE) 50 MCG/ACT nasal spray Place 1 spray into both nostrils as needed for allergies.   1  . gabapentin (NEURONTIN) 300 MG capsule Take 300-900 mg by mouth See admin instructions. Taking 1 (300mg ) at night for 1 week, then 2  capsules (600mg ) for 2nd week, then 3 capsules (900mg ) at night until seen    . hydrOXYzine (ATARAX/VISTARIL) 25 MG tablet Take 25 mg by mouth every 8 (eight) hours as needed (headache).  2  . imipramine (TOFRANIL) 50 MG tablet Take 100 mg by mouth daily.   0  . lansoprazole (PREVACID) 30 MG capsule Take 30 mg by mouth daily as needed (acid reflux). Acid reflux    . Lasmiditan Succinate 100 MG TABS Take 100 mg by mouth as needed (headache onset).     Marland Kitchen levothyroxine (SYNTHROID) 50 MCG tablet TAKE 1 TABLET BY MOUTH EVERY DAY 30 MINUTES BEFORE EATING (Patient taking differently: Take 50 mcg by mouth daily before breakfast. TAKE 1 TABLET BY MOUTH EVERY DAY 30 MINUTES BEFORE EATING) 90 tablet 3  . LINZESS 145 MCG CAPS capsule Take 145 mcg by mouth daily.  3  . Multiple Vitamins-Minerals (MULTIVITAMIN WITH MINERALS) tablet Take 1 tablet by mouth daily.    Marland Kitchen MYRBETRIQ 25 MG TB24 tablet TAKE 1 TABLET(25 MG) BY MOUTH DAILY (Patient taking differently: Take 25 mg by mouth daily.) 30 tablet 6  . nystatin (MYCOSTATIN/NYSTOP) powder Apply 1 application topically daily as needed (yeast infection).     . nystatin-triamcinolone (MYCOLOG II) cream Apply 1 application topically 2 (two) times daily. Apply to affected area BID for up to 7 days. (Patient taking differently: Apply 1 application topically 2 (two) times daily as needed (rash).) 60 g 0  . ondansetron (ZOFRAN) 4 MG tablet Take 4 mg by mouth every 8 (eight) hours as needed for nausea or vomiting.     Marland Kitchen OZEMPIC, 0.25 OR 0.5 MG/DOSE, 2 MG/1.5ML SOPN Inject 0.25 mg into the skin once a week. Sunday    . promethazine (PHENERGAN) 25 MG suppository Place 25 mg rectally every 6 (six) hours as needed for nausea or vomiting.     . thiamine (VITAMIN B-1) 50 MG tablet Take 50 mg by mouth daily.   0  . tiZANidine (ZANAFLEX) 4 MG tablet Take 2-4 mg by mouth at bedtime.   0  . UBRELVY 50 MG TABS Take 50 mg by mouth daily as needed (migraine).     . venlafaxine XR  (EFFEXOR-XR) 150 MG 24 hr capsule Take 300 mg by mouth every morning.    . WELLBUTRIN XL 300 MG 24 hr tablet Take 300 mg by mouth daily.   1  . zonisamide (ZONEGRAN) 100 MG capsule TAKE 2 CAPSULES BY MOUTH EVERY NIGHT AT BEDTIME. Take along w/ one of the 50mg  caps for total daily dose of 250mg     . zonisamide (ZONEGRAN) 50 MG capsule Take by mouth.    Marland Kitchen omeprazole (PRILOSEC) 20 MG capsule Take 20 mg by mouth daily as needed (heartburn).      No current facility-administered medications for this visit.     ALLERGIES: Naproxen, Monistat [miconazole], Sulfa antibiotics, and Tioconazole  Family History  Problem Relation Age of Onset  . Diabetes Mother   . Heart disease Father   . Social phobia Father   . Psychiatric Illness Father   . Hypertension Father   . Stroke Father   . Dementia Father   . Heart disease Other   . Diabetes Other   . Stroke Other   . Hypertension Maternal Grandmother   . Thyroid disease Maternal Grandmother   . Hypertension Paternal Grandmother   . Stroke Paternal Grandfather   . Breast cancer Neg Hx     Social History   Socioeconomic History  . Marital status: Divorced    Spouse name: Not on file  . Number of children: 1  . Years of education: College  . Highest education level: Not on file  Occupational History  Employer: [employer]  Tobacco Use  . Smoking status: Never Smoker  . Smokeless tobacco: Never Used  Vaping Use  . Vaping Use: Never used  Substance and Sexual Activity  . Alcohol use: Yes    Alcohol/week: 0.0 standard drinks    Comment: Once a month-rarely  . Drug use: No  . Sexual activity: Not Currently    Partners: Male    Birth control/protection: Surgical    Comment: tubal  Other Topics Concern  . Not on file  Social History Narrative   Pt lives at home with her family.   Caffeine Use: once daily   Social Determinants of Health   Financial Resource Strain: Not on file  Food Insecurity: Not on file  Transportation  Needs: Not on file  Physical Activity: Not on file  Stress: Not on file  Social Connections: Not on file  Intimate Partner Violence: Not on file    Review of Systems  All other systems reviewed and are negative.   PHYSICAL EXAMINATION:    BP 112/80 (Cuff Size: Large)   Pulse 94   Ht 5\' 3"  (1.6 m)   Wt 198 lb (89.8 kg)   LMP 03/20/2019 (Approximate)   SpO2 97%   BMI 35.07 kg/m     General appearance: alert, cooperative and appears stated age Head: Normocephalic, without obvious abnormality, atraumatic Lungs: clear to auscultation bilaterally Heart: regular rate and rhythm Abdomen: soft, non-tender, no masses,  no organomegaly   Pelvic: External genitalia:  no lesions              Urethra:  normal appearing urethra with no masses, tenderness or lesions              Bartholins and Skenes: normal                 Vagina: normal appearing vagina with normal color and discharge, no lesions              Cervix: absent.  Suture line present.   Small amount of granulation tissue at cuff, not treated today.                Bimanual Exam:  Uterus:  absent              Adnexa: no mass, fullness, tenderness              Chaperone was present for exam.  ASSESSMENT  Status post laparoscopic hysterectomy with bilateral salpingectomy, cystoscopy.  Doing well.  Small granulation tissue at cuff.   PLAN  Ok to return to work on 01/04/21. No sexual activity or exercise.  Light chores at home are ok. Fu 3 weeks for final post op visit.

## 2020-12-31 ENCOUNTER — Encounter: Payer: Self-pay | Admitting: Obstetrics and Gynecology

## 2020-12-31 ENCOUNTER — Telehealth: Payer: Self-pay | Admitting: Obstetrics and Gynecology

## 2020-12-31 ENCOUNTER — Other Ambulatory Visit: Payer: Self-pay

## 2020-12-31 ENCOUNTER — Ambulatory Visit (INDEPENDENT_AMBULATORY_CARE_PROVIDER_SITE_OTHER): Payer: 59 | Admitting: Obstetrics and Gynecology

## 2020-12-31 VITALS — BP 112/80 | HR 94 | Ht 63.0 in | Wt 198.0 lb

## 2020-12-31 DIAGNOSIS — Z9889 Other specified postprocedural states: Secondary | ICD-10-CM

## 2020-12-31 NOTE — Telephone Encounter (Signed)
Please communicate to [facility] that patient is released back to normal work on 01/04/21.   Thank you.

## 2021-01-04 NOTE — Telephone Encounter (Signed)
Last update sent to [employer] released patient back to work as of 01/04/2021.   Routing to Dr. Quincy Simmonds as Juluis Rainier.  Encounter closed.

## 2021-01-21 ENCOUNTER — Telehealth: Payer: Self-pay | Admitting: Obstetrics and Gynecology

## 2021-01-21 NOTE — Telephone Encounter (Signed)
Please contact patient and let her know that her prescription for Myrbetriq is no longer going to be a medication of her insurance preferred list.   This means that she will pay full cost for this medication, and she will need to check with her pharmacy to see the price.   There are several other medications which are covered such as oxybutynin extended release, [place], Vesicare, Detrol, and Toviaz.

## 2021-01-21 NOTE — Telephone Encounter (Signed)
Mailbox is full and I cannot leave a message.

## 2021-01-22 ENCOUNTER — Ambulatory Visit: Payer: 59 | Admitting: Obstetrics and Gynecology

## 2021-02-02 ENCOUNTER — Encounter: Payer: Self-pay | Admitting: *Deleted

## 2021-02-02 NOTE — Telephone Encounter (Signed)
Sent mychart message

## 2021-02-15 ENCOUNTER — Encounter: Payer: Self-pay | Admitting: Obstetrics and Gynecology

## 2021-02-15 ENCOUNTER — Ambulatory Visit (INDEPENDENT_AMBULATORY_CARE_PROVIDER_SITE_OTHER): Payer: 59 | Admitting: Obstetrics and Gynecology

## 2021-02-15 ENCOUNTER — Other Ambulatory Visit: Payer: Self-pay

## 2021-02-15 VITALS — BP 128/76 | HR 105 | Ht 63.0 in | Wt 196.0 lb

## 2021-02-15 DIAGNOSIS — Z90710 Acquired absence of both cervix and uterus: Secondary | ICD-10-CM

## 2021-02-15 DIAGNOSIS — N3281 Overactive bladder: Secondary | ICD-10-CM

## 2021-02-15 MED ORDER — MIRABEGRON ER 50 MG PO TB24: ORAL_TABLET | ORAL | 3 refills | Status: DC

## 2021-02-15 NOTE — Progress Notes (Signed)
GYNECOLOGY  VISIT   HPI: 55 y.o.   Divorced  Caucasian  female   G1P1 with Patient's last menstrual period was 03/20/2019 (approximate).   here for 14 week follow up status post TOTAL LAPAROSCOPIC HYSTERECTOMY WITH BILATERAL SALPINGECTOMY (N/A Abdomen) CYSTOSCOPY (N/A Bladder).   Feeling good from a surgical recovery standpoint.  No bleeding.  No pain.   Needs refill on Myrbetriq 50mg . It is working well for her.  Having dry mouth.   Struggling with depression.  Hard to get up in the morning.  Dr. Noemi Chapel is treating with Wellbutrin.  Considering therapy.  Denies suicidal ideation.   GYNECOLOGIC HISTORY: Patient's last menstrual period was 03/20/2019 (approximate). Contraception: tubal/ Hyst Menopausal hormone therapy:  none Last mammogram: 04-01-20 3D/Lt.Br.poss.mass;Rt.Br.Neg--Lt.Br.Diag was Neg/density B/BiRads1 Last pap smear: 10-15-20 ASCUS:Pos HR HPV 04/22/20 LGSIL:Pos HR HPV 10-22-19 ASCUS:Pos HRHPV        OB History    Gravida  1   Para  1   Term      Preterm      AB      Living  1     SAB      IAB      Ectopic      Multiple  1   Live Births                 Patient Active Problem List   Diagnosis Date Noted  . Status post laparoscopic hysterectomy 10/26/2020  . Vitamin D deficiency 05/23/2017  . Anxiety 06/19/2015  . Anemia, iron deficiency 06/19/2015  . Pernicious anemia 06/19/2015  . 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 Protein-calorie malnutrition, severe ([place]) 04/18/2015  . Severe protein-calorie malnutrition ([place]) 04/18/2015  . 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 Bariatric surgery status 04/18/2015  . 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 GERD (gastroesophageal reflux disease) 04/17/2015  . Hypokalemia 04/16/2015  . Migraine with aura 05/21/2014  . 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 Abnormal Pap smear   . Anemia   . Anxiety   . Arthritis    neck  . Broken toe 12-22-15   middle toe right foot  . Cataract of both eyes   . Depression   . GERD (gastroesophageal reflux disease)   . Headache(784.0)   . Heart murmur   . History of hiatal hernia 2021   small  . History of kidney stones   . Hypothyroidism   . 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 Migraine   . Perforated bowel ([place])   . Pernicious anemia 06/19/2015  . PONV (postoperative nausea and vomiting)   . Thyroid disease    hypothyrodism    Past Surgical History:  Procedure Laterality Date  . CATARACT EXTRACTION, BILATERAL Bilateral 02/2016  . CERVICAL BIOPSY  W/ LOOP ELECTRODE EXCISION  11/2013, 05/2018, 11/2019   LGSIL, negative for dysplasia, LGSIL and positive ECC with potential LGSIL  - respectively  . 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 CESAREAN SECTION  2008  . CHOLECYSTECTOMY    . COLONOSCOPY  10/2020  . CYSTOSCOPY N/A 10/26/2020   Procedure: CYSTOSCOPY;  Surgeon: Nunzio Cobbs, MD;  Location: [facility];  Service: Gynecology;  Laterality: N/A;  . GASTRIC BYPASS  2002  . HYSTEROSCOPY WITH D & C  10/14/2011   Procedure: DILATATION AND CURETTAGE (D&C) /HYSTEROSCOPY;  Surgeon: Arloa Koh;  Location:  [facility];  Service: Gynecology;  Laterality: Bilateral;  . LASIK    . NOSE SURGERY    . STOMACH SURGERY  04/20/2015   dilation of "connection between stomach and intestine", [facility]  . TONSILLECTOMY    . TONSILLECTOMY    . TOTAL LAPAROSCOPIC HYSTERECTOMY WITH BILATERAL SALPINGO OOPHORECTOMY N/A 10/26/2020   Procedure: TOTAL LAPAROSCOPIC HYSTERECTOMY WITH BILATERAL SALPINGECTOMY;  Surgeon: Nunzio Cobbs, MD;  Location: [facility];  Service: Gynecology;  Laterality: N/A;  . TUBAL LIGATION  2008    Current Outpatient Medications  Medication  Sig Dispense Refill  . acetaminophen (TYLENOL) 325 MG tablet 1 tablet as needed    . AIMOVIG 140 MG/ML SOAJ Inject 140 mg as directed every 30 (thirty) days.     . ALPRAZolam (XANAX) 1 MG tablet Take 1 mg by mouth 2 (two) times daily as needed. Anxiety    . Armodafinil 250 MG tablet Take 250 mg by mouth every morning.  2  . Botulinum Toxin Type A (BOTOX) 200 units SOLR INJECT UP TO 200 UNITS  INTRAMUSCULARLY TO HEAD,  NECK, AND SHOULDERS EVERY  12 WEEKS (GIVEN AT MD  OFFICE, DISCARD UNUSED)    . CONTRAVE 8-90 MG TB12 Take 2 tablets by mouth 2 (two) times daily.    . cyanocobalamin (,VITAMIN B-12,) 1000 MCG/ML injection Inject 1,000 mcg into the muscle every 21 ( twenty-one) days.     . cyclobenzaprine (FLEXERIL) 10 MG tablet 1 tablet as needed    . DEXILANT 60 MG capsule Take 60 mg by mouth daily.    Marland Kitchen dicyclomine (BENTYL) 10 MG capsule 1 capsule    . diphenhydrAMINE (BENADRYL) 12.5 MG/5ML elixir Take 12.5 mg by mouth every 6 (six) hours as needed for allergies.     Marland Kitchen divalproex (DEPAKOTE) 500 MG DR tablet Take 1 tablet (500 mg total) by mouth 2 (two) times daily. 180 tablet 4  . doxepin (SINEQUAN) 75 MG capsule Take 75 mg by mouth at bedtime.     Marland Kitchen eletriptan (RELPAX) 40 MG tablet Take 1 tablet (40 mg total) by mouth as needed for migraine or headache. May repeat in 2 hours if needed. Max 2 tabs per day or 8 tabs per month. 10 tablet 12  . ergocalciferol (VITAMIN D2) 50000 UNITS capsule Take 50,000 Units by mouth See admin instructions. She is taking daily M-F - pr pt    . Eszopiclone 3 MG TABS Take 3 mg by mouth at bedtime.   1  . famotidine (PEPCID) 20 MG tablet one tab    . fexofenadine (ALLEGRA) 180 MG tablet Take 180 mg by mouth daily as needed. allergies    . fluticasone (FLONASE) 50 MCG/ACT nasal spray Place 1 spray into both nostrils as needed for allergies.   1  . gabapentin (NEURONTIN) 300 MG capsule Take 300-900 mg by mouth See admin instructions. Taking 1 (300mg ) at night for 1 week,  then 2 capsules (600mg ) for 2nd week, then 3 capsules (900mg ) at night until seen    . hydrOXYzine (ATARAX/VISTARIL) 25 MG tablet Take 25 mg by mouth every 8 (eight) hours as needed (headache).   2  . imipramine (TOFRANIL) 50 MG tablet Take 100 mg by mouth daily.   0  . lansoprazole (PREVACID) 30 MG capsule Take 30 mg by mouth daily as needed (acid reflux). Acid reflux    . Lasmiditan Succinate 100 MG TABS Take 100 mg by mouth as needed (headache onset).     Marland Kitchen  levothyroxine (SYNTHROID) 50 MCG tablet TAKE 1 TABLET BY MOUTH EVERY DAY 30 MINUTES BEFORE EATING (Patient taking differently: Take 50 mcg by mouth daily before breakfast. TAKE 1 TABLET BY MOUTH EVERY DAY 30 MINUTES BEFORE EATING) 90 tablet 3  . LINZESS 145 MCG CAPS capsule Take 145 mcg by mouth daily.  3  . mirabegron ER (MYRBETRIQ) 50 MG TB24 tablet 1 tablet    . Multiple Vitamins-Minerals (MULTIVITAMIN WITH MINERALS) tablet Take 1 tablet by mouth daily.    Marland Kitchen nystatin (MYCOSTATIN/NYSTOP) powder Apply 1 application topically daily as needed (yeast infection).     . nystatin-triamcinolone (MYCOLOG II) cream Apply 1 application topically 2 (two) times daily. Apply to affected area BID for up to 7 days. (Patient taking differently: Apply 1 application topically 2 (two) times daily as needed (rash).) 60 g 0  . ondansetron (ZOFRAN) 4 MG tablet Take 4 mg by mouth every 8 (eight) hours as needed for nausea or vomiting.     Marland Kitchen OZEMPIC, 0.25 OR 0.5 MG/DOSE, 2 MG/1.5ML SOPN Inject 0.25 mg into the skin once a week. Sunday    . promethazine (PHENERGAN) 25 MG suppository Place 25 mg rectally every 6 (six) hours as needed for nausea or vomiting.     . thiamine (VITAMIN B-1) 50 MG tablet Take 50 mg by mouth daily.   0  . tiZANidine (ZANAFLEX) 4 MG tablet Take 2-4 mg by mouth at bedtime.   0  . UBRELVY 50 MG TABS Take 50 mg by mouth daily as needed (migraine).     . venlafaxine XR (EFFEXOR-XR) 150 MG 24 hr capsule Take 300 mg by mouth every morning.    .  WELLBUTRIN XL 300 MG 24 hr tablet Take 300 mg by mouth daily.   1  . zonisamide (ZONEGRAN) 100 MG capsule TAKE 2 CAPSULES BY MOUTH EVERY NIGHT AT BEDTIME. Take along w/ one of the 50mg  caps for total daily dose of 250mg     . zonisamide (ZONEGRAN) 50 MG capsule Take by mouth.    Marland Kitchen omeprazole (PRILOSEC) 20 MG capsule Take 20 mg by mouth daily as needed (heartburn).      No current facility-administered medications for this visit.     ALLERGIES: Naproxen, Monistat [miconazole], Sulfa antibiotics, Amoxicillin-pot clavulanate, and Tioconazole  Family History  Problem Relation Age of Onset  . Diabetes Mother   . Heart disease Father   . Social phobia Father   . Psychiatric Illness Father   . Hypertension Father   . Stroke Father   . Dementia Father   . Heart disease Other   . Diabetes Other   . Stroke Other   . Hypertension Maternal Grandmother   . Thyroid disease Maternal Grandmother   . Hypertension Paternal Grandmother   . Stroke Paternal Grandfather   . Breast cancer Neg Hx     Social History   Socioeconomic History  . Marital status: Divorced    Spouse name: Not on file  . Number of children: 1  . Years of education: College  . Highest education level: Not on file  Occupational History    Employer: [employer]  Tobacco Use  . Smoking status: Never Smoker  . Smokeless tobacco: Never Used  Vaping Use  . Vaping Use: Never used  Substance and Sexual Activity  . Alcohol use: Yes    Alcohol/week: 0.0 standard drinks    Comment: Once a month-rarely  . Drug use: No  . Sexual activity: Not Currently    Partners: Male  Birth control/protection: Surgical    Comment: tubal  Other Topics Concern  . Not on file  Social History Narrative   Pt lives at home with her family.   Caffeine Use: once daily   Social Determinants of Health   Financial Resource Strain: Not on file  Food Insecurity: Not on file  Transportation Needs: Not on file  Physical Activity: Not on  file  Stress: Not on file  Social Connections: Not on file  Intimate Partner Violence: Not on file    Review of Systems  All other systems reviewed and are negative.   PHYSICAL EXAMINATION:    BP 128/76   Pulse (!) 105   Ht 5\' 3"  (1.6 m)   Wt 196 lb (88.9 kg)   LMP 03/20/2019 (Approximate)   SpO2 96%   BMI 34.72 kg/m     General appearance: alert, cooperative and appears stated age  Abdomen: incisions intact, soft, non-tender, no masses,  no organomegaly   Pelvic: External genitalia:  no lesions              Urethra:  normal appearing urethra with no masses, tenderness or lesions              Bartholins and Skenes: normal                 Vagina: normal appearing vagina with normal color and discharge, no lesions.  Cuff intact. No suture noted.               Cervix: absent                Bimanual Exam:  Uterus:  absent              Adnexa: no mass, fullness, tenderness         Chaperone was present for exam.  ASSESSMENT  Status post laparoscopic hysterectomy.  Overactive bladder.  Depression.  On Wellbutrin.   PLAN  Ok to resume all normal activities.  Continue Myrbetriq 50 mg daily, #30, RF 3.   She will let me know if this needs a prior authorization.  She will contact her psychiatrist about her depression.  FU for annual exam in May, 2022 and prn.

## 2021-03-29 ENCOUNTER — Other Ambulatory Visit: Payer: Self-pay

## 2021-04-22 NOTE — Progress Notes (Deleted)
55 y.o. G1P1 Divorced Caucasian female here for annual exam.    PCP: Carol Ada, MD    Patient's last menstrual period was 03/20/2019 (approximate).           Sexually active: {yes no:314532}  The current method of family planning is tubal ligation/Hyst.    Exercising: {yes no:314532}  {types:19826} Smoker:  no  Health Maintenance: Pap:10-15-20 ASCUS:Pos HR HPV, 04/22/20 LGSIL:Pos HR HPV, 10-22-19 ASCUS:Pos HRHPV History of abnormal Pap:  Yes, 10-15-20 ASCUS:Pos HR HPV, 04-22-20 LGSIL:Pos HR HPV, 10-22-19 ASCUS:Pos HR HPV, 05-02-19 Neg:Neg HR HPV, 09-25-18 Neg:Neg HR HPV History of abnormal Pap: Yes, 10-22-19 ASCUS:Pos HR HPV--colpo LGSIL w/cervical stenosis & unable to assess endocervix--LEEP CIN I and ECC benign(showing minute atypical squamous fragment.04-17-18 LGSIL:Pos HR HPV--colpo LGSIL--LEEP was normal.  MMG:  ***04-01-20 3D/Lt.Br.poss.mass;Rt.Br.Neg--Lt.Br.Diag was Neg/density B/BiRads1 Colonoscopy: ***08-14-20 polyps;next 7 years BMD:   n/a  Result  n/a TDaP:  10-07-13 Gardasil:   no HIV: 04-22-19 NR Hep C: 04-22-19 Neg Screening Labs:  Hb today: ***, Urine today: ***   reports that she has never smoked. She has never used smokeless tobacco. She reports current alcohol use. She reports that she does not use drugs.  Past Medical History:  Diagnosis Date  . Abnormal Pap smear   . Anemia   . Anxiety   . Arthritis    neck  . Broken toe 12-22-15   middle toe right foot  . Cataract of both eyes   . Depression   . GERD (gastroesophageal reflux disease)   . Headache(784.0)   . Heart murmur   . History of hiatal hernia 2021   small  . History of kidney stones   . Hypothyroidism   . 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 Migraine   . Perforated bowel ([place])   . Pernicious anemia 06/19/2015  . PONV (postoperative nausea and vomiting)   . Thyroid disease    hypothyrodism    Past Surgical History:  Procedure Laterality Date  . CATARACT EXTRACTION,  BILATERAL Bilateral 02/2016  . CERVICAL BIOPSY  W/ LOOP ELECTRODE EXCISION  11/2013, 05/2018, 11/2019   LGSIL, negative for dysplasia, LGSIL and positive ECC with potential LGSIL  - respectively  . 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 CESAREAN SECTION  2008  . CHOLECYSTECTOMY    . COLONOSCOPY  10/2020  . CYSTOSCOPY N/A 10/26/2020   Procedure: CYSTOSCOPY;  Surgeon: Nunzio Cobbs, MD;  Location: [facility];  Service: Gynecology;  Laterality: N/A;  . GASTRIC BYPASS  2002  . HYSTEROSCOPY WITH D & C  10/14/2011   Procedure: DILATATION AND CURETTAGE (D&C) /HYSTEROSCOPY;  Surgeon: Arloa Koh;  Location: [facility];  Service: Gynecology;  Laterality: Bilateral;  . LASIK    . NOSE SURGERY    . STOMACH SURGERY  04/20/2015   dilation of "connection between stomach and intestine", [facility]  . TONSILLECTOMY    . TONSILLECTOMY    . TOTAL LAPAROSCOPIC HYSTERECTOMY WITH BILATERAL SALPINGO OOPHORECTOMY N/A 10/26/2020   Procedure: TOTAL LAPAROSCOPIC HYSTERECTOMY WITH BILATERAL SALPINGECTOMY;  Surgeon: Nunzio Cobbs, MD;  Location: [facility];  Service: Gynecology;  Laterality: N/A;  . TUBAL LIGATION  2008    Current Outpatient Medications  Medication Sig Dispense Refill  . acetaminophen (TYLENOL) 325 MG tablet 1 tablet as needed    . AIMOVIG 140 MG/ML SOAJ Inject 140 mg as directed every 30 (thirty) days.     Marland Kitchen  ALPRAZolam (XANAX) 1 MG tablet Take 1 mg by mouth 2 (two) times daily as needed. Anxiety    . Armodafinil 250 MG tablet Take 250 mg by mouth every morning.  2  . Botulinum Toxin Type A (BOTOX) 200 units SOLR INJECT UP TO 200 UNITS  INTRAMUSCULARLY TO HEAD,  NECK, AND SHOULDERS EVERY  12 WEEKS (GIVEN AT MD  OFFICE, DISCARD UNUSED)    . CONTRAVE 8-90 MG TB12 Take 2 tablets by mouth 2 (two) times daily.    Marland Kitchen COVID-19 mRNA vaccine, Pfizer, 30 MCG/0.3ML injection INJECT AS DIRECTED .3 mL 0  . cyanocobalamin  (,VITAMIN B-12,) 1000 MCG/ML injection Inject 1,000 mcg into the muscle every 21 ( twenty-one) days.     . cyclobenzaprine (FLEXERIL) 10 MG tablet 1 tablet as needed    . DEXILANT 60 MG capsule Take 60 mg by mouth daily.    Marland Kitchen dicyclomine (BENTYL) 10 MG capsule 1 capsule    . diphenhydrAMINE (BENADRYL) 12.5 MG/5ML elixir Take 12.5 mg by mouth every 6 (six) hours as needed for allergies.     Marland Kitchen divalproex (DEPAKOTE) 500 MG DR tablet Take 1 tablet (500 mg total) by mouth 2 (two) times daily. 180 tablet 4  . doxepin (SINEQUAN) 75 MG capsule Take 75 mg by mouth at bedtime.     Marland Kitchen eletriptan (RELPAX) 40 MG tablet Take 1 tablet (40 mg total) by mouth as needed for migraine or headache. May repeat in 2 hours if needed. Max 2 tabs per day or 8 tabs per month. 10 tablet 12  . ergocalciferol (VITAMIN D2) 50000 UNITS capsule Take 50,000 Units by mouth See admin instructions. She is taking daily M-F - pr pt    . Eszopiclone 3 MG TABS Take 3 mg by mouth at bedtime.   1  . famotidine (PEPCID) 20 MG tablet one tab    . fexofenadine (ALLEGRA) 180 MG tablet Take 180 mg by mouth daily as needed. allergies    . fluticasone (FLONASE) 50 MCG/ACT nasal spray Place 1 spray into both nostrils as needed for allergies.   1  . gabapentin (NEURONTIN) 300 MG capsule Take 300-900 mg by mouth See admin instructions. Taking 1 (300mg ) at night for 1 week, then 2 capsules (600mg ) for 2nd week, then 3 capsules (900mg ) at night until seen    . hydrOXYzine (ATARAX/VISTARIL) 25 MG tablet Take 25 mg by mouth every 8 (eight) hours as needed (headache).   2  . imipramine (TOFRANIL) 50 MG tablet Take 100 mg by mouth daily.   0  . lansoprazole (PREVACID) 30 MG capsule Take 30 mg by mouth daily as needed (acid reflux). Acid reflux    . Lasmiditan Succinate 100 MG TABS Take 100 mg by mouth as needed (headache onset).     Marland Kitchen levothyroxine (SYNTHROID) 50 MCG tablet TAKE 1 TABLET BY MOUTH EVERY DAY 30 MINUTES BEFORE EATING (Patient taking  differently: Take 50 mcg by mouth daily before breakfast. TAKE 1 TABLET BY MOUTH EVERY DAY 30 MINUTES BEFORE EATING) 90 tablet 3  . LINZESS 145 MCG CAPS capsule Take 145 mcg by mouth daily.  3  . mirabegron ER (MYRBETRIQ) 50 MG TB24 tablet Take one tablet (50 mg) by mouth daily. 30 tablet 3  . Multiple Vitamins-Minerals (MULTIVITAMIN WITH MINERALS) tablet Take 1 tablet by mouth daily.    Marland Kitchen nystatin (MYCOSTATIN/NYSTOP) powder Apply 1 application topically daily as needed (yeast infection).     . nystatin-triamcinolone (MYCOLOG II) cream Apply 1 application topically 2 (  two) times daily. Apply to affected area BID for up to 7 days. (Patient taking differently: Apply 1 application topically 2 (two) times daily as needed (rash).) 60 g 0  . omeprazole (PRILOSEC) 20 MG capsule Take 20 mg by mouth daily as needed (heartburn).     . ondansetron (ZOFRAN) 4 MG tablet Take 4 mg by mouth every 8 (eight) hours as needed for nausea or vomiting.     Marland Kitchen OZEMPIC, 0.25 OR 0.5 MG/DOSE, 2 MG/1.5ML SOPN Inject 0.25 mg into the skin once a week. Sunday    . promethazine (PHENERGAN) 25 MG suppository Place 25 mg rectally every 6 (six) hours as needed for nausea or vomiting.     . thiamine (VITAMIN B-1) 50 MG tablet Take 50 mg by mouth daily.   0  . tiZANidine (ZANAFLEX) 4 MG tablet Take 2-4 mg by mouth at bedtime.   0  . UBRELVY 50 MG TABS Take 50 mg by mouth daily as needed (migraine).     . venlafaxine XR (EFFEXOR-XR) 150 MG 24 hr capsule Take 300 mg by mouth every morning.    . WELLBUTRIN XL 300 MG 24 hr tablet Take 300 mg by mouth daily.   1  . zonisamide (ZONEGRAN) 100 MG capsule TAKE 2 CAPSULES BY MOUTH EVERY NIGHT AT BEDTIME. Take along w/ one of the 50mg  caps for total daily dose of 250mg     . zonisamide (ZONEGRAN) 50 MG capsule Take by mouth.     No current facility-administered medications for this visit.    Family History  Problem Relation Age of Onset  . Diabetes Mother   . Heart disease Father   .  Social phobia Father   . Psychiatric Illness Father   . Hypertension Father   . Stroke Father   . Dementia Father   . Heart disease Other   . Diabetes Other   . Stroke Other   . Hypertension Maternal Grandmother   . Thyroid disease Maternal Grandmother   . Hypertension Paternal Grandmother   . Stroke Paternal Grandfather   . Breast cancer Neg Hx     Review of Systems  Exam:   LMP 03/20/2019 (Approximate)     General appearance: alert, cooperative and appears stated age Head: normocephalic, without obvious abnormality, atraumatic Neck: no adenopathy, supple, symmetrical, trachea midline and thyroid normal to inspection and palpation Lungs: clear to auscultation bilaterally Breasts: normal appearance, no masses or tenderness, No nipple retraction or dimpling, No nipple discharge or bleeding, No axillary adenopathy Heart: regular rate and rhythm Abdomen: soft, non-tender; no masses, no organomegaly Extremities: extremities normal, atraumatic, no cyanosis or edema Skin: skin color, texture, turgor normal. No rashes or lesions Lymph nodes: cervical, supraclavicular, and axillary nodes normal. Neurologic: grossly normal  Pelvic: External genitalia:  no lesions              No abnormal inguinal nodes palpated.              Urethra:  normal appearing urethra with no masses, tenderness or lesions              Bartholins and Skenes: normal                 Vagina: normal appearing vagina with normal color and discharge, no lesions              Cervix: no lesions              Pap taken: {yes no:314532} Bimanual Exam:  Uterus:  normal size, contour, position, consistency, mobility, non-tender              Adnexa: no mass, fullness, tenderness              Rectal exam: {yes no:314532}.  Confirms.              Anus:  normal sphincter tone, no lesions  Chaperone was present for exam.  Assessment:   Well woman visit with normal exam.   Plan: Mammogram screening discussed. Self  breast awareness reviewed. Pap and HR HPV as above. Guidelines for Calcium, Vitamin D, regular exercise program including cardiovascular and weight bearing exercise.   Follow up annually and prn.   Additional counseling given.  {yes [id number]. _______ minutes face to face time of which over 50% was spent in counseling.    After visit summary provided.

## 2021-04-26 ENCOUNTER — Ambulatory Visit: Payer: 59 | Admitting: Obstetrics and Gynecology

## 2021-04-26 DIAGNOSIS — Z0289 Encounter for other administrative examinations: Secondary | ICD-10-CM

## 2021-05-14 IMAGING — US US ABDOMEN LIMITED
1 series · 14 of 16 positions shown · non-contrast
Comparison: CT abdomen pelvis dated May 29, 2018.

CLINICAL DATA: Abdominal wall pain near incision for recent TLH
BSO.

EXAM:
ULTRASOUND ABDOMEN LIMITED

[Series 1: us abdomen limited · 16 acquisitions, 14 frames shown]
[im 1/16]
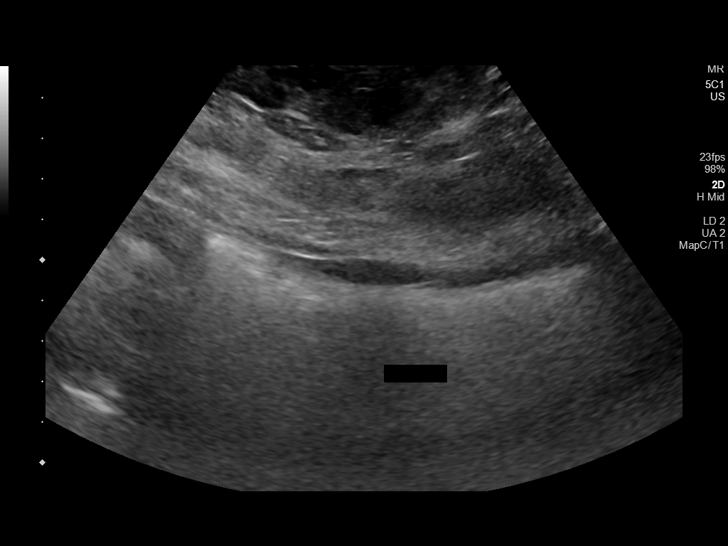
[im 2/16]
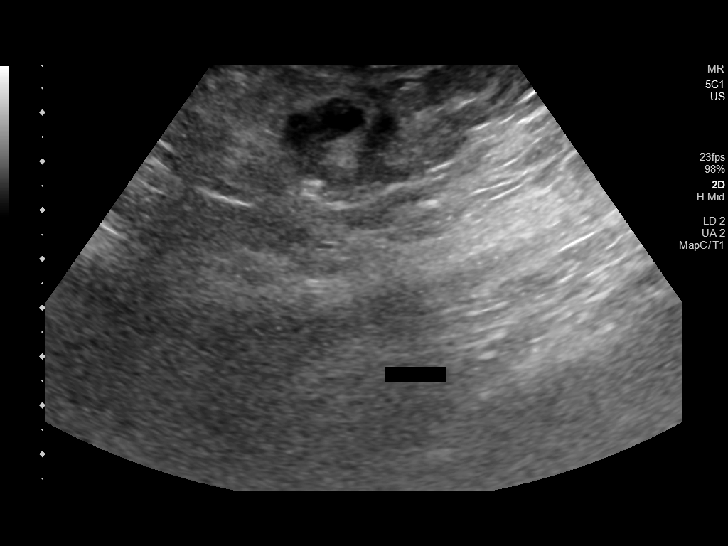
[im 3/16]
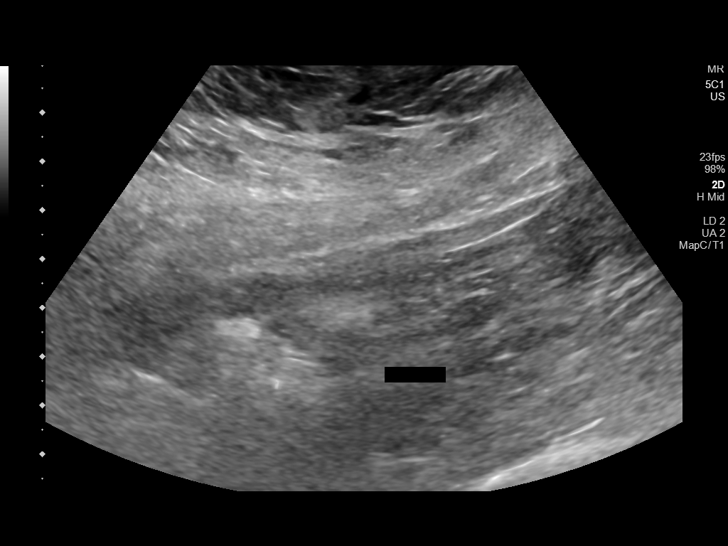
[im 5/16]
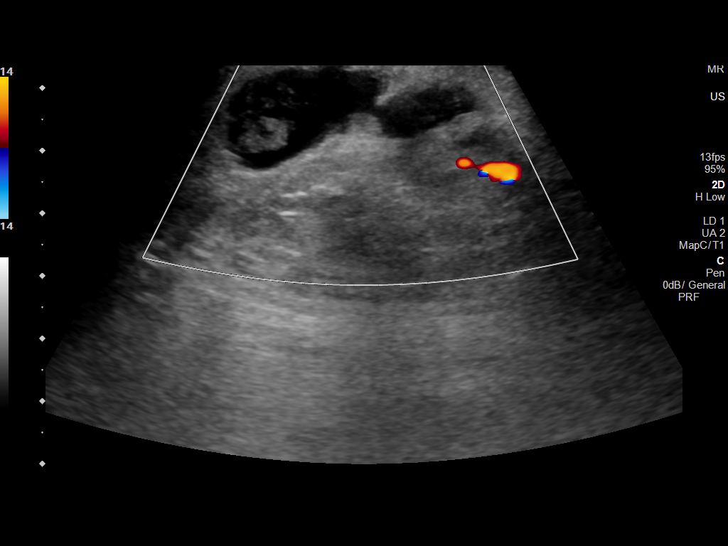
[im 6/16]
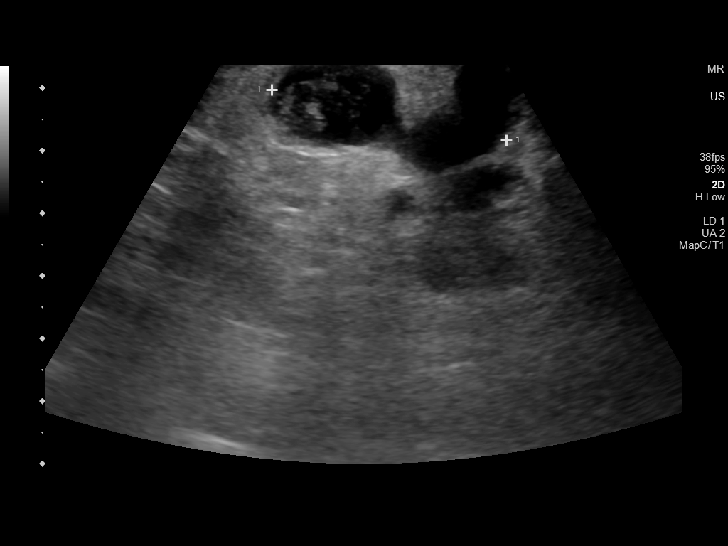
[im 7/16]
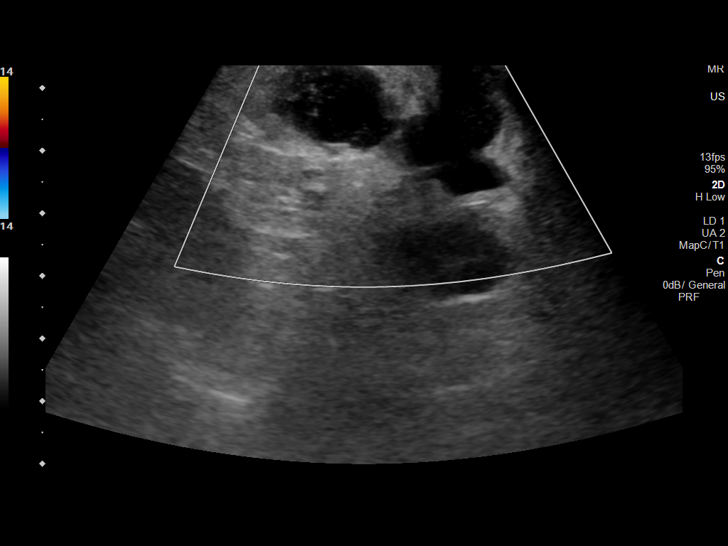
[im 8/16]
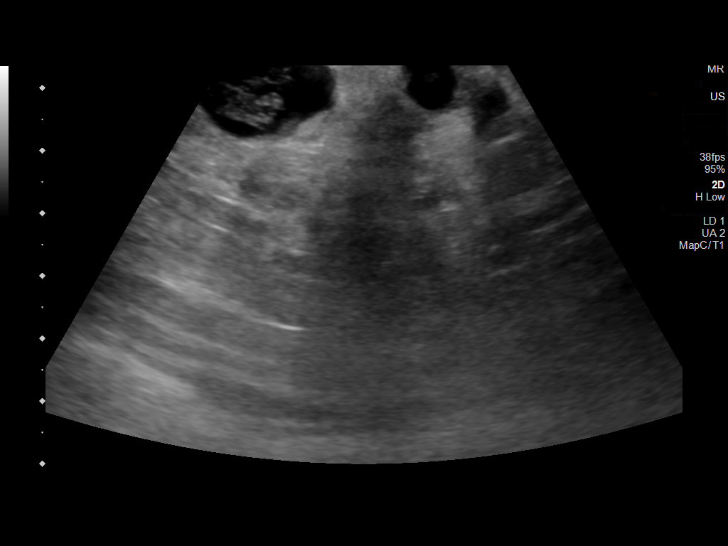
[im 9/16]
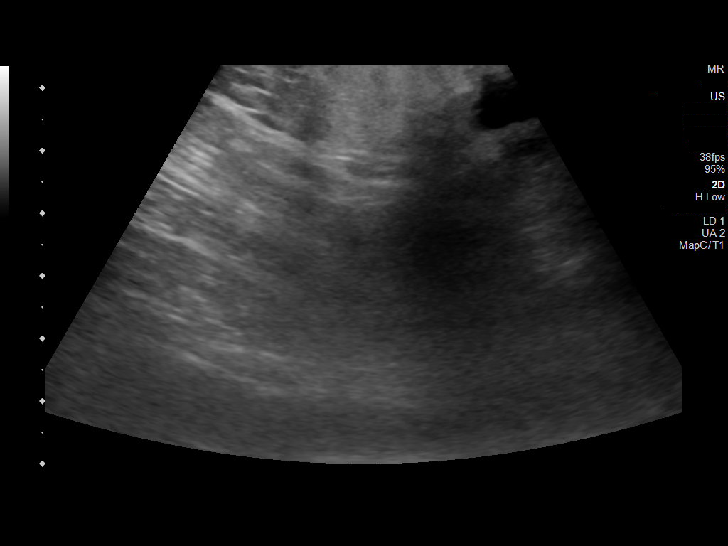
[im 10/16]
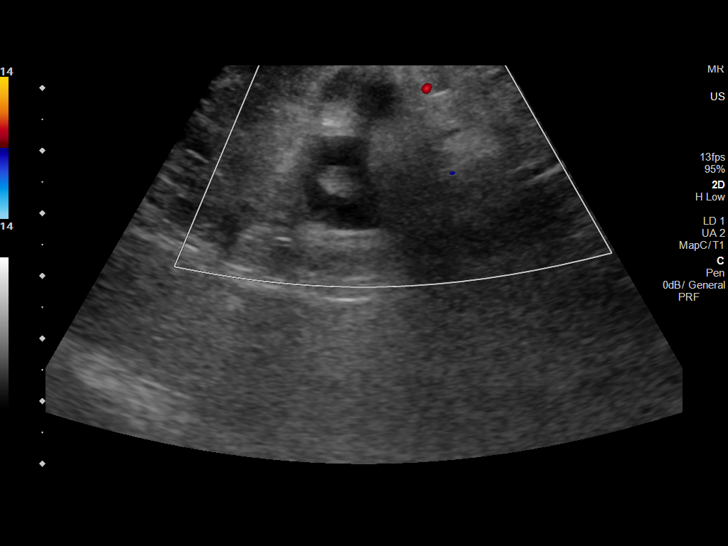
[im 11/16]
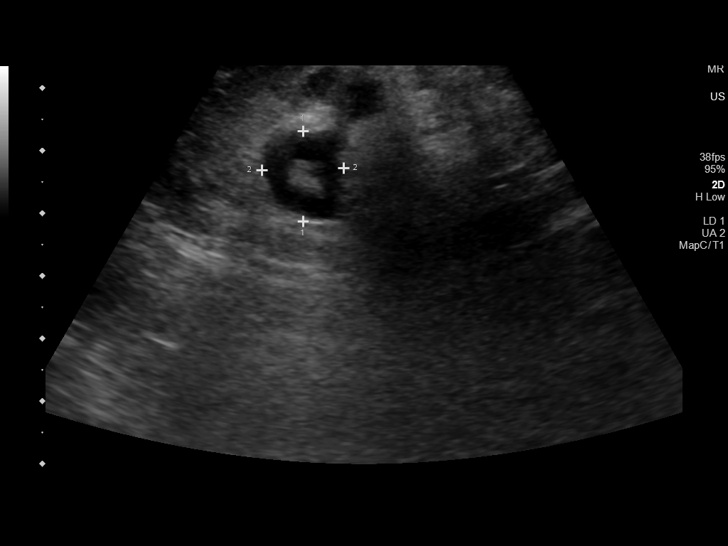
[im 13/16]
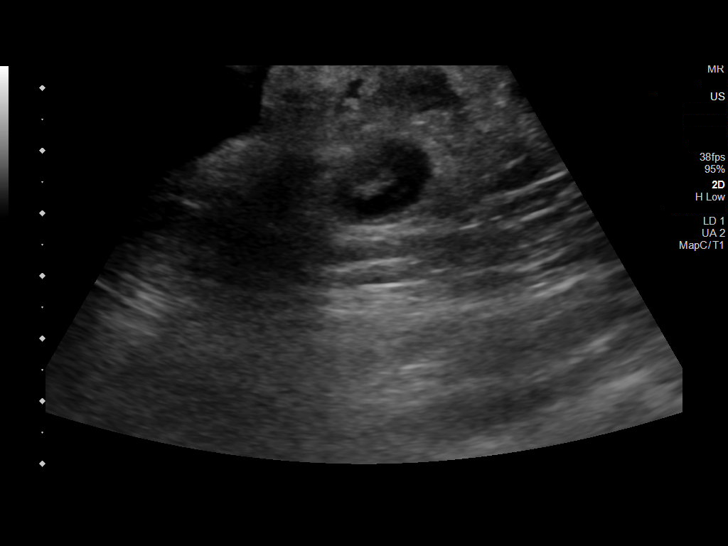
[im 14/16]
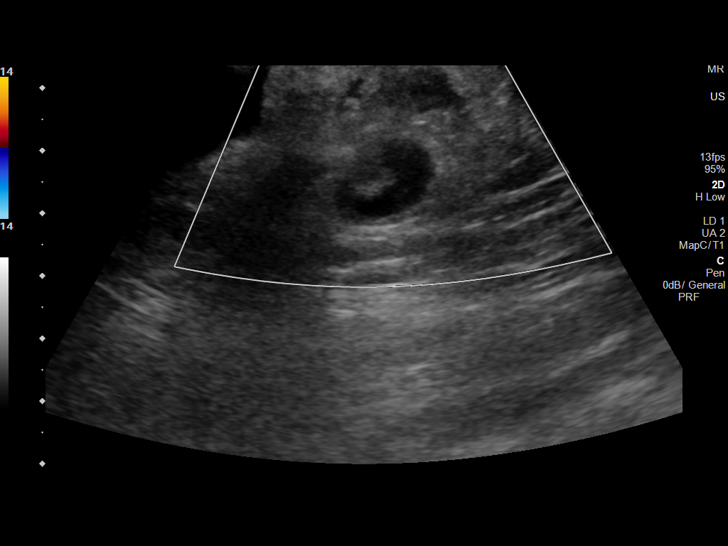
[im 15/16]
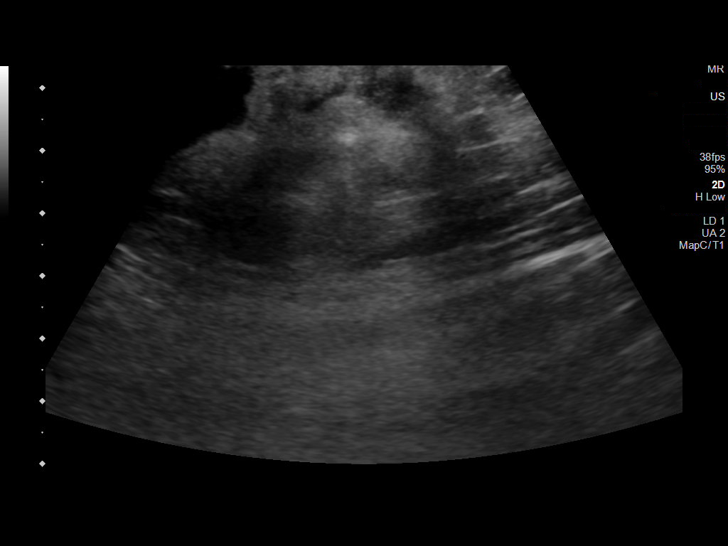
[im 16/16]
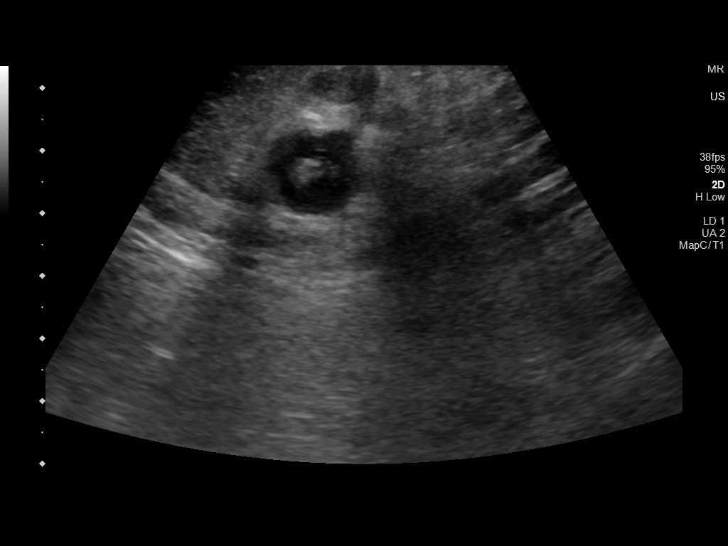

[14 of 16 positions shown; findings below may reference images not displayed]

FINDINGS: Focused ultrasound of the lower abdomen demonstrates two complex
heterogeneously hypoechoic and anechoic fluid collections in the
right lower quadrant abdominal wall at the incision. The larger
collection measures 4.3 x 1.6 x 3.8 cm and the smaller one measures
1.4 x 1.3 x 1.6 cm. There is no internal vascularity.
IMPRESSION: 1. Two complex fluid collections in the right lower quadrant
abdominal wall at the incision site. These could represent hematomas
versus abscesses. Correlate for any signs of infection.

## 2021-06-02 ENCOUNTER — Telehealth: Payer: Self-pay | Admitting: *Deleted

## 2021-06-02 NOTE — Telephone Encounter (Signed)
Patient call reports she has been having issues with mood swings and irritability. Had hysterectomy in Nov. 2021. I advised patient to schedule office visit with provider to discuss. Transferred to appointments to schedule.

## 2021-06-09 ENCOUNTER — Telehealth: Payer: Self-pay | Admitting: *Deleted

## 2021-06-09 NOTE — Telephone Encounter (Signed)
[facility] sent fax stating Myretriq 50 mg tablets is not covered under patient united [employer] insurance plan. The follow medications are listed on formulary list oxybutynin extended release, Sanctura, Vesicare, Detrol, and Toviaz.  I discussed this with patient as well so she is aware, Myretriq is not covered. She is willing to try whatever medication you feel is best for her. Please advise

## 2021-06-09 NOTE — Telephone Encounter (Signed)
Ok to stop Myrbetriq and start Ditropan XL 10 mg daily.  Disp:  30  RF:  1  Keep annual exam appointment with me for August. This timing should be good to assess her response to the Ditropan XL.

## 2021-06-10 MED ORDER — OXYBUTYNIN CHLORIDE ER 10 MG PO TB24: 10.0000 mg | ORAL_TABLET | Freq: Every day | ORAL | 1 refills | Status: DC

## 2021-06-10 NOTE — Addendum Note (Signed)
Addended by: Thamas Jaegers on: 06/10/2021 08:25 AM   Modules accepted: Orders

## 2021-06-10 NOTE — Telephone Encounter (Signed)
Patient informed with below note, Rx sent.

## 2021-07-27 NOTE — Progress Notes (Signed)
55 y.o. G1P1 Divorced Caucasian female here for annual exam.    Patient cannot advise me if her medication list is correct. Sometimes has hot flashes.   Feeling stressed out and not like herself.  She is not feeling like herself.  She is currently on medical therapy for depression.   Has vaginal dryness with intercourse.   Accepts STD screening.   Wants to continue medication for overactive bladder. She uses prn and not daily.   Needs refill of Nystatin powder.   PCP:   Jeremy Johann, MD  Patient's last menstrual period was 03/20/2019 (approximate).           Sexually active: Yes.    The current method of family planning is tubal ligation/Hyst.    Exercising: Yes.     walking Smoker:  no  Health Maintenance: Pap: 10-15-20 ASCUS:Pos HR HPV, 04-22-20 LGSIL:Pos HR HPV, 10-22-19 ASCUS:Pos HR HPV History of abnormal Pap:  yes, 10-15-20 ASCUS:Pos HR HPV, 04-22-20 LGSIL:Pos HR HPV, 10-22-19 ASCUS:Pos HR HPV--colpo LGSIL w/cervical stenosis & unable to assess endocervix--LEEP CIN I and ECC benign(showing minute atypical squamous fragment.04-17-18 LGSIL:Pos HR HPV--colpo LGSIL--LEEP was normal. 2014 LEEP CIN MMG:  04-01-20 3D/Lt.Br.poss.mass;Rt.Br.Neg/Diag.Lt.Br.Neg/BiRads1--PT.knows she needs to schedule Colonoscopy: ~2015 normal per patient;Pt unsure when next one due.  Had 2 colonoscopies last year.  BMD: 10-19-17  Result :Normal TDaP: 10-07-13 Gardasil:   no HIV:04-22-19 NR Hep C:04-22-19 Neg Screening Labs:  PCP.   reports that she has never smoked. She has never used smokeless tobacco. She reports current alcohol use. She reports that she does not use drugs.  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   Migraine    Perforated bowel ([place])    Pernicious anemia 06/19/2015   PONV (postoperative nausea and vomiting)    Thyroid disease    hypothyrodism    Past Surgical History:  Procedure Laterality Date   CATARACT EXTRACTION, BILATERAL Bilateral 02/2016   CERVICAL BIOPSY  W/ LOOP ELECTRODE EXCISION  11/2013, 05/2018, 11/2019   LGSIL, negative for dysplasia, LGSIL and positive ECC with potential LGSIL  - respectively   CERVIX LESION DESTRUCTION  1993   CIN III, [facility], recurrence--YAG laser   CESAREAN SECTION  2008   CHOLECYSTECTOMY     COLONOSCOPY  10/2020   CYSTOSCOPY N/A 10/26/2020   Procedure: CYSTOSCOPY;  Surgeon: Nunzio Cobbs, MD;  Location: [facility];  Service: Gynecology;  Laterality: N/A;   GASTRIC BYPASS  2002   HYSTEROSCOPY WITH D & C  10/14/2011   Procedure: DILATATION AND CURETTAGE (D&C) /HYSTEROSCOPY;  Surgeon: Arloa Koh;  Location: [facility];  Service: Gynecology;  Laterality: Bilateral;   LASIK     NOSE SURGERY     STOMACH SURGERY  04/20/2015   dilation of "connection between stomach and intestine", [facility]   TONSILLECTOMY     TONSILLECTOMY     TOTAL LAPAROSCOPIC HYSTERECTOMY WITH BILATERAL SALPINGO OOPHORECTOMY N/A 10/26/2020   Procedure: TOTAL LAPAROSCOPIC HYSTERECTOMY WITH BILATERAL SALPINGECTOMY;  Surgeon: Nunzio Cobbs, MD;  Location: [facility];  Service: Gynecology;  Laterality: N/A;   TUBAL LIGATION  2008    Current Outpatient Medications  Medication Sig Dispense Refill   acetaminophen (TYLENOL) 325 MG tablet 1 tablet as needed     AIMOVIG 140 MG/ML SOAJ Inject 140 mg as directed every 30 (thirty) days.      ALPRAZolam (XANAX) 1 MG tablet Take 1 mg by mouth 2 (two) times daily as needed. Anxiety     Armodafinil 250 MG tablet Take 250 mg by mouth every morning.  2   Botulinum Toxin Type A (BOTOX) 200 units SOLR  INJECT UP TO 200 UNITS  INTRAMUSCULARLY TO HEAD,  NECK, AND SHOULDERS EVERY  12 WEEKS (GIVEN AT MD  OFFICE, DISCARD UNUSED)     CONTRAVE 8-90 MG TB12 Take 2 tablets by mouth 2 (two) times daily.     cyanocobalamin (,VITAMIN B-12,) 1000 MCG/ML injection Inject 1,000 mcg into the muscle every 21 ( twenty-one) days.      cyclobenzaprine (FLEXERIL) 10 MG tablet 1 tablet as needed     DEXILANT 60 MG capsule Take 60 mg by mouth daily.     dicyclomine (BENTYL) 10 MG capsule 1 capsule     diphenhydrAMINE (BENADRYL) 12.5 MG/5ML elixir Take 12.5 mg by mouth every 6 (six) hours as needed for allergies.      divalproex (DEPAKOTE) 500 MG DR tablet Take 1 tablet (500 mg total) by mouth 2 (two) times daily. 180 tablet 4   doxepin (SINEQUAN) 75 MG capsule Take 75 mg by mouth at bedtime.      eletriptan (RELPAX) 40 MG tablet Take 1 tablet (40 mg total) by mouth as needed for migraine or headache. May repeat in 2 hours if needed. Max 2 tabs per day or 8 tabs per month. 10 tablet 12   ergocalciferol (VITAMIN D2) 50000 UNITS capsule Take 50,000 Units by mouth See admin instructions. She is taking daily M-F - pr pt     Eszopiclone 3 MG TABS Take 3 mg by mouth at bedtime.   1   famotidine (PEPCID) 20 MG tablet one tab     fexofenadine (ALLEGRA) 180 MG tablet Take 180 mg by mouth daily as needed. allergies     fluticasone (FLONASE) 50 MCG/ACT nasal spray Place 1 spray into both nostrils as needed for allergies.   1   gabapentin (NEURONTIN) 300 MG capsule Take 300-900 mg by mouth See admin instructions. Taking 1 ('300mg'$ ) at night for 1 week, then 2 capsules ('600mg'$ ) for 2nd week, then 3 capsules ('900mg'$ ) at night until seen     hydrOXYzine (ATARAX/VISTARIL) 25 MG tablet Take 25 mg by mouth every 8 (eight) hours as needed (headache).   2   imipramine (TOFRANIL) 50 MG tablet Take 100 mg by mouth daily.   0   lansoprazole (PREVACID) 30 MG capsule Take 30 mg by mouth daily as needed (acid reflux). Acid reflux     Lasmiditan  Succinate 100 MG TABS Take 100 mg by mouth as needed (headache onset).      levothyroxine (SYNTHROID) 50 MCG tablet TAKE 1 TABLET BY MOUTH EVERY DAY 30 MINUTES BEFORE EATING (Patient taking differently: Take 50 mcg by mouth daily before breakfast. TAKE 1 TABLET BY MOUTH EVERY DAY 30 MINUTES BEFORE EATING) 90 tablet 3   LINZESS 145 MCG CAPS capsule Take 145 mcg by mouth daily.  3   Multiple Vitamins-Minerals (MULTIVITAMIN WITH MINERALS) tablet Take 1  tablet by mouth daily.     nystatin (MYCOSTATIN/NYSTOP) powder Apply 1 application topically daily as needed (yeast infection). 30 g 2   nystatin-triamcinolone (MYCOLOG II) cream Apply 1 application topically 2 (two) times daily. Apply to affected area BID for up to 7 days. (Patient taking differently: Apply 1 application topically 2 (two) times daily as needed (rash).) 60 g 0   omeprazole (PRILOSEC) 20 MG capsule Take 20 mg by mouth daily as needed (heartburn).      ondansetron (ZOFRAN) 4 MG tablet Take 4 mg by mouth every 8 (eight) hours as needed for nausea or vomiting.      promethazine (PHENERGAN) 25 MG suppository Place 25 mg rectally every 6 (six) hours as needed for nausea or vomiting.      thiamine (VITAMIN B-1) 50 MG tablet Take 50 mg by mouth daily.   0   tiZANidine (ZANAFLEX) 4 MG tablet Take 2-4 mg by mouth at bedtime.   0   UBRELVY 50 MG TABS Take 50 mg by mouth daily as needed (migraine).      venlafaxine XR (EFFEXOR-XR) 150 MG 24 hr capsule Take 300 mg by mouth every morning.     WELLBUTRIN XL 300 MG 24 hr tablet Take 300 mg by mouth daily.   1   zonisamide (ZONEGRAN) 100 MG capsule TAKE 2 CAPSULES BY MOUTH EVERY NIGHT AT BEDTIME. Take along w/ one of the '50mg'$  caps for total daily dose of '250mg'$      No current facility-administered medications for this visit.    Family History  Problem Relation Age of Onset   Diabetes Mother    Heart disease Father    Social phobia Father    Psychiatric Illness Father    Hypertension Father     Stroke Father    Dementia Father    Heart disease Other    Diabetes Other    Stroke Other    Hypertension Maternal Grandmother    Thyroid disease Maternal Grandmother    Hypertension Paternal Grandmother    Stroke Paternal Grandfather    Breast cancer Neg Hx     Review of Systems  All other systems reviewed and are negative.  Exam:   BP 116/78   Pulse 86   Ht 5' 2.5" (1.588 m)   Wt 187 lb (84.8 kg)   LMP 03/20/2019 (Approximate)   SpO2 99%   BMI 33.66 kg/m     General appearance: alert, cooperative and appears stated age Head: normocephalic, without obvious abnormality, atraumatic Neck: no adenopathy, supple, symmetrical, trachea midline and thyroid, left side more prominent than right side? Lungs: clear to auscultation bilaterally Breasts: normal appearance, no masses or tenderness, No nipple retraction or dimpling, No nipple discharge or bleeding, No axillary adenopathy Heart: regular rate and rhythm Abdomen: soft, non-tender; no masses, no organomegaly Extremities: extremities normal, atraumatic, no cyanosis or edema Skin: skin color, texture, turgor normal. No rashes or lesions Lymph nodes: cervical, supraclavicular, and axillary nodes normal. Neurologic: grossly normal  Pelvic: External genitalia:  no lesions              No abnormal inguinal nodes palpated.              Urethra:  normal appearing urethra with no masses, tenderness or lesions              Bartholins and Skenes: normal                 Vagina: normal appearing vagina with normal  color and discharge, no lesions              Cervix: absent              Pap taken: yes. Bimanual Exam:  Uterus:  absent              Adnexa: no mass, fullness, tenderness              Rectal exam: yes.  Confirms.              Anus:  normal sphincter tone, no lesions  Chaperone was present for exam:  Estill Bamberg, CMA  Assessment:   Well woman visit with gynecologic exam. Status post laparoscopic hysterectomy, bilateral  salpingectomy, cystoscopy.  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  Overactive bladder.  Urinary urgency. Hypothyroidism.   Plan: Mammogram screening discussed.  She will need to update this.  Self breast awareness reviewed. Pap and HR HPV collected. Guidelines for Calcium, Vitamin D, regular exercise program including cardiovascular and weight bearing exercise. Pap, HR HPV screening, GC/CT/trichomonas testing from pap. FSH, E2, and serum STD screening.  Will do estrogen after mammogram done.  I discussed potential risk of stroke, DVT, and PE. Cooking oil for vaginal hydration.  Rx for Nystatin powder. Urinalysis:  sg 1.023, pH 5.5, neg nitrites, 0 - 5 WBC, NS RBC, 10 - 20 squams, mod bacteria, mod mucous.  Will refill Ditropan XL 10 mg daily as Myrbetriq is not covered by her insurance. She will see her endocrinologist for continued management of her thyroid.  Follow up annually and prn.   After visit summary provided.

## 2021-07-28 ENCOUNTER — Other Ambulatory Visit: Payer: Self-pay

## 2021-07-28 ENCOUNTER — Encounter: Payer: Self-pay | Admitting: Obstetrics and Gynecology

## 2021-07-28 ENCOUNTER — Other Ambulatory Visit (HOSPITAL_COMMUNITY)
Admission: RE | Admit: 2021-07-28 | Discharge: 2021-07-28 | Disposition: A | Discharge status: Home / Self Care | Payer: 59 | Source: Ambulatory Visit | Attending: Obstetrics and Gynecology | Admitting: Obstetrics and Gynecology

## 2021-07-28 ENCOUNTER — Other Ambulatory Visit: Payer: Self-pay | Admitting: Obstetrics and Gynecology

## 2021-07-28 ENCOUNTER — Ambulatory Visit (INDEPENDENT_AMBULATORY_CARE_PROVIDER_SITE_OTHER): Payer: 59 | Admitting: Obstetrics and Gynecology

## 2021-07-28 VITALS — BP 116/78 | HR 86 | Ht 62.5 in | Wt 187.0 lb

## 2021-07-28 DIAGNOSIS — Z01419 Encounter for gynecological examination (general) (routine) without abnormal findings: Secondary | ICD-10-CM

## 2021-07-28 DIAGNOSIS — N951 Menopausal and female climacteric states: Secondary | ICD-10-CM | POA: Diagnosis not present

## 2021-07-28 DIAGNOSIS — R3915 Urgency of urination: Secondary | ICD-10-CM | POA: Diagnosis not present

## 2021-07-28 DIAGNOSIS — Z1231 Encounter for screening mammogram for malignant neoplasm of breast: Secondary | ICD-10-CM

## 2021-07-28 DIAGNOSIS — Z113 Encounter for screening for infections with a predominantly sexual mode of transmission: Secondary | ICD-10-CM

## 2021-07-28 DIAGNOSIS — Z87410 Personal history of cervical dysplasia: Secondary | ICD-10-CM

## 2021-07-28 MED ORDER — NYSTATIN 100000 UNIT/GM EX POWD: 1.0000 "application " | Freq: Every day | CUTANEOUS | 2 refills | Status: AC | PRN

## 2021-07-28 NOTE — Patient Instructions (Signed)

## 2021-07-29 ENCOUNTER — Encounter: Payer: Self-pay | Admitting: Obstetrics and Gynecology

## 2021-07-29 MED ORDER — OXYBUTYNIN CHLORIDE ER 10 MG PO TB24: 10.0000 mg | ORAL_TABLET | Freq: Every day | ORAL | 11 refills | Status: DC

## 2021-07-29 NOTE — Telephone Encounter (Signed)
Spoke with pt over phone she is aware we will contact her once Dr. Quincy Simmonds reads the results

## 2021-07-30 ENCOUNTER — Ambulatory Visit
Admission: RE | Admit: 2021-07-30 | Discharge: 2021-07-30 | Disposition: A | Discharge status: Home / Self Care | Payer: 59 | Source: Ambulatory Visit | Attending: Obstetrics and Gynecology | Admitting: Obstetrics and Gynecology

## 2021-07-30 ENCOUNTER — Other Ambulatory Visit: Payer: Self-pay

## 2021-07-30 DIAGNOSIS — Z1231 Encounter for screening mammogram for malignant neoplasm of breast: Secondary | ICD-10-CM

## 2021-07-30 LAB — URINALYSIS, COMPLETE W/RFL CULTURE
Glucose, UA: NEGATIVE
Hgb urine dipstick: NEGATIVE
Hyaline Cast: NONE SEEN /LPF
Leukocyte Esterase: NEGATIVE
Nitrites, Initial: NEGATIVE
Protein, ur: NEGATIVE
RBC / HPF: NONE SEEN /HPF (ref 0–2)
Specific Gravity, Urine: 1.023 (ref 1.001–1.035)
pH: 5.5 (ref 5.0–8.0)

## 2021-07-30 LAB — URINE CULTURE
MICRO NUMBER:: 12285325
SPECIMEN QUALITY:: ADEQUATE

## 2021-07-30 LAB — CULTURE INDICATED

## 2021-08-03 ENCOUNTER — Encounter: Payer: Self-pay | Admitting: Obstetrics and Gynecology

## 2021-08-03 LAB — COMPLETE METABOLIC PANEL WITH GFR

## 2021-08-03 LAB — HEPATITIS C ANTIBODY
Hepatitis C Ab: NONREACTIVE
SIGNAL TO CUT-OFF: 0.01 (ref <1.00)

## 2021-08-03 LAB — CYTOLOGY - PAP
Chlamydia: NEGATIVE
Comment: NEGATIVE
Comment: NEGATIVE
Comment: NEGATIVE
Comment: NORMAL
Diagnosis: NEGATIVE
High risk HPV: POSITIVE — AB
Neisseria Gonorrhea: NEGATIVE
Trichomonas: NEGATIVE

## 2021-08-03 LAB — HIV ANTIBODY (ROUTINE TESTING W REFLEX): HIV 1&2 Ab, 4th Generation: NONREACTIVE

## 2021-08-03 LAB — HEPATITIS B SURFACE ANTIGEN: Hepatitis B Surface Ag: NONREACTIVE

## 2021-08-03 LAB — ESTRADIOL: Estradiol: 23 pg/mL

## 2021-08-03 LAB — RPR: RPR Ser Ql: NONREACTIVE

## 2021-08-03 LAB — FOLLICLE STIMULATING HORMONE: FSH: 90.7 m[IU]/mL

## 2021-08-04 ENCOUNTER — Other Ambulatory Visit: Payer: Self-pay | Admitting: *Deleted

## 2021-08-04 ENCOUNTER — Other Ambulatory Visit: Payer: Self-pay

## 2021-08-04 ENCOUNTER — Other Ambulatory Visit: Payer: 59

## 2021-08-04 DIAGNOSIS — Z01419 Encounter for gynecological examination (general) (routine) without abnormal findings: Secondary | ICD-10-CM

## 2021-08-04 DIAGNOSIS — R822 Biliuria: Secondary | ICD-10-CM

## 2021-08-04 NOTE — Addendum Note (Signed)
Addended by: Burnice Logan on: 08/04/2021 10:32 AM   Modules accepted: Orders

## 2021-08-05 ENCOUNTER — Telehealth: Payer: Self-pay

## 2021-08-05 ENCOUNTER — Other Ambulatory Visit: Payer: Self-pay

## 2021-08-05 DIAGNOSIS — R87810 Cervical high risk human papillomavirus (HPV) DNA test positive: Secondary | ICD-10-CM

## 2021-08-05 NOTE — Telephone Encounter (Signed)
Patient called because she had mammo last Friday. She said Dr. Quincy Simmonds had talked with her about prescribing the estrogen patch for her if mammo normal.  Mammo result is in chart now and is negative.

## 2021-08-05 NOTE — Telephone Encounter (Signed)
Patient called because she had mammo last Friday. She said Dr. Quincy Simmonds had talked with her about prescribing the estrogen patch for her if mammo normal.  I advised patient mammo report is still pending. Once report is final I will send this message to Dr. Quincy Simmonds.

## 2021-08-05 NOTE — Telephone Encounter (Signed)
Opened in error

## 2021-08-06 ENCOUNTER — Other Ambulatory Visit: Payer: Self-pay | Admitting: Obstetrics and Gynecology

## 2021-08-06 NOTE — Telephone Encounter (Signed)
Please check on the status of her complete metabolic profile.  There were multiple repeat orders for CMP and LFTs. I tried to cancel the duplicate orders.  I need to see the result of her CMP first before prescribing medication.

## 2021-08-06 NOTE — Telephone Encounter (Signed)
Patient has a lab appointment  08/11/2021. Per lab there was a issue. So pt must come in for a re-draw.Patient aware

## 2021-08-10 NOTE — Telephone Encounter (Signed)
Encounter reviewed and closed.  

## 2021-08-11 ENCOUNTER — Other Ambulatory Visit: Payer: 59

## 2021-08-11 ENCOUNTER — Other Ambulatory Visit: Payer: Self-pay

## 2021-08-11 DIAGNOSIS — R822 Biliuria: Secondary | ICD-10-CM

## 2021-08-11 DIAGNOSIS — Z01419 Encounter for gynecological examination (general) (routine) without abnormal findings: Secondary | ICD-10-CM

## 2021-08-12 LAB — COMPLETE METABOLIC PANEL WITH GFR
AG Ratio: 1.7 (calc) (ref 1.0–2.5)
ALT: 7 U/L (ref 6–29)
AST: 13 U/L (ref 10–35)
Albumin: 4.3 g/dL (ref 3.6–5.1)
Alkaline phosphatase (APISO): 61 U/L (ref 37–153)
BUN: 14 mg/dL (ref 7–25)
CO2: 29 mmol/L (ref 20–32)
Calcium: 9.5 mg/dL (ref 8.6–10.4)
Chloride: 100 mmol/L (ref 98–110)
Creat: 0.92 mg/dL (ref 0.50–1.03)
Globulin: 2.6 g/dL (calc) (ref 1.9–3.7)
Glucose, Bld: 107 mg/dL — ABNORMAL HIGH (ref 65–99)
Potassium: 4.1 mmol/L (ref 3.5–5.3)
Sodium: 138 mmol/L (ref 135–146)
Total Bilirubin: 0.5 mg/dL (ref 0.2–1.2)
Total Protein: 6.9 g/dL (ref 6.1–8.1)
eGFR: 75 mL/min/{1.73_m2} (ref >=60)

## 2021-08-12 LAB — HEPATIC FUNCTION PANEL
AG Ratio: 1.7 (calc) (ref 1.0–2.5)
ALT: 7 U/L (ref 6–29)
AST: 13 U/L (ref 10–35)
Albumin: 4.3 g/dL (ref 3.6–5.1)
Alkaline phosphatase (APISO): 61 U/L (ref 37–153)
Bilirubin, Direct: 0.1 mg/dL (ref 0.0–0.2)
Globulin: 2.6 g/dL (calc) (ref 1.9–3.7)
Indirect Bilirubin: 0.4 mg/dL (calc) (ref 0.2–1.2)
Total Bilirubin: 0.5 mg/dL (ref 0.2–1.2)
Total Protein: 6.9 g/dL (ref 6.1–8.1)

## 2021-08-17 ENCOUNTER — Telehealth: Payer: Self-pay

## 2021-08-17 NOTE — Telephone Encounter (Signed)
Ok for Vivelle Dot 0.05 mg to skin of lower abdomen twice weekly.  Disp:  8 RF:  2  We can assess her response to the estrogen at her colposcopy appointment.   I am sorry for her frustration in waiting to receiving a prescription for the estrogen.  There is no way to link her mammogram result with a previous request for medication.

## 2021-08-17 NOTE — Telephone Encounter (Signed)
MMG 07/30/21 at [facility], neg.   Dr. Quincy Simmonds -please advise on HRT.

## 2021-08-17 NOTE — Telephone Encounter (Signed)
Spoke with patient regarding benefits for scheduled Colposcopy. Patient acknowledges understanding of information presented.  Patient expresses frustration regarding hormone therapy. Patient stated that she was told that she must have a mammogram before anything can be prescribed. Patient stated that she has had her mammogram and has not heard anything back regarding hormonal therapy.  Routing to Dr. Quincy Simmonds and Glorianne Manchester, RN to assist patient.

## 2021-08-18 MED ORDER — ESTRADIOL 0.05 MG/24HR TD PTTW: 1.0000 | MEDICATED_PATCH | TRANSDERMAL | 2 refills | Status: DC

## 2021-08-18 NOTE — Telephone Encounter (Signed)
Spoke with patient. Advised as seen below per Dr. Quincy Simmonds. Rx sent to verified pharmacy. Patient apologetic regarding frustration expressed during call on 9/13. Patient verbalizes understanding and thankful for follow-up call.   Colpo is scheduled for 09/22/21 at 0930.   Routing to provider for final review. Patient is agreeable to disposition. Will close encounter.  Cc: Hayley Carder

## 2021-09-09 NOTE — Progress Notes (Signed)
  Subjective:     Patient ID: Megan Davila, female   DOB: 1966-10-28, 55 y.o.   MRN: [id number]  HPI Patient scheduled for colposcopy with pap 07-28-21 Neg:Pos HR HPV.  Pap History:  07-28-21 Neg:Pos HR HPV 10-15-20 ASCUS:Pos HR HPV, 04-22-20 LGSIL:Pos HR HPV, 10-22-19 ASCUS:Pos HR HPV History of abnormal Pap:  yes, 10-15-20 ASCUS:Pos HR HPV, 04-22-20 LGSIL:Pos HR HPV, 10-22-19 ASCUS:Pos HR HPV--colpo LGSIL w/cervical stenosis & unable to assess endocervix--LEEP CIN I and ECC benign(showing minute atypical squamous fragment.04-17-18 LGSIL:Pos HR HPV--colpo LGSIL--LEEP was normal. 2014 LEEP CIN   Review of Systems  All other systems reviewed and are negative.  LMP: Hyst Contraception: Tubal/Hyst      Objective:   Physical Exam Colposcopy of the vagina.  Consent for procedure.  Cervix absent.  Acetic acid placed inside vagina.  No acetowhite lesions noted.  Lugol's placed.  No decreased uptake noted.  No biopsy done.    Assessment:        Status post laparoscopic hysterectomy, bilateral salpingectomy, cystoscopy.  Final pathology CIN I.    Recurrent dysplasia of cervix.    Current pap normal with positive HR HPV.   Normal colposcopy of the vagina today. Plan:     Reassurance given.  Fu in one year for annual exam with pap and HR HPV testing.

## 2021-09-13 ENCOUNTER — Other Ambulatory Visit: Payer: Self-pay

## 2021-09-13 ENCOUNTER — Encounter: Payer: Self-pay | Admitting: Obstetrics and Gynecology

## 2021-09-13 ENCOUNTER — Ambulatory Visit (INDEPENDENT_AMBULATORY_CARE_PROVIDER_SITE_OTHER): Payer: 59 | Admitting: Obstetrics and Gynecology

## 2021-09-13 DIAGNOSIS — R87810 Cervical high risk human papillomavirus (HPV) DNA test positive: Secondary | ICD-10-CM

## 2021-09-22 ENCOUNTER — Ambulatory Visit: Payer: 59 | Admitting: Obstetrics and Gynecology

## 2021-09-29 ENCOUNTER — Telehealth: Payer: Self-pay | Admitting: *Deleted

## 2021-09-29 NOTE — Telephone Encounter (Signed)
[employer] member services team  and patient called with questions pertaining to bill for the Colpo. Please call [phone]. Pt request cancellation and a call back pertaining to the  co pay that was paid today. I will route billing for assistance.

## 2021-09-29 NOTE — Telephone Encounter (Signed)
Spoke with patient regarding Colpo. Encounter closed.

## 2021-11-08 ENCOUNTER — Other Ambulatory Visit: Payer: Self-pay | Admitting: Obstetrics and Gynecology

## 2021-11-10 ENCOUNTER — Other Ambulatory Visit: Payer: Self-pay

## 2021-11-10 ENCOUNTER — Other Ambulatory Visit: Payer: Self-pay | Admitting: Obstetrics and Gynecology

## 2021-11-10 MED ORDER — LEVOTHYROXINE SODIUM 50 MCG PO TABS: ORAL_TABLET | ORAL | 0 refills | Status: DC

## 2021-11-10 NOTE — Telephone Encounter (Signed)
Please contact patient in follow up to request to refill her Synthroid.  I would like to have endocrinology follow this to be sure that one provider is prescribing and ordering her blood work to follow this.   Please let me know if this is a concern for her.

## 2021-11-10 NOTE — Telephone Encounter (Signed)
I spoke with patient and relayed Dr. Elza Rafter recommendation. Patient is agreeable to this but said it has been a very long time since she has seen Dr. Chalmers Cater and she knows it will take a long time to get in. She asked if Dr. Quincy Simmonds will refill Synthroid for her one more time until she can get visit. She is getting ready to go out of town.

## 2021-11-10 NOTE — Telephone Encounter (Signed)
Refill request received from [facility] for Myrbetriq 50mg  tabs. PA required.  Per review of previous AEX 07/28/21, Will refill Ditropan XL 10 mg daily as Myrbetriq is not covered by her insurance.   Call placed to patient. Confirmed medication that patient is currently taking. Patient states she has Ditropoan, but does not take daily, only takes when she feels she needs it, is unsure that this is working. Patient is going to follow-up with pharmacy for refill of Ditropana and take medication as prescribed. Patient will call if no change. States she will be traveling for a wedding and does not want to experience any symptoms. Denies any new symptoms or s/s of UTI.    Routing to provider for final review. Patient is agreeable to disposition. Will close encounter.

## 2021-11-11 NOTE — Telephone Encounter (Signed)
Spoke with patient and informed her. °

## 2021-11-11 NOTE — Telephone Encounter (Signed)
I attempted to call patient to let her know Dr. Quincy Simmonds sent Refill. Her voice mail box is full.

## 2021-12-17 DIAGNOSIS — R491 Aphonia: Secondary | ICD-10-CM | POA: Insufficient documentation

## 2022-01-01 ENCOUNTER — Other Ambulatory Visit: Payer: Self-pay | Admitting: Obstetrics and Gynecology

## 2022-01-03 NOTE — Telephone Encounter (Signed)
Medication refill request: Vivelle Dot 0.05mg  #8, 5R Last AEX:  07-28-21 BS Next AEX: 08-01-22 Last MMG (if hormonal medication request): 07-30-21 Neg/BiRads1 Refill authorized: refill if appropriate

## 2022-02-09 DIAGNOSIS — R490 Dysphonia: Secondary | ICD-10-CM | POA: Insufficient documentation

## 2022-02-09 DIAGNOSIS — J385 Laryngeal spasm: Secondary | ICD-10-CM | POA: Insufficient documentation

## 2022-02-09 DIAGNOSIS — R4789 Other speech disturbances: Secondary | ICD-10-CM | POA: Insufficient documentation

## 2022-07-28 NOTE — Progress Notes (Signed)
56 y.o. G1P1 Divorced Caucasian female here for annual exam.   She was prescribed Ditropan XL for overactive bladder, which she stopped.  Has urgency and frequency.  No dysuria.   She is on ERT, and wants to continue.  It controls her hot flashes.   Seeing a Designer, jewellery for depression, Sharlyne Pacas on [place].   Feels like she is struggling. She has potential concerns about her ex-husband.  She does have concerns about her safety with respect to her husband.  Raising a teen age son.  PCP:   Carol Ada, MD  Patient's last menstrual period was 03/08/2019 (exact date).           Sexually active: Yes.    The current method of family planning is tubal ligation/Hyst.   Final pathology on hysterectomy - LGSIL of cervix.  Exercising: No.  The patient does not participate in regular exercise at present. Smoker:  no  Health Maintenance: Pap: 07-28-21 Neg:Pos HR HPV, 10-15-20 ASCUS:Pos HR HPV, 04-22-20 LGSIL:Pos HR HPV History of abnormal Pap:  yes, 07-28-21 Neg:Pos HR HPV, 10-15-20 ASCUS:Pos HR HPV, 04-22-20 LGSIL:Pos HR HPV, 10-22-19 ASCUS:Pos HR HPV--colpo LGSIL w/cervical stenosis & unable to assess endocervix--LEEP CIN I and ECC benign(showing minute atypical squamous fragment.04-17-18 LGSIL:Pos HR HPV--colpo LGSIL--LEEP was normal. 2014 LEEP CIN MMG:  07-30-21 Neg/Birads1 Colonoscopy:   2021 polyps removed;next 5-7 years. BMD: 10-19-17  Result :Normal TDaP:  10-07-13 Gardasil:   no HIV:07-28-21 NR Hep C:07-28-21 Neg Screening Labs: PCP   reports that she has never smoked. She has never used smokeless tobacco. She reports that she does not currently use alcohol. She reports that she does not use drugs.  Past Medical History:  Diagnosis Date   Abnormal Pap smear    Anemia    Anxiety    Arthritis    neck   Broken toe 12-22-15   middle toe right foot   Cataract of both eyes    Depression    GERD (gastroesophageal reflux disease)    Headache(784.0)    Heart murmur     History of hiatal hernia 2021   small   History of kidney stones    Hypothyroidism    Intestinal malabsorption following gastrectomy 06/19/2015   Migraine    Perforated bowel ([facility])    Pernicious anemia 06/19/2015   PONV (postoperative nausea and vomiting)    Thyroid disease    hypothyrodism    Past Surgical History:  Procedure Laterality Date   CATARACT EXTRACTION, BILATERAL Bilateral 02/2016   CERVICAL BIOPSY  W/ LOOP ELECTRODE EXCISION  11/2013, 05/2018, 11/2019   LGSIL, negative for dysplasia, LGSIL and positive ECC with potential LGSIL  - respectively   CERVIX LESION DESTRUCTION  1993   CIN III, [facility], recurrence--YAG laser   CESAREAN SECTION  2008   CHOLECYSTECTOMY     COLONOSCOPY  10/2020   CYSTOSCOPY N/A 10/26/2020   Procedure: CYSTOSCOPY;  Surgeon: Nunzio Cobbs, MD;  Location: [facility];  Service: Gynecology;  Laterality: N/A;   GASTRIC BYPASS  2002   HYSTEROSCOPY WITH D & C  10/14/2011   Procedure: DILATATION AND CURETTAGE (D&C) /HYSTEROSCOPY;  Surgeon: Arloa Koh;  Location: [facility];  Service: Gynecology;  Laterality: Bilateral;   LASIK     NOSE SURGERY     STOMACH SURGERY  04/20/2015   dilation of "connection between stomach and intestine", [facility]   TONSILLECTOMY     TONSILLECTOMY     TOTAL LAPAROSCOPIC HYSTERECTOMY  WITH BILATERAL SALPINGO OOPHORECTOMY N/A 10/26/2020   Procedure: TOTAL LAPAROSCOPIC HYSTERECTOMY WITH BILATERAL SALPINGECTOMY;  Surgeon: Nunzio Cobbs, MD;  Location: [facility];  Service: Gynecology;  Laterality: N/A;   TUBAL LIGATION  2008    Current Outpatient Medications  Medication Sig Dispense Refill   acetaminophen (TYLENOL) 325 MG tablet 1 tablet as needed     AIMOVIG 140 MG/ML SOAJ Inject 140 mg as directed every 30 (thirty) days.      ALPRAZolam (XANAX) 1 MG tablet Take 1 mg by mouth 2 (two) times daily as needed. Anxiety     APLENZIN 348 MG TB24 Take 1 tablet by mouth every morning.      Armodafinil 250 MG tablet Take 250 mg by mouth every morning.  2   Botulinum Toxin Type A (BOTOX) 200 units SOLR INJECT UP TO 200 UNITS  INTRAMUSCULARLY TO HEAD,  NECK, AND SHOULDERS EVERY  12 WEEKS (GIVEN AT MD  OFFICE, DISCARD UNUSED)     cyanocobalamin (,VITAMIN B-12,) 1000 MCG/ML injection Inject 1,000 mcg into the muscle every 21 ( twenty-one) days.      cyclobenzaprine (FLEXERIL) 10 MG tablet 1 tablet as needed     DEXILANT 60 MG capsule Take 60 mg by mouth daily.     diphenhydrAMINE (BENADRYL) 12.5 MG/5ML elixir Take 12.5 mg by mouth every 6 (six) hours as needed for allergies.      divalproex (DEPAKOTE) 500 MG DR tablet Take 1 tablet (500 mg total) by mouth 2 (two) times daily. 180 tablet 4   doxepin (SINEQUAN) 75 MG capsule Take 75 mg by mouth at bedtime.      doxepin (SINEQUAN) 75 MG capsule Take by mouth.     eletriptan (RELPAX) 40 MG tablet Take 1 tablet (40 mg total) by mouth as needed for migraine or headache. May repeat in 2 hours if needed. Max 2 tabs per day or 8 tabs per month. 10 tablet 12   estradiol (VIVELLE-DOT) 0.05 MG/24HR patch APPLY 1 PATCH(0.05 MG) TOPICALLY TO THE SKIN 2 TIMES A WEEK 8 patch 6   famotidine (PEPCID) 20 MG tablet one tab     fexofenadine (ALLEGRA) 180 MG tablet Take 180 mg by mouth daily as needed. allergies     fluticasone (FLONASE) 50 MCG/ACT nasal spray Place 1 spray into both nostrils as needed for allergies.   1   gabapentin (NEURONTIN) 300 MG capsule Take 300-900 mg by mouth See admin instructions. Taking 1 ('300mg'$ ) at night for 1 week, then 2 capsules ('600mg'$ ) for 2nd week, then 3 capsules ('900mg'$ ) at night until seen     hydrOXYzine (ATARAX/VISTARIL) 25 MG tablet Take 25 mg by mouth every 8 (eight) hours as needed (headache).   2   Lasmiditan Succinate 100 MG TABS Take 100 mg by mouth as needed (headache onset).      levothyroxine (SYNTHROID) 50 MCG tablet TAKE 1 TABLET BY MOUTH EVERY DAY 30 MINUTES BEFORE EATING 90 tablet 0   LINZESS 145 MCG CAPS  capsule Take 145 mcg by mouth daily.  3   Multiple Vitamins-Minerals (MULTIVITAMIN WITH MINERALS) tablet Take 1 tablet by mouth daily.     Naltrexone-buPROPion HCl ER (CONTRAVE) 8-90 MG TB12 Take by mouth.     nystatin (MYCOSTATIN/NYSTOP) powder Apply 1 application topically daily as needed (yeast infection). 30 g 2   nystatin-triamcinolone (MYCOLOG II) cream Apply 1 application topically 2 (two) times daily. Apply to affected area BID for up to 7 days. (Patient taking differently:  Apply 1 application  topically 2 (two) times daily as needed (rash).) 60 g 0   ondansetron (ZOFRAN) 4 MG tablet Take 4 mg by mouth every 8 (eight) hours as needed for nausea or vomiting.      oxybutynin (DITROPAN XL) 10 MG 24 hr tablet Take 1 tablet (10 mg total) by mouth at bedtime. 30 tablet 11   oxybutynin (DITROPAN-XL) 10 MG 24 hr tablet Take by mouth.     promethazine (PHENERGAN) 25 MG suppository Place 25 mg rectally every 6 (six) hours as needed for nausea or vomiting.      ranitidine (ZANTAC) 150 MG capsule Take by mouth.     temazepam (RESTORIL) 30 MG capsule Take by mouth.     thiamine (VITAMIN B-1) 50 MG tablet Take 50 mg by mouth daily.   0   tiZANidine (ZANAFLEX) 4 MG tablet Take 2-4 mg by mouth at bedtime.   0   TRINTELLIX 20 MG TABS tablet Take 20 mg by mouth daily.     Ubrogepant 50 MG TABS Take by mouth.     venlafaxine XR (EFFEXOR-XR) 150 MG 24 hr capsule Take 2 capsules by mouth every morning.     zolmitriptan (ZOMIG) 5 MG nasal solution Place into the nose.     zonisamide (ZONEGRAN) 100 MG capsule TAKE 2 CAPSULES BY MOUTH EVERY NIGHT AT BEDTIME. Take along w/ one of the '50mg'$  caps for total daily dose of '250mg'$      omeprazole (PRILOSEC) 20 MG capsule Take 20 mg by mouth daily as needed (heartburn).      No current facility-administered medications for this visit.    Family History  Problem Relation Age of Onset   Diabetes Mother    Heart disease Father    Social phobia Father    Psychiatric  Illness Father    Hypertension Father    Stroke Father    Dementia Father    Heart disease Other    Diabetes Other    Stroke Other    Hypertension Maternal Grandmother    Thyroid disease Maternal Grandmother    Hypertension Paternal Grandmother    Stroke Paternal Grandfather    Breast cancer Neg Hx     Review of Systems  Genitourinary:  Positive for dyspareunia.       Stress incontinence, urgency  Psychiatric/Behavioral:         Depressed  All other systems reviewed and are negative.   Exam:   BP 138/82   Pulse 77   Ht '5\' 3"'$  (1.6 m)   Wt 181 lb (82.1 kg)   LMP 03/08/2019 (Exact Date)   SpO2 97%   BMI 32.06 kg/m     General appearance: alert, cooperative and appears stated age Head: normocephalic, without obvious abnormality, atraumatic Neck: no adenopathy, supple, symmetrical, trachea midline and thyroid normal to inspection and palpation Lungs: clear to auscultation bilaterally Breasts: normal appearance, no masses or tenderness, No nipple retraction or dimpling, No nipple discharge or bleeding, No axillary adenopathy Heart: regular rate and rhythm Abdomen: soft, non-tender; no masses, no organomegaly Extremities: extremities normal, atraumatic, no cyanosis or edema Skin: skin color, texture, turgor normal. No rashes or lesions Lymph nodes: cervical, supraclavicular, and axillary nodes normal. Neurologic: grossly normal  Pelvic: External genitalia:  no lesions              No abnormal inguinal nodes palpated.              Urethra:  normal appearing urethra with no masses,  tenderness or lesions              Bartholins and Skenes: normal                 Vagina: normal appearing vagina with normal color and discharge, no lesions              Cervix: absent              Pap taken: yes Bimanual Exam:  Uterus: absent              Adnexa: no mass, fullness, tenderness              Rectal exam: yes.  Confirms.              Anus:  normal sphincter tone, no  lesions  Chaperone was present for exam:  Estill Bamberg, CMA  Assessment:   Well woman visit with gynecologic exam. Status post laparoscopic hysterectomy, bilateral salpingectomy, cystoscopy.  Final pathology CIN I.  Positive HR HPV Recurrent dysplasia of cervix. ERT. Overactive bladder.  Urinary urgency and frequency.  Atrophy of vagina.  Depression.  Hypothyroidism.   Plan: Mammogram screening discussed. Self breast awareness reviewed. Pap and HR HPV collected.  Guidelines for Calcium, Vitamin D, regular exercise program including cardiovascular and weight bearing exercise. Discused WHI and use of ERT which can increase risk of PE, DVT, MI, stroke. Refill of Vivelle Dot 0.05 mg twice weekly for one year.  Vagifem Rx.   Urinalysis and reflex culture. Ditropan XL 10 mg daily.   Redgie Grayer is not on her formulary.  I recommended she reach out to her mental health provider and that she seek any emergency care and legal resources if needed to be safe.  Follow up annually and prn.   After visit summary provided.

## 2022-08-01 ENCOUNTER — Ambulatory Visit (INDEPENDENT_AMBULATORY_CARE_PROVIDER_SITE_OTHER): Payer: 59 | Admitting: Obstetrics and Gynecology

## 2022-08-01 ENCOUNTER — Encounter: Payer: Self-pay | Admitting: Obstetrics and Gynecology

## 2022-08-01 ENCOUNTER — Other Ambulatory Visit (HOSPITAL_COMMUNITY)
Admission: RE | Admit: 2022-08-01 | Discharge: 2022-08-01 | Disposition: A | Discharge status: Home / Self Care | Payer: 59 | Source: Ambulatory Visit | Attending: Obstetrics and Gynecology | Admitting: Obstetrics and Gynecology

## 2022-08-01 VITALS — BP 138/82 | HR 77 | Ht 63.0 in | Wt 181.0 lb

## 2022-08-01 DIAGNOSIS — Z79899 Other long term (current) drug therapy: Secondary | ICD-10-CM

## 2022-08-01 DIAGNOSIS — N3281 Overactive bladder: Secondary | ICD-10-CM

## 2022-08-01 DIAGNOSIS — Z8619 Personal history of other infectious and parasitic diseases: Secondary | ICD-10-CM | POA: Diagnosis present

## 2022-08-01 DIAGNOSIS — Z01419 Encounter for gynecological examination (general) (routine) without abnormal findings: Secondary | ICD-10-CM | POA: Diagnosis not present

## 2022-08-01 DIAGNOSIS — Z87410 Personal history of cervical dysplasia: Secondary | ICD-10-CM | POA: Insufficient documentation

## 2022-08-01 DIAGNOSIS — N952 Postmenopausal atrophic vaginitis: Secondary | ICD-10-CM

## 2022-08-01 DIAGNOSIS — R3915 Urgency of urination: Secondary | ICD-10-CM | POA: Diagnosis not present

## 2022-08-01 DIAGNOSIS — Z79818 Long term (current) use of other agents affecting estrogen receptors and estrogen levels: Secondary | ICD-10-CM

## 2022-08-01 DIAGNOSIS — F32A Depression, unspecified: Secondary | ICD-10-CM

## 2022-08-01 MED ORDER — ESTRADIOL 10 MCG VA TABS: 1.0000 | ORAL_TABLET | VAGINAL | 3 refills | Status: DC

## 2022-08-01 MED ORDER — ESTRADIOL 0.05 MG/24HR TD PTTW: MEDICATED_PATCH | TRANSDERMAL | 3 refills | Status: DC

## 2022-08-01 MED ORDER — OXYBUTYNIN CHLORIDE ER 10 MG PO TB24: 10.0000 mg | ORAL_TABLET | Freq: Every day | ORAL | 3 refills | Status: DC

## 2022-08-01 NOTE — Patient Instructions (Signed)

## 2022-08-03 LAB — CULTURE INDICATED

## 2022-08-03 LAB — URINALYSIS, COMPLETE W/RFL CULTURE
Bilirubin Urine: NEGATIVE
Glucose, UA: NEGATIVE
Hyaline Cast: NONE SEEN /LPF
Ketones, ur: NEGATIVE
Leukocyte Esterase: NEGATIVE
Nitrites, Initial: NEGATIVE
Protein, ur: NEGATIVE
Specific Gravity, Urine: 1.025 (ref 1.001–1.035)
pH: 5.5 (ref 5.0–8.0)

## 2022-08-03 LAB — URINE CULTURE
MICRO NUMBER:: 13840016
Result:: NO GROWTH
SPECIMEN QUALITY:: ADEQUATE

## 2022-08-09 LAB — CYTOLOGY - PAP
Comment: NEGATIVE
Diagnosis: NEGATIVE
High risk HPV: NEGATIVE

## 2022-08-23 ENCOUNTER — Other Ambulatory Visit: Payer: Self-pay

## 2022-08-23 DIAGNOSIS — R3915 Urgency of urination: Secondary | ICD-10-CM

## 2022-08-23 NOTE — Telephone Encounter (Signed)
FYI. Pt was seen for AEX on 08/01/2022 and Dr. Quincy Simmonds Rx'd pt enough medication to last her until time for next annual. Will refuse this request and remind pharmacy of new Rx that was sent on 08/01/22.

## 2022-09-20 ENCOUNTER — Ambulatory Visit (INDEPENDENT_AMBULATORY_CARE_PROVIDER_SITE_OTHER): Payer: 59

## 2022-09-20 ENCOUNTER — Ambulatory Visit: Payer: 59 | Admitting: Orthopedic Surgery

## 2022-09-20 ENCOUNTER — Encounter: Payer: Self-pay | Admitting: Orthopedic Surgery

## 2022-09-20 VITALS — BP 146/92 | HR 62 | Ht 63.0 in | Wt 183.0 lb

## 2022-09-20 DIAGNOSIS — M5416 Radiculopathy, lumbar region: Secondary | ICD-10-CM | POA: Diagnosis not present

## 2022-09-20 DIAGNOSIS — M545 Low back pain, unspecified: Secondary | ICD-10-CM | POA: Diagnosis not present

## 2022-09-20 MED ORDER — GABAPENTIN 300 MG PO CAPS: 300.0000 mg | ORAL_CAPSULE | Freq: Three times a day (TID) | ORAL | 0 refills | Status: DC

## 2022-09-20 NOTE — Progress Notes (Signed)
Orthopedic Spine Surgery Office Note  Assessment: Patient is a 56 y.o. female with radicular type pain and a positive straight leg raise. She seems to be radiculopathy in a S1 distribution.    Plan: -Explained that initially conservative treatment is tried as a significant number of patients may experience relief with these treatment modalities. Discussed that the conservative treatments include:  -activity modification  -physical therapy  -over the counter pain medications  -medrol dosepak  -gabapentin -Patient has tried Medrol Dosepak, Flexeril, tramadol, and activity modification -Patient is worried about her tramadol and medrol dosepak running out in the next couple of days. Explained to her that tramadol is a narcotic so I recommended she come off of that medication when she is out of it. I also discussed the risks of being on steroids for a prolonged period of time so I prescribed her gabapentin to be taken once she runs out of her tramadol and Medrol Dosepak. -Recommended she do physical therapy.  A referral was provided to her today. -A work note was provided to her today stating that she will be off work for this week and can return on 09/26/2022.  It also asked that she be accommodated for standing more frequently when at work since this provides her symptomatic relief -If she is not better at the next visit, will recommend MRI lumbar spine -Patient should return to office in 5 weeks, repeat x-rays of lumbar spine at next visit: None   Patient expressed understanding of the plan and all questions were answered to the patient's satisfaction.   ___________________________________________________________________________   History:  Patient is a 56 y.o. female who presents today for lumbar spine.  Patient states that on the night of 09/13/2022 she was getting into bed up a step and noticed acute onset of lower back and right leg pain.  She has never had back issues in the past.  She  has never had symptoms like this before.  There is no trauma or injury that brought on the pain.  Pain starts in her lower back and radiates down the back of her thigh into her posterior lateral leg and into the plantar aspect of her foot.  She gets paresthesias in the same distribution as the pain.  She has no pain or paresthesias on her left lower extremity.  She notes that sitting or flexing her lumbar spine make the pain worse.  Laying down makes the pain better.   Weakness: Yes, feels her right leg is slightly weaker than her left Symptoms of imbalance: Yes, feels that her right leg is giving out from underneath her and causes her to be off balance Paresthesias and numbness: Yes, in an S1 distribution down her right leg.  None in her other extremities Bowel or bladder incontinence: Yes, has a multiyear history of urinary incontinence that she states started after her hysterectomy.  No new urinary symptoms.  No bowel incontinence. Saddle anesthesia: Denies  Treatments tried: Tramadol, Flexeril, Medrol Dosepak, activity modification  Review of systems: Denies unexplained weight loss, history of cancer.  Has at times awakened in pain since this started about a week ago.  Sometimes pain is significant and causes her to feel like she is sweating and having chills.  Past medical history: Depression Anxiety Migraines GERD  Allergies: Naproxen (anaphylaxis), sulfa  Past surgical history:  Gastric bypass Tonsillectomy Stomach stricture surgery Deviated septum surgery Cholecystectomy C-section Hysterectomy  Social history: Denies use of nicotine product (smoking, vaping, patches, smokeless) Alcohol use: Denies Denies  recreational drug use   Physical Exam:  General: no acute distress, appears stated age Neurologic: alert, answering questions appropriately, following commands Respiratory: unlabored breathing on room air, symmetric chest rise Psychiatric: appropriate affect, normal  cadence to speech   MSK (spine):  -Strength exam      Left  Right EHL    5/5  5/5 TA    5/5  5/5 GSC    5/5  5/5 Knee extension  5/5  5/5 Hip flexion   5/5  4+/5  Right hip flexion strength testing limited by pain  -Sensory exam    Sensation intact to light touch in L3-S1 nerve distributions of bilateral lower extremities  -Achilles DTR: 1/4 on the left, 1/4 on the right -Patellar tendon DTR: 1/4 on the left, 1/4 on the right  -Straight leg raise: Positive -Contralateral straight leg raise: Negative -Femoral nerve stretch test: Negative -Clonus: no beats bilaterally  -Left hip exam: No pain through range of motion, negative Stinchfield, negative Faber -Right hip exam: Positive FADIR but no other pain through range of motion, pain was not the same with FADIR maneuver as which she presented for, negative Stinchfield, negative Faber  Imaging: X-ray of the lumbar spine from 09/20/2022 was independently reviewed and interpreted, showing degenerative changes within the left hip with joint space narrowing.  No fracture or dislocation within the spine.  Decreased foraminal space at L5-S1.  No evidence of instability on flexion-extension.  No significant disc height loss.   Patient name: Megan Davila Patient MRN: [id number] Date of visit: 09/20/22

## 2022-09-23 ENCOUNTER — Emergency Department (HOSPITAL_BASED_OUTPATIENT_CLINIC_OR_DEPARTMENT_OTHER): Payer: 59

## 2022-09-23 ENCOUNTER — Other Ambulatory Visit: Payer: Self-pay

## 2022-09-23 ENCOUNTER — Emergency Department (HOSPITAL_BASED_OUTPATIENT_CLINIC_OR_DEPARTMENT_OTHER)
Admission: EM | Admit: 2022-09-23 | Discharge: 2022-09-23 | Disposition: A | Discharge status: Home / Self Care | Payer: 59 | Attending: Emergency Medicine | Admitting: Emergency Medicine

## 2022-09-23 ENCOUNTER — Encounter (HOSPITAL_BASED_OUTPATIENT_CLINIC_OR_DEPARTMENT_OTHER): Payer: Self-pay | Admitting: Emergency Medicine

## 2022-09-23 DIAGNOSIS — R200 Anesthesia of skin: Secondary | ICD-10-CM | POA: Diagnosis not present

## 2022-09-23 DIAGNOSIS — R4701 Aphasia: Secondary | ICD-10-CM | POA: Diagnosis present

## 2022-09-23 DIAGNOSIS — R270 Ataxia, unspecified: Secondary | ICD-10-CM | POA: Diagnosis not present

## 2022-09-23 LAB — COMPREHENSIVE METABOLIC PANEL
ALT: 39 U/L (ref 0–44)
AST: 16 U/L (ref 15–41)
Albumin: 3.8 g/dL (ref 3.5–5.0)
Alkaline Phosphatase: 67 U/L (ref 38–126)
Anion gap: 8 (ref 5–15)
BUN: 16 mg/dL (ref 6–20)
CO2: 29 mmol/L (ref 22–32)
Calcium: 8.7 mg/dL — ABNORMAL LOW (ref 8.9–10.3)
Chloride: 101 mmol/L (ref 98–111)
Creatinine, Ser: 0.97 mg/dL (ref 0.44–1.00)
GFR, Estimated: >60 mL/min (ref >60)
Glucose, Bld: 118 mg/dL — ABNORMAL HIGH (ref 70–99)
Potassium: 3.3 mmol/L — ABNORMAL LOW (ref 3.5–5.1)
Sodium: 138 mmol/L (ref 135–145)
Total Bilirubin: 0.4 mg/dL (ref 0.3–1.2)
Total Protein: 6.2 g/dL — ABNORMAL LOW (ref 6.5–8.1)

## 2022-09-23 LAB — CBC
HCT: 42.8 % (ref 36.0–46.0)
Hemoglobin: 14 g/dL (ref 12.0–15.0)
MCH: 30.8 pg (ref 26.0–34.0)
MCHC: 32.7 g/dL (ref 30.0–36.0)
MCV: 94.1 fL (ref 80.0–100.0)
Platelets: 272 10*3/uL (ref 150–400)
RBC: 4.55 MIL/uL (ref 3.87–5.11)
RDW: 12.4 % (ref 11.5–15.5)
WBC: 9.2 10*3/uL (ref 4.0–10.5)
nRBC: 0 % (ref 0.0–0.2)

## 2022-09-23 LAB — ETHANOL: Alcohol, Ethyl (B): <10 mg/dL (ref <10)

## 2022-09-23 LAB — CBG MONITORING, ED: Glucose-Capillary: 101 mg/dL — ABNORMAL HIGH (ref 70–99)

## 2022-09-23 MED ORDER — LORAZEPAM 1 MG PO TABS
0.5000 mg | ORAL_TABLET | Freq: Once | ORAL | Status: AC | PRN
  Administered 2022-09-23: 0.5 mg via ORAL
  Filled 2022-09-23: qty 1

## 2022-09-23 MED ORDER — IOHEXOL 350 MG/ML SOLN
100.0000 mL | Freq: Once | INTRAVENOUS | Status: AC | PRN
  Administered 2022-09-23: 75 mL via INTRAVENOUS

## 2022-09-23 NOTE — ED Notes (Signed)
IV removed, per Cortney - RN.

## 2022-09-23 NOTE — ED Provider Notes (Signed)
Patient care signed out to follow-up imaging results.  Patient had MRI of the brain and CT angiogram and results reviewed independently and no acute findings no acute stroke.  Patient's vital signs stable.  Patient stable for follow-up with primary doctor and neurology outpatient.  Golda Acre, MD 09/23/22 [phone]

## 2022-09-23 NOTE — ED Provider Notes (Signed)
[facility] EMERGENCY DEPT Provider Note   CSN: [id number] Arrival date & time: 09/23/22  1320     History  Chief Complaint  Patient presents with   Aphasia    ANALIAH DRUM is a 56 y.o. female.  HPI    56 year old female comes in with chief complaint of balance issues.  Patient states that around 1215, she suddenly noticed some difficulty with balance.  She also is having some right-sided facial numbness.  Balance issues is described as feeling unsteady and dizzy.  No vertigo.  Patient denies any double vision, slurred speech, difficulty in swallowing.  She has no significant medical history and denies any previous history of TIA-stroke.  Patient denies heavy smoking, substance use disorder.  Home Medications Prior to Admission medications   Medication Sig Start Date End Date Taking? Authorizing Provider  acetaminophen (TYLENOL) 325 MG tablet 1 tablet as needed    [provider]  AIMOVIG 140 MG/ML SOAJ Inject 140 mg as directed every 30 (thirty) days.  05/28/19   [provider]  ALPRAZolam Duanne Moron) 1 MG tablet Take 1 mg by mouth 2 (two) times daily as needed. Anxiety    [provider]  APLENZIN 348 MG TB24 Take 1 tablet by mouth every morning. 06/30/22   [provider]  Armodafinil 250 MG tablet Take 250 mg by mouth every morning. 05/24/18   [provider]  Botulinum Toxin Type A (BOTOX) 200 units SOLR INJECT UP TO 200 UNITS  INTRAMUSCULARLY TO HEAD,  NECK, AND SHOULDERS EVERY  12 WEEKS (GIVEN AT MD  OFFICE, DISCARD UNUSED) 01/01/21   [provider]  cyanocobalamin (,VITAMIN B-12,) 1000 MCG/ML injection Inject 1,000 mcg into the muscle every 21 ( twenty-one) days.  06/03/15   [provider]  cyclobenzaprine (FLEXERIL) 10 MG tablet 1 tablet as needed    [provider]  DEXILANT 60 MG capsule Take 60 mg by mouth daily. 02/25/15   [provider]  diphenhydrAMINE (BENADRYL) 12.5 MG/5ML  elixir Take 12.5 mg by mouth every 6 (six) hours as needed for allergies.     [provider]  divalproex (DEPAKOTE) 500 MG DR tablet Take 1 tablet (500 mg total) by mouth 2 (two) times daily. 04/23/18   Penumalli, Earlean Polka, MD  doxepin (SINEQUAN) 75 MG capsule Take 75 mg by mouth at bedtime.  07/09/19   [provider]  doxepin (SINEQUAN) 75 MG capsule Take by mouth. 01/22/20   [provider]  eletriptan (RELPAX) 40 MG tablet Take 1 tablet (40 mg total) by mouth as needed for migraine or headache. May repeat in 2 hours if needed. Max 2 tabs per day or 8 tabs per month. 04/23/18   Penumalli, Earlean Polka, MD  Estradiol (VAGIFEM) 10 MCG TABS vaginal tablet Place 1 tablet (10 mcg total) vaginally 2 (two) times a week. Use every night before bed for two weeks when you first begin this medicine, then after the first two weeks, begin using it twice a week. 08/01/22   Yisroel Ramming, Everardo All, MD  estradiol (VIVELLE-DOT) 0.05 MG/24HR patch APPLY 1 PATCH(0.05 MG) TOPICALLY TO THE SKIN 2 TIMES A WEEK 08/01/22   Nunzio Cobbs, MD  famotidine (PEPCID) 20 MG tablet one tab 06/09/20   [provider]  fexofenadine (ALLEGRA) 180 MG tablet Take 180 mg by mouth daily as needed. allergies    [provider]  fluticasone (FLONASE) 50 MCG/ACT nasal spray Place 1 spray into both nostrils  as needed for allergies.  01/17/17   [provider]  gabapentin (NEURONTIN) 300 MG capsule Take 1 capsule (300 mg total) by mouth 3 (three) times daily. 09/20/22   Callie Fielding, MD  hydrOXYzine (ATARAX/VISTARIL) 25 MG tablet Take 25 mg by mouth every 8 (eight) hours as needed (headache).  10/04/18   [provider]  Lasmiditan Succinate 100 MG TABS Take 100 mg by mouth as needed (headache onset).  11/26/19   [provider]  levothyroxine (SYNTHROID) 50 MCG tablet TAKE 1 TABLET BY MOUTH EVERY DAY 30 MINUTES BEFORE EATING 11/10/21   Yisroel Ramming, Everardo All, MD   LINZESS 145 MCG CAPS capsule Take 145 mcg by mouth daily. 09/03/18   [provider]  Multiple Vitamins-Minerals (MULTIVITAMIN WITH MINERALS) tablet Take 1 tablet by mouth daily.    [provider]  Naltrexone-buPROPion HCl ER (CONTRAVE) 8-90 MG TB12 Take by mouth. 01/23/20   [provider]  nystatin (MYCOSTATIN/NYSTOP) powder Apply 1 application topically daily as needed (yeast infection). 07/28/21   Nunzio Cobbs, MD  nystatin-triamcinolone (MYCOLOG II) cream Apply 1 application topically 2 (two) times daily. Apply to affected area BID for up to 7 days. Patient taking differently: Apply 1 application  topically 2 (two) times daily as needed (rash). 04/22/20   Nunzio Cobbs, MD  omeprazole (PRILOSEC) 20 MG capsule Take 20 mg by mouth daily as needed (heartburn).  08/04/20 11/13/20  [provider]  ondansetron (ZOFRAN) 4 MG tablet Take 4 mg by mouth every 8 (eight) hours as needed for nausea or vomiting.  10/22/19   [provider]  oxybutynin (DITROPAN XL) 10 MG 24 hr tablet Take 1 tablet (10 mg total) by mouth at bedtime. 08/01/22   Nunzio Cobbs, MD  promethazine (PHENERGAN) 25 MG suppository Place 25 mg rectally every 6 (six) hours as needed for nausea or vomiting.  01/10/20   [provider]  ranitidine (ZANTAC) 150 MG capsule Take by mouth. 05/31/15   [provider]  temazepam (RESTORIL) 30 MG capsule Take by mouth. 05/01/21   [provider]  thiamine (VITAMIN B-1) 50 MG tablet Take 50 mg by mouth daily.  08/07/15   [provider]  tiZANidine (ZANAFLEX) 4 MG tablet Take 2-4 mg by mouth at bedtime.  05/22/18   [provider]  TRINTELLIX 20 MG TABS tablet Take 20 mg by mouth daily. 06/30/22   [provider]  Ubrogepant 50 MG TABS Take by mouth. 06/30/22   [provider]  venlafaxine XR (EFFEXOR-XR) 150 MG 24 hr capsule Take 2 capsules by mouth every morning.  06/21/22   [provider]  zolmitriptan (ZOMIG) 5 MG nasal solution Place into the nose.    [provider]  zonisamide (ZONEGRAN) 100 MG capsule TAKE 2 CAPSULES BY MOUTH EVERY NIGHT AT BEDTIME. Take along w/ one of the '50mg'$  caps for total daily dose of '250mg'$  11/25/20   [provider]      Allergies    Naproxen, Amoxicillin-pot clavulanate, Monistat [miconazole], Sulfa antibiotics, and Tioconazole    Review of Systems   Review of Systems  All other systems reviewed and are negative.   Physical Exam Updated Vital Signs BP 123/81 (BP Location: Right Arm)   Pulse 69   Temp 98.4 F (36.9 C) (Oral)   Resp 16   Ht '5\' 3"'$  (1.6 m)   Wt 84.4 kg   LMP 03/08/2019 (Exact Date)  SpO2 99%   BMI 32.95 kg/m  Physical Exam Vitals and nursing note reviewed.  Constitutional:      Appearance: She is well-developed.  HENT:     Head: Atraumatic.  Eyes:     Extraocular Movements: Extraocular movements intact.     Pupils: Pupils are equal, round, and reactive to light.     Comments: Intact visual fields, no nystagmus  Cardiovascular:     Rate and Rhythm: Normal rate.  Pulmonary:     Effort: Pulmonary effort is normal.  Musculoskeletal:     Cervical back: Normal range of motion and neck supple.  Skin:    General: Skin is warm and dry.  Neurological:     Mental Status: She is alert and oriented to person, place, and time.     Cranial Nerves: No cranial nerve deficit.     Sensory: Sensory deficit present.     Motor: No weakness.     Coordination: Coordination normal.     Gait: Gait normal.     Comments: Subtle numbness to the right side of the face, subjective.     ED Results / Procedures / Treatments   Labs (all labs ordered are listed, but only abnormal results are displayed) Labs Reviewed  COMPREHENSIVE METABOLIC PANEL - Abnormal; Notable for the following components:      Result Value   Potassium 3.3 (*)    Glucose, Bld 118 (*)    Calcium 8.7 (*)     Total Protein 6.2 (*)    All other components within normal limits  CBG MONITORING, ED - Abnormal; Notable for the following components:   Glucose-Capillary 101 (*)    All other components within normal limits  CBC  ETHANOL  RAPID URINE DRUG SCREEN, HOSP PERFORMED    EKG None  Radiology MR BRAIN WO CONTRAST  Result Date: 09/23/2022 CLINICAL DATA:  Dizziness, persistent/recurrent, cardiac or vascular cause suspected Neuro deficit, acute, stroke suspected EXAM: MRI HEAD WITHOUT CONTRAST TECHNIQUE: Multiplanar, multiecho pulse sequences of the brain and surrounding structures were obtained without intravenous contrast. COMPARISON:  MRI September 26, 2018 without report. FINDINGS: Brain: No acute infarction, hemorrhage, hydrocephalus, extra-axial collection or mass lesion. A few small T2/FLAIR hyperintensities within the white matter, nonspecific but considered within normal limits for patient age. Vascular: Major arterial flow voids are maintained at the skull base. Skull and upper cervical spine: Normal marrow signal. Sinuses/Orbits: Clear sinuses.  No acute orbital findings. Other: No mastoid effusions. IMPRESSION: No evidence of acute intracranial abnormality. Electronically Signed   By: Margaretha Sheffield M.D.   On: 09/23/2022 15:12    Procedures Procedures    Medications Ordered in ED Medications  LORazepam (ATIVAN) tablet 0.5 mg (0.5 mg Oral Given 09/23/22 1438)  iohexol (OMNIPAQUE) 350 MG/ML injection 100 mL (75 mLs Intravenous Contrast Given 09/23/22 1522)    ED Course/ Medical Decision Making/ A&P     ABCD2 Score: 1                     Medical Decision Making Problems Addressed: Ataxia: complicated acute illness or injury Facial numbness: undiagnosed new problem with uncertain prognosis  Amount and/or Complexity of Data Reviewed Labs: ordered. Radiology: ordered.  Risk Prescription drug management.  This patient presents to the ED with chief complaint(s) of abnormal  balance, right-sided facial numbness with pertinent past medical history of no serious metabolic risk factors for stroke or previous history of TIA-stroke.The complaint involves an extensive differential diagnosis and also carries with  it a high risk of complications and morbidity.    The differential diagnosis includes : TIA, stroke, polypharmacy/medication side effects. I ambulated the patient, she was not unsteady.  No concerning findings consistent with posterior stroke.  She is still having some right-sided facial numbness.  The initial plan is to get basic labs, MRI of the brain without contrast and CT angiogram head and neck.  Patient's ABCD 2 score is 1, if this were to be TIA.  Plan is for patient to get the appropriate work-up in the ER, if negative she can be discharged with aspirin and follow-up with PCP.   Additional history obtained: Records reviewed Primary Care Documents reviewed patient's medications and past medical history  Independent labs interpretation:  The following labs were independently interpreted: CBC, BMP are reassuring.  Mild hypokalemia noted.  Independent visualization and interpretation of imaging: - I independently visualized the following imaging with scope of interpretation limited to determining acute life threatening conditions related to emergency care: MRI of the brain, which revealed no evidence of brain bleed. CT angiogram head and neck pending.  Treatment and Reassessment: CT angio pending at this time.  MRI negative for stroke.  Patient's care will be signed out to incoming team.  Anticipate discharge.   Final Clinical Impression(s) / ED Diagnoses Final diagnoses:  Ataxia  Facial numbness    Rx / DC Orders ED Discharge Orders     None         Varney Biles, MD 09/23/22 1528

## 2022-09-23 NOTE — ED Notes (Signed)
Discharge instructions, follow up care, and prescriptions reviewed and explained, pt verbalized understanding. Pt caox4 and ambulatory with no obvious neuro s/s on d/c.

## 2022-09-23 NOTE — ED Triage Notes (Signed)
Episode of having no balance, and aphasia Happened at 12:15 lasted approximately 10 mins.  Some decrease sensation to right side face now,  Started gabapentin recently

## 2022-09-23 NOTE — ED Notes (Addendum)
Patient transported to MRI 

## 2022-09-23 NOTE — Discharge Instructions (Signed)
Follow-up with primary doctor and/or neurology after the weekend.  Return for new or worsening signs or symptoms.  Your MRI and CT scan today were reassuring no signs of stroke.

## 2022-09-26 NOTE — Therapy (Addendum)
OUTPATIENT PHYSICAL THERAPY THORACOLUMBAR EVALUATION DISCHARGE   Patient Name: Megan Davila MRN: [id number] DOB:February 10, 1966, 56 y.o., female Today's Date: 09/27/2022   PT End of Session - 09/27/22 0921     Visit Number 1    PT Start Time 0800    PT Stop Time 0845    PT Time Calculation (min) 45 min             Past Medical History:  Diagnosis Date   Abnormal Pap smear    Anemia    Anxiety    Arthritis    neck   Broken toe 12-22-15   middle toe right foot   Cataract of both eyes    Depression    GERD (gastroesophageal reflux disease)    Headache(784.0)    Heart murmur    History of hiatal hernia 2021   small   History of kidney stones    Hypothyroidism    Intestinal malabsorption following gastrectomy 06/19/2015   Migraine    Perforated bowel ([place])    Pernicious anemia 06/19/2015   PONV (postoperative nausea and vomiting)    Thyroid disease    hypothyrodism   Past Surgical History:  Procedure Laterality Date   CATARACT EXTRACTION, BILATERAL Bilateral 02/2016   CERVICAL BIOPSY  W/ LOOP ELECTRODE EXCISION  11/2013, 05/2018, 11/2019   LGSIL, negative for dysplasia, LGSIL and positive ECC with potential LGSIL  - respectively   CERVIX LESION DESTRUCTION  1993   CIN III, [facility], recurrence--YAG laser   CESAREAN SECTION  2008   CHOLECYSTECTOMY     COLONOSCOPY  10/2020   CYSTOSCOPY N/A 10/26/2020   Procedure: CYSTOSCOPY;  Surgeon: Nunzio Cobbs, MD;  Location: [facility];  Service: Gynecology;  Laterality: N/A;   GASTRIC BYPASS  2002   HYSTEROSCOPY WITH D & C  10/14/2011   Procedure: DILATATION AND CURETTAGE (D&C) /HYSTEROSCOPY;  Surgeon: Arloa Koh;  Location: [facility];  Service: Gynecology;  Laterality: Bilateral;   LASIK     NOSE SURGERY     STOMACH SURGERY  04/20/2015   dilation of "connection between stomach and intestine", [facility]   TONSILLECTOMY     TONSILLECTOMY     TOTAL LAPAROSCOPIC HYSTERECTOMY WITH BILATERAL SALPINGO  OOPHORECTOMY N/A 10/26/2020   Procedure: TOTAL LAPAROSCOPIC HYSTERECTOMY WITH BILATERAL SALPINGECTOMY;  Surgeon: Nunzio Cobbs, MD;  Location: [facility];  Service: Gynecology;  Laterality: N/A;   TUBAL LIGATION  2008   Patient Active Problem List   Diagnosis Date Noted   Status post laparoscopic hysterectomy 10/26/2020   Vitamin D deficiency 05/23/2017   Anxiety 06/19/2015   Anemia, iron deficiency 06/19/2015   Pernicious anemia 06/19/2015   Intestinal malabsorption following gastrectomy 06/19/2015   Protein-calorie malnutrition, severe ([place]) 04/18/2015   Severe protein-calorie malnutrition ([place]) 04/18/2015   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   Bariatric surgery status 04/18/2015   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 Pt with discharge instructions to follow up with a neurologist as soon as possible.   PERTINENT HISTORY:  Anemia, depression, GERD, anxiety, heart murmur, migraines, thyroid disease    ASSESSMENT:  CLINICAL IMPRESSION: Pt arriving to clinic today reporting recent ER visit for symptoms of TIA. Pt with discharge instructions to follow up with a neurologist and PCP. Pt stating she has been unable to get an  appointment on her own. Pt also  reporting SOB and fatigue this morning. Pt's BP was 136/100 with pulse rate ranging from 90-94 bpm at rest. I discussed holding PT evaluation for now and I assisted pt with setting up an appointment with her PCP today at 2:15 pm as well as a follow up with a neurologist on 10/03/22 at 12:50 pm. I edu pt on signs and symptoms of stroke.    Oretha Caprice, PT, MPT 09/27/2022, 9:36 AM

## 2022-09-27 ENCOUNTER — Encounter: Payer: Self-pay | Admitting: Neurology

## 2022-09-27 ENCOUNTER — Ambulatory Visit (INDEPENDENT_AMBULATORY_CARE_PROVIDER_SITE_OTHER): Payer: 59 | Admitting: Physical Therapy

## 2022-09-27 ENCOUNTER — Encounter: Payer: Self-pay | Admitting: Physical Therapy

## 2022-09-27 DIAGNOSIS — M5459 Other low back pain: Secondary | ICD-10-CM

## 2022-09-28 NOTE — Progress Notes (Signed)
NEUROLOGY CONSULTATION NOTE  Megan Davila MRN: [id number] DOB: 11/30/1966  Referring provider: Varney Biles, MD (ED referral) Primary care provider: Carol Ada, MD  Reason for consult:  ataxia, migraines  Assessment/Plan:   Transient ischemic attack - based on speech disturbance and right sided numbness, cannot rule out cerebrovascular event.   Chronic migraine without aura, without status migrainosus, not intractable  Advised to start ASA EC '81mg'$  daily for secondary stroke prevention. Complete stroke workup: Lipid panel and Hgb A1c 2D echo with bubble study 2 week cardiac event monitor Mediterranean diet Following up with PCP directly from here - discuss blood pressure Management of her migraines per her headache specialist, Dr. Ashok Pall Further recommendations pending results.  Otherwise follow up in 6 months.    Subjective:  Megan Davila is a 56 year old female with migraines, hypothyroidism, cataract who presents for ataxia and migraines.  History supplemented by ED note.  On 09/23/2022, she had an incident where she developed sudden onset trouble getting words out, ataxia, dizziness and right sided facial numbness and tingling in right foot.  No double vision, slurred speech, facial droop or unilateral numbness and weakness.  Denies prior history of similar symptoms.  Seen in [facility] ED at [facility].  MRI of brain without contrast personally reviewed revealed minimal scattered nonspecific punctate T2/FLAIR foci in the bilateral cerebral white matter.  CTA head and neck personally reviewed revealed no LVO or hemodynamically significant stenosis.  EKG showed NSR but revealed evidence of septal infarct but troponin was negative.  She was discharged on ASA but she never started it because of her history of gastric bypass. Symptoms were severe for 20 minutes and then gradually resolved.  She has history of chronic refractory migraines.  No associated headache and  this event was nothing like any prior migraine.  Since then, reports bilateral weakness and lethargy.  Reports elevated blood pressures since then.  Heart feels like it is sometimes racing.  She also has anxiety.  Patient has history of migraines treated by a headache specialist at [facility], Dr. Ashok Pall. Onset:  her 30s Location:  behind eyes, side of head Quality:  sharp, pressure Intensity:  severe.   Aura:  absent Prodrome:  absent Associated symptoms:  Nausea, vomiting, photophobia, phonophobia, osmophobia, tinnitus, floaters  She denies associated unilateral numbness or weakness. Duration:  6-8 hours up to 2-3 days Frequency:  15 severe headache days, 8 moderate headache days Frequency of abortive medication: more than 2 days a week Triggers:  change in barometric pressure, stress Relieving factors:  nothing Activity:  aggravates  Rescue protocol: first line Ubrelvy, second line eletriptan; Rarely takes Zomig NS as third line.  Hydroxyzine for severe cases.    Past NSAIDS/analgesics:  ibuprofen, tramadol Past abortive triptans:  rizatriptan, zolmitriptan Past abortive ergotamine:  none Past muscle relaxants:  none Past anti-emetic:  none Past antihypertensive medications:  none Past antidepressant medications:  imipramine, fluoxetine, bupropion Past anticonvulsant medications:  topiramate Past anti-CGRP:  Aimovig (nausea) Past vitamins/Herbal/Supplements:  none Past antihistamines/decongestants:  none Other past therapies:  none  Current NSAIDS/analgesics:  acetaminophen Current triptans:  eletriptan '40mg'$ , Zomig '5mg'$  NS Current ergotamine:  none Current anti-emetic:  ondansetron '4mg'$ , promethazine '25mg'$  PR Current muscle relaxants:  cylcobenzaprine '10mg'$  PRN Current Antihypertensive medications:  none Current Antidepressant medications:  venlafaxine XR '300mg'$  daily, doxepin '75mg'$  daily Current Anticonvulsant medications:  divalproex DR '500mg'$  BID, zonisamide '250mg'$  daily,  gabapentin '300mg'$  TID Current anti-CGRP:  Ubrelvy '50mg'$  Current Vitamins/Herbal/Supplements:  none Current Antihistamines/Decongestants:  diphenhydramine PRN, fluticasone NS, fexofenadine, hydroxyzine '25mg'$  PRN Other therapy:  Botox in between trigger point injections Birth control:  none    Caffeine:  a little bit of iced tea or Coke Zero.  No coffee Diet:  hydrates Family history of headache:  no.  Strokes and MIs run in family      PAST MEDICAL HISTORY: Past Medical History:  Diagnosis Date   Abnormal Pap smear    Anemia    Anxiety    Arthritis    neck   Broken toe 12-22-15   middle toe right foot   Cataract of both eyes    Depression    GERD (gastroesophageal reflux disease)    Headache(784.0)    Heart murmur    History of hiatal hernia 2021   small   History of kidney stones    Hypothyroidism    Intestinal malabsorption following gastrectomy 06/19/2015   Migraine    Perforated bowel ([place])    Pernicious anemia 06/19/2015   PONV (postoperative nausea and vomiting)    Thyroid disease    hypothyrodism    PAST SURGICAL HISTORY: Past Surgical History:  Procedure Laterality Date   CATARACT EXTRACTION, BILATERAL Bilateral 02/2016   CERVICAL BIOPSY  W/ LOOP ELECTRODE EXCISION  11/2013, 05/2018, 11/2019   LGSIL, negative for dysplasia, LGSIL and positive ECC with potential LGSIL  - respectively   CERVIX LESION DESTRUCTION  1993   CIN III, [facility], recurrence--YAG laser   CESAREAN SECTION  2008   CHOLECYSTECTOMY     COLONOSCOPY  10/2020   CYSTOSCOPY N/A 10/26/2020   Procedure: CYSTOSCOPY;  Surgeon: Nunzio Cobbs, MD;  Location: [facility];  Service: Gynecology;  Laterality: N/A;   GASTRIC BYPASS  2002   HYSTEROSCOPY WITH D & C  10/14/2011   Procedure: DILATATION AND CURETTAGE (D&C) /HYSTEROSCOPY;  Surgeon: Arloa Koh;  Location: [facility];  Service: Gynecology;  Laterality: Bilateral;   LASIK     NOSE SURGERY     STOMACH SURGERY  04/20/2015    dilation of "connection between stomach and intestine", [facility]   TONSILLECTOMY     TONSILLECTOMY     TOTAL LAPAROSCOPIC HYSTERECTOMY WITH BILATERAL SALPINGO OOPHORECTOMY N/A 10/26/2020   Procedure: TOTAL LAPAROSCOPIC HYSTERECTOMY WITH BILATERAL SALPINGECTOMY;  Surgeon: Nunzio Cobbs, MD;  Location: [facility];  Service: Gynecology;  Laterality: N/A;   TUBAL LIGATION  2008    MEDICATIONS: Current Outpatient Medications on File Prior to Visit  Medication Sig Dispense Refill   acetaminophen (TYLENOL) 325 MG tablet 1 tablet as needed     AIMOVIG 140 MG/ML SOAJ Inject 140 mg as directed every 30 (thirty) days.      ALPRAZolam (XANAX) 1 MG tablet Take 1 mg by mouth 2 (two) times daily as needed. Anxiety     APLENZIN 348 MG TB24 Take 1 tablet by mouth every morning.     Armodafinil 250 MG tablet Take 250 mg by mouth every morning.  2   Botulinum Toxin Type A (BOTOX) 200 units SOLR INJECT UP TO 200 UNITS  INTRAMUSCULARLY TO HEAD,  NECK, AND SHOULDERS EVERY  12 WEEKS (GIVEN AT MD  OFFICE, DISCARD UNUSED)     cyanocobalamin (,VITAMIN B-12,) 1000 MCG/ML injection Inject 1,000 mcg into the muscle every 21 ( twenty-one) days.      cyclobenzaprine (FLEXERIL) 10 MG tablet 1 tablet as needed     DEXILANT 60 MG capsule Take 60 mg  by mouth daily.     diphenhydrAMINE (BENADRYL) 12.5 MG/5ML elixir Take 12.5 mg by mouth every 6 (six) hours as needed for allergies.      divalproex (DEPAKOTE) 500 MG DR tablet Take 1 tablet (500 mg total) by mouth 2 (two) times daily. 180 tablet 4   doxepin (SINEQUAN) 75 MG capsule Take 75 mg by mouth at bedtime.      doxepin (SINEQUAN) 75 MG capsule Take by mouth.     eletriptan (RELPAX) 40 MG tablet Take 1 tablet (40 mg total) by mouth as needed for migraine or headache. May repeat in 2 hours if needed. Max 2 tabs per day or 8 tabs per month. 10 tablet 12   Estradiol (VAGIFEM) 10 MCG TABS vaginal tablet Place 1 tablet (10 mcg total) vaginally 2 (two)  times a week. Use every night before bed for two weeks when you first begin this medicine, then after the first two weeks, begin using it twice a week. 34 tablet 3   estradiol (VIVELLE-DOT) 0.05 MG/24HR patch APPLY 1 PATCH(0.05 MG) TOPICALLY TO THE SKIN 2 TIMES A WEEK 24 patch 3   famotidine (PEPCID) 20 MG tablet one tab     fexofenadine (ALLEGRA) 180 MG tablet Take 180 mg by mouth daily as needed. allergies     fluticasone (FLONASE) 50 MCG/ACT nasal spray Place 1 spray into both nostrils as needed for allergies.   1   gabapentin (NEURONTIN) 300 MG capsule Take 1 capsule (300 mg total) by mouth 3 (three) times daily. 84 capsule 0   hydrOXYzine (ATARAX/VISTARIL) 25 MG tablet Take 25 mg by mouth every 8 (eight) hours as needed (headache).   2   Lasmiditan Succinate 100 MG TABS Take 100 mg by mouth as needed (headache onset).      levothyroxine (SYNTHROID) 50 MCG tablet TAKE 1 TABLET BY MOUTH EVERY DAY 30 MINUTES BEFORE EATING 90 tablet 0   LINZESS 145 MCG CAPS capsule Take 145 mcg by mouth daily.  3   Multiple Vitamins-Minerals (MULTIVITAMIN WITH MINERALS) tablet Take 1 tablet by mouth daily.     Naltrexone-buPROPion HCl ER (CONTRAVE) 8-90 MG TB12 Take by mouth.     nystatin (MYCOSTATIN/NYSTOP) powder Apply 1 application topically daily as needed (yeast infection). 30 g 2   nystatin-triamcinolone (MYCOLOG II) cream Apply 1 application topically 2 (two) times daily. Apply to affected area BID for up to 7 days. (Patient taking differently: Apply 1 application  topically 2 (two) times daily as needed (rash).) 60 g 0   omeprazole (PRILOSEC) 20 MG capsule Take 20 mg by mouth daily as needed (heartburn).      ondansetron (ZOFRAN) 4 MG tablet Take 4 mg by mouth every 8 (eight) hours as needed for nausea or vomiting.      oxybutynin (DITROPAN XL) 10 MG 24 hr tablet Take 1 tablet (10 mg total) by mouth at bedtime. 90 tablet 3   promethazine (PHENERGAN) 25 MG suppository Place 25 mg rectally every 6 (six) hours  as needed for nausea or vomiting.      ranitidine (ZANTAC) 150 MG capsule Take by mouth.     temazepam (RESTORIL) 30 MG capsule Take by mouth.     thiamine (VITAMIN B-1) 50 MG tablet Take 50 mg by mouth daily.   0   tiZANidine (ZANAFLEX) 4 MG tablet Take 2-4 mg by mouth at bedtime.   0   TRINTELLIX 20 MG TABS tablet Take 20 mg by mouth daily.     Ubrogepant  50 MG TABS Take by mouth.     venlafaxine XR (EFFEXOR-XR) 150 MG 24 hr capsule Take 2 capsules by mouth every morning.     zolmitriptan (ZOMIG) 5 MG nasal solution Place into the nose.     zonisamide (ZONEGRAN) 100 MG capsule TAKE 2 CAPSULES BY MOUTH EVERY NIGHT AT BEDTIME. Take along w/ one of the '50mg'$  caps for total daily dose of '250mg'$      No current facility-administered medications on file prior to visit.    ALLERGIES: Allergies  Allergen Reactions   Naproxen Anaphylaxis    Other reaction(s): Other Goes unconcious   Amoxicillin-Pot Clavulanate Other (See Comments) and Rash    Mouth sores Mouth sores Mouth sores Mouth sores Mouth sores Mouth sores Mouth sores    Monistat [Miconazole] Other (See Comments)    "burning"    Sulfa Antibiotics Hives   Tioconazole Itching    FAMILY HISTORY: Family History  Problem Relation Age of Onset   Diabetes Mother    Heart disease Father    Social phobia Father    Psychiatric Illness Father    Hypertension Father    Stroke Father    Dementia Father    Heart disease Other    Diabetes Other    Stroke Other    Hypertension Maternal Grandmother    Thyroid disease Maternal Grandmother    Hypertension Paternal Grandmother    Stroke Paternal Grandfather    Breast cancer Neg Hx     Objective:  Blood pressure 123/88, pulse 94, height '5\' 3"'$  (1.6 m), weight 184 lb 6.4 oz (83.6 kg), last menstrual period 03/08/2019, SpO2 99 %. General: No acute distress.  Patient appears well-groomed.   Head:  Normocephalic/atraumatic Eyes:  fundi examined but not visualized Neck: supple, no  paraspinal tenderness, full range of motion Back: No paraspinal tenderness Heart: regular rate and rhythm Lungs: Clear to auscultation bilaterally. Vascular: No carotid bruits. Neurological Exam: Mental status: alert and oriented to person, place, and time, speech fluent and not dysarthric, language intact. Cranial nerves: CN I: not tested CN II: pupils equal, round and reactive to light, visual fields intact CN III, IV, VI:  full range of motion, no nystagmus, no ptosis CN V: facial sensation intact. CN VII: upper and lower face symmetric CN VIII: hearing intact CN IX, X: gag intact, uvula midline CN XI: sternocleidomastoid and trapezius muscles intact CN XII: tongue midline Bulk & Tone: normal, no fasciculations. Motor:  muscle strength 5/5 throughout Sensation:  Pinprick, temperature and vibratory sensation intact. Deep Tendon Reflexes:  2+ throughout,  toes downgoing.   Finger to nose testing:  Without dysmetria.   Heel to shin:  Without dysmetria.   Gait:  Normal station and stride.  Romberg negative.    Thank you for allowing me to take part in the care of this patient.  Metta Clines, DO  CC: Carol Ada, MD

## 2022-09-30 ENCOUNTER — Encounter (HOSPITAL_COMMUNITY): Payer: Self-pay | Admitting: Emergency Medicine

## 2022-09-30 ENCOUNTER — Emergency Department (HOSPITAL_COMMUNITY)
Admission: EM | Admit: 2022-09-30 | Discharge: 2022-10-01 | Discharge status: Against Medical Advice | Payer: 59 | Attending: Emergency Medicine | Admitting: Emergency Medicine

## 2022-09-30 ENCOUNTER — Other Ambulatory Visit: Payer: Self-pay

## 2022-09-30 ENCOUNTER — Emergency Department (HOSPITAL_COMMUNITY): Payer: 59

## 2022-09-30 DIAGNOSIS — Z5321 Procedure and treatment not carried out due to patient leaving prior to being seen by health care provider: Secondary | ICD-10-CM | POA: Insufficient documentation

## 2022-09-30 DIAGNOSIS — R000 Tachycardia, unspecified: Secondary | ICD-10-CM | POA: Insufficient documentation

## 2022-09-30 DIAGNOSIS — R0789 Other chest pain: Secondary | ICD-10-CM | POA: Insufficient documentation

## 2022-09-30 DIAGNOSIS — R0602 Shortness of breath: Secondary | ICD-10-CM | POA: Diagnosis present

## 2022-09-30 DIAGNOSIS — R531 Weakness: Secondary | ICD-10-CM | POA: Diagnosis not present

## 2022-09-30 LAB — CBC
HCT: 43.3 % (ref 36.0–46.0)
Hemoglobin: 14.1 g/dL (ref 12.0–15.0)
MCH: 31 pg (ref 26.0–34.0)
MCHC: 32.6 g/dL (ref 30.0–36.0)
MCV: 95.2 fL (ref 80.0–100.0)
Platelets: 314 10*3/uL (ref 150–400)
RBC: 4.55 MIL/uL (ref 3.87–5.11)
RDW: 12.4 % (ref 11.5–15.5)
WBC: 8.9 10*3/uL (ref 4.0–10.5)
nRBC: 0 % (ref 0.0–0.2)

## 2022-09-30 LAB — BASIC METABOLIC PANEL
Anion gap: 11 (ref 5–15)
BUN: 12 mg/dL (ref 6–20)
CO2: 25 mmol/L (ref 22–32)
Calcium: 9.2 mg/dL (ref 8.9–10.3)
Chloride: 102 mmol/L (ref 98–111)
Creatinine, Ser: 1.04 mg/dL — ABNORMAL HIGH (ref 0.44–1.00)
GFR, Estimated: >60 mL/min (ref >60)
Glucose, Bld: 115 mg/dL — ABNORMAL HIGH (ref 70–99)
Potassium: 4 mmol/L (ref 3.5–5.1)
Sodium: 138 mmol/L (ref 135–145)

## 2022-09-30 LAB — TROPONIN I (HIGH SENSITIVITY)
Troponin I (High Sensitivity): 4 ng/L (ref <18)
Troponin I (High Sensitivity): 4 ng/L (ref <18)

## 2022-09-30 LAB — I-STAT BETA HCG BLOOD, ED (MC, WL, AP ONLY): I-stat hCG, quantitative: <5 m[IU]/mL (ref <5)

## 2022-09-30 LAB — D-DIMER, QUANTITATIVE: D-Dimer, Quant: <0.27 ug/mL-FEU (ref 0.00–0.50)

## 2022-09-30 NOTE — ED Triage Notes (Signed)
Patient here after checking her blood pressure at home and measuring it to be 174/118 using her home device. Patient's blood pressure in triage 139/97. Patient denies chest pain, denies shortness of breath. Patient is alert, oriented, and in no apparent distress at this time.

## 2022-09-30 NOTE — ED Provider Triage Note (Signed)
Emergency Medicine Provider Triage Evaluation Note  Megan Davila , a 56 y.o. female  was evaluated in triage.  Pt complains of SOB worsening since she was here in [facility] for TIA.   She states her breathing became much worse today and feels more weak and has chest tightness.   Review of Systems  Positive: SOB Negative: Fever   Physical Exam  BP (!) 139/97   Pulse (!) 106   Temp 98.4 F (36.9 C) (Oral)   Resp 18   LMP 03/08/2019 (Exact Date)   SpO2 100%  Gen:   Awake, no distress   Resp:  Normal effort  MSK:   Moves extremities without difficulty  Other:  Lungs clear, tachycardic.   Medical Decision Making  Medically screening exam initiated at 7:04 PM.  Appropriate orders placed.  Megan Davila was informed that the remainder of the evaluation will be completed by another provider, this initial triage assessment does not replace that evaluation, and the importance of remaining in the ED until their evaluation is complete.  Labs, ekg chest xray , dimer    Megan Davila, [place] 09/30/22 1907

## 2022-10-01 NOTE — ED Notes (Signed)
Patient left on own accord °

## 2022-10-03 ENCOUNTER — Other Ambulatory Visit: Payer: Self-pay | Admitting: Neurology

## 2022-10-03 ENCOUNTER — Ambulatory Visit (INDEPENDENT_AMBULATORY_CARE_PROVIDER_SITE_OTHER): Payer: 59 | Admitting: Neurology

## 2022-10-03 ENCOUNTER — Encounter: Payer: Self-pay | Admitting: Neurology

## 2022-10-03 ENCOUNTER — Other Ambulatory Visit (INDEPENDENT_AMBULATORY_CARE_PROVIDER_SITE_OTHER): Payer: 59

## 2022-10-03 VITALS — BP 123/88 | HR 94 | Ht 63.0 in | Wt 184.4 lb

## 2022-10-03 DIAGNOSIS — R000 Tachycardia, unspecified: Secondary | ICD-10-CM

## 2022-10-03 DIAGNOSIS — G459 Transient cerebral ischemic attack, unspecified: Secondary | ICD-10-CM

## 2022-10-03 DIAGNOSIS — R42 Dizziness and giddiness: Secondary | ICD-10-CM

## 2022-10-03 NOTE — Progress Notes (Unsigned)
Enrolled for [employer] to mail a ZIO AT Live Telemetry monitor to patients address on file.   Dr. Acie Fredrickson to read.

## 2022-10-03 NOTE — Patient Instructions (Signed)
Start enteric coated aspirin (aspirin EC) '81mg'$  daily Check fasting lipid panel and Hgb A1c Check 2D echocardiogram with bubble study  Check 2 week cardiac event monitor Mediterranean diet (see below) Further recommendations pending results.  Otherwise follow up 6 months.   Mediterranean Diet A Mediterranean diet refers to food and lifestyle choices that are based on the traditions of countries located on the [employer]. It focuses on eating more fruits, vegetables, whole grains, beans, nuts, seeds, and heart-healthy fats, and eating less dairy, meat, eggs, and processed foods with added sugar, salt, and fat. This way of eating has been shown to help prevent certain conditions and improve outcomes for people who have chronic diseases, like kidney disease and heart disease. What are tips for following this plan? Reading food labels Check the serving size of packaged foods. For foods such as rice and pasta, the serving size refers to the amount of cooked product, not dry. Check the total fat in packaged foods. Avoid foods that have saturated fat or trans fats. Check the ingredient list for added sugars, such as corn syrup. Shopping  Buy a variety of foods that offer a balanced diet, including: Fresh fruits and vegetables (produce). Grains, beans, nuts, and seeds. Some of these may be available in unpackaged forms or large amounts (in bulk). Fresh seafood. Poultry and eggs. Low-fat dairy products. Buy whole ingredients instead of prepackaged foods. Buy fresh fruits and vegetables in-season from local farmers markets. Buy plain frozen fruits and vegetables. If you do not have access to quality fresh seafood, buy precooked frozen shrimp or canned fish, such as tuna, salmon, or sardines. Stock your pantry so you always have certain foods on hand, such as olive oil, canned tuna, canned tomatoes, rice, pasta, and beans. Cooking Cook foods with extra-virgin olive oil instead of using butter  or other vegetable oils. Have meat as a side dish, and have vegetables or grains as your main dish. This means having meat in small portions or adding small amounts of meat to foods like pasta or stew. Use beans or vegetables instead of meat in common dishes like chili or lasagna. Experiment with different cooking methods. Try roasting, broiling, steaming, and sauting vegetables. Add frozen vegetables to soups, stews, pasta, or rice. Add nuts or seeds for added healthy fats and plant protein at each meal. You can add these to yogurt, salads, or vegetable dishes. Marinate fish or vegetables using olive oil, lemon juice, garlic, and fresh herbs. Meal planning Plan to eat one vegetarian meal one day each week. Try to work up to two vegetarian meals, if possible. Eat seafood two or more times a week. Have healthy snacks readily available, such as: Vegetable sticks with hummus. Greek yogurt. Fruit and nut trail mix. Eat balanced meals throughout the week. This includes: Fruit: 2-3 servings a day. Vegetables: 4-5 servings a day. Low-fat dairy: 2 servings a day. Fish, poultry, or lean meat: 1 serving a day. Beans and legumes: 2 or more servings a week. Nuts and seeds: 1-2 servings a day. Whole grains: 6-8 servings a day. Extra-virgin olive oil: 3-4 servings a day. Limit red meat and sweets to only a few servings a month. Lifestyle  Cook and eat meals together with your family, when possible. Drink enough fluid to keep your urine pale yellow. Be physically active every day. This includes: Aerobic exercise like running or swimming. Leisure activities like gardening, walking, or housework. Get 7-8 hours of sleep each night. If recommended by your health care  provider, drink red wine in moderation. This means 1 glass a day for nonpregnant women and 2 glasses a day for men. A glass of wine equals 5 oz (150 mL). What foods should I eat? Fruits Apples. Apricots. Avocado. Berries. Bananas.  Cherries. Dates. Figs. Grapes. Lemons. Melon. Oranges. Peaches. Plums. Pomegranate. Vegetables Artichokes. Beets. Broccoli. Cabbage. Carrots. Eggplant. Green beans. Chard. Kale. Spinach. Onions. Leeks. Peas. Squash. Tomatoes. Peppers. Radishes. Grains Whole-grain pasta. Brown rice. Bulgur wheat. Polenta. Couscous. Whole-wheat bread. [place]. Meats and other proteins Beans. Almonds. Sunflower seeds. Pine nuts. Peanuts. [place]. Salmon. Scallops. Shrimp. [place]. Tilapia. Clams. Oysters. Eggs. Poultry without skin. Dairy Low-fat milk. Cheese. Greek yogurt. Fats and oils Extra-virgin olive oil. Avocado oil. Grapeseed oil. Beverages Water. Red wine. Herbal tea. Sweets and desserts Greek yogurt with honey. Baked apples. Poached pears. Trail mix. Seasonings and condiments Basil. Cilantro. Coriander. Cumin. Mint. Parsley. Sage. Rosemary. Tarragon. Garlic. Oregano. Thyme. Pepper. Balsamic vinegar. Tahini. Hummus. Tomato sauce. Olives. Mushrooms. The items listed above may not be a complete list of foods and beverages you can eat. Contact a dietitian for more information. What foods should I limit? This is a list of foods that should be eaten rarely or only on special occasions. Fruits Fruit canned in syrup. Vegetables Deep-fried potatoes (french fries). Grains Prepackaged pasta or rice dishes. Prepackaged cereal with added sugar. Prepackaged snacks with added sugar. Meats and other proteins Beef. Pork. Lamb. Poultry with skin. Hot dogs. Berniece Salines. Dairy Ice cream. Sour cream. Whole milk. Fats and oils Butter. Canola oil. Vegetable oil. Beef fat (tallow). Lard. Beverages Juice. Sugar-sweetened soft drinks. Beer. Liquor and spirits. Sweets and desserts Cookies. Cakes. Pies. Candy. Seasonings and condiments Mayonnaise. Pre-made sauces and marinades. The items listed above may not be a complete list of foods and beverages you should limit. Contact a dietitian for more  information. Summary The Mediterranean diet includes both food and lifestyle choices. Eat a variety of fresh fruits and vegetables, beans, nuts, seeds, and whole grains. Limit the amount of red meat and sweets that you eat. If recommended by your health care provider, drink red wine in moderation. This means 1 glass a day for nonpregnant women and 2 glasses a day for men. A glass of wine equals 5 oz (150 mL). This information is not intended to replace advice given to you by your health care provider. Make sure you discuss any questions you have with your health care provider. Document Revised: 12/27/2019 Document Reviewed: 10/24/2019 Elsevier Patient Education  [place].

## 2022-10-04 ENCOUNTER — Encounter: Payer: Self-pay | Admitting: Cardiovascular Disease

## 2022-10-04 ENCOUNTER — Ambulatory Visit: Payer: 59 | Attending: Cardiovascular Disease | Admitting: Cardiovascular Disease

## 2022-10-04 ENCOUNTER — Other Ambulatory Visit (INDEPENDENT_AMBULATORY_CARE_PROVIDER_SITE_OTHER): Payer: 59

## 2022-10-04 VITALS — BP 120/80 | HR 94 | Ht 63.0 in | Wt 184.0 lb

## 2022-10-04 DIAGNOSIS — G459 Transient cerebral ischemic attack, unspecified: Secondary | ICD-10-CM

## 2022-10-04 LAB — LIPID PANEL
Cholesterol: 260 mg/dL — ABNORMAL HIGH (ref 0–200)
HDL: 61 mg/dL (ref >39.00)
LDL Cholesterol: 176 mg/dL — ABNORMAL HIGH (ref 0–99)
NonHDL: 199.4
Total CHOL/HDL Ratio: 4
Triglycerides: 117 mg/dL (ref 0.0–149.0)
VLDL: 23.4 mg/dL (ref 0.0–40.0)

## 2022-10-04 LAB — HEMOGLOBIN A1C: Hgb A1c MFr Bld: 5.8 % (ref 4.6–6.5)

## 2022-10-04 MED ORDER — LEVOTHYROXINE SODIUM 50 MCG PO TABS: 50.0000 ug | ORAL_TABLET | Freq: Every day | ORAL | 3 refills | Status: DC

## 2022-10-04 NOTE — Progress Notes (Signed)
Cardiology Office Note:    Date:  10/04/2022   ID:  Megan Davila, DOB 1966/05/08, MRN [id number]  PCP:  Carol Ada, [place] Prov[facility] Cardiologist:  Marrian Bells     Referring MD: Pieter Partridge, DO   Chief Complaint  Patient presents with   Cerebrovascular Accident    History of Present Illness:    Megan Davila is a 56 y.o. female with a hx of TIA . , hypothyroidism,   obesity, s/p gastric bypass surgery  Down 90 lbs from her largest  weight  ( Got as low as 100 lbs )   Several weeks ago.  Was on her way to a doctors appt Put the dog in the kennel Couldn't talk,  was not able to walk in a straight line  Speech was altered   Went to the ER , CT scan of brain was normal  MRI of the brain looks normal    Went back to there ER this past Friday ( 4 days ago) with "feeling bad"   - shortness of breath, HTN (  160 / 112) Troponin levels were negative After waiting 9 hours in the ER,  she was having lots of back pain and she left   Hx of hypothyroidism - is not on her levothyroxine   Hx of heart murmur   Occasional palpitations  Exercised some,  walks the dog  Still having some speech abn.  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   + family hx of strokes    Past Medical History:  Diagnosis Date   Abnormal Pap smear    Anemia    Anxiety    Arthritis    neck   Broken toe 12-22-15   middle toe right foot   Cataract of both eyes    Depression    GERD (gastroesophageal reflux disease)    Headache(784.0)    Heart murmur    History of hiatal hernia 2021   small   History of kidney stones    Hypothyroidism    Intestinal malabsorption following gastrectomy 06/19/2015   Migraine    Perforated bowel ([place])    Pernicious anemia 06/19/2015   PONV (postoperative nausea and vomiting)    Thyroid disease    hypothyrodism    Past Surgical History:  Procedure Laterality Date   CATARACT EXTRACTION, BILATERAL Bilateral 02/2016   CERVICAL BIOPSY   W/ LOOP ELECTRODE EXCISION  11/2013, 05/2018, 11/2019   LGSIL, negative for dysplasia, LGSIL and positive ECC with potential LGSIL  - respectively   CERVIX LESION DESTRUCTION  1993   CIN III, [facility], recurrence--YAG laser   CESAREAN SECTION  2008   CHOLECYSTECTOMY     COLONOSCOPY  10/2020   CYSTOSCOPY N/A 10/26/2020   Procedure: CYSTOSCOPY;  Surgeon: Nunzio Cobbs, MD;  Location[facility];  Service: Gynecology;  Laterality: N/A;   GASTRIC BYPASS  2002   HYSTEROSCOPY WITH D & C  10/14/2011   Procedure: DILATATION AND CURETTAGE (D&C) /HYSTEROSCOPY;  Surgeon: Arloa Koh;  Location: [facility];  Service: Gynecology;  Laterality: Bilateral;   LASIK     NOSE SURGERY     STOMACH SURGERY  04/20/2015   dilation of "connection between stomach and intestine", [facility]   TONSILLECTOMY     TONSILLECTOMY     TOTAL LAPAROSCOPIC HYSTERECTOMY WITH BILATERAL SALPINGO OOPHORECTOMY N/A 10/26/2020   Procedure: TOTAL LAPAROSCOPIC HYSTERECTOMY WITH BILATERAL SALPINGECTOMY;  Surgeon: Yisroel Ramming, Everardo All,  MD;  Location: [facility];  Service: Gynecology;  Laterality: N/A;   TUBAL LIGATION  2008    Current Medications: Current Meds  Medication Sig   acetaminophen (TYLENOL) 325 MG tablet 1 tablet as needed   ALPRAZolam (XANAX) 1 MG tablet Take 1 mg by mouth 2 (two) times daily as needed. Anxiety   APLENZIN 348 MG TB24 Take 1 tablet by mouth every morning.   Armodafinil 250 MG tablet Take 250 mg by mouth every morning.   Botulinum Toxin Type A (BOTOX) 200 units SOLR INJECT UP TO 200 UNITS  INTRAMUSCULARLY TO HEAD,  NECK, AND SHOULDERS EVERY  12 WEEKS (GIVEN AT MD  OFFICE, DISCARD UNUSED)   cyanocobalamin (,VITAMIN B-12,) 1000 MCG/ML injection Inject 1,000 mcg into the muscle every 21 ( twenty-one) days.    diphenhydrAMINE (BENADRYL) 12.5 MG/5ML elixir Take 12.5 mg by mouth every 6 (six) hours as needed for allergies.    divalproex (DEPAKOTE) 500 MG DR tablet Take 1  tablet (500 mg total) by mouth 2 (two) times daily.   doxepin (SINEQUAN) 75 MG capsule Take by mouth.   eletriptan (RELPAX) 40 MG tablet Take 1 tablet (40 mg total) by mouth as needed for migraine or headache. May repeat in 2 hours if needed. Max 2 tabs per day or 8 tabs per month.   estradiol (VIVELLE-DOT) 0.05 MG/24HR patch APPLY 1 PATCH(0.05 MG) TOPICALLY TO THE SKIN 2 TIMES A WEEK   famotidine (PEPCID) 20 MG tablet one tab   fexofenadine (ALLEGRA) 180 MG tablet Take 180 mg by mouth daily as needed. allergies   fluticasone (FLONASE) 50 MCG/ACT nasal spray Place 1 spray into both nostrils as needed for allergies.    gabapentin (NEURONTIN) 300 MG capsule Take 1 capsule (300 mg total) by mouth 3 (three) times daily.   hydrOXYzine (ATARAX/VISTARIL) 25 MG tablet Take 25 mg by mouth every 8 (eight) hours as needed (headache).    Lasmiditan Succinate 100 MG TABS Take 100 mg by mouth as needed (headache onset).    levothyroxine (SYNTHROID) 50 MCG tablet Take 1 tablet (50 mcg total) by mouth daily before breakfast.   LINZESS 145 MCG CAPS capsule Take 145 mcg by mouth daily.   Multiple Vitamins-Minerals (MULTIVITAMIN WITH MINERALS) tablet Take 1 tablet by mouth daily.   Naltrexone-buPROPion HCl ER (CONTRAVE) 8-90 MG TB12 Take by mouth.   nystatin (MYCOSTATIN/NYSTOP) powder Apply 1 application topically daily as needed (yeast infection).   nystatin-triamcinolone (MYCOLOG II) cream Apply 1 application topically 2 (two) times daily. Apply to affected area BID for up to 7 days. (Patient taking differently: Apply 1 application  topically 2 (two) times daily as needed (rash).)   ondansetron (ZOFRAN) 4 MG tablet Take 4 mg by mouth every 8 (eight) hours as needed for nausea or vomiting.    promethazine (PHENERGAN) 25 MG suppository Place 25 mg rectally every 6 (six) hours as needed for nausea or vomiting.    temazepam (RESTORIL) 30 MG capsule Take by mouth.   thiamine (VITAMIN B-1) 50 MG tablet Take 50 mg by  mouth daily.    TRINTELLIX 20 MG TABS tablet Take 20 mg by mouth daily.   zolmitriptan (ZOMIG) 5 MG nasal solution Place into the nose.   zonisamide (ZONEGRAN) 100 MG capsule TAKE 2 CAPSULES BY MOUTH EVERY NIGHT AT BEDTIME. Take along w/ one of the '50mg'$  caps for total daily dose of '250mg'$    [DISCONTINUED] levothyroxine (SYNTHROID) 50 MCG tablet TAKE 1 TABLET BY MOUTH EVERY DAY 30  MINUTES BEFORE EATING     Allergies:   Naproxen, Amoxicillin-pot clavulanate, Monistat [miconazole], Sulfa antibiotics, and Tioconazole   Social History   Socioeconomic History   Marital status: Divorced    Spouse name: Not on file   Number of children: 1   Years of education: College   Highest education level: Not on file  Occupational History    Employer: [employer]  Tobacco Use   Smoking status: Never   Smokeless tobacco: Never  Vaping Use   Vaping Use: Never used  Substance and Sexual Activity   Alcohol use: Not Currently    Comment: Rarely   Drug use: No   Sexual activity: Yes    Partners: Male    Birth control/protection: Surgical    Comment: tubal/Hyst  Other Topics Concern   Not on file  Social History Narrative   Pt lives at home with her family.   Caffeine Use: once daily   Social Determinants of Health   Financial Resource Strain: Not on file  Food Insecurity: Not on file  Transportation Needs: Not on file  Physical Activity: Not on file  Stress: Not on file  Social Connections: Not on file     Family History: The patient's family history includes Dementia in her father; Diabetes in her mother and another family member; Heart disease in her father and another family member; Hypertension in her father, maternal grandmother, and paternal grandmother; Psychiatric Illness in her father; Social phobia in her father; Stroke in her father, paternal grandfather, and another family member; Thyroid disease in her maternal grandmother. There is no history of Breast cancer.  ROS:    Please see the history of present illness.     All other systems reviewed and are negative.  EKGs/Labs/Other Studies Reviewed:    The following studies were reviewed today:   EKG:   Recent Labs: 09/23/2022: ALT 39 09/30/2022: BUN 12; Creatinine, Ser 1.04; Hemoglobin 14.1; Platelets 314; Potassium 4.0; Sodium 138  Recent Lipid Panel    Component Value Date/Time   CHOL 161 04/22/2019 1516   TRIG 90 04/22/2019 1516   HDL 56 04/22/2019 1516   CHOLHDL 2.9 04/22/2019 1516   CHOLHDL 3.1 12/29/2016 1652   VLDL 13 12/29/2016 1652   LDLCALC 87 04/22/2019 1516     Risk Assessment/Calculations:                Physical Exam:    VS:  BP 120/80 Comment: Dinamapp 130/97  Pulse 94 Comment: Dinamapp; 88  Ht '5\' 3"'$  (1.6 m)   Wt 184 lb (83.5 kg)   LMP 03/08/2019 (Exact Date)   SpO2 99%   BMI 32.59 kg/m     Wt Readings from Last 3 Encounters:  10/04/22 184 lb (83.5 kg)  10/03/22 184 lb 6.4 oz (83.6 kg)  09/23/22 186 lb (84.4 kg)     GEN:  Well nourished, well developed in no acute distress HEENT: Normal NECK: No JVD; No carotid bruits LYMPHATICS: No lymphadenopathy CARDIAC: RRR,   very soft systolic murmur  RESPIRATORY:  Clear to auscultation without rales, wheezing or rhonchi  ABDOMEN: Soft, non-tender, non-distended MUSCULOSKELETAL:  No edema; No deformity  SKIN: Warm and dry NEUROLOGIC:  Alert and oriented x 3 PSYCHIATRIC:  Normal affect   ASSESSMENT:    1. TIA (transient ischemic attack)    PLAN:    In order of problems listed above:  TIA :  Megan Davila presents with a TIA.   Characterized by slurred speech, some facial droop,  inability to walk a straight line.  CT scan in the ER was okay.  MRI of the brain showed no evidence of stroke.  She continues to have some speech abnormalities and also some instability. She is scheduled to have an echocardiogram with bubble study.  She is also scheduled for 14-day event monitor.  We discussed the possible need for a  transesophageal echo.  I will see her again in 4 to 6 weeks for follow-up visit.  We will discuss the TEE at that time.  2.  Hypothyroidism: She has a history of hypothyroidism and was started on Synthroid.  She could not afford brand-name Synthroid.  We have called her and levothyroxine 50 mcg a day as a Megan.  Gave her several refills but she will need to follow-up with her primary medical doctor or endocrinologist for further refills.           Medication Adjustments/Labs and Tests Ordered: Current medicines are reviewed at length with the patient today.  Concerns regarding medicines are outlined above.  No orders of the defined types were placed in this encounter.  Meds ordered this encounter  Medications   levothyroxine (SYNTHROID) 50 MCG tablet    Sig: Take 1 tablet (50 mcg total) by mouth daily before breakfast.    Dispense:  30 tablet    Refill:  3    Patient Instructions  Medication Instructions:  START Levothyroxine 68mg daily (sent in 3 refills) *If you need a refill on your cardiac medications before your next appointment, please call your pharmacy*   Lab Work: NONE If you have labs (blood work) drawn today and your tests are completely normal, you will receive your results only by: MyChart Message (if you have MyChart) OR A paper copy in the mail If you have any lab test that is abnormal or we need to change your treatment, we will call you to review the results.   Testing/Procedures: NONE   Follow-Up: [facility] you and your health needs are our priority.  As part of our continuing mission to provide you with exceptional heart care, we have created designated Provider Care Teams.  These Care Teams include your primary Cardiologist (physician) and Advanced Practice Providers (APPs -  Physician Assistants and Nurse Practitioners) who all work together to provide you with the care you need, when you need it.  Your next appointment:   4-6  weeks  The format for your next appointment:   In Person  Provider:   PMertie Moores MD    Important Information About Sugar         Signed, PMertie Moores MD  10/04/2022 10:09 AM    [facility]

## 2022-10-04 NOTE — Patient Instructions (Signed)
Medication Instructions:  START Levothyroxine 56mg daily (sent in 3 refills) *If you need a refill on your cardiac medications before your next appointment, please call your pharmacy*   Lab Work: NONE If you have labs (blood work) drawn today and your tests are completely normal, you will receive your results only by: [place](if you have MyChart) OR A paper copy in the mail If you have any lab test that is abnormal or we need to change your treatment, we will call you to review the results.   Testing/Procedures: NONE   Follow-Up: [facility] you and your [facility] needs are our [facility].  As part of our continuing mission to provide you with exceptional heart care, we have created designated Provider Care Teams.  These Care Teams include your primary Cardiologist (physician) and Advanced Practice Providers (APPs -  Physician Assistants and Nurse Practitioners) who all work together to provide you with the care you need, when you need it.  Your next appointment:   4-6 weeks  The format for your next appointment:   In Person  Provider:   PMertie Moores MD    Important Information About Sugar

## 2022-10-05 ENCOUNTER — Other Ambulatory Visit: Payer: Self-pay

## 2022-10-05 MED ORDER — ATORVASTATIN CALCIUM 80 MG PO TABS: 80.0000 mg | ORAL_TABLET | Freq: Every day | ORAL | 0 refills | Status: DC

## 2022-10-13 ENCOUNTER — Telehealth: Payer: Self-pay | Admitting: Neurology

## 2022-10-13 NOTE — Telephone Encounter (Signed)
Based on my recent evaluation and exam, there is no neurological indication to be kept out of work .  Patient advised.

## 2022-10-13 NOTE — Telephone Encounter (Signed)
Pt called in stating her PCP has written her out of work until 10/14/22. She told her that after that her Neurologist would have to be the one that would extend it. She is going to have her job send [place] the paperwork to extend it. She doesn't feel comfortable going back to work. She is still having a lot of issues.

## 2022-10-13 NOTE — Telephone Encounter (Signed)
Disability form(s) received from [facility]. Placed in Dr. Georgie Chard inbox.

## 2022-10-14 ENCOUNTER — Ambulatory Visit (HOSPITAL_COMMUNITY): Payer: 59 | Attending: Neurology

## 2022-10-14 DIAGNOSIS — G459 Transient cerebral ischemic attack, unspecified: Secondary | ICD-10-CM | POA: Diagnosis not present

## 2022-10-14 DIAGNOSIS — R000 Tachycardia, unspecified: Secondary | ICD-10-CM | POA: Diagnosis not present

## 2022-10-14 DIAGNOSIS — R42 Dizziness and giddiness: Secondary | ICD-10-CM

## 2022-10-14 LAB — ECHOCARDIOGRAM COMPLETE BUBBLE STUDY
Area-P 1/2: 4.31 cm2
S' Lateral: 2.6 cm

## 2022-10-14 MED ORDER — PERFLUTREN LIPID MICROSPHERE
1.0000 mL | INTRAVENOUS | Status: AC | PRN
  Administered 2022-10-14: 1 mL via INTRAVENOUS

## 2022-10-14 MED ORDER — SODIUM CHLORIDE 0.9% FLUSH
10.0000 mL | INTRAVENOUS | Status: DC | PRN
  Administered 2022-10-14: 20 mL via INTRAVENOUS

## 2022-10-17 ENCOUNTER — Telehealth: Payer: Self-pay | Admitting: Neurology

## 2022-10-17 NOTE — Telephone Encounter (Signed)
[place] Disability forms received this morning. Call to patient to inform form was received.   Patient stated she is not ready to go back to work yet.   Placing in Dr. Georgie Chard inbox to determine if can be completed.

## 2022-10-17 NOTE — Telephone Encounter (Signed)
Per patient she will like a note stating she can go back to work.  Advised patient if the PCP only wrote you out til last Friday you be able to go back today. Or they should be the one to write the note Releasing you back to work.

## 2022-10-17 NOTE — Telephone Encounter (Signed)
Patient to please keep wearing the heart monitor as order. It may pick up something the Echo may have missed.

## 2022-10-17 NOTE — Telephone Encounter (Signed)
Patient requested a call back, she has questions about the heart monitor she is wearing.

## 2022-10-24 ENCOUNTER — Encounter: Payer: Self-pay | Admitting: Orthopedic Surgery

## 2022-10-24 ENCOUNTER — Ambulatory Visit: Payer: 59 | Admitting: Orthopedic Surgery

## 2022-10-24 VITALS — BP 129/83 | HR 94 | Ht 63.0 in | Wt 184.0 lb

## 2022-10-24 DIAGNOSIS — M5416 Radiculopathy, lumbar region: Secondary | ICD-10-CM | POA: Diagnosis not present

## 2022-10-24 NOTE — Progress Notes (Signed)
Orthopedic Spine Surgery Office Note  Assessment: Patient is a 56 y.o. female with radicular type pain and a positive straight leg raise on the right. She seems to be radiculopathy in a S1 distribution.    Plan: -Patient has tried conservative treatment for over 6 weeks now and is noting some relief in her symptoms so we will continue with the gabapentin and activity modification -I told her to call the office if she notices new or worsening of her symptoms at which time I would get an MRI of the lumbar spine -Patient should return to office on an as needed basis   Patient expressed understanding of the plan and all questions were answered to the patient's satisfaction.   ___________________________________________________________________________  History: Patient is a 56 y.o. female who has been previously seen in the office for symptoms consistent with lumbar radiculopathy. Since the last visit, symptom intensity has become less severe. At the last visit, physical therapy and gabapentin was tried for treatment. Pain still radiates down the posterior aspect of the right leg but really only happens when she sits for a prolonged period of time. As long as she does not do that, she is okay and the pain is tolerable. Sometimes gets paresthesias and numbness in the same distribution if sitting for too long as well. No weakness. Denies changes in bowel or bladder habits.   Previous treatments: Tramadol, Flexeril, Medrol Dosepak, activity modification, gabapentin, physical therapy  COPY OF OLD NOTE Patient is a 56 y.o. female who presents today for lumbar spine.  Patient states that on the night of 09/13/2022 she was getting into bed up a step and noticed acute onset of lower back and right leg pain.  She has never had back issues in the past.  She has never had symptoms like this before.  There is no trauma or injury that brought on the pain.  Pain starts in her lower back and radiates down the back  of her thigh into her posterior lateral leg and into the plantar aspect of her foot.  She gets paresthesias in the same distribution as the pain.  She has no pain or paresthesias on her left lower extremity.  She notes that sitting or flexing her lumbar spine make the pain worse.  Laying down makes the pain better.     Weakness: Yes, feels her right leg is slightly weaker than her left Symptoms of imbalance: Yes, feels that her right leg is giving out from underneath her and causes her to be off balance Paresthesias and numbness: Yes, in an S1 distribution down her right leg.  None in her other extremities Bowel or bladder incontinence: Yes, has a multiyear history of urinary incontinence that she states started after her hysterectomy.  No new urinary symptoms.  No bowel incontinence. Saddle anesthesia: Denies END OF COPY  Physical Exam:  General: no acute distress, appears stated age Neurologic: alert, answering questions appropriately, following commands Respiratory: unlabored breathing on room air, symmetric chest rise Psychiatric: appropriate affect, normal cadence to speech   MSK (spine):  -Strength exam      Left  Right EHL    5/5  5/5 TA    5/5  5/5 GSC    5/5  5/5 Knee extension  5/5  5/5 Hip flexion   5/5  5/5  -Sensory exam    Sensation intact to light touch in L3-S1 nerve distributions of bilateral lower extremities  -Straight leg raise: negative  -Contralateral straight leg raise: negative -Clonus:  no beats bilaterally  Imaging: X-ray of the lumbar spine from 09/20/2022 was previously independently reviewed and interpreted, showing degenerative changes within the left hip with joint space narrowing.  No fracture or dislocation within the spine.  Decreased foraminal space at L5-S1.  No evidence of instability on flexion-extension.  No significant disc height loss.    Patient name: Megan Davila Patient MRN: [id number] Date of visit: 10/24/22

## 2022-11-07 NOTE — Progress Notes (Unsigned)
Cardiology Office Note:    Date:  11/08/2022   ID:  Megan Davila, DOB 1966-04-22, MRN [id number]  PCP:  Carol Ada, [place] [facility] Cardiologist:  Weslie Pretlow     Referring MD: Carol Ada, MD   Chief Complaint  Patient presents with   Stroke Symptoms    History of Present Illness:    Megan Davila is a 56 y.o. female with a hx of TIA . , hypothyroidism,   obesity, s/p gastric bypass surgery  Down 90 lbs from her largest  weight  ( Got as low as 100 lbs )   Several weeks ago.  Was on her way to a doctors appt Put the dog in the kennel Couldn't talk,  was not able to walk in a straight line  Speech was altered   Went to the ER , CT scan of brain was normal  MRI of the brain looks normal    Went back to there ER this past Friday ( 4 days ago) with "feeling bad"   - shortness of breath, HTN (  160 / 112) Troponin levels were negative After waiting 9 hours in the ER,  she was having lots of back pain and she left   Hx of hypothyroidism - is not on her levothyroxine   Hx of heart murmur   Occasional palpitations  Exercised some,  walks the dog  Still having some speech abn.  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   + family hx of strokes   Dec. 5, 2023  Megan Davila is seen today for follow up visit of her TIA  Echo reveals EF 60-65% Grade I DD Normal pulm pressures  Event monitor is in progress,  has been sent back   Feels the same .  Still forgetting things ,    May need some PT      Past Medical History:  Diagnosis Date   Abnormal Pap smear    Anemia    Anxiety    Arthritis    neck   Broken toe 12-22-15   middle toe right foot   Cataract of both eyes    Depression    GERD (gastroesophageal reflux disease)    Headache(784.0)    Heart murmur    History of hiatal hernia 2021   small   History of kidney stones    Hypothyroidism    Intestinal malabsorption following gastrectomy 06/19/2015   Migraine    Perforated bowel  (Toast)    Pernicious anemia 06/19/2015   PONV (postoperative nausea and vomiting)    Thyroid disease    hypothyrodism    Past Surgical History:  Procedure Laterality Date   CATARACT EXTRACTION, BILATERAL Bilateral 02/2016   CERVICAL BIOPSY  W/ LOOP ELECTRODE EXCISION  11/2013, 05/2018, 11/2019   LGSIL, negative for dysplasia, LGSIL and positive ECC with potential LGSIL  - respectively   CERVIX LESION DESTRUCTION  1993   CIN III, [facility], recurrence--YAG laser   CESAREAN SECTION  2008   CHOLECYSTECTOMY     COLONOSCOPY  10/2020   CYSTOSCOPY N/A 10/26/2020   Procedure: CYSTOSCOPY;  Surgeon: Nunzio Cobbs, MD;  Location: [facility];  Service: Gynecology;  Laterality: N/A;   GASTRIC BYPASS  2002   HYSTEROSCOPY WITH D & C  10/14/2011   Procedure: DILATATION AND CURETTAGE (D&C) /HYSTEROSCOPY;  Surgeon: Arloa Koh;  Location: [facility];  Service: Gynecology;  Laterality: Bilateral;   LASIK  NOSE SURGERY     STOMACH SURGERY  04/20/2015   dilation of "connection between stomach and intestine", [facility]   TONSILLECTOMY     TONSILLECTOMY     TOTAL LAPAROSCOPIC HYSTERECTOMY WITH BILATERAL SALPINGO OOPHORECTOMY N/A 10/26/2020   Procedure: TOTAL LAPAROSCOPIC HYSTERECTOMY WITH BILATERAL SALPINGECTOMY;  Surgeon: Nunzio Cobbs, MD;  Location: [facility];  Service: Gynecology;  Laterality: N/A;   TUBAL LIGATION  2008    Current Medications: Current Meds  Medication Sig   acetaminophen (TYLENOL) 325 MG tablet 1 tablet as needed   ALPRAZolam (XANAX) 1 MG tablet Take 1 mg by mouth 2 (two) times daily as needed. Anxiety   APLENZIN 348 MG TB24 Take 1 tablet by mouth every morning.   Armodafinil 250 MG tablet Take 250 mg by mouth every morning.   atorvastatin (LIPITOR) 80 MG tablet Take 1 tablet (80 mg total) by mouth daily.   Botulinum Toxin Type A (BOTOX) 200 units SOLR INJECT UP TO 200 UNITS  INTRAMUSCULARLY TO HEAD,  NECK, AND SHOULDERS EVERY  12  WEEKS (GIVEN AT MD  OFFICE, DISCARD UNUSED)   Calcium-Magnesium-Vitamin D 500-250-125 MG-MG-UNIT TABS 1 tablet with food Orally Three times a day   cyanocobalamin (,VITAMIN B-12,) 1000 MCG/ML injection Inject 1,000 mcg into the muscle every 21 ( twenty-one) days.    DEXILANT 60 MG capsule Take 60 mg by mouth daily.   diphenhydrAMINE (BENADRYL) 12.5 MG/5ML elixir Take 12.5 mg by mouth every 6 (six) hours as needed for allergies.    divalproex (DEPAKOTE) 500 MG DR tablet Take 1 tablet (500 mg total) by mouth 2 (two) times daily.   doxepin (SINEQUAN) 75 MG capsule Take by mouth.   eletriptan (RELPAX) 40 MG tablet Take 1 tablet (40 mg total) by mouth as needed for migraine or headache. May repeat in 2 hours if needed. Max 2 tabs per day or 8 tabs per month.   estradiol (VIVELLE-DOT) 0.05 MG/24HR patch APPLY 1 PATCH(0.05 MG) TOPICALLY TO THE SKIN 2 TIMES A WEEK   famotidine (PEPCID) 20 MG tablet one tab   fexofenadine (ALLEGRA) 180 MG tablet Take 180 mg by mouth daily as needed. allergies   fluticasone (FLONASE) 50 MCG/ACT nasal spray Place 1 spray into both nostrils as needed for allergies.    gabapentin (NEURONTIN) 300 MG capsule Take 1 capsule (300 mg total) by mouth 3 (three) times daily.   hydrOXYzine (ATARAX/VISTARIL) 25 MG tablet Take 25 mg by mouth every 8 (eight) hours as needed (headache).    lansoprazole (PREVACID) 15 MG capsule 1 capsule before a meal   Lasmiditan Succinate 100 MG TABS Take 100 mg by mouth as needed (headache onset).    levothyroxine (SYNTHROID) 50 MCG tablet Take 1 tablet (50 mcg total) by mouth daily before breakfast.   LINZESS 145 MCG CAPS capsule Take 145 mcg by mouth daily.   losartan (COZAAR) 25 MG tablet Take 25 mg by mouth daily.   Multiple Vitamin (MULTIVITAMIN) capsule 1 tablet Orally once a day   Multiple Vitamins-Minerals (MULTIVITAMIN WITH MINERALS) tablet Take 1 tablet by mouth daily.   Naltrexone-buPROPion HCl ER (CONTRAVE) 8-90 MG TB12 Take by mouth.    nystatin (MYCOSTATIN/NYSTOP) powder Apply 1 application topically daily as needed (yeast infection).   ondansetron (ZOFRAN) 4 MG tablet Take 4 mg by mouth every 8 (eight) hours as needed for nausea or vomiting.    oxybutynin (DITROPAN XL) 10 MG 24 hr tablet Take 1 tablet (10 mg total) by mouth  at bedtime.   promethazine (PHENERGAN) 25 MG suppository Place 25 mg rectally every 6 (six) hours as needed for nausea or vomiting.    ranitidine (ZANTAC) 150 MG capsule 1 capsule Orally Once a day before bedtime   temazepam (RESTORIL) 30 MG capsule Take by mouth.   thiamine (VITAMIN B-1) 50 MG tablet Take 50 mg by mouth daily.    thiamine 50 MG tablet 2 capsule Orally Once a day   TRINTELLIX 20 MG TABS tablet Take 20 mg by mouth daily.   Ubrogepant 50 MG TABS Take by mouth.   zolmitriptan (ZOMIG) 5 MG nasal solution Place into the nose.     Allergies:   Naproxen, Amoxicillin-pot clavulanate, Monistat [miconazole], Sulfa antibiotics, and Tioconazole   Social History   Socioeconomic History   Marital status: Divorced    Spouse name: Not on file   Number of children: 1   Years of education: College   Highest education level: Not on file  Occupational History    Employer: [employer]  Tobacco Use   Smoking status: Never   Smokeless tobacco: Never  Vaping Use   Vaping Use: Never used  Substance and Sexual Activity   Alcohol use: Not Currently    Comment: Rarely   Drug use: No   Sexual activity: Yes    Partners: Male    Birth control/protection: Surgical    Comment: tubal/Hyst  Other Topics Concern   Not on file  Social History Narrative   Pt lives at home with her family.   Caffeine Use: once daily   Social Determinants of Health   Financial Resource Strain: Not on file  Food Insecurity: Not on file  Transportation Needs: Not on file  Physical Activity: Not on file  Stress: Not on file  Social Connections: Not on file     Family History: The patient's family history  includes Dementia in her father; Diabetes in her mother and another family member; Heart disease in her father and another family member; Hypertension in her father, maternal grandmother, and paternal grandmother; Psychiatric Illness in her father; Social phobia in her father; Stroke in her father, paternal grandfather, and another family member; Thyroid disease in her maternal grandmother. There is no history of Breast cancer.  ROS:   Please see the history of present illness.     All other systems reviewed and are negative.  EKGs/Labs/Other Studies Reviewed:    The following studies were reviewed today:   EKG:   Recent Labs: 09/23/2022: ALT 39 09/30/2022: BUN 12; Creatinine, Ser 1.04; Hemoglobin 14.1; Platelets 314; Potassium 4.0; Sodium 138  Recent Lipid Panel    Component Value Date/Time   CHOL 260 (H) 10/04/2022 0825   CHOL 161 04/22/2019 1516   TRIG 117.0 10/04/2022 0825   HDL 61.00 10/04/2022 0825   HDL 56 04/22/2019 1516   CHOLHDL 4 10/04/2022 0825   VLDL 23.4 10/04/2022 0825   LDLCALC 176 (H) 10/04/2022 0825   LDLCALC 87 04/22/2019 1516     Risk Assessment/Calculations:                Physical Exam:     Physical Exam: Blood pressure 120/88, pulse 86, height '5\' 3"'$  (1.6 m), weight 194 lb 3.2 oz (88.1 kg), last menstrual period 03/08/2019, SpO2 99 %.       GEN:  Well nourished, well developed in no acute distress HEENT: Normal NECK: No JVD; No carotid bruits LYMPHATICS: No lymphadenopathy CARDIAC: RRR , no murmurs, rubs, gallops RESPIRATORY:  Clear to auscultation without rales, wheezing or rhonchi  ABDOMEN: Soft, non-tender, non-distended MUSCULOSKELETAL:  No edema; No deformity  SKIN: Warm and dry NEUROLOGIC:  Alert and oriented x 3    ASSESSMENT:    1. Mixed hyperlipidemia   2. Primary hypertension   3. Obesity without serious comorbidity, unspecified classification, unspecified obesity type     PLAN:       TIA :    further evaluation  from neuro    2.  HTN:   advised weight loss, better diet .   3.  Chronic diastolic CHF:  has grade I DD.   BP looks good.   Advised weight loss   Will see her in 6 months             Medication Adjustments/Labs and Tests Ordered: Current medicines are reviewed at length with the patient today.  Concerns regarding medicines are outlined above.  No orders of the defined types were placed in this encounter.  No orders of the defined types were placed in this encounter.   Patient Instructions  Adopting a Healthy Lifestyle.   Weight: Know what a healthy weight is for you (roughly BMI <25) and aim to maintain this. You can calculate your body mass index on your smart phone  Diet: Aim for 7+ servings of fruits and vegetables daily Limit animal fats in diet for cholesterol and heart health - choose grass fed whenever available Avoid highly processed foods (fast food burgers, tacos, fried chicken, pizza, hot dogs, french fries)  Saturated fat comes in the form of butter, lard, coconut oil, margarine, partially hydrogenated oils, and fat in meat. These increase your risk of cardiovascular disease.  Use healthy plant oils, such as olive, canola, soy, corn, sunflower and peanut.  Whole foods such as fruits, vegetables and whole grains have fiber  Men need > 38 grams of fiber per day Women need > 25 grams of fiber per day  Load up on vegetables and fruits - one-half of your plate: Aim for color and variety, and remember that potatoes dont count. Go for whole grains - one-quarter of your plate: Whole wheat, barley, wheat berries, quinoa, oats, brown rice, and foods made with them. If you want pasta, go with whole wheat pasta. Protein power - one-quarter of your plate: Fish, chicken, beans, and nuts are all healthy, versatile protein sources. Limit red meat. You need carbohydrates for energy! The type of carbohydrate is more important than the amount. Choose carbohydrates such as vegetables,  fruits, whole grains, beans, and nuts in the place of white rice, white pasta, potatoes (baked or fried), macaroni and cheese, cakes, cookies, and donuts.  If youre thirsty, drink water. Coffee and tea are good in moderation, but skip sugary drinks and limit milk and dairy products to one or two daily servings. Keep sugar intake at 6 teaspoons or 24 grams or LESS       Exercise: Aim for 150 min of moderate intensity exercise weekly for heart health, and weights twice weekly for bone health Stay active - any steps are better than no steps! Aim for 7-9 hours of sleep daily   Medication Instructions:  Your physician recommends that you continue on your current medications as directed. Please refer to the Current Medication list given to you today.  *If you need a refill on your cardiac medications before your next appointment, please call your pharmacy*   Lab Work: NONE If you have labs (blood work) drawn today and your  tests are completely normal, you will receive your results only by: MyChart Message (if you have MyChart) OR A paper copy in the mail If you have any lab test that is abnormal or we need to change your treatment, we will call you to review the results.   Testing/Procedures: NONE   Follow-Up: At [facility], you and your health needs are our priority.  As part of our continuing mission to provide you with exceptional heart care, we have created designated Provider Care Teams.  These Care Teams include your primary Cardiologist (physician) and Advanced Practice Providers (APPs -  Physician Assistants and Nurse Practitioners) who all work together to provide you with the care you need, when you need it.  We recommend signing up for the patient portal called "MyChart".  Sign up information is provided on this After Visit Summary.  MyChart is used to connect with patients for Virtual Visits (Telemedicine).  Patients are able to view lab/test results, encounter  notes, upcoming appointments, etc.  Non-urgent messages can be sent to your provider as well.   To learn more about what you can do with MyChart, go to [email or web address].    Your next appointment:   6 month(s)  The format for your next appointment:   In Person  Provider:   Mertie Moores, MD        Important Information About Sugar            Signed, Mertie Moores, MD  11/08/2022 2:29 PM    [place]

## 2022-11-08 ENCOUNTER — Ambulatory Visit: Payer: 59 | Attending: Cardiovascular Disease | Admitting: Cardiovascular Disease

## 2022-11-08 ENCOUNTER — Encounter: Payer: Self-pay | Admitting: Cardiovascular Disease

## 2022-11-08 VITALS — BP 120/88 | HR 86 | Ht 63.0 in | Wt 194.2 lb

## 2022-11-08 DIAGNOSIS — E669 Obesity, unspecified: Secondary | ICD-10-CM | POA: Diagnosis not present

## 2022-11-08 DIAGNOSIS — E782 Mixed hyperlipidemia: Secondary | ICD-10-CM | POA: Diagnosis not present

## 2022-11-08 DIAGNOSIS — I10 Essential (primary) hypertension: Secondary | ICD-10-CM | POA: Diagnosis not present

## 2022-11-08 NOTE — Patient Instructions (Addendum)
Adopting a Healthy Lifestyle.   Weight: Know what a healthy weight is for you (roughly BMI <25) and aim to maintain this. You can calculate your body mass index on your smart phone  Diet: Aim for 7+ servings of fruits and vegetables daily Limit animal fats in diet for cholesterol and heart health - choose grass fed whenever available Avoid highly processed foods (fast food burgers, tacos, fried chicken, pizza, hot dogs, french fries)  Saturated fat comes in the form of butter, lard, coconut oil, margarine, partially hydrogenated oils, and fat in meat. These increase your risk of cardiovascular disease.  Use healthy plant oils, such as olive, canola, soy, corn, sunflower and peanut.  Whole foods such as fruits, vegetables and whole grains have fiber  Men need > 38 grams of fiber per day Women need > 25 grams of fiber per day  Load up on vegetables and fruits - one-half of your plate: Aim for color and variety, and remember that potatoes dont count. Go for whole grains - one-quarter of your plate: Whole wheat, barley, wheat berries, quinoa, oats, brown rice, and foods made with them. If you want pasta, go with whole wheat pasta. Protein power - one-quarter of your plate: Fish, chicken, beans, and nuts are all healthy, versatile protein sources. Limit red meat. You need carbohydrates for energy! The type of carbohydrate is more important than the amount. Choose carbohydrates such as vegetables, fruits, whole grains, beans, and nuts in the place of white rice, white pasta, potatoes (baked or fried), macaroni and cheese, cakes, cookies, and donuts.  If youre thirsty, drink water. Coffee and tea are good in moderation, but skip sugary drinks and limit milk and dairy products to one or two daily servings. Keep sugar intake at 6 teaspoons or 24 grams or LESS       Exercise: Aim for 150 min of moderate intensity exercise weekly for heart health, and weights twice weekly for bone health Stay active -  any steps are better than no steps! Aim for 7-9 hours of sleep daily   Medication Instructions:  Your physician recommends that you continue on your current medications as directed. Please refer to the Current Medication list given to you today.  *If you need a refill on your cardiac medications before your next appointment, please call your pharmacy*   Lab Work: NONE If you have labs (blood work) drawn today and your tests are completely normal, you will receive your results only by: [place] (if you have MyChart) OR A paper copy in the mail If you have any lab test that is abnormal or we need to change your treatment, we will call you to review the results.   Testing/Procedures: NONE   Follow-Up: At [facility], you and your health needs are our priority.  As part of our continuing mission to provide you with exceptional heart care, we have created designated Provider Care Teams.  These Care Teams include your primary Cardiologist (physician) and Advanced Practice Providers (APPs -  Physician Assistants and Nurse Practitioners) who all work together to provide you with the care you need, when you need it.  We recommend signing up for the patient portal called "MyChart".  Sign up information is provided on this After Visit Summary.  MyChart is used to connect with patients for Virtual Visits (Telemedicine).  Patients are able to view lab/test results, encounter notes, upcoming appointments, etc.  Non-urgent messages can be sent to your provider as well.   To  learn more about what you can do with MyChart, go to [email or web address].    Your next appointment:   6 month(s)  The format for your next appointment:   In Person  Provider:   Mertie Moores, MD        Important Information About Sugar

## 2022-11-15 NOTE — Progress Notes (Signed)
Patient advised of results.

## 2022-11-25 ENCOUNTER — Ambulatory Visit
Admission: EM | Admit: 2022-11-25 | Discharge: 2022-11-25 | Disposition: A | Discharge status: Home / Self Care | Payer: 59 | Attending: Physician Assistant | Admitting: Physician Assistant

## 2022-11-25 DIAGNOSIS — Z20822 Contact with and (suspected) exposure to covid-19: Secondary | ICD-10-CM | POA: Diagnosis present

## 2022-11-25 DIAGNOSIS — J069 Acute upper respiratory infection, unspecified: Secondary | ICD-10-CM | POA: Diagnosis present

## 2022-11-25 NOTE — ED Triage Notes (Signed)
Pt presents with sore throat, generalized body aches and headache since yesterday; son just tested positive for covid a few days ago.

## 2022-11-25 NOTE — ED Provider Notes (Signed)
[facility]    CSN: [id number] Arrival date & time: 11/25/22  0844      History   Chief Complaint Chief Complaint  Patient presents with   Sore Throat   Headache   Generalized Body Aches    HPI Megan Davila is a 56 y.o. female.   Patient here today for evaluation of sore throat, generalized body aches and headaches that started yesterday.  She has had mild cough.  She reports that her son tested positive for COVID a few days ago.  She denies any vomiting or diarrhea.  She has taken over-the-counter medicine with mild relief.  The history is provided by the patient.  Sore Throat Associated symptoms include headaches. Pertinent negatives include no abdominal pain and no shortness of breath.  Headache Associated symptoms: congestion, cough, myalgias and sore throat   Associated symptoms: no abdominal pain, no diarrhea, no ear pain, no fever, no nausea and no vomiting     Past Medical History:  Diagnosis Date   Abnormal Pap smear    Anemia    Anxiety    Arthritis    neck   Broken toe 12-22-15   middle toe right foot   Cataract of both eyes    Depression    GERD (gastroesophageal reflux disease)    Headache(784.0)    Heart murmur    History of hiatal hernia 2021   small   History of kidney stones    Hypothyroidism    Intestinal malabsorption following gastrectomy 06/19/2015   Migraine    Perforated bowel ([place])    Pernicious anemia 06/19/2015   PONV (postoperative nausea and vomiting)    Thyroid disease    hypothyrodism    Patient Active Problem List   Diagnosis Date Noted   Status post laparoscopic hysterectomy 10/26/2020   Vitamin D deficiency 05/23/2017   Anxiety 06/19/2015   Anemia, iron deficiency 06/19/2015   Pernicious anemia 06/19/2015   Intestinal malabsorption following gastrectomy 06/19/2015   Protein-calorie malnutrition, severe ([place]) 04/18/2015   Severe protein-calorie malnutrition (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    Past Surgical History:  Procedure Laterality Date   CATARACT EXTRACTION, BILATERAL Bilateral 02/2016   CERVICAL BIOPSY  W/ LOOP ELECTRODE EXCISION  11/2013, 05/2018, 11/2019   LGSIL, negative for dysplasia, LGSIL and positive ECC with potential LGSIL  - respectively   CERVIX LESION DESTRUCTION  1993   CIN III, [facility], recurrence--YAG laser   CESAREAN SECTION  2008   CHOLECYSTECTOMY     COLONOSCOPY  10/2020   CYSTOSCOPY N/A 10/26/2020   Procedure: CYSTOSCOPY;  Surgeon: Nunzio Cobbs, MD;  Location: [facility];  Service: Gynecology;  Laterality: N/A;   GASTRIC BYPASS  2002   HYSTEROSCOPY WITH D & C  10/14/2011   Procedure: DILATATION AND CURETTAGE (D&C) /HYSTEROSCOPY;  Surgeon: Arloa Koh;  Location: [facility];  Service: Gynecology;  Laterality: Bilateral;   LASIK     NOSE SURGERY     STOMACH SURGERY  04/20/2015   dilation of "connection between stomach and intestine", [facility]   TONSILLECTOMY     TONSILLECTOMY     TOTAL LAPAROSCOPIC HYSTERECTOMY WITH BILATERAL SALPINGO OOPHORECTOMY N/A 10/26/2020   Procedure: TOTAL LAPAROSCOPIC HYSTERECTOMY WITH BILATERAL SALPINGECTOMY;  Surgeon: Nunzio Cobbs, MD;  Location: [facility];  Service: Gynecology;  Laterality: N/A;   TUBAL LIGATION  2008    OB History     Gravida  1   Para  1   Term      Preterm      AB      Living  1      SAB      IAB      Ectopic      Multiple  1   Live Births               Home Medications    Prior to Admission medications   Medication Sig Start Date End Date Taking? Authorizing Provider  acetaminophen (TYLENOL) 325 MG tablet 1 tablet as needed     [provider]  ALPRAZolam (XANAX) 1 MG tablet Take 1 mg by mouth 2 (two) times daily as needed. Anxiety    [provider]  APLENZIN 348 MG TB24 Take 1 tablet by mouth every morning. 06/30/22   [provider]  Armodafinil 250 MG tablet Take 250 mg by mouth every morning. 05/24/18   [provider]  atorvastatin (LIPITOR) 80 MG tablet Take 1 tablet (80 mg total) by mouth daily. 10/05/22   Metta Clines R, DO  Botulinum Toxin Type A (BOTOX) 200 units SOLR INJECT UP TO 200 UNITS  INTRAMUSCULARLY TO HEAD,  NECK, AND SHOULDERS EVERY  12 WEEKS (GIVEN AT MD  OFFICE, DISCARD UNUSED) 01/01/21   [provider]  Calcium-Magnesium-Vitamin D [phone] MG-MG-UNIT TABS 1 tablet with food Orally Three times a day 08/06/15   [provider]  cyanocobalamin (,VITAMIN B-12,) 1000 MCG/ML injection Inject 1,000 mcg into the muscle every 21 ( twenty-one) days.  06/03/15   [provider]  DEXILANT 60 MG capsule Take 60 mg by mouth daily. 02/25/15   [provider]  diphenhydrAMINE (BENADRYL) 12.5 MG/5ML elixir Take 12.5 mg by mouth every 6 (six) hours as needed for allergies.     [provider]  divalproex (DEPAKOTE) 500 MG DR tablet Take 1 tablet (500 mg total) by mouth 2 (two) times daily. 04/23/18   Penumalli, Earlean Polka, MD  doxepin (SINEQUAN) 75 MG capsule Take 75 mg by mouth at bedtime. 07/09/19   [provider]  doxepin (SINEQUAN) 75 MG capsule Take by mouth. 01/22/20   [provider]  eletriptan (RELPAX) 40 MG tablet Take 1 tablet (40 mg total) by mouth as needed for migraine or headache. May repeat in 2 hours if needed. Max 2 tabs per day or 8 tabs per month. 04/23/18   Penumalli, Earlean Polka, MD  Estradiol (VAGIFEM) 10 MCG TABS vaginal tablet Place 1 tablet (10 mcg total) vaginally 2 (two) times a week. Use every night before bed for two weeks when you first begin this medicine, then after the first two weeks, begin using it  twice a week. 08/01/22   Yisroel Ramming, Everardo All, MD  estradiol (VIVELLE-DOT) 0.05 MG/24HR patch APPLY 1 PATCH(0.05 MG) TOPICALLY TO THE SKIN 2 TIMES A WEEK 08/01/22   Nunzio Cobbs, MD  famotidine (PEPCID) 20 MG tablet one tab 06/09/20   [provider]  fexofenadine (ALLEGRA) 180 MG tablet Take 180 mg by mouth daily as needed. allergies    [provider]  fluticasone (FLONASE) 50 MCG/ACT nasal spray Place 1 spray into both nostrils as needed for allergies.  01/17/17   [provider]  gabapentin (NEURONTIN) 300 MG capsule Take 1 capsule (300 mg total) by mouth 3 (three) times daily. 09/20/22   Callie Fielding, MD  hydrOXYzine (ATARAX/VISTARIL) 25 MG tablet Take 25 mg by mouth every 8 (eight) hours as needed (headache).  10/04/18   [provider]  lansoprazole (PREVACID) 15 MG capsule 1 capsule before a meal    [provider]  Lasmiditan Succinate 100 MG TABS Take 100 mg by mouth as needed (headache onset).  11/26/19   [provider]  levothyroxine (SYNTHROID) 50 MCG tablet Take 1 tablet (50 mcg total) by mouth daily before breakfast. 10/04/22   Nahser, Wonda Cheng, MD  LINZESS 145 MCG CAPS capsule Take 145 mcg by mouth daily. 09/03/18   [provider]  losartan (COZAAR) 25 MG tablet Take 25 mg by mouth daily. 11/01/22   [provider]  Multiple Vitamin (MULTIVITAMIN) capsule 1 tablet Orally once a day    [provider]  Multiple Vitamins-Minerals (MULTIVITAMIN WITH MINERALS) tablet Take 1 tablet by mouth daily.    [provider]  Naltrexone-buPROPion HCl ER (CONTRAVE) 8-90 MG TB12 Take by mouth. 01/23/20   [provider]  nystatin (MYCOSTATIN/NYSTOP) powder Apply 1 application topically daily as needed (yeast infection). 07/28/21   Nunzio Cobbs, MD  nystatin-triamcinolone (MYCOLOG II) cream Apply 1 application topically 2 (two) times daily. Apply to affected area BID for up  to 7 days. Patient taking differently: Apply 1 application  topically 2 (two) times daily as needed (rash). 04/22/20   Nunzio Cobbs, MD  ondansetron (ZOFRAN) 4 MG tablet Take 4 mg by mouth every 8 (eight) hours as needed for nausea or vomiting.  10/22/19   [provider]  oxybutynin (DITROPAN XL) 10 MG 24 hr tablet Take 1 tablet (10 mg total) by mouth at bedtime. 08/01/22   Nunzio Cobbs, MD  promethazine (PHENERGAN) 25 MG suppository Place 25 mg rectally every 6 (six) hours as needed for nausea or vomiting.  01/10/20   [provider]  ranitidine (ZANTAC) 150 MG capsule 1 capsule Orally Once a day before bedtime 02/25/15   [provider]  temazepam (RESTORIL) 30 MG capsule Take by mouth. 05/01/21   [provider]  thiamine (VITAMIN B-1) 50 MG tablet Take 50 mg by mouth daily.  08/07/15   [provider]  thiamine 50 MG tablet 2 capsule Orally Once a day 08/06/15   [provider]  tiZANidine (ZANAFLEX) 4 MG tablet Take 2-4 mg by mouth at bedtime. 05/22/18   [provider]  TRINTELLIX 20 MG TABS tablet Take 20 mg by mouth daily. 06/30/22   [provider]  Ubrogepant 50 MG TABS Take by mouth. 06/30/22   [provider]  venlafaxine XR (EFFEXOR-XR) 150 MG 24 hr capsule Take 2 capsules by mouth every morning. 06/21/22   [provider]  zolmitriptan (ZOMIG) 5 MG nasal solution Place into the nose.    [provider]  zonisamide (ZONEGRAN) 100 MG capsule TAKE 2 CAPSULES BY MOUTH EVERY NIGHT AT BEDTIME. Take along w/ one of the '50mg'$  caps for total daily dose of '250mg'$  11/25/20   [provider]    Family History Family History  Problem Relation Age of Onset   Diabetes Mother    Heart disease Father    Social phobia Father    Psychiatric Illness Father    Hypertension Father    Stroke Father  Dementia Father    Heart disease Other    Diabetes Other    Stroke Other     Hypertension Maternal Grandmother    Thyroid disease Maternal Grandmother    Hypertension Paternal Grandmother    Stroke Paternal Grandfather    Breast cancer Neg Hx     Social History Social History   Tobacco Use   Smoking status: Never   Smokeless tobacco: Never  Vaping Use   Vaping Use: Never used  Substance Use Topics   Alcohol use: Not Currently    Comment: Rarely   Drug use: No     Allergies   Naproxen, Amoxicillin-pot clavulanate, Monistat [miconazole], Sulfa antibiotics, and Tioconazole   Review of Systems Review of Systems  Constitutional:  Negative for chills and fever.  HENT:  Positive for congestion and sore throat. Negative for ear pain.   Eyes:  Negative for discharge and redness.  Respiratory:  Positive for cough. Negative for shortness of breath and wheezing.   Gastrointestinal:  Negative for abdominal pain, diarrhea, nausea and vomiting.  Musculoskeletal:  Positive for myalgias.  Neurological:  Positive for headaches.     Physical Exam Triage Vital Signs ED Triage Vitals  Enc Vitals Group     BP 11/25/22 1011 130/89     Pulse Rate 11/25/22 1011 66     Resp 11/25/22 1011 17     Temp 11/25/22 1011 97.6 F (36.4 C)     Temp Source 11/25/22 1011 Oral     SpO2 11/25/22 1011 98 %     Weight --      Height --      Head Circumference --      Peak Flow --      Pain Score 11/25/22 1009 6     Pain Loc --      Pain Edu? --      Excl. in [place]? --    No data found.  Updated Vital Signs BP 130/89 (BP Location: Left Arm)   Pulse 66   Temp 97.6 F (36.4 C) (Oral)   Resp 17   LMP 03/08/2019 (Exact Date)   SpO2 98%      Physical Exam Vitals and nursing note reviewed.  Constitutional:      General: She is not in acute distress.    Appearance: Normal appearance. She is not ill-appearing.  HENT:     Head: Normocephalic and atraumatic.     Nose: Congestion present.     Mouth/Throat:     Mouth: Mucous membranes are moist.     Pharynx: No  oropharyngeal exudate or posterior oropharyngeal erythema.  Eyes:     Conjunctiva/sclera: Conjunctivae normal.  Cardiovascular:     Rate and Rhythm: Normal rate and regular rhythm.     Heart sounds: Normal heart sounds. No murmur heard. Pulmonary:     Effort: Pulmonary effort is normal. No respiratory distress.     Breath sounds: Normal breath sounds. No wheezing, rhonchi or rales.  Skin:    General: Skin is warm and dry.  Neurological:     Mental Status: She is alert.  Psychiatric:        Mood and Affect: Mood normal.        Thought Content: Thought content normal.      UC Treatments / Results  Labs (all labs ordered are listed, but only abnormal results are displayed) Labs Reviewed  SARS CORONAVIRUS 2 (TAT 6-24 HRS)    EKG   Radiology No results found.  Procedures Procedures (including critical care time)  Medications Ordered in UC Medications - No data to display  Initial Impression / Assessment and Plan / UC Course  I have reviewed the triage vital signs and the nursing notes.  Pertinent labs & imaging results that were available during my care of the patient were reviewed by me and considered in my medical decision making (see chart for details).    Will order COVID screening.  Recommended symptomatic treatment, increase fluids and rest.  Encouraged follow-up with any worsening symptoms or further concerns.  Final Clinical Impressions(s) / UC Diagnoses   Final diagnoses:  Acute upper respiratory infection  Close exposure to COVID-19 virus   Discharge Instructions   None    ED Prescriptions   None    PDMP not reviewed this encounter.   Francene Finders, PA-C 11/25/22 [phone]

## 2022-11-26 LAB — SARS CORONAVIRUS 2 (TAT 6-24 HRS): SARS Coronavirus 2: NEGATIVE

## 2022-12-03 ENCOUNTER — Other Ambulatory Visit: Payer: Self-pay | Admitting: Obstetrics and Gynecology

## 2022-12-14 ENCOUNTER — Ambulatory Visit: Payer: 59 | Admitting: Neurology

## 2022-12-23 ENCOUNTER — Ambulatory Visit: Payer: Self-pay | Admitting: Neurology

## 2022-12-23 ENCOUNTER — Encounter: Payer: Self-pay | Admitting: Neurology

## 2022-12-23 ENCOUNTER — Telehealth: Payer: Self-pay | Admitting: Neurology

## 2022-12-23 VITALS — BP 142/91 | HR 81 | Ht 63.0 in | Wt 195.0 lb

## 2022-12-23 DIAGNOSIS — G43709 Chronic migraine without aura, not intractable, without status migrainosus: Secondary | ICD-10-CM

## 2022-12-23 NOTE — Patient Instructions (Addendum)
Need to get botox Need to bring ALL medication from home and bring them here We need a long appointment to review all meds, inject botox, review everything

## 2022-12-23 NOTE — Progress Notes (Signed)
[id number]    Provider:  Dr Jaynee Eagles Requesting Provider: Carol Ada, MD Primary Care Provider:  Carol Ada, MD  CC:  migraine  HPI:  Megan Davila is a 57 y.o. female here as requested by Carol Ada, MD for migraines. She had a TIA, since then she has had memory lost, getting lost, difficulty with words, not stable. She has a very stressful job currently and she is having difficulty multitasking. Also now has high blood pressure. She went to a cardiologist and had testing.  Today patient is here and reports she has no idea what she is taking, I tried to review her medication list, she just does not know.  I think at this time this would be an inappropriate appointment not knowing what she is on.  She is late for Botox.  I discussed with patient that we will try to get her Botox transferred here, we will or have her back as soon as possible for her Botox for migraine treatment, and at that time I want her to bring every medication in her home that she is taking so that we can go through it and discuss it.  At this time I will no charge her.  We will try to get her back as soon as possible for Botox and an appointment.  Reviewed notes, labs and imaging from outside physicians, which showed:  Current and past medications: reviewed chart ANALGESICS: Tylenol, Excedrin ANTI-MIGRAINE: Zomig, Relpax, Maxalt HEART/BP: Verapamil DECONGESTANT/ANTIHISTAMINE: ANTI-NAUSEANT: Phenergan NSAIDS: Aleve, Naproxen MUSCLE RELAXANTS: Tizanidine ANTI-CONVULSANTS: Topamax, Zonegran, Depakote STEROIDS: SLEEPING PILLS/TRANQUILIZERS: ANTI-DEPRESSANTS: impramine (dry mouth), effexor, Prozac, wellbutrin HERBAL: FIBROMYALGIA: HORMONAL: OTHER: Aimovig, (helpful), Ajovy PROCEDURES FOR HEADACHES: Botox, TPI, SPG, DHE infusion   FINDINGS: Brain: No acute infarction, hemorrhage, hydrocephalus, extra-axial collection or mass lesion. A few small T2/FLAIR hyperintensities within the  white matter, nonspecific but considered within normal limits for patient age.   Vascular: Major arterial flow voids are maintained at the skull base.   Skull and upper cervical spine: Normal marrow signal.   Sinuses/Orbits: Clear sinuses.  No acute orbital findings.   Other: No mastoid effusions.   IMPRESSION: No evidence of acute intracranial abnormality.  Review of Systems: Patient complains of symptoms per HPI as well as the following symptoms migraine. Pertinent negatives and positives per HPI. All others negative.   Social History   Socioeconomic History   Marital status: Divorced    Spouse name: Not on file   Number of children: 1   Years of education: College   Highest education level: Not on file  Occupational History    Employer: [employer]  Tobacco Use   Smoking status: Never   Smokeless tobacco: Never  Vaping Use   Vaping Use: Never used  Substance and Sexual Activity   Alcohol use: Not Currently    Comment: Rarely   Drug use: No   Sexual activity: Yes    Partners: Male    Birth control/protection: Surgical    Comment: tubal/Hyst  Other Topics Concern   Not on file  Social History Narrative   Pt lives at home with her family.   Caffeine Use: once daily   Social Determinants of Health   Financial Resource Strain: Not on file  Food Insecurity: Not on file  Transportation Needs: Not on file  Physical Activity: Not on file  Stress: Not on file  Social Connections: Not on file  Intimate Partner Violence: Not on file    Family History  Problem Relation Age  of Onset   Diabetes Mother    Heart disease Father    Social phobia Father    Psychiatric Illness Father    Hypertension Father    Stroke Father    Dementia Father    Heart disease Other    Diabetes Other    Stroke Other    Hypertension Maternal Grandmother    Thyroid disease Maternal Grandmother    Hypertension Paternal Grandmother    Stroke Paternal Grandfather    Breast  cancer Neg Hx     Past Medical History:  Diagnosis Date   Abnormal Pap smear    Anemia    Anxiety    Arthritis    neck   Broken toe 12-22-15   middle toe right foot   Cataract of both eyes    Depression    GERD (gastroesophageal reflux disease)    Headache(784.0)    Heart murmur    History of hiatal hernia 2021   small   History of kidney stones    Hypothyroidism    Intestinal malabsorption following gastrectomy 06/19/2015   Migraine    Perforated bowel ([place])    Pernicious anemia 06/19/2015   PONV (postoperative nausea and vomiting)    Thyroid disease    hypothyrodism    Patient Active Problem List   Diagnosis Date Noted   Chronic migraine without aura without status migrainosus, not intractable 12/27/2022   Status post laparoscopic hysterectomy 10/26/2020   Vitamin D deficiency 05/23/2017   Anxiety 06/19/2015   Anemia, iron deficiency 06/19/2015   Pernicious anemia 06/19/2015   Intestinal malabsorption following gastrectomy 06/19/2015   Protein-calorie malnutrition, severe ([place]) 04/18/2015   Severe protein-calorie malnutrition ([place]) 04/18/2015   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   Bariatric surgery status 04/18/2015   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   GERD (gastroesophageal reflux disease) 04/17/2015   Hypokalemia 04/16/2015   Migraine with aura 05/21/2014   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    Past Surgical History:  Procedure Laterality Date   CATARACT EXTRACTION, BILATERAL Bilateral 02/2016   CERVICAL BIOPSY  W/ LOOP ELECTRODE EXCISION  11/2013, 05/2018, 11/2019   LGSIL, negative for dysplasia, LGSIL and positive ECC with potential LGSIL  - respectively   CERVIX LESION DESTRUCTION  1993   CIN III, [facility], recurrence--YAG laser   CESAREAN SECTION  2008   CHOLECYSTECTOMY     COLONOSCOPY  10/2020   CYSTOSCOPY N/A 10/26/2020   Procedure: CYSTOSCOPY;  Surgeon: Nunzio Cobbs, MD;   Location: [facility];  Service: Gynecology;  Laterality: N/A;   GASTRIC BYPASS  2002   HYSTEROSCOPY WITH D & C  10/14/2011   Procedure: DILATATION AND CURETTAGE (D&C) /HYSTEROSCOPY;  Surgeon: Arloa Koh;  Location: [facility];  Service: Gynecology;  Laterality: Bilateral;   LASIK     NOSE SURGERY     STOMACH SURGERY  04/20/2015   dilation of "connection between stomach and intestine", [facility]   TONSILLECTOMY     TONSILLECTOMY     TOTAL LAPAROSCOPIC HYSTERECTOMY WITH BILATERAL SALPINGO OOPHORECTOMY N/A 10/26/2020   Procedure: TOTAL LAPAROSCOPIC HYSTERECTOMY WITH BILATERAL SALPINGECTOMY;  Surgeon: Nunzio Cobbs, MD;  Location: [facility];  Service: Gynecology;  Laterality: N/A;   TUBAL LIGATION  2008    Current Outpatient Medications  Medication Sig Dispense Refill   acetaminophen (TYLENOL) 325 MG tablet 1 tablet as needed     ALPRAZolam (XANAX) 1 MG tablet Take 1 mg by mouth 2 (two) times  daily as needed. Anxiety     APLENZIN 348 MG TB24 Take 1 tablet by mouth every morning.     Armodafinil 250 MG tablet Take 250 mg by mouth every morning.  2   Botulinum Toxin Type A (BOTOX) 200 units SOLR INJECT UP TO 200 UNITS  INTRAMUSCULARLY TO HEAD,  NECK, AND SHOULDERS EVERY  12 WEEKS (GIVEN AT MD  OFFICE, DISCARD UNUSED)     Calcium-Magnesium-Vitamin D 500-250-125 MG-MG-UNIT TABS 1 tablet with food Orally Three times a day     cyanocobalamin (,VITAMIN B-12,) 1000 MCG/ML injection Inject 1,000 mcg into the muscle every 21 ( twenty-one) days.      DEXILANT 60 MG capsule Take 60 mg by mouth daily.     diphenhydrAMINE (BENADRYL) 12.5 MG/5ML elixir Take 12.5 mg by mouth every 6 (six) hours as needed for allergies.      divalproex (DEPAKOTE) 500 MG DR tablet Take 1 tablet (500 mg total) by mouth 2 (two) times daily. 180 tablet 4   doxepin (SINEQUAN) 75 MG capsule Take 75 mg by mouth at bedtime.     eletriptan (RELPAX) 40 MG tablet Take 1 tablet (40 mg total) by  mouth as needed for migraine or headache. May repeat in 2 hours if needed. Max 2 tabs per day or 8 tabs per month. 10 tablet 12   Estradiol (VAGIFEM) 10 MCG TABS vaginal tablet Place 1 tablet (10 mcg total) vaginally 2 (two) times a week. Use every night before bed for two weeks when you first begin this medicine, then after the first two weeks, begin using it twice a week. 34 tablet 3   estradiol (VIVELLE-DOT) 0.05 MG/24HR patch APPLY 1 PATCH(0.05 MG) TOPICALLY TO THE SKIN 2 TIMES A WEEK 24 patch 3   famotidine (PEPCID) 20 MG tablet one tab     fexofenadine (ALLEGRA) 180 MG tablet Take 180 mg by mouth daily as needed. allergies     fluticasone (FLONASE) 50 MCG/ACT nasal spray Place 1 spray into both nostrils as needed for allergies.   1   gabapentin (NEURONTIN) 300 MG capsule Take 1 capsule (300 mg total) by mouth 3 (three) times daily. 84 capsule 0   hydrOXYzine (ATARAX/VISTARIL) 25 MG tablet Take 25 mg by mouth every 8 (eight) hours as needed (headache).   2   lansoprazole (PREVACID) 15 MG capsule 1 capsule before a meal     Lasmiditan Succinate 100 MG TABS Take 100 mg by mouth as needed (headache onset).      levothyroxine (SYNTHROID) 50 MCG tablet Take 1 tablet (50 mcg total) by mouth daily before breakfast. 30 tablet 3   LINZESS 145 MCG CAPS capsule Take 145 mcg by mouth daily.  3   losartan (COZAAR) 25 MG tablet Take 25 mg by mouth daily.     Multiple Vitamin (MULTIVITAMIN) capsule 1 tablet Orally once a day     Multiple Vitamins-Minerals (MULTIVITAMIN WITH MINERALS) tablet Take 1 tablet by mouth daily.     Naltrexone-buPROPion HCl ER (CONTRAVE) 8-90 MG TB12 Take by mouth.     nystatin (MYCOSTATIN/NYSTOP) powder Apply 1 application topically daily as needed (yeast infection). 30 g 2   nystatin-triamcinolone (MYCOLOG II) cream Apply 1 application topically 2 (two) times daily. Apply to affected area BID for up to 7 days. (Patient taking differently: Apply 1 application  topically 2 (two) times  daily as needed (rash).) 60 g 0   ondansetron (ZOFRAN) 4 MG tablet Take 4 mg by mouth every 8 (eight) hours  as needed for nausea or vomiting.      oxybutynin (DITROPAN XL) 10 MG 24 hr tablet Take 1 tablet (10 mg total) by mouth at bedtime. 90 tablet 3   promethazine (PHENERGAN) 25 MG suppository Place 25 mg rectally every 6 (six) hours as needed for nausea or vomiting.      ranitidine (ZANTAC) 150 MG capsule 1 capsule Orally Once a day before bedtime     temazepam (RESTORIL) 30 MG capsule Take by mouth.     thiamine (VITAMIN B-1) 50 MG tablet Take 50 mg by mouth daily.   0   tiZANidine (ZANAFLEX) 4 MG tablet Take 2-4 mg by mouth at bedtime.  0   TRINTELLIX 20 MG TABS tablet Take 20 mg by mouth daily.     Ubrogepant 50 MG TABS Take by mouth.     venlafaxine XR (EFFEXOR-XR) 150 MG 24 hr capsule Take 2 capsules by mouth every morning.     zolmitriptan (ZOMIG) 5 MG nasal solution Place into the nose.     zonisamide (ZONEGRAN) 100 MG capsule TAKE 2 CAPSULES BY MOUTH EVERY NIGHT AT BEDTIME. Take along w/ one of the '50mg'$  caps for total daily dose of '250mg'$      atorvastatin (LIPITOR) 80 MG tablet TAKE 1 TABLET(80 MG) BY MOUTH DAILY 60 tablet 1   No current facility-administered medications for this visit.   Facility-Administered Medications Ordered in Other Visits  Medication Dose Route Frequency Provider Last Rate Last Admin   sodium chloride flush (NS) 0.9 % injection 10 mL  10 mL Intravenous PRN Tomi Likens, Adam R, DO   20 mL at 10/14/22 1335    Allergies as of 12/23/2022 - Review Complete 12/23/2022  Allergen Reaction Noted   Naproxen Anaphylaxis 09/30/2011   Amoxicillin-pot clavulanate Other (See Comments) and Rash 02/08/2021   Monistat [miconazole] Other (See Comments) 10/16/2013   Sulfa antibiotics Hives 09/30/2011   Tioconazole Itching 04/20/2015    Vitals: BP (!) 142/91   Pulse 81   Ht '5\' 3"'$  (1.6 m)   Wt 195 lb (88.5 kg)   LMP 03/08/2019 (Exact Date)   BMI 34.54 kg/m  Last Weight:   Wt Readings from Last 1 Encounters:  12/23/22 195 lb (88.5 kg)   Last Height:   Ht Readings from Last 1 Encounters:  12/23/22 '5\' 3"'$  (1.6 m)      Assessment/Plan: This is a patient referred from [facility], she is transferring from [place], she really has no idea what medication she is taking, at this time she needs Botox we will try to get her Botox transferred here so we can get that for her for her migraines, I will no charge for today and ask her when she comes back for Botox to bring all the medications in her home that she is taking so we can sit down and get a good history, she seems very tangential and I do believe she has a significant psychiatric history.  We will try to get her Botox here and have her back at this time we will no charge her.  Need to get botox Need to bring ALL medication from home and bring them here We need a long appointment to review all meds, inject botox, review everything   Cc: Carol Ada, MD,  Carol Ada, MD  Sarina Ill, MD  [facility] [address] [place], [place] [address]  Phone [phone] Fax [phone]

## 2022-12-25 ENCOUNTER — Other Ambulatory Visit: Payer: Self-pay | Admitting: Neurology

## 2022-12-26 ENCOUNTER — Other Ambulatory Visit: Payer: Self-pay | Admitting: *Deleted

## 2022-12-27 ENCOUNTER — Telehealth: Payer: Self-pay | Admitting: Neurology

## 2022-12-27 DIAGNOSIS — G43709 Chronic migraine without aura, not intractable, without status migrainosus: Secondary | ICD-10-CM | POA: Insufficient documentation

## 2022-12-27 NOTE — Telephone Encounter (Signed)
Late entry from 4 pm: I spoke with Vicente Males @ [facility]. She stated Botox was ready but needed to be switched to Dr Jaynee Eagles. I spoke with Swetha, pharmacist, and provided v.o. for Botox 200 unit vial, Inject 155 units into the muscles of the head and neck every 12 weeks. Discard remainder. Vicente Males confirmed that regarding the medication, it is approved and ready for injection by Dr Jaynee Eagles, but they do not handle any procedure authorizations. She did say the medication was approved through the medical benefit. I had previously spoken with Dr Jaynee Eagles and she said for this visit we would charge as OV. Going forward we can do formal authorization for Botox and CPT [phone]. Vicente Males said if she can reach the patient today she can likely have the Botox to [place] tomorrow, otherwise Thursday 1/25.  Order # is [id number].

## 2022-12-27 NOTE — Telephone Encounter (Signed)
Can we find out what the delivery date is so we can schedule patient for a botox appointment?  She can go to NP for botox and me for an appointment >10 days after the botox for insurance reasons.

## 2022-12-27 NOTE — Telephone Encounter (Signed)
We need to get her botox here and inject it and then set up another longer appointment to have her bring in all her meds and carefully get a good history she had no idea what she was taking. Can I have an update? thanks

## 2022-12-27 NOTE — Telephone Encounter (Signed)
Ana from [facility] called stating that she was able to reach the pt and her delivery date has been set. Any questions please call [phone]

## 2022-12-28 NOTE — Telephone Encounter (Signed)
Spoke with Dr Jaynee Eagles. Please call patient and schedule her with next available NP for Botox Judson Roch has an opening tomorrow) and then schedule an appt with Dr Jaynee Eagles for at least 10 days after (or insurance won't pay).

## 2022-12-28 NOTE — Telephone Encounter (Signed)
Received Botox from [facility], placed in fridge.

## 2022-12-28 NOTE — Telephone Encounter (Signed)
Scheduled for Botox with Judson Roch for 12/29/2022, f/u scheduled with Dr. Jaynee Eagles for 01/23/2023 at 11:30 am.

## 2022-12-29 ENCOUNTER — Ambulatory Visit (INDEPENDENT_AMBULATORY_CARE_PROVIDER_SITE_OTHER): Payer: 59 | Admitting: Neurology

## 2022-12-29 ENCOUNTER — Encounter: Payer: Self-pay | Admitting: Neurology

## 2022-12-29 VITALS — BP 137/94 | HR 90 | Ht 63.0 in | Wt 195.0 lb

## 2022-12-29 DIAGNOSIS — G43709 Chronic migraine without aura, not intractable, without status migrainosus: Secondary | ICD-10-CM

## 2022-12-29 MED ORDER — UBRELVY 100 MG PO TABS: 100.0000 mg | ORAL_TABLET | ORAL | 11 refills | Status: DC | PRN

## 2022-12-29 MED ORDER — ONABOTULINUMTOXINA 200 UNITS IJ SOLR
200.0000 [IU] | Freq: Once | INTRAMUSCULAR | Status: AC
  Administered 2022-12-29: 200 [IU] via INTRAMUSCULAR

## 2022-12-29 NOTE — Progress Notes (Signed)
Botox 200 units x 1 vial [id number] [id number] Exp-05/2025 S/P

## 2022-12-29 NOTE — Progress Notes (Signed)
   BOTOX PROCEDURE NOTE FOR MIGRAINE HEADACHE  HISTORY: Here today for Botox injection, her headaches had been under good control. She is past-due, last was around October. Was going to the [facility], it closed. 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 She takes Xanax as needed, she took 1 before coming here today. Has daily migraine headaches right now. Even with Botox, gets 15 migraines a month.   She brought a grocery bag full of medications, for migraines: gabapentin 900 mg at bedtime, Effexor XR 150 mg 2 in the AM, Zonegran 250 mg at bedtime, Relpax as needed.   Reports in between the Botox she got trigger point injections.   Description of procedure:  The patient was placed in a sitting position. The standard protocol was used for Botox as follows, with 5 units of Botox injected at each site:  -Procerus muscle, midline injection  -Corrugator muscle, bilateral injection  -Frontalis muscle, bilateral injection, with 2 sites each side, medial injection was performed in the upper one third of the frontalis muscle, in the region vertical from the medial inferior edge of the superior orbital rim. The lateral injection was again in the upper one third of the forehead vertically above the lateral limbus of the cornea, 1.5 cm lateral to the medial injection site.  -Temporalis muscle injection, 4 sites, bilaterally. The first injection was 3 cm above the tragus of the ear, second injection site was 1.5 cm to 3 cm up from the first injection site in line with the tragus of the ear. The third injection site was 1.5-3 cm forward between the first 2 injection sites. The fourth injection site was 1.5 cm posterior to the second injection site.  -Occipitalis muscle injection, 3 sites, bilaterally. The first injection was done one half way between the occipital protuberance and the tip of the mastoid process behind the ear. The second injection site was  done lateral and superior to the first, 1 fingerbreadth from the first injection. The third injection site was 1 fingerbreadth superiorly and medially from the first injection site.  -Cervical paraspinal muscle injection, 2 sites, bilateral, the first injection site was 1 cm from the midline of the cervical spine, 3 cm inferior to the lower border of the occipital protuberance. The second injection site was 1.5 cm superiorly and laterally to the first injection site.  -Trapezius muscle injection was performed at 3 sites, bilaterally. The first injection site was in the upper trapezius muscle halfway between the inflection point of the neck, and the acromion. The second injection site was one half way between the acromion and the first injection site. The third injection was done between the first injection site and the inflection point of the neck.  A 200 unit bottle of Botox was used, 155 units were injected, the rest of the Botox was wasted. The patient tolerated the procedure well, there were no complications of the above procedure.  Botox NDC 6314-9702-63 Lot number [id number] Expiration date 05/2025 SP  I reviewed her medications for migraines. I refilled her Roselyn Meier but I increased it to 100 mg. She has appointment 01/23/23 with Dr. Jaynee Eagles. She is more clear headed today than it sounds like she was last week, but she did report she got lost on the way here. She took a Xanax before coming today. A lot of stress, is a single mother, her son is 33.

## 2023-01-05 ENCOUNTER — Other Ambulatory Visit: Payer: 59

## 2023-01-16 ENCOUNTER — Ambulatory Visit: Payer: 59 | Admitting: Neurology

## 2023-01-23 ENCOUNTER — Ambulatory Visit: Payer: 59 | Admitting: Neurology

## 2023-02-14 ENCOUNTER — Telehealth: Payer: Self-pay | Admitting: Obstetrics and Gynecology

## 2023-02-14 NOTE — Telephone Encounter (Signed)
Communication received from [facility] regarding review of medications:  Memantine hydrochloride (Namenda) and Oxybutynin Chloride ER (Ditropan).  Both have anticholinergic properties.  Ditropan cannot be used if someone is taking Namenda, as it can increase confusion.   This prompted me to do a chart review.  I see that Megan Davila was diagnosed with a TIA, transient ischemic attack.   I recommend that she discontinue her use of all estrogen medications.  This includes her Estradiol transdermal patch and the vaginal estradiol tablets.  Estrogens can contribute to the risk of stroke.

## 2023-02-15 NOTE — Telephone Encounter (Signed)
I called [facility] and cancelled the estrogen prescriptions.

## 2023-02-15 NOTE — Telephone Encounter (Signed)
I spoke with patient. Patient said she does not deal with [facility].She was confused why they were contacting you.  She said she does not take the Namenda (Memantine hydrochloride) and she has no idea what it is. She was still advised of the interaction between Namenda and Ditropan resulting in confusion in the patient.  Patient was advised because of her history of TIA that she should d/c all her estrogen prescriptions/patch and vag tabs as estrogen can contribute to the risk of stroke. Patient voiced understanding.  Dr. Quincy Simmonds, do you want me to call and cancel her estrogen prescriptions at her pharmacy?

## 2023-02-15 NOTE — Telephone Encounter (Signed)
Thank you for the clarification about the Namenda.   I do recommend cancelling the prescriptions for the estrogens.

## 2023-02-21 ENCOUNTER — Ambulatory Visit: Payer: 59 | Admitting: Neurology

## 2023-03-01 ENCOUNTER — Telehealth: Payer: Self-pay | Admitting: Neurology

## 2023-03-01 ENCOUNTER — Encounter: Payer: Self-pay | Admitting: Hematology & Oncology

## 2023-03-01 ENCOUNTER — Other Ambulatory Visit (HOSPITAL_COMMUNITY): Payer: Self-pay

## 2023-03-01 NOTE — Telephone Encounter (Signed)
Auth needed before 03/23/23 Botox appt, thank you.

## 2023-03-01 NOTE — Telephone Encounter (Signed)
   Benefit Verification [id number] Submitted!

## 2023-03-03 ENCOUNTER — Encounter: Payer: Self-pay | Admitting: Hematology & Oncology

## 2023-03-03 ENCOUNTER — Other Ambulatory Visit (HOSPITAL_COMMUNITY): Payer: Self-pay

## 2023-03-03 NOTE — Telephone Encounter (Signed)
Pharmacy Patient Advocate Encounter   Received notification from [facility] that prior authorization for Botox 200 units is required/requested.   PA submitted on 03/03/2023 to (ins) [facility] via [employer] or [facility] confirmation # [id number] Status is pending

## 2023-03-08 ENCOUNTER — Encounter: Payer: Self-pay | Admitting: Obstetrics and Gynecology

## 2023-03-08 NOTE — Telephone Encounter (Signed)
    This will be Buy and Bill 

## 2023-03-08 NOTE — Telephone Encounter (Addendum)
     If PT has any questions about copay or financial responsibility they can contact the [employer] Specialist at the info on this card and they will be happy to help explain their financial expectations. This is a Risk analyst that [place] is trying.

## 2023-03-13 ENCOUNTER — Encounter: Payer: Self-pay | Admitting: Obstetrics and Gynecology

## 2023-03-23 ENCOUNTER — Ambulatory Visit: Payer: 59 | Admitting: Neurology

## 2023-03-23 ENCOUNTER — Encounter: Payer: Self-pay | Admitting: Neurology

## 2023-03-28 ENCOUNTER — Other Ambulatory Visit: Payer: Self-pay | Admitting: Cardiovascular Disease

## 2023-03-28 NOTE — Telephone Encounter (Signed)
Pt's pharmacy is requesting a refill on levothyroxine. Would Dr. Elease Hashimoto like to refill this medication? Please address

## 2023-04-05 ENCOUNTER — Ambulatory Visit: Payer: 59 | Admitting: Neurology

## 2023-04-05 NOTE — Progress Notes (Addendum)
NEUROLOGY FOLLOW UP OFFICE NOTE  LULAR ELIE [id number]  Assessment/Plan:   Possible transient ischemic attack, unclear if it may be functional- based on speech disturbance and right sided numbness, would err on side of caution.  I think her subsequent brief episode of right facial numbness likely related to anxiety Anxiety Subjective memory complaints - suspect secondary to underlying anxiety.  It would not be explained by her TIA.  MRI of brain does not reveal any significant cerebrovascular disease.  At her age, she has less than 1% chance of having Alzheimer's disease.  She does not exhibit any signs or symptoms to suggest FTD, Lewy Body disease or other parkinsonian syndrome.   Hypertension   Secondary stroke prevention as managed by PCP: ASA 81mg  daily Atorvastatin 80mg  daily.  LDL goal less than 70.  Will check lipid panel and LFTs.  Further medication management as per PCP. Normotensive blood pressure - follow up with PCP. Hgb A1c goal less than 70 Mediterranean diet Recommend following up with psychiatry to discuss other treatment options for her anxiety, such as change in medication or starting psychotherapy Follow up with me as needed.     Subjective:  CHARNEL WOLANIN is a 57 year old female with migraines, hypothyroidism, cataract who follows up for chronic migraine and TIA.  UPDATE: Advised to start ASA 81mg  daily.  Underwent TIA workup. Labs from 10/31 revealed Hgb A1c 5.8 and LDL 176.  She was started on atorvastatin 80mg  daily.  2D echo with bubble study on 10/14/2022 revealed EF 60-65% with no cardiac source of emboli.  2 week Zio monitor negative for a fib.    She reports continuing significant stress.  She works in Scientist, product/process development orders and returns.  Her employer wants her to be cross trained in many work-related responsibilities.  Blood pressure has been elevated.  She is afraid that "I am going to have another TIA".  She reports memory difficulties.   She has word-finding difficulty.  She cannot type as fast as she used to.  Prior to arriving here, she went to the wrong doctor's office.  Sometimes, she may intermittently feel right sided facial numbness.  She reports her neurologist who treated her migraines has closed her practice.  She has since established care with [facility] for continued migraine care.  HISTORY:  On 09/23/2022, she had an incident where she developed sudden onset trouble getting words out, ataxia, dizziness and right sided facial numbness and tingling in right foot.  No double vision, slurred speech, facial droop or unilateral numbness and weakness.  Denies prior history of similar symptoms.  Seen in the ED at [facility]  MRI of brain without contrast personally reviewed revealed minimal scattered nonspecific punctate T2/FLAIR foci in the bilateral cerebral white matter.  CTA head and neck personally reviewed revealed no LVO or hemodynamically significant stenosis.  EKG showed NSR but revealed evidence of septal infarct but troponin was negative.  She was discharged on ASA but she never started it because of her history of gastric bypass. Symptoms were severe for 20 minutes and then gradually resolved.  She has history of chronic refractory migraines.  No associated headache and this event was nothing like any prior migraine.  Since then, reports bilateral weakness and lethargy.  Reports elevated blood pressures since then.  Heart feels like it is sometimes racing.  She also has anxiety.   Patient has history of migraines Onset:  her 30s Location:  behind eyes, side  of head Quality:  sharp, pressure Intensity:  severe.   Aura:  absent Prodrome:  absent Associated symptoms:  Nausea, vomiting, photophobia, phonophobia, osmophobia, tinnitus, floaters  She denies associated unilateral numbness or weakness. Duration:  6-8 hours up to 2-3 days Frequency:  15 severe headache days, 8 moderate headache  days Frequency of abortive medication: more than 2 days a week Triggers:  change in barometric pressure, stress Relieving factors:  nothing Activity:  aggravates   Rescue protocol: first line Ubrelvy, second line eletriptan; Rarely takes Zomig NS as third line.  Hydroxyzine for severe cases.     Past NSAIDS/analgesics:  ibuprofen, tramadol Past abortive triptans:  rizatriptan, zolmitriptan tab/NS Past abortive ergotamine:  none Past muscle relaxants:  Flexeril Past anti-emetic:  none Past antihypertensive medications:  none Past antidepressant medications:  imipramine, fluoxetine, bupropion Past anticonvulsant medications:  topiramate Past anti-CGRP:  Aimovig (nausea) Past vitamins/Herbal/Supplements:  none Past antihistamines/decongestants:  none Other past therapies:  none    Family history of headache:  no.  Strokes and MIs run in family  PAST MEDICAL HISTORY: Past Medical History:  Diagnosis Date   Abnormal Pap smear    Anemia    Anxiety    Arthritis    neck   Broken toe 12-22-15   middle toe right foot   Cataract of both eyes    Depression    GERD (gastroesophageal reflux disease)    Headache(784.0)    Heart murmur    History of hiatal hernia 2021   small   History of kidney stones    Hypothyroidism    Intestinal malabsorption following gastrectomy 06/19/2015   Migraine    Perforated bowel (HCC)    Pernicious anemia 06/19/2015   PONV (postoperative nausea and vomiting)    Thyroid disease    hypothyrodism    MEDICATIONS: Current Outpatient Medications on File Prior to Visit  Medication Sig Dispense Refill   acetaminophen (TYLENOL) 325 MG tablet 1 tablet as needed     ALPRAZolam (XANAX) 1 MG tablet Take 1 mg by mouth 2 (two) times daily as needed. Anxiety     APLENZIN 348 MG TB24 Take 1 tablet by mouth every morning.     Armodafinil 250 MG tablet Take 250 mg by mouth every morning.  2   atorvastatin (LIPITOR) 80 MG tablet TAKE 1 TABLET(80 MG) BY MOUTH DAILY  60 tablet 1   Botulinum Toxin Type A (BOTOX) 200 units SOLR INJECT UP TO 200 UNITS  INTRAMUSCULARLY TO HEAD,  NECK, AND SHOULDERS EVERY  12 WEEKS (GIVEN AT MD  OFFICE, DISCARD UNUSED)     Calcium-Magnesium-Vitamin D 500-250-125 MG-MG-UNIT TABS 1 tablet with food Orally Three times a day     cyanocobalamin (,VITAMIN B-12,) 1000 MCG/ML injection Inject 1,000 mcg into the muscle every 21 ( twenty-one) days.      DEXILANT 60 MG capsule Take 60 mg by mouth daily.     diphenhydrAMINE (BENADRYL) 12.5 MG/5ML elixir Take 12.5 mg by mouth every 6 (six) hours as needed for allergies.      divalproex (DEPAKOTE) 500 MG DR tablet Take 1 tablet (500 mg total) by mouth 2 (two) times daily. 180 tablet 4   doxepin (SINEQUAN) 75 MG capsule Take 75 mg by mouth at bedtime.     eletriptan (RELPAX) 40 MG tablet Take 1 tablet (40 mg total) by mouth as needed for migraine or headache. May repeat in 2 hours if needed. Max 2 tabs per day or 8 tabs per month. 10 tablet  12   Estradiol (VAGIFEM) 10 MCG TABS vaginal tablet Place 1 tablet (10 mcg total) vaginally 2 (two) times a week. Use every night before bed for two weeks when you first begin this medicine, then after the first two weeks, begin using it twice a week. 34 tablet 3   estradiol (VIVELLE-DOT) 0.05 MG/24HR patch APPLY 1 PATCH(0.05 MG) TOPICALLY TO THE SKIN 2 TIMES A WEEK 24 patch 3   famotidine (PEPCID) 20 MG tablet one tab     fexofenadine (ALLEGRA) 180 MG tablet Take 180 mg by mouth daily as needed. allergies     fluticasone (FLONASE) 50 MCG/ACT nasal spray Place 1 spray into both nostrils as needed for allergies.   1   gabapentin (NEURONTIN) 300 MG capsule Take 1 capsule (300 mg total) by mouth 3 (three) times daily. 84 capsule 0   hydrOXYzine (ATARAX/VISTARIL) 25 MG tablet Take 25 mg by mouth every 8 (eight) hours as needed (headache).   2   lansoprazole (PREVACID) 15 MG capsule 1 capsule before a meal     Lasmiditan Succinate 100 MG TABS Take 100 mg by mouth as  needed (headache onset).      levothyroxine (SYNTHROID) 50 MCG tablet Take 1 tablet (50 mcg total) by mouth daily before breakfast. 30 tablet 3   LINZESS 145 MCG CAPS capsule Take 145 mcg by mouth daily.  3   losartan (COZAAR) 25 MG tablet Take 25 mg by mouth daily.     Multiple Vitamin (MULTIVITAMIN) capsule 1 tablet Orally once a day     Multiple Vitamins-Minerals (MULTIVITAMIN WITH MINERALS) tablet Take 1 tablet by mouth daily.     Naltrexone-buPROPion HCl ER (CONTRAVE) 8-90 MG TB12 Take by mouth.     nystatin (MYCOSTATIN/NYSTOP) powder Apply 1 application topically daily as needed (yeast infection). 30 g 2   nystatin-triamcinolone (MYCOLOG II) cream Apply 1 application topically 2 (two) times daily. Apply to affected area BID for up to 7 days. (Patient taking differently: Apply 1 application  topically 2 (two) times daily as needed (rash).) 60 g 0   ondansetron (ZOFRAN) 4 MG tablet Take 4 mg by mouth every 8 (eight) hours as needed for nausea or vomiting.      oxybutynin (DITROPAN XL) 10 MG 24 hr tablet Take 1 tablet (10 mg total) by mouth at bedtime. 90 tablet 3   promethazine (PHENERGAN) 25 MG suppository Place 25 mg rectally every 6 (six) hours as needed for nausea or vomiting.      ranitidine (ZANTAC) 150 MG capsule 1 capsule Orally Once a day before bedtime     temazepam (RESTORIL) 30 MG capsule Take by mouth.     thiamine (VITAMIN B-1) 50 MG tablet Take 50 mg by mouth daily.   0   tiZANidine (ZANAFLEX) 4 MG tablet Take 2-4 mg by mouth at bedtime.  0   TRINTELLIX 20 MG TABS tablet Take 20 mg by mouth daily.     Ubrogepant (UBRELVY) 100 MG TABS Take 1 tablet (100 mg total) by mouth as needed (take 1 tablet at onset of headache, can repeat in 2 hours if needed, max is 200 mg in 24 hours). 16 tablet 11   venlafaxine XR (EFFEXOR-XR) 150 MG 24 hr capsule Take 2 capsules by mouth every morning.     zolmitriptan (ZOMIG) 5 MG nasal solution Place into the nose.     zonisamide (ZONEGRAN) 100 MG  capsule TAKE 2 CAPSULES BY MOUTH EVERY NIGHT AT BEDTIME. Take along w/ one of  the 50mg  caps for total daily dose of 250mg      Current Facility-Administered Medications on File Prior to Visit  Medication Dose Route Frequency Provider Last Rate Last Admin   sodium chloride flush (NS) 0.9 % injection 10 mL  10 mL Intravenous PRN Everlena Cooper, Daymen Hassebrock R, DO   20 mL at 10/14/22 1335    ALLERGIES: Allergies  Allergen Reactions   Naproxen Anaphylaxis    Other reaction(s): Other Goes unconcious   Amoxicillin-Pot Clavulanate Other (See Comments) and Rash    Mouth sores Mouth sores Mouth sores Mouth sores Mouth sores Mouth sores Mouth sores    Monistat [Miconazole] Other (See Comments)    "burning"    Sulfa Antibiotics Hives   Tioconazole Itching    FAMILY HISTORY: Family History  Problem Relation Age of Onset   Diabetes Mother    Heart disease Father    Social phobia Father    Psychiatric Illness Father    Hypertension Father    Stroke Father    Dementia Father    Heart disease Other    Diabetes Other    Stroke Other    Hypertension Maternal Grandmother    Thyroid disease Maternal Grandmother    Hypertension Paternal Grandmother    Stroke Paternal Grandfather    Breast cancer Neg Hx       Objective:  Blood pressure (!) 174/95, pulse (!) 126, height 5\' 3"  (1.6 m), weight 189 lb (85.7 kg), last menstrual period 03/08/2019, SpO2 98 %. General: Anxious.  Patient appears well-groomed.   Head:  Normocephalic/atraumatic Eyes:  Fundi examined but not visualized Neck: supple, no paraspinal tenderness, full range of motion Heart:  Regular rate and rhythm Lungs:  Clear to auscultation bilaterally Back: No paraspinal tenderness Neurological Exam: alert and oriented to person, place, and time.  Speech fluent and not dysarthric, language intact.  CN II-XII intact. Bulk and tone normal, muscle strength 5/5 throughout.  Sensation to light touch intact.  Deep tendon reflexes 2+ throughout,  toes downgoing.  Finger to nose testing intact.  Gait normal, Romberg negative.   Shon Millet, DO  CC: Merri Brunette, MD

## 2023-04-06 ENCOUNTER — Encounter: Payer: Self-pay | Admitting: Neurology

## 2023-04-06 ENCOUNTER — Ambulatory Visit: Payer: 59 | Admitting: Neurology

## 2023-04-06 VITALS — BP 174/95 | HR 126 | Ht 63.0 in | Wt 189.0 lb

## 2023-04-06 DIAGNOSIS — I10 Essential (primary) hypertension: Secondary | ICD-10-CM

## 2023-04-06 DIAGNOSIS — F419 Anxiety disorder, unspecified: Secondary | ICD-10-CM

## 2023-04-06 DIAGNOSIS — G459 Transient cerebral ischemic attack, unspecified: Secondary | ICD-10-CM | POA: Diagnosis not present

## 2023-04-06 DIAGNOSIS — R4189 Other symptoms and signs involving cognitive functions and awareness: Secondary | ICD-10-CM | POA: Diagnosis not present

## 2023-04-06 NOTE — Patient Instructions (Signed)
Continue aspirin 81mg  daily Continue atorvastatin 80mg  daily.  I want to check a fasting lipid panel and LFTs.  Going forward, may be prescribed by your PCP Follow up with psychiatry regarding anxiety Follow up with your headache specialist Follow up with me as needed

## 2023-04-10 ENCOUNTER — Telehealth: Payer: Self-pay | Admitting: Neurology

## 2023-04-10 ENCOUNTER — Ambulatory Visit: Payer: 59 | Admitting: Neurology

## 2023-04-10 NOTE — Telephone Encounter (Signed)
Patient saw Dr. Adriana Mccallum 5/2 for a new patient appointment and had a new appointment. He already evaluated her for TIA or confusion for memory loss I don;t think its appropriate to hve a whole other 1 hour appointment with me. In fact she also saw   She also saw Dr. Terrial Rhodes 5/1 for an entire appointment for her TIA memory loss similat   Patient now has an hour appointment with me today which isn't appropriate since she has seen 2 other neurologists just this week for entire new patient appointments.    At this point we inject her botox only. I'm not sure what to do with this patient with 3 neurologists I think one consieration is that she can be transferred to Dr Everlena Cooper to also inject her botox since she has started seeing him. Please advise.

## 2023-05-03 ENCOUNTER — Other Ambulatory Visit: Payer: Self-pay | Admitting: Neurology

## 2023-05-13 ENCOUNTER — Emergency Department (HOSPITAL_COMMUNITY): Payer: 59

## 2023-05-13 ENCOUNTER — Emergency Department (HOSPITAL_COMMUNITY)
Admission: EM | Admit: 2023-05-13 | Discharge: 2023-05-13 | Disposition: A | Discharge status: Home / Self Care | Payer: 59 | Attending: Emergency Medicine | Admitting: Emergency Medicine

## 2023-05-13 ENCOUNTER — Other Ambulatory Visit: Payer: Self-pay

## 2023-05-13 DIAGNOSIS — R4020 Unspecified coma: Secondary | ICD-10-CM

## 2023-05-13 DIAGNOSIS — R55 Syncope and collapse: Secondary | ICD-10-CM | POA: Diagnosis present

## 2023-05-13 LAB — CBC WITH DIFFERENTIAL/PLATELET
Abs Immature Granulocytes: 0.03 10*3/uL (ref 0.00–0.07)
Basophils Absolute: 0 10*3/uL (ref 0.0–0.1)
Basophils Relative: 0 %
Eosinophils Absolute: 0.1 10*3/uL (ref 0.0–0.5)
Eosinophils Relative: 1 %
HCT: 44.2 % (ref 36.0–46.0)
Hemoglobin: 14 g/dL (ref 12.0–15.0)
Immature Granulocytes: 0 %
Lymphocytes Relative: 27 %
Lymphs Abs: 2.8 10*3/uL (ref 0.7–4.0)
MCH: 30.6 pg (ref 26.0–34.0)
MCHC: 31.7 g/dL (ref 30.0–36.0)
MCV: 96.5 fL (ref 80.0–100.0)
Monocytes Absolute: 0.5 10*3/uL (ref 0.1–1.0)
Monocytes Relative: 5 %
Neutro Abs: 6.9 10*3/uL (ref 1.7–7.7)
Neutrophils Relative %: 67 %
Platelets: 321 10*3/uL (ref 150–400)
RBC: 4.58 MIL/uL (ref 3.87–5.11)
RDW: 12.3 % (ref 11.5–15.5)
WBC: 10.3 10*3/uL (ref 4.0–10.5)
nRBC: 0 % (ref 0.0–0.2)

## 2023-05-13 LAB — COMPREHENSIVE METABOLIC PANEL
ALT: 22 U/L (ref 0–44)
AST: 24 U/L (ref 15–41)
Albumin: 3.8 g/dL (ref 3.5–5.0)
Alkaline Phosphatase: 79 U/L (ref 38–126)
Anion gap: 12 (ref 5–15)
BUN: 8 mg/dL (ref 6–20)
CO2: 25 mmol/L (ref 22–32)
Calcium: 9.2 mg/dL (ref 8.9–10.3)
Chloride: 104 mmol/L (ref 98–111)
Creatinine, Ser: 0.95 mg/dL (ref 0.44–1.00)
GFR, Estimated: >60 mL/min (ref >60)
Glucose, Bld: 140 mg/dL — ABNORMAL HIGH (ref 70–99)
Potassium: 3.8 mmol/L (ref 3.5–5.1)
Sodium: 141 mmol/L (ref 135–145)
Total Bilirubin: 0.6 mg/dL (ref 0.3–1.2)
Total Protein: 6.9 g/dL (ref 6.5–8.1)

## 2023-05-13 LAB — TROPONIN I (HIGH SENSITIVITY)
Troponin I (High Sensitivity): 3 ng/L (ref <18)
Troponin I (High Sensitivity): 3 ng/L (ref <18)

## 2023-05-13 LAB — CBG MONITORING, ED: Glucose-Capillary: 81 mg/dL (ref 70–99)

## 2023-05-13 MED ORDER — ACETAMINOPHEN 325 MG PO TABS
650.0000 mg | ORAL_TABLET | Freq: Once | ORAL | Status: AC
  Administered 2023-05-13: 650 mg via ORAL
  Filled 2023-05-13: qty 2

## 2023-05-13 MED ORDER — ONDANSETRON HCL 4 MG/2ML IJ SOLN
4.0000 mg | Freq: Once | INTRAMUSCULAR | Status: AC
  Administered 2023-05-13: 4 mg via INTRAVENOUS
  Filled 2023-05-13: qty 2

## 2023-05-13 MED ORDER — SODIUM CHLORIDE 0.9 % IV SOLN: INTRAVENOUS | Status: DC

## 2023-05-13 MED ORDER — SODIUM CHLORIDE 0.9 % IV BOLUS
1000.0000 mL | Freq: Once | INTRAVENOUS | Status: AC
  Administered 2023-05-13: 1000 mL via INTRAVENOUS

## 2023-05-13 MED ORDER — DIAZEPAM 2 MG PO TABS
2.0000 mg | ORAL_TABLET | Freq: Once | ORAL | Status: AC
  Administered 2023-05-13: 2 mg via ORAL
  Filled 2023-05-13: qty 1

## 2023-05-13 NOTE — ED Notes (Signed)
Pt ambulatory to bathroom, pt states that she doesn't want stand by, respected pt's privacy.

## 2023-05-13 NOTE — ED Triage Notes (Signed)
Patient bib [facility] from a birthday party after a syncopal episode. EMS reports patient got dizzy and was trying to stand up to go inside and lost consciousness and reported she hit the right side of her head on the side of a cooler. Patient reports she had been outside for about 3 hours and has not had a lot of water today. She also reports that the right side of the head hurts.  EMS reports VSS  CBG 144

## 2023-05-13 NOTE — ED Notes (Signed)
Pt in bed, pt reports some pain at iv site, no swelling noted, flush without swelling, positive blood return.

## 2023-05-13 NOTE — ED Provider Notes (Signed)
[place] EMERGENCY DEPARTMENT AT [facility] Provider Note   CSN: [id number] Arrival date & time: 05/13/23  1501     History  Chief Complaint  Patient presents with   Loss of Consciousness    Megan Davila is a 57 y.o. female who presents emergency department with chief complaint loss of consciousness.  She has a past medical history of migraine headache she is status post  gastric bypas surgery. Patient reports being at her great niece's bday party outside. She began feeling sick and light headed after accidentally consuming an orange soda that had etoh and a piece of cake. She began feeling extremely light headed and weak and had associated nausea and felt the need to defecate. She sat with her head in her hands on a cooler and family came to assess her. They tried to help her stand and se promptly lost consciousness. She fell to the gound and hit her head. She denies head pain. She continues to feel mildly nauseasoues. She denis palpitaitons, racing heart of chestpain she has a hx of loc. She has never had dumping syndrome.  She is not onblood thinnners and denies any melena.  Loss of Consciousness      Home Medications Prior to Admission medications   Medication Sig Start Date End Date Taking? Authorizing Provider  acetaminophen (TYLENOL) 325 MG tablet 1 tablet as needed    [provider]  ALPRAZolam (XANAX) 1 MG tablet Take 1 mg by mouth 2 (two) times daily as needed. Anxiety    [provider]  APLENZIN 348 MG TB24 Take 1 tablet by mouth every morning. 06/30/22   [provider]  Armodafinil 250 MG tablet Take 250 mg by mouth every morning. 05/24/18   [provider]  atorvastatin (LIPITOR) 80 MG tablet TAKE 1 TABLET(80 MG) BY MOUTH DAILY 05/04/23   Everlena Cooper, Adam R, DO  Botulinum Toxin Type A (BOTOX) 200 units SOLR INJECT UP TO 200 UNITS  INTRAMUSCULARLY TO HEAD,  NECK, AND SHOULDERS EVERY  12 WEEKS (GIVEN AT MD  OFFICE, DISCARD UNUSED)  01/01/21   [provider]  Calcium-Magnesium-Vitamin D [phone] MG-MG-UNIT TABS 1 tablet with food Orally Three times a day 08/06/15   [provider]  cyanocobalamin (,VITAMIN B-12,) 1000 MCG/ML injection Inject 1,000 mcg into the muscle every 21 ( twenty-one) days.  06/03/15   [provider]  diphenhydrAMINE (BENADRYL) 12.5 MG/5ML elixir Take 12.5 mg by mouth every 6 (six) hours as needed for allergies.     [provider]  eletriptan (RELPAX) 40 MG tablet Take 1 tablet (40 mg total) by mouth as needed for migraine or headache. May repeat in 2 hours if needed. Max 2 tabs per day or 8 tabs per month. 04/23/18   Penumalli, Glenford Bayley, MD  Estradiol (VAGIFEM) 10 MCG TABS vaginal tablet Place 1 tablet (10 mcg total) vaginally 2 (two) times a week. Use every night before bed for two weeks when you first begin this medicine, then after the first two weeks, begin using it twice a week. 08/01/22   Patton Salles, MD  famotidine (PEPCID) 20 MG tablet one tab 06/09/20   [provider]  fexofenadine (ALLEGRA) 180 MG tablet Take 180 mg by mouth daily as needed. allergies    [provider]  fluticasone (FLONASE) 50 MCG/ACT nasal spray Place 1 spray into both nostrils as needed for allergies.  01/17/17   [provider]  gabapentin (NEURONTIN) 300 MG capsule  Take 1 capsule (300 mg total) by mouth 3 (three) times daily. 09/20/22   London Sheer, MD  hydrOXYzine (ATARAX/VISTARIL) 25 MG tablet Take 25 mg by mouth every 8 (eight) hours as needed (headache).  10/04/18   [provider]  lansoprazole (PREVACID) 15 MG capsule 1 capsule before a meal    [provider]  Lasmiditan Succinate 100 MG TABS Take 100 mg by mouth as needed (headache onset).  11/26/19   [provider]  levothyroxine (SYNTHROID) 50 MCG tablet Take 1 tablet (50 mcg total) by mouth daily before breakfast. 10/04/22   Nahser, Deloris Ping, MD  LINZESS 145  MCG CAPS capsule Take 145 mcg by mouth daily. 09/03/18   [provider]  losartan (COZAAR) 25 MG tablet Take 25 mg by mouth daily. 11/01/22   [provider]  Multiple Vitamin (MULTIVITAMIN) capsule 1 tablet Orally once a day    [provider]  Multiple Vitamins-Minerals (MULTIVITAMIN WITH MINERALS) tablet Take 1 tablet by mouth daily.    [provider]  nystatin (MYCOSTATIN/NYSTOP) powder Apply 1 application topically daily as needed (yeast infection). 07/28/21   Patton Salles, MD  nystatin-triamcinolone (MYCOLOG II) cream Apply 1 application topically 2 (two) times daily. Apply to affected area BID for up to 7 days. Patient taking differently: Apply 1 application  topically 2 (two) times daily as needed (rash). 04/22/20   Patton Salles, MD  ondansetron (ZOFRAN) 4 MG tablet Take 4 mg by mouth every 8 (eight) hours as needed for nausea or vomiting.  10/22/19   [provider]  promethazine (PHENERGAN) 25 MG suppository Place 25 mg rectally every 6 (six) hours as needed for nausea or vomiting.  01/10/20   [provider]  ranitidine (ZANTAC) 150 MG capsule 1 capsule Orally Once a day before bedtime 02/25/15   [provider]  thiamine (VITAMIN B-1) 50 MG tablet Take 50 mg by mouth daily.  08/07/15   [provider]  tiZANidine (ZANAFLEX) 4 MG tablet Take 2-4 mg by mouth at bedtime. 05/22/18   [provider]  TRINTELLIX 20 MG TABS tablet Take 20 mg by mouth daily. 06/30/22   [provider]  Ubrogepant (UBRELVY) 100 MG TABS Take 1 tablet (100 mg total) by mouth as needed (take 1 tablet at onset of headache, can repeat in 2 hours if needed, max is 200 mg in 24 hours). 12/29/22   Glean Salvo, NP  zonisamide (ZONEGRAN) 100 MG capsule TAKE 2 CAPSULES BY MOUTH EVERY NIGHT AT BEDTIME. Take along w/ one of the 50mg  caps for total daily dose of 250mg  11/25/20   [provider]      Allergies     Naproxen, Amoxicillin-pot clavulanate, Monistat [miconazole], Sulfa antibiotics, and Tioconazole    Review of Systems   Review of Systems  Cardiovascular:  Positive for syncope.    Physical Exam Updated Vital Signs BP (!) 146/89   Pulse 75   Temp 98.8 F (37.1 C)   Resp 17   Ht 5\' 3"  (1.6 m)   Wt 84.4 kg   LMP 03/08/2019 (Exact Date)   SpO2 99%   BMI 32.95 kg/m  Physical Exam Vitals and nursing note reviewed.  Constitutional:      General: She is not in acute distress.    Appearance: She is well-developed. She is not diaphoretic.  HENT:     Head: Normocephalic and atraumatic.     Right Ear: External ear  normal.     Left Ear: External ear normal.     Nose: Nose normal.     Mouth/Throat:     Mouth: Mucous membranes are moist.  Eyes:     General: No scleral icterus.    Conjunctiva/sclera: Conjunctivae normal.  Cardiovascular:     Rate and Rhythm: Normal rate and regular rhythm.     Heart sounds: Normal heart sounds. No murmur heard.    No friction rub. No gallop.  Pulmonary:     Effort: Pulmonary effort is normal. No respiratory distress.     Breath sounds: Normal breath sounds.  Abdominal:     General: Bowel sounds are normal. There is no distension.     Palpations: Abdomen is soft. There is no mass.     Tenderness: There is no abdominal tenderness. There is no guarding.  Musculoskeletal:     Cervical back: Normal range of motion.  Skin:    General: Skin is warm and dry.  Neurological:     General: No focal deficit present.     Mental Status: She is alert and oriented to person, place, and time. Mental status is at baseline.     Cranial Nerves: No cranial nerve deficit.     Sensory: No sensory deficit.     Motor: No weakness.     Coordination: Coordination normal.     Gait: Gait normal.     Deep Tendon Reflexes: Reflexes normal.  Psychiatric:        Mood and Affect: Mood is anxious.        Speech: Speech is tangential.        Behavior: Behavior normal.      ED Results / Procedures / Treatments   Labs (all labs ordered are listed, but only abnormal results are displayed) Labs Reviewed  CBC WITH DIFFERENTIAL/PLATELET  COMPREHENSIVE METABOLIC PANEL  CBG MONITORING, ED  TROPONIN I (HIGH SENSITIVITY)    EKG None  Radiology No results found.  Procedures Procedures    Medications Ordered in ED Medications  sodium chloride 0.9 % bolus 1,000 mL (has no administration in time range)    And  0.9 %  sodium chloride infusion (has no administration in time range)  ondansetron (ZOFRAN) injection 4 mg (has no administration in time range)    ED Course/ Medical Decision Making/ A&P                             Medical Decision Making Amount and/or Complexity of Data Reviewed Labs: ordered. Radiology: ordered.  Risk OTC drugs. Prescription drug management.   This patient presents to the ED with chief complaint(s) of loc, brief with pertinent past medical history of gastric bypass which further complicates the presenting complaint. The complaint involves an extensive differential diagnosis and treatment options and also carries with it a high risk of complications and morbidity.    The differential diagnosis includes The differential for syncope is extensive and includes, but is not limited to: arrythmia (Vtach, SVT, SSS, sinus arrest, AV block, bradycardia) aortic stenosis, AMI, HOCM, PE, atrial myxoma, pulmonary hypertension, orthostatic hypotension, (hypovolemia, drug effect, GB syndrome, micturition, cough, swall) carotid sinus sensitivity, Seizure, TIA/CVA, hypoglycemia, dumping syndrome Vertigo.    The initial plan is to order labs, EKG, imaging and to treat patient with fluids and zofran .  Additional Tests and treatment considered: Patient is low risk / negative by SFSS, t \\Additional  history obtained: Additional history obtained from family  Records reviewed previous admission documents  Reassessment and review (also  see workup area): Lab Tests: I Ordered, and personally interpreted labs.  The pertinent results include:    Imaging Studies: I ordered and independently visualized and interpreted the following imaging CT scan head and C-spine   which showed no acute findings The interpretation of the imaging was limited to assessing for emergent pathology, for which purpose it was ordered.   Medicines ordered and prescription drug management: I ordered the following medications Zofran, diazepam for nausea, anxiety    Reevaluation of the patient after these medicines showed that the patient    improved  Social Determinants of Health: SDOH Screenings   Tobacco Use: Low Risk  (04/06/2023)     Cardiac Monitoring: The patient was maintained on a cardiac monitor.  I personally viewed and interpreted the cardiac monitor which showed an underlying rhythm of:  sinus rhythm  Complexity of problems addressed: Patient's presentation is most consistent with  acute complicated illness/injury requiring diagnostic workup During patient's assessment  Disposition: After consideration of the diagnostic results and the patient's response to treatment,  I feel that the patent would benefit from discharge with PCP follow-up.  After review of all data points patient does not appear to have an emergent cause of her LOC suspected that it was a combination of being outside in the heat with a high heat index and a small amount of alcohol.  Doubt dumping syndrome.  Cardiac workup is benign.  Appears appropriate for discharge at this time .          Final Clinical Impression(s) / ED Diagnoses Final diagnoses:  LOC (loss of consciousness) [facility])    Rx / DC Orders ED Discharge Orders     None         Arthor Captain, PA-C 05/13/23 2349    Loetta Rough, MD 05/14/23 2358

## 2023-05-13 NOTE — Discharge Instructions (Signed)
Get help right away if: You faint. You hit your head or are injured after fainting. You have any of these symptoms that may indicate trouble with your heart: Fast or irregular heartbeats (palpitations). Unusual pain in your chest, abdomen, or back. Shortness of breath. You have a seizure. You have a severe headache. You are confused. You have vision problems. You have severe weakness or trouble walking. You are bleeding from your mouth or rectum, or you have black or tarry stool. These symptoms may represent a serious problem that is an emergency. Do not wait to see if your symptoms will go away. Get medical help right away. Call your local emergency services (911 in the [place] Do not drive yourself to the hospital. 

## 2023-05-17 ENCOUNTER — Ambulatory Visit: Payer: 59 | Admitting: Cardiovascular Disease

## 2023-05-23 ENCOUNTER — Ambulatory Visit: Payer: 59 | Admitting: Neurology

## 2023-05-23 ENCOUNTER — Encounter: Payer: Self-pay | Admitting: Neurology

## 2023-05-23 VITALS — BP 131/91 | HR 93 | Ht 63.0 in | Wt 190.0 lb

## 2023-05-23 DIAGNOSIS — G43709 Chronic migraine without aura, not intractable, without status migrainosus: Secondary | ICD-10-CM | POA: Diagnosis not present

## 2023-05-23 MED ORDER — ELETRIPTAN HYDROBROMIDE 40 MG PO TABS: 40.0000 mg | ORAL_TABLET | ORAL | 12 refills | Status: DC | PRN

## 2023-05-23 MED ORDER — TIZANIDINE HCL 2 MG PO TABS: 2.0000 mg | ORAL_TABLET | Freq: Two times a day (BID) | ORAL | 6 refills | Status: DC | PRN

## 2023-05-23 MED ORDER — ONDANSETRON HCL 4 MG PO TABS: 4.0000 mg | ORAL_TABLET | Freq: Three times a day (TID) | ORAL | 11 refills | Status: DC | PRN

## 2023-05-23 MED ORDER — ONABOTULINUMTOXINA 100 UNITS IJ SOLR
155.0000 [IU] | Freq: Once | INTRAMUSCULAR | Status: AC
  Administered 2023-05-23: 155 [IU] via INTRAMUSCULAR

## 2023-05-23 MED ORDER — GABAPENTIN 300 MG PO CAPS: 300.0000 mg | ORAL_CAPSULE | Freq: Three times a day (TID) | ORAL | 3 refills | Status: DC

## 2023-05-23 NOTE — Progress Notes (Unsigned)
BOTOX PROCEDURE NOTE FOR MIGRAINE HEADACHE  05/23/2023: 2nd botox injection but long time inbetween so like starting over again; 3 months sarah, we can alternate I can do the next one. [facility]. Patient would like trigger point injections and we do not provide thoe on a regular basis.***start vyepti  Orders Placed This Encounter  Procedures   Ambulatory referral to Neurology   Meds ordered this encounter  Medications   botulinum toxin Type A (BOTOX) injection 155 Units    Botox 200 units x 1 vial  LOT #: [id number] EXP: 2026/08 NDC: 4540-9811-91  BB   eletriptan (RELPAX) 40 MG tablet    Sig: Take 1 tablet (40 mg total) by mouth as needed for migraine or headache. May repeat in 2 hours if needed. Max 2 tabs per day or 8 tabs per month.    Dispense:  10 tablet    Refill:  12   gabapentin (NEURONTIN) 300 MG capsule    Sig: Take 1 capsule (300 mg total) by mouth 3 (three) times daily.    Dispense:  90 capsule    Refill:  3   ondansetron (ZOFRAN) 4 MG tablet    Sig: Take 1 tablet (4 mg total) by mouth every 8 (eight) hours as needed for nausea or vomiting.    Dispense:  20 tablet    Refill:  11   tiZANidine (ZANAFLEX) 2 MG tablet    Sig: Take 1 tablet (2 mg total) by mouth 3 times/day as needed-between meals & bedtime for muscle spasms.    Dispense:  20 tablet    Refill:  6    HISTORY 12/29/2022: Here today for Botox injection, her headaches had been under good control. She is past-due, last was around October. Was going to the [facility], it closed. 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 She takes Xanax as needed, she took 1 before coming here today. Has daily migraine headaches right now. Even with Botox, gets 15 migraines a month.   She brought a grocery bag full of medications, for migraines: gabapentin 900 mg at bedtime, Effexor XR 150 mg 2 in the AM, Zonegran 250 mg at bedtime, Relpax as needed.   Reports in  between the Botox she got trigger point injections.   Description of procedure:  The patient was placed in a sitting position. The standard protocol was used for Botox as follows, with 5 units of Botox injected at each site:  -Procerus muscle, midline injection  -Corrugator muscle, bilateral injection  -Frontalis muscle, bilateral injection, with 2 sites each side, medial injection was performed in the upper one third of the frontalis muscle, in the region vertical from the medial inferior edge of the superior orbital rim. The lateral injection was again in the upper one third of the forehead vertically above the lateral limbus of the cornea, 1.5 cm lateral to the medial injection site.  -Temporalis muscle injection, 4 sites, bilaterally. The first injection was 3 cm above the tragus of the ear, second injection site was 1.5 cm to 3 cm up from the first injection site in line with the tragus of the ear. The third injection site was 1.5-3 cm forward between the first 2 injection sites. The fourth injection site was 1.5 cm posterior to the second injection site.  -Occipitalis muscle injection, 3 sites, bilaterally. The first injection was done one half way between the occipital protuberance and the tip of the mastoid process behind the  ear. The second injection site was done lateral and superior to the first, 1 fingerbreadth from the first injection. The third injection site was 1 fingerbreadth superiorly and medially from the first injection site.  -Cervical paraspinal muscle injection, 2 sites, bilateral, the first injection site was 1 cm from the midline of the cervical spine, 3 cm inferior to the lower border of the occipital protuberance. The second injection site was 1.5 cm superiorly and laterally to the first injection site.  -Trapezius muscle injection was performed at 3 sites, bilaterally. The first injection site was in the upper trapezius muscle halfway between the inflection point of the  neck, and the acromion. The second injection site was one half way between the acromion and the first injection site. The third injection was done between the first injection site and the inflection point of the neck.  A 200 unit bottle of Botox was used, 155 units were injected, the rest of the Botox was wasted. The patient tolerated the procedure well, there were no complications of the above procedure.  Botox NDC 4098-1191-47 Lot number [id number] Expiration date 05/2025 SP  I reviewed her medications for migraines. I refilled her Bernita Raisin but I increased it to 100 mg. She has appointment 01/23/23 with Dr. Lucia Gaskins. She is more clear headed today than it sounds like she was last week, but she did report she got lost on the way here. She took a Xanax before coming today. 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

## 2023-05-23 NOTE — Progress Notes (Unsigned)
Botox 200 units x 1 vial  LOT #: [id number] EXP: 2026/08 NDC: 6295-2841-32  BB  Witnessed by Randa Evens.

## 2023-05-24 ENCOUNTER — Telehealth: Payer: Self-pay | Admitting: Neurology

## 2023-05-24 NOTE — Telephone Encounter (Signed)
The only thing we can do is  1. Transfer her care to [facility] and see if they will do the botox and trigger points (and now we are trying to get her vyepti) 2. Dry needling and physical therapy on the neck. PT does dry needling which is similar to the trigger points. We could refer her there. Please discuss

## 2023-05-24 NOTE — Telephone Encounter (Signed)
Received call from [place] at [facility]. Stated patients insurance, [employer] unfortunately does not cover trigger point injections. She would be paying for these self pay which is about $500 per treatment. I let her know we'd give them a call back to see if referral is still needed.

## 2023-05-24 NOTE — Telephone Encounter (Signed)
Referral faxed to [facility]: Phone [phone]  Fax: [phone]

## 2023-05-24 NOTE — Telephone Encounter (Signed)
Pt called wanting to know when her eletriptan (RELPAX) 40 MG tablet will be called in to the Pharmacy for her and she also called to find out where she was referred to for further treatment for her Migraines. Please advise.

## 2023-05-25 ENCOUNTER — Telehealth: Payer: Self-pay | Admitting: *Deleted

## 2023-05-25 NOTE — Telephone Encounter (Signed)
-----   Message from Anson Fret, MD sent at 05/24/2023  7:21 PM EDT ----- Regarding: Clint Guy start vyepti approval see recent botox note for details olease start vyepti approval see recent botox note for details

## 2023-05-25 NOTE — Telephone Encounter (Signed)
I called pt.  She said [facility] does not take insurance.  She cannot afford trigger point injections.  She does not want to do PT/Dry Needling.  She mentions infusion (vyepti) to be given to [facility] to see if approved.  To be signed and then given to them.  She will call pharmacy and make sure eletriptan is available.  I did read about [facility] other option but will hold off for now. ?

## 2023-05-25 NOTE — Telephone Encounter (Signed)
I called and was on hold , will have to call back.

## 2023-05-26 NOTE — Telephone Encounter (Signed)
I did place vyepti form for [facility] 05-25-2023 to start process.  Prescription faxed to her pharmacy for relpax [phone].

## 2023-06-20 ENCOUNTER — Ambulatory Visit: Payer: 59 | Admitting: Neurology

## 2023-06-25 ENCOUNTER — Encounter: Payer: Self-pay | Admitting: Cardiovascular Disease

## 2023-06-25 NOTE — Progress Notes (Unsigned)
Cardiology Office Note:    Date:  06/27/2023   ID:  Megan Davila, DOB Sep 26, 1966, MRN [id number]  PCP:  Merri Brunette, MD   [facility] Providers Cardiologist:  Ulmer Degen     Referring MD: Merri Brunette, MD   Chief Complaint  Patient presents with   Transient Ischemic Attack   Hyperlipidemia    History of Present Illness:    Megan Davila is a 57 y.o. female with a hx of TIA . , hypothyroidism,   obesity, s/p gastric bypass surgery  Down 90 lbs from her largest  weight  ( Got as low as 100 lbs )   Several weeks ago.  Was on her way to a doctors appt Put the dog in the kennel Couldn't talk,  was not able to walk in a straight line  Speech was altered   Went to the ER , CT scan of brain was normal  MRI of the brain looks normal    Went back to there ER this past Friday ( 4 days ago) with "feeling bad"   - shortness of breath, HTN (  160 / 112) Troponin levels were negative After waiting 9 hours in the ER,  she was having lots of back pain and she left   Hx of hypothyroidism - is not on her levothyroxine   Hx of heart murmur   Occasional palpitations  Exercised some,  walks the dog  Still having some speech abn.  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   + family hx of strokes   Dec. 5, 2023  Megan Davila is seen today for follow up visit of her TIA  Echo reveals EF 60-65% Grade I DD Normal pulm pressures  Event monitor is in progress,  has been sent back   Feels the same .  Still forgetting things ,    May need some PT   June 27, 2023 Megan Davila is seen for follow up for her TIA, hx of HLD  Event monitor showed no atrial fib 1 episode of SVT lasting 6 beats , rare PACs, PVCs Wt is 185 lbs  She had a syncopal episode in March  Was very lightheaded before hand Had eaten lunch , plenty to drink   Was taken to the hospital  Head CT was unremarkable   BP is variable  BP goes up significantly when she is stress  Echocardiogram from November,  2023 reveals normal left ventricular systolic function with EF of 60 to 65%.  She has grade 1 diastolic dysfunction.  Right ventricle is normal.  She has mild mitral regurgitation.  Is not getting much regular exercise  Gets lightheaded with exercise           Past Medical History:  Diagnosis Date   Abnormal Pap smear    Anemia    Anxiety    Arthritis    neck   Broken toe 12-22-15   middle toe right foot   Cataract of both eyes    Depression    GERD (gastroesophageal reflux disease)    Headache(784.0)    Heart murmur    History of hiatal hernia 2021   small   History of kidney stones    Hypothyroidism    Intestinal malabsorption following gastrectomy 06/19/2015   Migraine    Perforated bowel (HCC)    Pernicious anemia 06/19/2015   PONV (postoperative nausea and vomiting)    Thyroid disease    hypothyrodism    Past Surgical History:  Procedure  Laterality Date   CATARACT EXTRACTION, BILATERAL Bilateral 02/2016   CERVICAL BIOPSY  W/ LOOP ELECTRODE EXCISION  11/2013, 05/2018, 11/2019   LGSIL, negative for dysplasia, LGSIL and positive ECC with potential LGSIL  - respectively   CERVIX LESION DESTRUCTION  1993   CIN III, [facility], recurrence--YAG laser   CESAREAN SECTION  2008   CHOLECYSTECTOMY     COLONOSCOPY  10/2020   CYSTOSCOPY N/A 10/26/2020   Procedure: CYSTOSCOPY;  Surgeon: Patton Salles, MD;  Location: [facility];  Service: Gynecology;  Laterality: N/A;   GASTRIC BYPASS  2002   HYSTEROSCOPY WITH D & C  10/14/2011   Procedure: DILATATION AND CURETTAGE (D&C) /HYSTEROSCOPY;  Surgeon: Melony Overly;  Location: [facility];  Service: Gynecology;  Laterality: Bilateral;   LASIK     NOSE SURGERY     STOMACH SURGERY  04/20/2015   dilation of "connection between stomach and intestine", [facility]   TONSILLECTOMY     TONSILLECTOMY     TOTAL LAPAROSCOPIC HYSTERECTOMY WITH BILATERAL SALPINGO OOPHORECTOMY N/A 10/26/2020   Procedure: TOTAL LAPAROSCOPIC  HYSTERECTOMY WITH BILATERAL SALPINGECTOMY;  Surgeon: Patton Salles, MD;  Location: [facility];  Service: Gynecology;  Laterality: N/A;   TUBAL LIGATION  2008    Current Medications: Current Meds  Medication Sig   acetaminophen (TYLENOL) 325 MG tablet 1 tablet as needed   ALPRAZolam (XANAX) 1 MG tablet Take 1 mg by mouth 2 (two) times daily as needed. Anxiety   APLENZIN 348 MG TB24 Take 1 tablet by mouth every morning.   Armodafinil 250 MG tablet Take 250 mg by mouth every morning.   aspirin EC 81 MG tablet Take 81 mg by mouth daily. Swallow whole.   Botulinum Toxin Type A (BOTOX) 200 units SOLR INJECT UP TO 200 UNITS  INTRAMUSCULARLY TO HEAD,  NECK, AND SHOULDERS EVERY  12 WEEKS (GIVEN AT MD  OFFICE, DISCARD UNUSED)   Calcium-Magnesium-Vitamin D 500-250-125 MG-MG-UNIT TABS 1 tablet with food Orally Three times a day   diphenhydrAMINE (BENADRYL) 12.5 MG/5ML elixir Take 12.5 mg by mouth every 6 (six) hours as needed for allergies.    eletriptan (RELPAX) 40 MG tablet Take 1 tablet (40 mg total) by mouth as needed for migraine or headache. May repeat in 2 hours if needed. Max 2 tabs per day or 8 tabs per month.   famotidine (PEPCID) 20 MG tablet as needed.   fexofenadine (ALLEGRA) 180 MG tablet Take 180 mg by mouth daily as needed. allergies   fluticasone (FLONASE) 50 MCG/ACT nasal spray Place 1 spray into both nostrils as needed for allergies.    gabapentin (NEURONTIN) 300 MG capsule Take 1 capsule (300 mg total) by mouth 3 (three) times daily.   lansoprazole (PREVACID) 15 MG capsule as needed.   losartan (COZAAR) 25 MG tablet Take 25 mg by mouth daily.   Multiple Vitamin (MULTIVITAMIN) capsule 1 tablet Orally once a day   Multiple Vitamins-Minerals (MULTIVITAMIN WITH MINERALS) tablet Take 1 tablet by mouth daily.   nystatin (MYCOSTATIN/NYSTOP) powder Apply 1 application topically daily as needed (yeast infection).   nystatin-triamcinolone (MYCOLOG II) cream Apply 1  application topically 2 (two) times daily. Apply to affected area BID for up to 7 days. (Patient taking differently: Apply 1 application  topically 2 (two) times daily as needed (rash).)   ondansetron (ZOFRAN) 4 MG tablet Take 1 tablet (4 mg total) by mouth every 8 (eight) hours as needed for nausea or vomiting.  promethazine (PHENERGAN) 25 MG suppository Place 25 mg rectally every 6 (six) hours as needed for nausea or vomiting.    ranitidine (ZANTAC) 150 MG capsule 1 capsule Orally Once a day before bedtime   thiamine (VITAMIN B-1) 50 MG tablet Take 50 mg by mouth daily.    tiZANidine (ZANAFLEX) 2 MG tablet Take 1 tablet (2 mg total) by mouth 3 times/day as needed-between meals & bedtime for muscle spasms.   TRINTELLIX 20 MG TABS tablet Take 20 mg by mouth daily.   Ubrogepant (UBRELVY) 100 MG TABS Take 1 tablet (100 mg total) by mouth as needed (take 1 tablet at onset of headache, can repeat in 2 hours if needed, max is 200 mg in 24 hours).   [DISCONTINUED] atorvastatin (LIPITOR) 80 MG tablet TAKE 1 TABLET(80 MG) BY MOUTH DAILY   [DISCONTINUED] cyanocobalamin (,VITAMIN B-12,) 1000 MCG/ML injection Inject 1,000 mcg into the muscle every 21 ( twenty-one) days.    [DISCONTINUED] Estradiol (VAGIFEM) 10 MCG TABS vaginal tablet Place 1 tablet (10 mcg total) vaginally 2 (two) times a week. Use every night before bed for two weeks when you first begin this medicine, then after the first two weeks, begin using it twice a week.   [DISCONTINUED] hydrOXYzine (ATARAX/VISTARIL) 25 MG tablet Take 25 mg by mouth every 8 (eight) hours as needed (headache).    [DISCONTINUED] levothyroxine (SYNTHROID) 50 MCG tablet Take 1 tablet (50 mcg total) by mouth daily before breakfast.   [DISCONTINUED] LINZESS 145 MCG CAPS capsule Take 145 mcg by mouth daily.     Allergies:   Naproxen, Amoxicillin-pot clavulanate, Monistat [miconazole], Sulfa antibiotics, and Tioconazole   Social History   Socioeconomic History   Marital  status: Divorced    Spouse name: Not on file   Number of children: 1   Years of education: College   Highest education level: Not on file  Occupational History    Employer: [employer]  Tobacco Use   Smoking status: Never   Smokeless tobacco: Never  Vaping Use   Vaping status: Never Used  Substance and Sexual Activity   Alcohol use: Not Currently    Comment: Rarely   Drug use: No   Sexual activity: Yes    Partners: Male    Birth control/protection: Surgical    Comment: tubal/Hyst  Other Topics Concern   Not on file  Social History Narrative   Pt lives at home with her family.   Caffeine Use: once daily   Social Determinants of Health   Financial Resource Strain: Medium Risk (04/05/2023)   Received [facility]   Overall Financial Resource Strain (CARDIA)    Difficulty of Paying Living Expenses: Somewhat hard  Food Insecurity: Food Insecurity Present (04/05/2023)   Received from [facility]   Hunger Vital Sign    Worried About Running Out of Food in the Last Year: Sometimes true    Ran Out of Food in the Last Year: Sometimes true  Transportation Needs: No Transportation Needs (04/05/2023)   Received from [facility] - Transportation    Lack of Transportation (Medical): No    Lack of Transportation (Non-Medical): No  Physical Activity: Not on file  Stress: Not on file  Social Connections: Unknown (04/15/2022)   Received from [facility]   Social Network    Social Network: Not on file     Family History: The patient's family history includes Dementia in her father; Diabetes in her mother and another family member; Heart disease in  her father and another family member; Hypertension in her father, maternal grandmother, and paternal grandmother; Psychiatric Illness in her father; Social phobia in her father; Stroke in her father, paternal grandfather, and another family member; Thyroid disease in her maternal grandmother. There is no history of Breast  cancer.  ROS:   Please see the history of present illness.     All other systems reviewed and are negative.  EKGs/Labs/Other Studies Reviewed:    The following studies were reviewed today:   EKG:        Recent Labs: 05/13/2023: ALT 22; BUN 8; Creatinine, Ser 0.95; Hemoglobin 14.0; Platelets 321; Potassium 3.8; Sodium 141  Recent Lipid Panel    Component Value Date/Time   CHOL 260 (H) 10/04/2022 0825   CHOL 161 04/22/2019 1516   TRIG 117.0 10/04/2022 0825   HDL 61.00 10/04/2022 0825   HDL 56 04/22/2019 1516   CHOLHDL 4 10/04/2022 0825   VLDL 23.4 10/04/2022 0825   LDLCALC 176 (H) 10/04/2022 0825   LDLCALC 87 04/22/2019 1516     Risk Assessment/Calculations:       Physical Exam:     Physical Exam: Blood pressure 114/80, pulse 64, height 5\' 3"  (1.6 m), weight 185 lb 12.8 oz (84.3 kg), last menstrual period 03/08/2019, SpO2 99%.       GEN:  mildly obese  in no acute distress HEENT: Normal NECK: No JVD; No carotid bruits LYMPHATICS: No lymphadenopathy CARDIAC: RRR , no murmur seen today  RESPIRATORY:  Clear to auscultation without rales, wheezing or rhonchi  ABDOMEN: Soft, non-tender, non-distended MUSCULOSKELETAL:  No edema; No deformity  SKIN: Warm and dry NEUROLOGIC:  Alert and oriented x 3    ASSESSMENT:    1. Mixed hyperlipidemia   2. Syncope, unspecified syncope type      PLAN:       TIA :   no further TIA syptoms .   She had syncope in June.  No episodes since    2.  HTN:  BP is well controlled .  Cont meds   3.  Chronic diastolic CHF:   stable    4.  Episodes of syncope :  will place a 14 day monitor   Will have her see an APP in 6 months She will call if she has any further episodes   5.  Obesity:  encouraged her to start exercising / walking   Medication Adjustments/Labs and Tests Ordered: Current medicines are reviewed at length with the patient today.  Concerns regarding medicines are outlined above.  Orders Placed This  Encounter  Procedures   Lipid panel   Basic metabolic panel   LONG TERM MONITOR (3-14 DAYS)   Meds ordered this encounter  Medications   atorvastatin (LIPITOR) 80 MG tablet    Sig: Take 1 tablet (80 mg total) by mouth daily.    Dispense:  90 tablet    Refill:  3    Patient Instructions  Medication Instructions:  Your physician recommends that you continue on your current medications as directed. Please refer to the Current Medication list given to you today.  *If you need a refill on your cardiac medications before your next appointment, please call your pharmacy*   Lab Work: Lipids, BMET today If you have labs (blood work) drawn today and your tests are completely normal, you will receive your results only by: MyChart Message (if you have MyChart) OR A paper copy in the mail If you have any lab test that is abnormal  or we need to change your treatment, we will call you to review the results.   Testing/Procedures: 14 day zio monitor Your physician has recommended that you wear an event monitor. Event monitors are medical devices that record the heart's electrical activity. Doctors most often [place] these monitors to diagnose arrhythmias. Arrhythmias are problems with the speed or rhythm of the heartbeat. The monitor is a small, portable device. You can wear one while you do your normal daily activities. This is usually used to diagnose what is causing palpitations/syncope (passing out).  Follow-Up: At [facility], you and your health needs are our priority.  As part of our continuing mission to provide you with exceptional heart care, we have created designated Provider Care Teams.  These Care Teams include your primary Cardiologist (physician) and Advanced Practice Providers (APPs -  Physician Assistants and Nurse Practitioners) who all work together to provide you with the care you need, when you need it.  We recommend signing up for the patient portal called "MyChart".   Sign up information is provided on this After Visit Summary.  MyChart is used to connect with patients for Virtual Visits (Telemedicine).  Patients are able to view lab/test results, encounter notes, upcoming appointments, etc.  Non-urgent messages can be sent to your provider as well.   To learn more about what you can do with MyChart, go to [email or web address].    Your next appointment:   6 month(s)  Provider:   Kristeen Miss, MD      Other Instructions Megan Davila- Long Term Monitor Instructions  Your physician has requested you wear a ZIO patch monitor for 14 days.  This is a single patch monitor. [employer] supplies one patch monitor per enrollment. Additional stickers are not available. Please do not apply patch if you will be having a Nuclear Stress Test,  Echocardiogram, Cardiac CT, MRI, or Chest Xray during the period you would be wearing the  monitor. The patch cannot be worn during these tests. You cannot remove and re-apply the  ZIO XT patch monitor.  Your ZIO patch monitor will be mailed 3 day USPS to your address on file. It may take 3-5 days  to receive your monitor after you have been enrolled.  Once you have received your monitor, please review the enclosed instructions. Your monitor  has already been registered assigning a specific monitor serial # to you.  Billing and Patient Assistance Program Information  We have supplied [employer] with any of your insurance information on file for billing purposes. [employer] offers a sliding scale Patient Assistance Program for patients that do not have  insurance, or whose insurance does not completely cover the cost of the ZIO monitor.  You must apply for the Patient Assistance Program to qualify for this discounted rate.  To apply, please call [employer] at [phone], select option 4, select option 2, ask to apply for  Patient Assistance Program. Megan Davila will ask your household income, and how many people  are in your household. They  will quote your out-of-pocket cost based on that information.  [employer] will also be able to set up a 3-month, interest-free payment plan if needed.  Applying the monitor   Shave hair from upper left chest.  Hold abrader disc by orange tab. Rub abrader in 40 strokes over the upper left chest as  indicated in your monitor instructions.  Clean area with 4 enclosed alcohol pads. Let dry.  Apply patch as indicated in monitor instructions. Patch will be placed  under collarbone on left  side of chest with arrow pointing upward.  Rub patch adhesive wings for 2 minutes. Remove white label marked "1". Remove the white  label marked "2". Rub patch adhesive wings for 2 additional minutes.  While looking in a mirror, press and release button in center of patch. A small green light will  flash 3-4 times. This will be your only indicator that the monitor has been turned on.  Do not shower for the first 24 hours. You may shower after the first 24 hours.  Press the button if you feel a symptom. You will hear a small click. Record Date, Time and  Symptom in the Patient Logbook.  When you are ready to remove the patch, follow instructions on the last 2 pages of Patient  Logbook. Stick patch monitor onto the last page of Patient Logbook.  Place Patient Logbook in the blue and white box. Use locking tab on box and tape box closed  securely. The blue and white box has prepaid postage on it. Please place it in the mailbox as  soon as possible. Your physician should have your test results approximately 7 days after the  monitor has been mailed back to [facility].  Call [facility] Customer Care at [phone] if you have questions regarding  your ZIO XT patch monitor. Call them immediately if you see an orange light blinking on your  monitor.  If your monitor falls off in less than 4 days, contact our Monitor department at [phone].  If your monitor becomes loose or falls off after 4 days call  [employer] at [phone] for  suggestions on securing your monitor    Signed, Kristeen Miss, MD  06/27/2023 8:51 AM    [facility]

## 2023-06-27 ENCOUNTER — Ambulatory Visit (INDEPENDENT_AMBULATORY_CARE_PROVIDER_SITE_OTHER): Payer: 59

## 2023-06-27 ENCOUNTER — Ambulatory Visit: Payer: 59 | Admitting: Cardiovascular Disease

## 2023-06-27 ENCOUNTER — Encounter: Payer: Self-pay | Admitting: Cardiovascular Disease

## 2023-06-27 VITALS — BP 114/80 | HR 64 | Ht 63.0 in | Wt 185.8 lb

## 2023-06-27 DIAGNOSIS — R55 Syncope and collapse: Secondary | ICD-10-CM

## 2023-06-27 DIAGNOSIS — E782 Mixed hyperlipidemia: Secondary | ICD-10-CM | POA: Diagnosis not present

## 2023-06-27 MED ORDER — ATORVASTATIN CALCIUM 80 MG PO TABS: 80.0000 mg | ORAL_TABLET | Freq: Every day | ORAL | 3 refills | Status: DC

## 2023-06-27 NOTE — Patient Instructions (Signed)
Medication Instructions:  Your physician recommends that you continue on your current medications as directed. Please refer to the Current Medication list given to you today.  *If you need a refill on your cardiac medications before your next appointment, please call your pharmacy*   Lab Work: Lipids, BMET today If you have labs (blood work) drawn today and your tests are completely normal, you will receive your results only by: MyChart Message (if you have MyChart) OR A paper copy in the mail If you have any lab test that is abnormal or we need to change your treatment, we will call you to review the results.   Testing/Procedures: 14 day zio monitor Your physician has recommended that you wear an event monitor. Event monitors are medical devices that record the heart's electrical activity. Doctors most often [place] these monitors to diagnose arrhythmias. Arrhythmias are problems with the speed or rhythm of the heartbeat. The monitor is a small, portable device. You can wear one while you do your normal daily activities. This is usually used to diagnose what is causing palpitations/syncope (passing out).  Follow-Up: At [facility], you and your health needs are our priority.  As part of our continuing mission to provide you with exceptional heart care, we have created designated Provider Care Teams.  These Care Teams include your primary Cardiologist (physician) and Advanced Practice Providers (APPs -  Physician Assistants and Nurse Practitioners) who all work together to provide you with the care you need, when you need it.  We recommend signing up for the patient portal called "MyChart".  Sign up information is provided on this After Visit Summary.  MyChart is used to connect with patients for Virtual Visits (Telemedicine).  Patients are able to view lab/test results, encounter notes, upcoming appointments, etc.  Non-urgent messages can be sent to your provider as well.   To learn more  about what you can do with MyChart, go to [email or web address].    Your next appointment:   6 month(s)  Provider:   Kristeen Miss, MD      Other Instructions Christena Deem- Long Term Monitor Instructions  Your physician has requested you wear a ZIO patch monitor for 14 days.  This is a single patch monitor. [employer] supplies one patch monitor per enrollment. Additional stickers are not available. Please do not apply patch if you will be having a Nuclear Stress Test,  Echocardiogram, Cardiac CT, MRI, or Chest Xray during the period you would be wearing the  monitor. The patch cannot be worn during these tests. You cannot remove and re-apply the  ZIO XT patch monitor.  Your ZIO patch monitor will be mailed 3 day USPS to your address on file. It may take 3-5 days  to receive your monitor after you have been enrolled.  Once you have received your monitor, please review the enclosed instructions. Your monitor  has already been registered assigning a specific monitor serial # to you.  Billing and Patient Assistance Program Information  We have supplied [employer] with any of your insurance information on file for billing purposes. [employer] offers a sliding scale Patient Assistance Program for patients that do not have  insurance, or whose insurance does not completely cover the cost of the ZIO monitor.  You must apply for the Patient Assistance Program to qualify for this discounted rate.  To apply, please call [employer] at [phone], select option 4, select option 2, ask to apply for  Patient Assistance Program. Meredeth Ide will ask your household income,  and how many people  are in your household. They will quote your out-of-pocket cost based on that information.  [employer] will also be able to set up a 32-month, interest-free payment plan if needed.  Applying the monitor   Shave hair from upper left chest.  Hold abrader disc by orange tab. Rub abrader in 40 strokes over the upper left chest as   indicated in your monitor instructions.  Clean area with 4 enclosed alcohol pads. Let dry.  Apply patch as indicated in monitor instructions. Patch will be placed under collarbone on left  side of chest with arrow pointing upward.  Rub patch adhesive wings for 2 minutes. Remove white label marked "1". Remove the white  label marked "2". Rub patch adhesive wings for 2 additional minutes.  While looking in a mirror, press and release button in center of patch. A small green light will  flash 3-4 times. This will be your only indicator that the monitor has been turned on.  Do not shower for the first 24 hours. You may shower after the first 24 hours.  Press the button if you feel a symptom. You will hear a small click. Record Date, Time and  Symptom in the Patient Logbook.  When you are ready to remove the patch, follow instructions on the last 2 pages of Patient  Logbook. Stick patch monitor onto the last page of Patient Logbook.  Place Patient Logbook in the blue and white box. Use locking tab on box and tape box closed  securely. The blue and white box has prepaid postage on it. Please place it in the mailbox as  soon as possible. Your physician should have your test results approximately 7 days after the  monitor has been mailed back to [facility].  Call [facility] Customer Care at [phone] if you have questions regarding  your ZIO XT patch monitor. Call them immediately if you see an orange light blinking on your  monitor.  If your monitor falls off in less than 4 days, contact our Monitor department at [phone].  If your monitor becomes loose or falls off after 4 days call [employer] at [phone] for  suggestions on securing your monitor

## 2023-06-27 NOTE — Progress Notes (Unsigned)
Enrolled for [employer] to mail a ZIO XT long term holter monitor to the patients address on file.  

## 2023-06-28 LAB — LIPID PANEL
Chol/HDL Ratio: 4 ratio (ref 0.0–4.4)
Cholesterol, Total: 206 mg/dL — ABNORMAL HIGH (ref 100–199)
HDL: 51 mg/dL (ref >39)
LDL Chol Calc (NIH): 128 mg/dL — ABNORMAL HIGH (ref 0–99)
Triglycerides: 150 mg/dL — ABNORMAL HIGH (ref 0–149)
VLDL Cholesterol Cal: 27 mg/dL (ref 5–40)

## 2023-06-28 LAB — BASIC METABOLIC PANEL
BUN/Creatinine Ratio: 8 — ABNORMAL LOW (ref 9–23)
BUN: 7 mg/dL (ref 6–24)
CO2: 25 mmol/L (ref 20–29)
Calcium: 9.7 mg/dL (ref 8.7–10.2)
Chloride: 103 mmol/L (ref 96–106)
Creatinine, Ser: 0.86 mg/dL (ref 0.57–1.00)
Glucose: 110 mg/dL — ABNORMAL HIGH (ref 70–99)
Potassium: 4.1 mmol/L (ref 3.5–5.2)
Sodium: 143 mmol/L (ref 134–144)
eGFR: 80 mL/min/{1.73_m2} (ref >59)

## 2023-06-29 DIAGNOSIS — M25572 Pain in left ankle and joints of left foot: Secondary | ICD-10-CM | POA: Insufficient documentation

## 2023-08-15 DIAGNOSIS — E782 Mixed hyperlipidemia: Secondary | ICD-10-CM | POA: Diagnosis not present

## 2023-08-15 DIAGNOSIS — R55 Syncope and collapse: Secondary | ICD-10-CM

## 2023-08-22 ENCOUNTER — Ambulatory Visit: Payer: 59 | Admitting: Neurology

## 2023-08-22 DIAGNOSIS — G43709 Chronic migraine without aura, not intractable, without status migrainosus: Secondary | ICD-10-CM | POA: Diagnosis not present

## 2023-08-22 MED ORDER — ONABOTULINUMTOXINA 200 UNITS IJ SOLR
155.0000 [IU] | Freq: Once | INTRAMUSCULAR | Status: AC
Start: 2023-08-22 — End: 2023-08-22
  Administered 2023-08-22: 155 [IU] via INTRAMUSCULAR

## 2023-08-22 NOTE — Progress Notes (Signed)
Botox- 200 units x 1 vial Lot: [id number] Expiration: 11/2025  NDC: 4782-9562-13  Bacteriostatic 0.9% Sodium Chloride- 4 mL  Lot: [id number] Expiration: 08/05/2024 NDC: 4696-2952-84  Dx: X32.440 .B/B Witnessed by [employer]

## 2023-08-22 NOTE — Progress Notes (Addendum)
BOTOX PROCEDURE NOTE FOR MIGRAINE HEADACHE  Addendum 09/18/2023: Vyepti 100mg  not effective, will increase to 300 mg  08/22/2023: she fell and has a cast on her foot. She had on vyepti treatment. Still early to assess clinical improvement there is stress, she is on temporary leave/disability due to her foot and it is stressful.   05/23/2023: 2nd botox injection but long time inbetween so like starting over again; 3 months sarah, we can alternate I can do the next one. [facility]. Patient would like trigger point injections and we do not provide thoe on a regular basis. Patient's baseline was daily migraines, botox gives her 50% relief in migraine freq and severity. Of pulsating/pounding/throbbing headaches with nausea, photo/phonophobia that last 24 hours of moderate to severe pain. But even with 50% relief she still has 15 migraine days a month and we would like to start vyepti. No medication overuse.  Current and past medications: reviewed chart ANALGESICS: Tylenol, Excedrin ANTI-MIGRAINE: Zomig, Relpax, Maxalt HEART/BP: Verapamil DECONGESTANT/ANTIHISTAMINE: ANTI-NAUSEANT: Phenergan NSAIDS: Aleve, Naproxen MUSCLE RELAXANTS: Tizanidine ANTI-CONVULSANTS: Topamax, Zonegran, Depakote, gabapentin STEROIDS: SLEEPING PILLS/TRANQUILIZERS: ANTI-DEPRESSANTS: impramine (dry mouth), effexor, Prozac, wellbutrin HERBAL: FIBROMYALGIA: HORMONAL: OTHER: Aimovig, (slightly helpful), Ajovy, emgality PROCEDURES FOR HEADACHES: Botox, TPI, SPG, DHE infusion   No orders of the defined types were placed in this encounter.  No orders of the defined types were placed in this encounter.   HISTORY 12/29/2022: Here today for Botox injection, her headaches had been under good control. She is past-due, last was around October. Was going to the [facility], it closed. 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 She takes Xanax as needed, she took 1 before  coming here today. Has daily migraine headaches right now. Even with Botox, gets 15 migraines a month.   She brought a grocery bag full of medications, for migraines: gabapentin 900 mg at bedtime, Effexor XR 150 mg 2 in the AM, Zonegran 250 mg at bedtime, Relpax as needed.   Reports in between the Botox she got trigger point injections.   Description of procedure:  The patient was placed in a sitting position. The standard protocol was used for Botox as follows, with 5 units of Botox injected at each site:  -Procerus muscle, midline injection  -Corrugator muscle, bilateral injection  -Frontalis muscle, bilateral injection, with 2 sites each side, medial injection was performed in the upper one third of the frontalis muscle, in the region vertical from the medial inferior edge of the superior orbital rim. The lateral injection was again in the upper one third of the forehead vertically above the lateral limbus of the cornea, 1.5 cm lateral to the medial injection site.  -Temporalis muscle injection, 4 sites, bilaterally. The first injection was 3 cm above the tragus of the ear, second injection site was 1.5 cm to 3 cm up from the first injection site in line with the tragus of the ear. The third injection site was 1.5-3 cm forward between the first 2 injection sites. The fourth injection site was 1.5 cm posterior to the second injection site.  -Occipitalis muscle injection, 3 sites, bilaterally. The first injection was done one half way between the occipital protuberance and the tip of the mastoid process behind the ear. The second injection site was done lateral and superior to the first, 1 fingerbreadth from the first injection. The third injection site was 1 fingerbreadth superiorly and medially from the first injection site.  -Cervical paraspinal muscle injection, 2 sites,  bilateral, the first injection site was 1 cm from the midline of the cervical spine, 3 cm inferior to the lower border of  the occipital protuberance. The second injection site was 1.5 cm superiorly and laterally to the first injection site.  -Trapezius muscle injection was performed at 3 sites, bilaterally. The first injection site was in the upper trapezius muscle halfway between the inflection point of the neck, and the acromion. The second injection site was one half way between the acromion and the first injection site. The third injection was done between the first injection site and the inflection point of the neck.  A 200 unit bottle of Botox was used, 155 units were injected, the rest of the Botox was wasted. The patient tolerated the procedure well, there were no complications of the above procedure.  Botox NDC 8295-6213-08 Lot number [id number] Expiration date 05/2025 SP  I reviewed her medications for migraines. I refilled her Bernita Raisin but I increased it to 100 mg. She has appointment 01/23/23 with Dr. Lucia Gaskins. She is more clear headed today than it sounds like she was last week, but she did report she got lost on the way here. She took a Xanax before coming today. 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

## 2023-09-02 ENCOUNTER — Other Ambulatory Visit: Payer: Self-pay | Admitting: Obstetrics and Gynecology

## 2023-09-02 DIAGNOSIS — R3915 Urgency of urination: Secondary | ICD-10-CM

## 2023-09-07 ENCOUNTER — Other Ambulatory Visit: Payer: Self-pay | Admitting: Obstetrics and Gynecology

## 2023-11-20 ENCOUNTER — Telehealth: Payer: Self-pay | Admitting: Neurology

## 2023-11-20 NOTE — Telephone Encounter (Signed)
Pt confirming appt time

## 2023-11-21 ENCOUNTER — Ambulatory Visit: Payer: 59 | Admitting: Neurology

## 2023-11-21 VITALS — BP 139/74 | HR 103

## 2023-11-21 DIAGNOSIS — G43709 Chronic migraine without aura, not intractable, without status migrainosus: Secondary | ICD-10-CM

## 2023-11-21 MED ORDER — UBRELVY 100 MG PO TABS: 100.0000 mg | ORAL_TABLET | ORAL | 11 refills | Status: DC | PRN

## 2023-11-21 MED ORDER — ONDANSETRON HCL 4 MG PO TABS: 4.0000 mg | ORAL_TABLET | Freq: Three times a day (TID) | ORAL | 5 refills | Status: DC | PRN

## 2023-11-21 MED ORDER — ONABOTULINUMTOXINA 200 UNITS IJ SOLR
155.0000 [IU] | Freq: Once | INTRAMUSCULAR | Status: AC
Start: 2023-11-21 — End: 2023-11-21
  Administered 2023-11-21: 155 [IU] via INTRAMUSCULAR

## 2023-11-21 NOTE — Progress Notes (Signed)
BOTOX PROCEDURE NOTE FOR MIGRAINE HEADACHE  HISTORY: Here for Botox, last was 08/22/23 with Dr.Ahern.  reports still having 4 migraines a week, was having daily. Are less severe, doesn't last as long. Will use relpax, ubrelvy for rescue. Rotates them. Has zofran PRN. Takes gabapentin 300 mg TID. Remains on Vyepti, next infusion is 12/18/23 will be 300 mg this will be first time with higher dosing. Is really pleased with Botox + Vyepti.   Description of procedure:  The patient was placed in a sitting position. The standard protocol was used for Botox as follows, with 5 units of Botox injected at each site:   -Procerus muscle, midline injection  -Corrugator muscle, bilateral injection  -Frontalis muscle, bilateral injection, with 2 sites each side, medial injection was performed in the upper one third of the frontalis muscle, in the region vertical from the medial inferior edge of the superior orbital rim. The lateral injection was again in the upper one third of the forehead vertically above the lateral limbus of the cornea, 1.5 cm lateral to the medial injection site.  -Temporalis muscle injection, 4 sites, bilaterally. The first injection was 3 cm above the tragus of the ear, second injection site was 1.5 cm to 3 cm up from the first injection site in line with the tragus of the ear. The third injection site was 1.5-3 cm forward between the first 2 injection sites. The fourth injection site was 1.5 cm posterior to the second injection site.  -Occipitalis muscle injection, 3 sites, bilaterally. The first injection was done one half way between the occipital protuberance and the tip of the mastoid process behind the ear. The second injection site was done lateral and superior to the first, 1 fingerbreadth from the first injection. The third injection site was 1 fingerbreadth superiorly and medially from the first injection site.  -Cervical paraspinal muscle injection, 2 sites, bilateral, the  first injection site was 1 cm from the midline of the cervical spine, 3 cm inferior to the lower border of the occipital protuberance. The second injection site was 1.5 cm superiorly and laterally to the first injection site.  -Trapezius muscle injection was performed at 3 sites, bilaterally. The first injection site was in the upper trapezius muscle halfway between the inflection point of the neck, and the acromion. The second injection site was one half way between the acromion and the first injection site. The third injection was done between the first injection site and the inflection point of the neck.   A 200 unit bottle of Botox was used, 155 units were injected, the rest of the Botox was wasted. The patient tolerated the procedure well, there were no complications of the above procedure.  Botox NDC [phone] Lot number [id number] Expiration date 02/2026 BB  Meds ordered this encounter  Medications   botulinum toxin Type A (BOTOX) injection 155 Units   Ubrogepant (UBRELVY) 100 MG TABS    Sig: Take 1 tablet (100 mg total) by mouth as needed (take 1 tablet at onset of headache, can repeat in 2 hours if needed, max is 200 mg in 24 hours).    Dispense:  16 tablet    Refill:  11   ondansetron (ZOFRAN) 4 MG tablet    Sig: Take 1 tablet (4 mg total) by mouth every 8 (eight) hours as needed for nausea or vomiting.    Dispense:  20 tablet    Refill:  5   Her next dose of Vyepti is in  January 2025, will be the 300 mg dosing, intra fusion has to get authorization from insurance.

## 2023-11-21 NOTE — Progress Notes (Signed)
Botox- 200 units x 1vial Lot: [id number] Expiration: 02/2026 NDC: 4540-9811-91  Bacteriostatic 0.9% Sodium Chloride- 4mL total [id number]  Expiration: 10/05/2024 NDC: 3086-5784-69  Dx: G43.709 BB  Witnessed by: Idelle Leech

## 2024-02-27 ENCOUNTER — Ambulatory Visit: Payer: 59 | Admitting: Neurology

## 2024-02-27 DIAGNOSIS — G43709 Chronic migraine without aura, not intractable, without status migrainosus: Secondary | ICD-10-CM

## 2024-02-27 MED ORDER — ONABOTULINUMTOXINA 100 UNITS IJ SOLR
200.0000 [IU] | Freq: Once | INTRAMUSCULAR | Status: AC
Start: 2024-02-27 — End: 2024-02-27
  Administered 2024-02-27: 200 [IU] via INTRAMUSCULAR

## 2024-02-27 NOTE — Progress Notes (Signed)
   BOTOX PROCEDURE NOTE FOR MIGRAINE HEADACHE   HISTORY: Megan Davila is here for Botox.  Last was 11/21/2023 with me. A lot of stress lately with work.  Having about 3 migraines a week, was having 4 at last visit. Last infusion of Vyepti was in Jan 2025 this was 1st time with higher dose 300 mg, due in April 2025. Uses Relpax or Ubrelvy rotates, has Zofran PRN. Her refills should be current.   Description of procedure:  The patient was placed in a sitting position. The standard protocol was used for Botox as follows, with 5 units of Botox injected at each site:   -Procerus muscle, midline injection  -Corrugator muscle, bilateral injection  -Frontalis muscle, bilateral injection, with 2 sites each side, medial injection was performed in the upper one third of the frontalis muscle, in the region vertical from the medial inferior edge of the superior orbital rim. The lateral injection was again in the upper one third of the forehead vertically above the lateral limbus of the cornea, 1.5 cm lateral to the medial injection site.  -Temporalis muscle injection, 4 sites, bilaterally. The first injection was 3 cm above the tragus of the ear, second injection site was 1.5 cm to 3 cm up from the first injection site in line with the tragus of the ear. The third injection site was 1.5-3 cm forward between the first 2 injection sites. The fourth injection site was 1.5 cm posterior to the second injection site.  -Occipitalis muscle injection, 3 sites, bilaterally. The first injection was done one half way between the occipital protuberance and the tip of the mastoid process behind the ear. The second injection site was done lateral and superior to the first, 1 fingerbreadth from the first injection. The third injection site was 1 fingerbreadth superiorly and medially from the first injection site.  -Cervical paraspinal muscle injection, 2 sites, bilateral, the first injection site was 1 cm from the midline  of the cervical spine, 3 cm inferior to the lower border of the occipital protuberance. The second injection site was 1.5 cm superiorly and laterally to the first injection site.  -Trapezius muscle injection was performed at 3 sites, bilaterally. The first injection site was in the upper trapezius muscle halfway between the inflection point of the neck, and the acromion. The second injection site was one half way between the acromion and the first injection site. The third injection was done between the first injection site and the inflection point of the neck.   A 200 unit bottle of Botox was used, 155 units were injected, the rest of the Botox was wasted. The patient tolerated the procedure well, there were no complications of the above procedure.  Botox NDC 1610-9604-54 Lot number [id number] Expiration date 04/2026 BB  Her next Vyepti 300 mg is due in April 2025.  We will continue Botox every 3 months.  She will follow-up virtually with Dr. Lucia Gaskins in 6 months for migraine check in.

## 2024-02-27 NOTE — Progress Notes (Signed)
 Botox- 100 units x 2 vial Lot: [id number] Expiration: 03/2026 Lot: [id number] Exp: 04/2026 NDC: 2956-2130-86  Bacteriostatic 0.9% Sodium Chloride-  4 mL  Lot: [id number] Expiration: 10/05/2024 NDC: 6295-2841-32  Dx: G40.102 B/B X 2 Witnessed by April J RN

## 2024-03-13 ENCOUNTER — Encounter: Payer: Self-pay | Admitting: Obstetrics and Gynecology

## 2024-03-14 ENCOUNTER — Encounter: Payer: Self-pay | Admitting: Obstetrics and Gynecology

## 2024-04-23 ENCOUNTER — Telehealth: Payer: Self-pay | Admitting: Neurology

## 2024-04-23 NOTE — Telephone Encounter (Signed)
 Submitted Botox  auth renewal request via [employer] portal, received approval. Pt will continue to be B/B.  Auth#: [id number] (04/23/24-04/23/25)

## 2024-05-23 ENCOUNTER — Ambulatory Visit: Admitting: Neurology

## 2024-05-23 ENCOUNTER — Encounter: Payer: Self-pay | Admitting: Neurology

## 2024-05-23 VITALS — BP 136/72 | Ht 63.0 in | Wt 200.0 lb

## 2024-05-23 DIAGNOSIS — G43709 Chronic migraine without aura, not intractable, without status migrainosus: Secondary | ICD-10-CM | POA: Diagnosis not present

## 2024-05-23 MED ORDER — ONABOTULINUMTOXINA 100 UNITS IJ SOLR
155.0000 [IU] | Freq: Once | INTRAMUSCULAR | Status: AC
Start: 2024-05-23 — End: 2024-05-23
  Administered 2024-05-23: 155 [IU] via INTRAMUSCULAR

## 2024-05-23 MED ORDER — PREDNISONE 5 MG PO TABS: ORAL_TABLET | ORAL | 0 refills | Status: DC

## 2024-05-23 NOTE — Progress Notes (Signed)
 Botox - 200 units x 1 vial Lot: [id number] Expiration: 10/2026 NDC: 2130-8657-84  Bacteriostatic 0.9% Sodium Chloride - 4 mL  Lot: [id number] Expiration: 06/03/2025 NDC: 2841-3244-01  Dx: U27.253 B/B Witnessed by Ralston Burkes, CMA

## 2024-05-23 NOTE — Progress Notes (Signed)
   BOTOX  PROCEDURE NOTE FOR MIGRAINE HEADACHE  HISTORY: OVA GILLENTINE is here for Botox . Last was 02/27/24 with me. A lot of weather change in this cycle of Botox . For the last month, Botox  has worn off. Was doing better in April and May, lately has been having migraine daily. Has missed the last 2 days. Had Vypeti 300 mg in April, is really liking the Vyepti. Has Relpax  and Ubrelvy  PRN for migraine, rotates them.   Description of procedure:  The patient was placed in a sitting position. The standard protocol was used for Botox  as follows, with 5 units of Botox  injected at each site:  -Procerus muscle, midline injection  -Corrugator muscle, bilateral injection  -Frontalis muscle, bilateral injection, with 2 sites each side, medial injection was performed in the upper one third of the frontalis muscle, in the region vertical from the medial inferior edge of the superior orbital rim. The lateral injection was again in the upper one third of the forehead vertically above the lateral limbus of the cornea, 1.5 cm lateral to the medial injection site.  -Temporalis muscle injection, 4 sites, bilaterally. The first injection was 3 cm above the tragus of the ear, second injection site was 1.5 cm to 3 cm up from the first injection site in line with the tragus of the ear. The third injection site was 1.5-3 cm forward between the first 2 injection sites. The fourth injection site was 1.5 cm posterior to the second injection site.  -Occipitalis muscle injection, 3 sites, bilaterally. The first injection was done one half way between the occipital protuberance and the tip of the mastoid process behind the ear. The second injection site was done lateral and superior to the first, 1 fingerbreadth from the first injection. The third injection site was 1 fingerbreadth superiorly and medially from the first injection site.  -Cervical paraspinal muscle injection, 2 sites, bilateral, the first injection site was 1 cm  from the midline of the cervical spine, 3 cm inferior to the lower border of the occipital protuberance. The second injection site was 1.5 cm superiorly and laterally to the first injection site.  -Trapezius muscle injection was performed at 3 sites, bilaterally. The first injection site was in the upper trapezius muscle halfway between the inflection point of the neck, and the acromion. The second injection site was one half way between the acromion and the first injection site. The third injection was done between the first injection site and the inflection point of the neck.   A 200 unit bottle of Botox  was used, 155 units were injected, the rest of the Botox  was wasted. The patient tolerated the procedure well, there were no complications of the above procedure.  Botox  NDC 4098-1191-47 Lot number [id number] Expiration date 10/2026 BB  I sent in a 5 mg prednisone  taper to start this weekend for prolonged migraine.   Meds ordered this encounter  Medications   botulinum toxin Type A  (BOTOX ) injection 155 Units    Botox - 200 units x 1 vial Lot: [id number] Expiration: 10/2026 NDC: 9629-5284-13  Bacteriostatic 0.9% Sodium Chloride - 4 mL  Lot: [id number] Expiration: 06/03/2025 NDC: 2725-3664-40  Dx: H47.425 B/B Witnessed by Ralston Burkes, CMA   predniSONE  (DELTASONE ) 5 MG tablet    Sig: Start taking 6 tablets taper by 1 tablet daily until off    Dispense:  21 tablet    Refill:  0

## 2024-05-29 ENCOUNTER — Ambulatory Visit: Admitting: Neurology

## 2024-06-12 ENCOUNTER — Other Ambulatory Visit (HOSPITAL_COMMUNITY): Payer: Self-pay

## 2024-06-20 DIAGNOSIS — M542 Cervicalgia: Secondary | ICD-10-CM | POA: Insufficient documentation

## 2024-06-21 DIAGNOSIS — M47812 Spondylosis without myelopathy or radiculopathy, cervical region: Secondary | ICD-10-CM | POA: Insufficient documentation

## 2024-06-27 ENCOUNTER — Encounter (HOSPITAL_BASED_OUTPATIENT_CLINIC_OR_DEPARTMENT_OTHER): Payer: Self-pay | Admitting: Emergency Medicine

## 2024-06-27 ENCOUNTER — Other Ambulatory Visit (HOSPITAL_BASED_OUTPATIENT_CLINIC_OR_DEPARTMENT_OTHER): Payer: Self-pay

## 2024-06-27 ENCOUNTER — Emergency Department (HOSPITAL_BASED_OUTPATIENT_CLINIC_OR_DEPARTMENT_OTHER)

## 2024-06-27 ENCOUNTER — Other Ambulatory Visit: Payer: Self-pay

## 2024-06-27 ENCOUNTER — Emergency Department (HOSPITAL_BASED_OUTPATIENT_CLINIC_OR_DEPARTMENT_OTHER)
Admission: EM | Admit: 2024-06-27 | Discharge: 2024-06-27 | Disposition: A | Discharge status: Home / Self Care | Source: Ambulatory Visit | Attending: Emergency Medicine | Admitting: Emergency Medicine

## 2024-06-27 DIAGNOSIS — S93401A Sprain of unspecified ligament of right ankle, initial encounter: Secondary | ICD-10-CM | POA: Insufficient documentation

## 2024-06-27 DIAGNOSIS — X501XXA Overexertion from prolonged static or awkward postures, initial encounter: Secondary | ICD-10-CM | POA: Diagnosis not present

## 2024-06-27 DIAGNOSIS — S99911A Unspecified injury of right ankle, initial encounter: Secondary | ICD-10-CM | POA: Diagnosis present

## 2024-06-27 DIAGNOSIS — Z7982 Long term (current) use of aspirin: Secondary | ICD-10-CM | POA: Diagnosis not present

## 2024-06-27 DIAGNOSIS — M25579 Pain in unspecified ankle and joints of unspecified foot: Secondary | ICD-10-CM | POA: Insufficient documentation

## 2024-06-27 MED ORDER — OXYCODONE HCL 5 MG PO TABS
5.0000 mg | ORAL_TABLET | Freq: Four times a day (QID) | ORAL | 0 refills | Status: DC | PRN
  Filled 2024-06-27: qty 10, 3d supply, fill #0

## 2024-06-27 MED ORDER — OXYCODONE HCL 5 MG PO TABS
5.0000 mg | ORAL_TABLET | Freq: Once | ORAL | Status: AC
  Administered 2024-06-27: 5 mg via ORAL
  Filled 2024-06-27: qty 1

## 2024-06-27 NOTE — Discharge Planning (Signed)
 Sam Devonshire, BSN, RN, [facility] [phone] Pt qualifies for DME (Durable Medical Equipment) wheelchair.  DME  ordered through [employer].  Jermaine of [employer] notified to deliver DME to pt home.      Durable Medical Equipment  (From admission, onward)           Start     Ordered   06/27/24 1017  For home use only DME wheelchair cushion (seat and back)  Once        06/27/24 1017

## 2024-06-27 NOTE — ED Notes (Signed)
 Pt d/c instructions, medications, and follow-up care reviewed with pt. Pt verbalized understanding and had no further questions at time of d/c. Pt CA&Ox4 and in NAD at time of d/c. Pt assisted to order [facility] for transportation home. Pt escorted and assisted into [facility] when [place] arrived.

## 2024-06-27 NOTE — ED Provider Notes (Signed)
 Patient will benefit from wheelchair given some chronic left ankle issues and poor ambulation at baseline.  Now with new right ankle sprain and injury to get around long distances I think she benefit greatly from a wheelchair.   Ruthe Cornet, DO 06/27/24 1026

## 2024-06-27 NOTE — ED Provider Notes (Signed)
 [place] [facility]PARTMENT AT [facility] Provider Note   CSN: [id number] Arrival date & time: 06/27/24  1003     Patient presents with: Megan Davila is a 58 y.o. female.   The history is provided by the patient.  Ankle Pain Location:  Ankle Time since incident:  1 day Ankle location:  R ankle Pain details:    Quality:  Aching   Radiates to:  Does not radiate   Severity:  Mild   Onset quality:  Gradual   Timing:  Constant   Progression:  Unchanged Chronicity:  New Relieved by:  Nothing Worsened by:  Bearing weight Associated symptoms: no back pain, no decreased ROM, no fatigue, no fever, no itching, no muscle weakness, no neck pain, no numbness, no stiffness, no swelling and no tingling   Associated symptoms comment:  Rolled right ankle today       Prior to Admission medications   Medication Sig Start Date End Date Taking? Authorizing Provider  oxyCODONE  (ROXICODONE ) 5 MG immediate release tablet Take 1 tablet (5 mg total) by mouth every 6 (six) hours as needed for up to 10 doses. 06/27/24  Yes Nichollas Perusse, DO  acetaminophen  (TYLENOL ) 325 MG tablet 1 tablet as needed    [provider]  ALPRAZolam  (XANAX ) 1 MG tablet Take 1 mg by mouth 2 (two) times daily as needed. Anxiety    [provider]  APLENZIN  348 MG TB24 Take 1 tablet by mouth every morning. 06/30/22   [provider]  Armodafinil  250 MG tablet Take 250 mg by mouth every morning. 05/24/18   [provider]  aspirin EC 81 MG tablet Take 81 mg by mouth daily. Swallow whole.    [provider]  atorvastatin  (LIPITOR) 80 MG tablet Take 1 tablet (80 mg total) by mouth daily. 06/27/23   Nahser, Aleene PARAS, MD  Botulinum Toxin Type A  (BOTOX ) 200 units SOLR INJECT UP TO 200 UNITS  INTRAMUSCULARLY TO HEAD,  NECK, AND SHOULDERS EVERY  12 WEEKS (GIVEN AT MD  OFFICE, DISCARD UNUSED) 01/01/21   [provider]  Calcium -Magnesium-Vitamin D  [phone]  MG-MG-UNIT TABS 1 tablet with food Orally Three times a day 08/06/15   [provider]  diphenhydrAMINE  (BENADRYL ) 12.5 MG/5ML elixir Take 12.5 mg by mouth every 6 (six) hours as needed for allergies.     [provider]  eletriptan  (RELPAX ) 40 MG tablet Take 1 tablet (40 mg total) by mouth as needed for migraine or headache. May repeat in 2 hours if needed. Max 2 tabs per day or 8 tabs per month. 05/23/23   Ines Onetha NOVAK, MD  famotidine (PEPCID) 20 MG tablet as needed. 06/09/20   [provider]  fexofenadine  (ALLEGRA) 180 MG tablet Take 180 mg by mouth daily as needed. allergies    [provider]  fluticasone  (FLONASE ) 50 MCG/ACT nasal spray Place 1 spray into both nostrils as needed for allergies.  01/17/17   [provider]  gabapentin  (NEURONTIN ) 300 MG capsule Take 1 capsule (300 mg total) by mouth 3 (three) times daily. 05/23/23   Ines Onetha NOVAK, MD  lansoprazole (PREVACID) 15 MG capsule as needed.    [provider]  losartan (COZAAR) 25 MG tablet Take 25 mg by mouth daily. 11/01/22   [provider]  Multiple Vitamin (MULTIVITAMIN) capsule 1 tablet Orally once a day    [provider]  Multiple Vitamins-Minerals (MULTIVITAMIN WITH MINERALS) tablet Take 1 tablet by mouth  daily.    [provider]  nystatin  (MYCOSTATIN /NYSTOP ) powder Apply 1 application topically daily as needed (yeast infection). 07/28/21   Amundson C Silva, Brook E, MD  nystatin -triamcinolone  (MYCOLOG II) cream Apply 1 application topically 2 (two) times daily. Apply to affected area BID for up to 7 days. 04/22/20   Cathlyn JAYSON Nikki Bobie FORBES, MD  ondansetron  (ZOFRAN ) 4 MG tablet Take 1 tablet (4 mg total) by mouth every 8 (eight) hours as needed for nausea or vomiting. 11/21/23   Gayland Lauraine PARAS, NP  predniSONE  (DELTASONE ) 5 MG tablet Start taking 6 tablets taper by 1 tablet daily until off 05/23/24   Gayland Lauraine PARAS, NP  promethazine  (PHENERGAN ) 25 MG  suppository Place 25 mg rectally every 6 (six) hours as needed for nausea or vomiting.  01/10/20   [provider]  ranitidine (ZANTAC) 150 MG capsule 1 capsule Orally Once a day before bedtime 02/25/15   [provider]  thiamine  (VITAMIN B-1) 50 MG tablet Take 50 mg by mouth daily.  08/07/15   [provider]  tiZANidine  (ZANAFLEX ) 2 MG tablet Take 1 tablet (2 mg total) by mouth 3 times/day as needed-between meals & bedtime for muscle spasms. 05/23/23   Ines Onetha NOVAK, MD  TRINTELLIX 20 MG TABS tablet Take 20 mg by mouth daily. 06/30/22   [provider]  Ubrogepant  (UBRELVY ) 100 MG TABS Take 1 tablet (100 mg total) by mouth as needed (take 1 tablet at onset of headache, can repeat in 2 hours if needed, max is 200 mg in 24 hours). 11/21/23   Gayland Lauraine PARAS, NP    Allergies: Naproxen, Amoxicillin -pot clavulanate, Monistat [miconazole], Sulfa antibiotics, and Tioconazole    Review of Systems  Constitutional:  Negative for fatigue and fever.  Musculoskeletal:  Negative for back pain, neck pain and stiffness.  Skin:  Negative for itching.    Updated Vital Signs BP (!) 105/55 (BP Location: Right Arm)   Pulse (!) 101   Temp 98.4 F (36.9 C) (Oral)   Resp 18   LMP 03/08/2019 (Exact Date)   SpO2 100%   Physical Exam Constitutional:      General: She is not in acute distress.    Appearance: She is not ill-appearing.  HENT:     Head: Normocephalic and atraumatic.     Nose: Nose normal.     Mouth/Throat:     Mouth: Mucous membranes are moist.  Cardiovascular:     Pulses: Normal pulses.     Heart sounds: Normal heart sounds.  Pulmonary:     Effort: Pulmonary effort is normal.  Musculoskeletal:        General: Tenderness (TTP to right ankle but no deformity or major swelling) present. Normal range of motion.  Neurological:     General: No focal deficit present.     Mental Status: She is alert.     Sensory: No sensory deficit.     Motor: No weakness.      (all labs ordered are listed, but only abnormal results are displayed) Labs Reviewed - No data to display  EKG: None  Radiology: DG Ankle Complete Right Result Date: 06/27/2024 EXAM: 3 or more VIEW(S) XRAY OF THE RIGHT ANKLE 06/27/2024 10:41:00 AM CLINICAL HISTORY: Pain. Triage note: Pt caox4 from [facility], c/o R ankle pain and swelling since tripping this morning at approx 0630 this morning. Mechanical fall, denies hitting head and denies LOC. COMPARISON: None available. FINDINGS: BONES AND JOINTS: No acute fracture. No focal osseous  lesion. No joint dislocation. Tiny calcaneal spur. SOFT TISSUES: Mild lateral malleolar soft tissue swelling. IMPRESSION: 1. No acute osseous abnormality. Electronically signed by: Rockey Kilts MD 06/27/2024 11:19 AM EDT RP Workstation: [id number]     Procedures   Medications Ordered in the ED  oxyCODONE  (Oxy IR/ROXICODONE ) immediate release tablet 5 mg (5 mg Oral Given 06/27/24 1021)                                    Medical Decision Making Amount and/or Complexity of Data Reviewed Radiology: ordered.  Risk Prescription drug management.   PAULYNE MOOTY is here for reevaluation of right ankle pain.  She saw orthopedics prior to coming here and had an x-ray that supposedly was unremarkable.  I cannot see that film and we will repeat the x-ray to make sure there is not any fracture or malalignment.  Ultimately however patient has tenderness of the right ankle but there is no obvious swelling or deformity.  She is nervous about her ambulation at home with a walking boot and walker given that she has some chronic left ankle issues.  I will order a wheelchair for her and I talked to case management/social work about this.  I will give her Roxicodone  for breakthrough pain.  Repeat x-ray here per my review interpretation shows no acute fracture or malalignment.  Overall I suspect a sprain.  Neurologically she is intact.  Will put her in a walking  boot.  Will have a wheelchair sent to the house.  She has a walker as well.  I have prescribed narcotic pain medicine for breakthrough pain.  She understands how to use this.  Discharged in good condition.  Follow-up with orthopedics.  Overall I do suspect an ankle sprain.  This chart was dictated using voice recognition software.  Despite best efforts to proofread,  errors can occur which can change the documentation meaning.      Final diagnoses:  Sprain of right ankle, unspecified ligament, initial encounter    ED Discharge Orders          Ordered    oxyCODONE  (ROXICODONE ) 5 MG immediate release tablet  Every 6 hours PRN        06/27/24 1022               Jumar Greenstreet, DO 06/27/24 1124

## 2024-06-27 NOTE — Discharge Instructions (Signed)
 Follow-up with your orthopedic doctor.  I recommend ice, 1000 mg of Tylenol  every 6 hours as needed for pain.  Use wheelchair or walker walking boot.  I have written you for a narcotic pain medicine called Roxicodone  for breakthrough pain.  This medication is sedating so please be careful with its use.  Do not mix alcohol drugs or dangerous activities including driving.

## 2024-06-27 NOTE — ED Triage Notes (Signed)
 Pt caox4 from [facility], c/o R ankle pain and swelling since tripping this morning at approx 0630 this morning. Mechanical fall, denies hitting head and denies LOC.

## 2024-07-01 DIAGNOSIS — S93401A Sprain of unspecified ligament of right ankle, initial encounter: Secondary | ICD-10-CM | POA: Insufficient documentation

## 2024-07-13 DIAGNOSIS — M79672 Pain in left foot: Secondary | ICD-10-CM | POA: Insufficient documentation

## 2024-07-15 ENCOUNTER — Other Ambulatory Visit: Payer: Self-pay

## 2024-07-15 DIAGNOSIS — R55 Syncope and collapse: Secondary | ICD-10-CM

## 2024-07-15 DIAGNOSIS — E782 Mixed hyperlipidemia: Secondary | ICD-10-CM

## 2024-07-15 MED ORDER — ATORVASTATIN CALCIUM 80 MG PO TABS: 80.0000 mg | ORAL_TABLET | Freq: Every day | ORAL | 0 refills | Status: DC

## 2024-07-31 ENCOUNTER — Telehealth: Payer: Self-pay | Admitting: Neurology

## 2024-07-31 MED ORDER — BOTOX 200 UNITS IJ SOLR
200.0000 [IU] | INTRAMUSCULAR | 3 refills | Status: AC
Start: 2024-07-31 — End: ?

## 2024-07-31 NOTE — Telephone Encounter (Signed)
 Submitted auth request to change pt to SP, auth was approved. Please send rx to [facility], thank you!  Auth#: [id number] (07/31/24-07/31/25)

## 2024-07-31 NOTE — Telephone Encounter (Signed)
 Filled to [facility] as requested by maurine botox  coordinator

## 2024-07-31 NOTE — Addendum Note (Signed)
 Addended by: ONEITA NEVELYN BRAVO on: 07/31/2024 03:42 PM   Modules accepted: Orders

## 2024-07-31 NOTE — Telephone Encounter (Signed)
 LVM and sent mychart msg informing pt of appt change - MD schedule change

## 2024-07-31 NOTE — Telephone Encounter (Signed)
 Pt called back , Informed of Appointment Change . Pt agree to change  Appt Confirm

## 2024-08-01 ENCOUNTER — Telehealth: Payer: Self-pay | Admitting: Neurology

## 2024-08-01 NOTE — Telephone Encounter (Signed)
 [facility] called to verify ICD-10 Code for Botox .  Transferred call to Botox  Coordinator, Jillian.

## 2024-08-16 DIAGNOSIS — M5412 Radiculopathy, cervical region: Secondary | ICD-10-CM | POA: Insufficient documentation

## 2024-08-20 ENCOUNTER — Other Ambulatory Visit: Payer: Self-pay | Admitting: Physician Assistant

## 2024-08-20 DIAGNOSIS — E782 Mixed hyperlipidemia: Secondary | ICD-10-CM

## 2024-08-20 DIAGNOSIS — R55 Syncope and collapse: Secondary | ICD-10-CM

## 2024-08-21 ENCOUNTER — Ambulatory Visit: Admitting: Neurology

## 2024-08-21 ENCOUNTER — Telehealth: Payer: Self-pay | Admitting: Neurology

## 2024-08-21 NOTE — Telephone Encounter (Signed)
 [facility] [phone] has verified [facility] address, delivery scheduled for 08-22-24 200 units powder, SDV 1 box 90- days

## 2024-08-21 NOTE — Telephone Encounter (Signed)
Updated appt note

## 2024-08-27 ENCOUNTER — Encounter: Payer: Self-pay | Admitting: Neurology

## 2024-08-27 ENCOUNTER — Ambulatory Visit: Admitting: Neurology

## 2024-08-27 VITALS — BP 118/90 | HR 103

## 2024-08-27 DIAGNOSIS — I10 Essential (primary) hypertension: Secondary | ICD-10-CM | POA: Insufficient documentation

## 2024-08-27 DIAGNOSIS — G43709 Chronic migraine without aura, not intractable, without status migrainosus: Secondary | ICD-10-CM | POA: Diagnosis not present

## 2024-08-27 MED ORDER — ONABOTULINUMTOXINA 200 UNITS IJ SOLR
155.0000 [IU] | Freq: Once | INTRAMUSCULAR | Status: AC
  Administered 2024-08-27: 155 [IU] via INTRAMUSCULAR

## 2024-08-27 NOTE — Progress Notes (Signed)
 Botox - 200 units x 1 vial Lot: [id number] Expiration: 05/2026 NDC: 9976-6078-97  Bacteriostatic 0.9% Sodium Chloride - 4 mL  Lot: [id number] Expiration: 10/04/2025 NDC: 9590-8033-97  Dx: H56.290 S/P  Witnessed by Delon Roys, RMA

## 2024-08-27 NOTE — Progress Notes (Signed)
   BOTOX  PROCEDURE NOTE FOR MIGRAINE HEADACHE  HISTORY: Megan Davila is here for Botox . Last was 05/23/24 with me.  Doing very well with Vyepti + Botox .  On average about 3 migraines a week. Has a migraine today.    Description of procedure:  The patient was placed in a sitting position. The standard protocol was used for Botox  as follows, with 5 units of Botox  injected at each site:   -Procerus muscle, midline injection  -Corrugator muscle, bilateral injection  -Frontalis muscle, bilateral injection, with 2 sites each side, medial injection was performed in the upper one third of the frontalis muscle, in the region vertical from the medial inferior edge of the superior orbital rim. The lateral injection was again in the upper one third of the forehead vertically above the lateral limbus of the cornea, 1.5 cm lateral to the medial injection site.  -Temporalis muscle injection, 4 sites, bilaterally. The first injection was 3 cm above the tragus of the ear, second injection site was 1.5 cm to 3 cm up from the first injection site in line with the tragus of the ear. The third injection site was 1.5-3 cm forward between the first 2 injection sites. The fourth injection site was 1.5 cm posterior to the second injection site.  -Occipitalis muscle injection, 3 sites, bilaterally. The first injection was done one half way between the occipital protuberance and the tip of the mastoid process behind the ear. The second injection site was done lateral and superior to the first, 1 fingerbreadth from the first injection. The third injection site was 1 fingerbreadth superiorly and medially from the first injection site.  -Cervical paraspinal muscle injection, 2 sites, bilateral, the first injection site was 1 cm from the midline of the cervical spine, 3 cm inferior to the lower border of the occipital protuberance. The second injection site was 1.5 cm superiorly and laterally to the first injection  site.  -Trapezius muscle injection was performed at 3 sites, bilaterally. The first injection site was in the upper trapezius muscle halfway between the inflection point of the neck, and the acromion. The second injection site was one half way between the acromion and the first injection site. The third injection was done between the first injection site and the inflection point of the neck.   A 200 unit bottle of Botox  was used, 155 units were injected, the rest of the Botox  was wasted. The patient tolerated the procedure well, there were no complications of the above procedure.  Botox  NDC [id number] Lot number [id number] Expiration date 05/2026 SP

## 2024-09-02 ENCOUNTER — Telehealth: Admitting: Neurology

## 2024-09-10 ENCOUNTER — Encounter: Payer: Self-pay | Admitting: Diagnostic Neuroimaging

## 2024-09-10 ENCOUNTER — Ambulatory Visit: Admitting: Diagnostic Neuroimaging

## 2024-09-10 VITALS — BP 142/91 | HR 75 | Ht 63.0 in | Wt 207.2 lb

## 2024-09-10 DIAGNOSIS — G43709 Chronic migraine without aura, not intractable, without status migrainosus: Secondary | ICD-10-CM

## 2024-09-10 MED ORDER — UBRELVY 100 MG PO TABS: 100.0000 mg | ORAL_TABLET | ORAL | 11 refills | Status: DC | PRN

## 2024-09-10 MED ORDER — ELETRIPTAN HYDROBROMIDE 40 MG PO TABS: 40.0000 mg | ORAL_TABLET | ORAL | 12 refills | Status: AC | PRN

## 2024-09-10 NOTE — Progress Notes (Signed)
 [facility]  PATIENT: Megan Davila DOB: 01/23/1966  REFERRING CLINICIAN: Claudene Pellet, MD  HISTORY FROM: patient  REASON FOR VISIT: follow up   HISTORICAL  CHIEF COMPLAINT:  Chief Complaint  Patient presents with   RM6/MIGRAINES    Pt is here Alone. Pt states that her migraines have increased a lot since her fall a couple of weeks ago.     HISTORY OF PRESENT ILLNESS:   UPDATE (09/10/24, VRP): 58 year old female here for evaluation of migraine follow-up.  Has been followed by Megan Born, NP and Dr. Ines for last few years.  Last Botox  injection on 08/27/2024.  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  Headaches have increased since that time slightly.  Before this recent fall she had been doing better with her headache management.  UPDATE (04/23/18, VRP): Since last visit, doing well. Tolerating meds. No alleviating or aggravating factors. Migraine --> < than 5 days per month and less severe.  UPDATE (10/18/17, VRP): Since last visit, doing well. Tolerating meds. No alleviating or aggravating factors. Had bad migraine in July and Aug 2018, now resolved. Avg 5 days per month of migraine. Also with some tremor since summer 2018.   UPDATE (06/19/17, MM): Ms. Neumeier is a 58 year old female with a history of migraine headaches. She returns today for follow-up. She states that since her last visit with Dr. Margaret she has essentially had a headache daily. She has come in for 2 infusions with temporary benefit. She reports that her headache is located across the forehead and behind the eyes. She does have photophobia and phonophobia. She also reports nausea and vomiting. She is currently taking Zonegran  and Depakote . She had gastric bypass surgery in 2002. She reports that she has been out of work due to the ongoing migraine. She denies  any new neurological symptoms. She returns today for an evaluation.  UPDATE 04/25/17: Since last visit, HA continue (2-3 attacks per month; each lasting 2-3 days). Has gained some weight gradually in last 2 years (100 --> 167 lbs).   UPDATE 07/13/16: Since last visit, only 1-2 major HA in last year needing ER evaluation. May have had total 6-8 migraine attacks all year. Overall doing well. Tolerating TPX, VPA and zolmitriptan . Still with low iron levels (managed by Dr Timmy).   UPDATE 05/20/15: Since last visit, only 6 HA in last 1 year. Had stricture dilation at [facility], complicated by perforation and repair in May 2016. Fortunately, overall doing well now.  UPDATE 05/21/14: Having migraine HAs 3 months out of per year. When she has migraine, it usually lasts up to 1 week at a time, and then she runs out of triptan. Using maxalt and zomig  (alternating). Still on TPX and VPA for prevention. Still with stress related to work.  UPDATE 11/13/12 (JM):  She has been doing well without  any severe headaches until today she has a headache 6/10 with sharp pains behind her eyes. She has not taken any pain medications this morning.  She has had a 7 lb weight loss since last visit now 117lbs, has a history of poor absorption with oral medications.     UPDATE 09/20/12 (JM):  Returns to office since 2011, headaches in the last couple of months, has been persistent with headaches.  She had gastric bypass 2002, pregnant 6 years ago. Was 180 and is now  125lbs.  Has increased stress at work.  Tolerating Topamax  well but feels that when she takes she has a loss of appetite.  Helpful with headache.  Increase sensitivity to hearing, photosensitivity, tinnitus and nausea.  She feels the air filters at work are causing headaches.  She is unable to state how often has severe headaches.  Has pain behind both eyes.  Unable to take NSAID's secondary to anaphylaxis.  Seen PCP October 10 and prescribed Relapax but has not tried.     PRIOR HPI (04/01/10, VRP): 58 year old right-handed female with history of gastric bypass surgery presenting for evaluation of new onset severe left-sided headache on March 23, 2010. On March 23, 2010 patient developed sudden onset left-sided headache behind her left eye. This was associated with seeing spots, feeling nausea and photophobia/phonophobia.  Headache lasted for several days. On April 21 she was seen by her primary care physician who prescribed Maxalt (provided mild relief) and hydrocodone  (no relief). On April 24 she was evaluated at [facility] emergency room. Since that time her headaches have resolved. She does report persistent mild neck pain and difficulty sleeping. She does report remote history of similar headaches 10 years ago. There is no family history of migraine headache. She denies numbness or weakness in arms or legs.   REVIEW OF SYSTEMS: Full 14 system review of systems performed and negative except: neck pain waking weakness.    ALLERGIES: Allergies  Allergen Reactions   Naproxen Anaphylaxis    Other reaction(s): Other Goes unconcious   Amoxicillin -Pot Clavulanate Other (See Comments) and Rash    Mouth sores Mouth sores Mouth sores Mouth sores Mouth sores Mouth sores Mouth sores    Monistat [Miconazole] Other (See Comments)    burning    Sulfa Antibiotics Hives   Tioconazole Itching    HOME MEDICATIONS: Outpatient Medications Prior to Visit  Medication Sig Dispense Refill   acetaminophen  (TYLENOL ) 325 MG tablet 1 tablet as needed     ALPRAZolam  (XANAX ) 1 MG tablet Take 1 mg by mouth 2 (two) times daily as needed. Anxiety     APLENZIN  348 MG TB24 Take 1 tablet by mouth every morning.     Armodafinil  250 MG tablet Take 250 mg by mouth every morning.  2   aspirin EC 81 MG tablet Take 81 mg by mouth daily. Swallow whole.     atorvastatin  (LIPITOR) 80 MG tablet Take 1 tablet (80 mg total) by mouth daily. PATIENT MUST CALL AND SCHEDULE ANNUAL APPOINTMENT  FOR FURTHER REFILLS SECOND ATTEMPT 15 tablet 0   botulinum toxin Type A  (BOTOX ) 200 units injection Inject 200 Units into the muscle every 3 (three) months. 1 each 3   Calcium -Magnesium-Vitamin D  500-250-125 MG-MG-UNIT TABS 1 tablet with food Orally Three times a day     diphenhydrAMINE  (BENADRYL ) 12.5 MG/5ML elixir Take 12.5 mg by mouth every 6 (six) hours as needed for allergies.      famotidine (PEPCID) 20 MG tablet as needed.     fexofenadine  (ALLEGRA) 180 MG tablet Take 180 mg by mouth daily as needed. allergies     fluticasone  (FLONASE ) 50 MCG/ACT nasal spray Place 1 spray into both nostrils as needed for allergies.   1   lansoprazole (PREVACID) 15 MG capsule as needed.     losartan (COZAAR) 25 MG tablet Take 25 mg by mouth daily.     Multiple Vitamin (MULTIVITAMIN) capsule 1 tablet Orally once a day     Multiple Vitamins-Minerals (MULTIVITAMIN WITH MINERALS) tablet Take 1  tablet by mouth daily.     nystatin  (MYCOSTATIN /NYSTOP ) powder Apply 1 application topically daily as needed (yeast infection). 30 g 2   nystatin -triamcinolone  (MYCOLOG II) cream Apply 1 application topically 2 (two) times daily. Apply to affected area BID for up to 7 days. 60 g 0   ondansetron  (ZOFRAN ) 4 MG tablet Take 1 tablet (4 mg total) by mouth every 8 (eight) hours as needed for nausea or vomiting. 20 tablet 5   promethazine  (PHENERGAN ) 25 MG suppository Place 25 mg rectally every 6 (six) hours as needed for nausea or vomiting.      ranitidine (ZANTAC) 150 MG capsule 1 capsule Orally Once a day before bedtime     TRINTELLIX 20 MG TABS tablet Take 20 mg by mouth daily.     eletriptan  (RELPAX ) 40 MG tablet Take 1 tablet (40 mg total) by mouth as needed for migraine or headache. May repeat in 2 hours if needed. Max 2 tabs per day or 8 tabs per month. 10 tablet 12   Ubrogepant  (UBRELVY ) 100 MG TABS Take 1 tablet (100 mg total) by mouth as needed (take 1 tablet at onset of headache, can repeat in 2 hours if needed, max is  200 mg in 24 hours). 16 tablet 11   gabapentin  (NEURONTIN ) 300 MG capsule Take 1 capsule (300 mg total) by mouth 3 (three) times daily. (Patient not taking: Reported on 09/10/2024) 90 capsule 3   oxyCODONE  (ROXICODONE ) 5 MG immediate release tablet Take 1 tablet (5 mg total) by mouth every 6 (six) hours as needed for up to 10 doses. (Patient not taking: Reported on 09/10/2024) 10 tablet 0   predniSONE  (DELTASONE ) 5 MG tablet Start taking 6 tablets taper by 1 tablet daily until off (Patient not taking: Reported on 09/10/2024) 21 tablet 0   thiamine  (VITAMIN B-1) 50 MG tablet Take 50 mg by mouth daily.  (Patient not taking: Reported on 09/10/2024)  0   tiZANidine  (ZANAFLEX ) 2 MG tablet Take 1 tablet (2 mg total) by mouth 3 times/day as needed-between meals & bedtime for muscle spasms. (Patient not taking: Reported on 09/10/2024) 20 tablet 6   sodium chloride  flush (NS) 0.9 % injection 10 mL      No facility-administered medications prior to visit.    PAST MEDICAL HISTORY: Past Medical History:  Diagnosis Date   Abnormal Pap smear    Anemia    Anxiety    Arthritis    neck   Broken toe 12-22-15   middle toe right foot   Cataract of both eyes    Depression    GERD (gastroesophageal reflux disease)    Headache(784.0)    Heart murmur    History of hiatal hernia 2021   small   History of kidney stones    Hypothyroidism    Intestinal malabsorption following gastrectomy 06/19/2015   Migraine    Perforated bowel (HCC)    Pernicious anemia 06/19/2015   PONV (postoperative nausea and vomiting)    Thyroid  disease    hypothyrodism    PAST SURGICAL HISTORY: Past Surgical History:  Procedure Laterality Date   CATARACT EXTRACTION, BILATERAL Bilateral 02/2016   CERVICAL BIOPSY  W/ LOOP ELECTRODE EXCISION  11/2013, 05/2018, 11/2019   LGSIL, negative for dysplasia, LGSIL and positive ECC with potential LGSIL  - respectively   CERVIX LESION DESTRUCTION  1993   CIN III, [facility], recurrence--YAG laser    CESAREAN SECTION  2008   CHOLECYSTECTOMY     COLONOSCOPY  10/2020  CYSTOSCOPY N/A 10/26/2020   Procedure: CYSTOSCOPY;  Surgeon: Cathlyn JAYSON Nikki Bobie FORBES, MD;  Location: [facility];  Service: Gynecology;  Laterality: N/A;   GASTRIC BYPASS  2002   HYSTEROSCOPY WITH D & C  10/14/2011   Procedure: DILATATION AND CURETTAGE (D&C) /HYSTEROSCOPY;  Surgeon: Bobie DELENA Nikki;  Location: [facility];  Service: Gynecology;  Laterality: Bilateral;   LASIK     NOSE SURGERY     STOMACH SURGERY  04/20/2015   dilation of connection between stomach and intestine, [facility]   TONSILLECTOMY     TONSILLECTOMY     TOTAL LAPAROSCOPIC HYSTERECTOMY WITH BILATERAL SALPINGO OOPHORECTOMY N/A 10/26/2020   Procedure: TOTAL LAPAROSCOPIC HYSTERECTOMY WITH BILATERAL SALPINGECTOMY;  Surgeon: Cathlyn JAYSON Nikki Bobie FORBES, MD;  Location[facility];  Service: Gynecology;  Laterality: N/A;   TUBAL LIGATION  2008    FAMILY HISTORY: Family History  Problem Relation Age of Onset   Diabetes Mother    Heart disease Father    Social phobia Father    Psychiatric Illness Father    Hypertension Father    Stroke Father    Dementia Father    Heart disease Other    Diabetes Other    Stroke Other    Hypertension Maternal Grandmother    Thyroid  disease Maternal Grandmother    Hypertension Paternal Grandmother    Stroke Paternal Grandfather    Breast cancer Neg Hx     SOCIAL HISTORY:  Social History   Socioeconomic History   Marital status: Divorced    Spouse name: Not on file   Number of children: 1   Years of education: College   Highest education level: Not on file  Occupational History    Employer: [employer]  Tobacco Use   Smoking status: Never   Smokeless tobacco: Never  Vaping Use   Vaping status: Never Used  Substance and Sexual Activity   Alcohol use: Not Currently    Comment: Rarely   Drug use: No   Sexual activity: Yes    Partners: Male    Birth control/protection:  Surgical    Comment: tubal/Hyst  Other Topics Concern   Not on file  Social History Narrative   Pt lives at home with her family.   Caffeine  Use: once daily   Social Drivers of Health   Financial Resource Strain: Medium Risk (04/05/2023)   Received from [facility]   Overall Financial Resource Strain (CARDIA)    Difficulty of Paying Living Expenses: Somewhat hard  Food Insecurity: Food Insecurity Present (04/05/2023)   [facility]   Hunger Vital Sign    Within the past 12 months, you worried that your food would run out before you got the money to buy more.: Sometimes true    Within the past 12 months, the food you bought just didn't last and you didn't have money to get more.: Sometimes true  Transportation Needs: No Transportation Needs (04/05/2023)   Received from [facility] - Transportation    Lack of Transportation (Medical): No    Lack of Transportation (Non-Medical): No  Physical Activity: Not on file  Stress: Not on file  Social Connections: Unknown (04/15/2022)   Received from [facility]   Social Network    Social Network: Not on file  Intimate Partner Violence: Unknown (03/07/2022)   Received from [facility]   HITS    Physically Hurt: Not on file    Insult or Talk Down To:  Not on file    Threaten Physical Harm: Not on file    Scream or Curse: Not on file     PHYSICAL EXAM  Vitals:   09/10/24 1136  Pulse: 75  SpO2: 99%  Weight: 207 lb 3.2 oz (94 kg)  Height: 5' 3 (1.6 m)   Not recorded     Body mass index is 36.7 kg/m.  GENERAL EXAM: Patient is in no distress; well developed, nourished and groomed; neck is supple  CARDIOVASCULAR: Regular rate and rhythm, no murmurs, no carotid bruits  NEUROLOGIC: MENTAL STATUS: awake, alert, language fluent, comprehension intact, naming intact, fund of knowledge appropriate CRANIAL NERVE:  pupils equal and reactive to light, visual fields full to confrontation, extraocular muscles  intact, no nystagmus, facial sensation and strength symmetric, hearing intact, palate elevates symmetrically, uvula midline, shoulder shrug symmetric, tongue midline. MOTOR: FINE POSTURAL TREMOR (SUBTLE); normal bulk and tone, full strength in the BUE, BLE SENSORY: normal and symmetric to light touch COORDINATION: finger-nose-finger, fine finger movements normal REFLEXES: deep tendon reflexes present and symmetric GAIT/STATION: narrow based gait; DECR ARM SWING; CAUTIOUS GAIT    DIAGNOSTIC DATA (LABS, IMAGING, TESTING) - I reviewed patient records, labs, notes, testing and imaging myself where available.  Lab Results  Component Value Date   WBC 10.3 05/13/2023   HGB 14.0 05/13/2023   HCT 44.2 05/13/2023   MCV 96.5 05/13/2023   PLT 321 05/13/2023      Component Value Date/Time   NA 143 06/27/2023 0911   NA 140 09/07/2016 1327   K 4.1 06/27/2023 0911   K 3.4 (L) 09/07/2016 1327   CL 103 06/27/2023 0911   CO2 25 06/27/2023 0911   CO2 22 09/07/2016 1327   GLUCOSE 110 (H) 06/27/2023 0911   GLUCOSE 140 (H) 05/13/2023 1618   GLUCOSE 83 09/07/2016 1327   BUN 7 06/27/2023 0911   BUN 13.3 09/07/2016 1327   CREATININE 0.86 06/27/2023 0911   CREATININE 0.92 08/11/2021 1214   CREATININE 0.8 09/07/2016 1327   CALCIUM  9.7 06/27/2023 0911   CALCIUM  8.2 (L) 09/07/2016 1327   PROT 6.9 05/13/2023 1618   PROT 7.2 10/18/2017 1630   PROT 6.3 (L) 09/07/2016 1327   ALBUMIN 3.8 05/13/2023 1618   ALBUMIN 4.6 10/18/2017 1630   ALBUMIN 3.2 (L) 09/07/2016 1327   AST 24 05/13/2023 1618   AST 23 10/14/2020 1506   AST 11 09/07/2016 1327   ALT 22 05/13/2023 1618   ALT 20 10/14/2020 1506   ALT <9 09/07/2016 1327   ALKPHOS 79 05/13/2023 1618   ALKPHOS 54 09/07/2016 1327   BILITOT 0.6 05/13/2023 1618   BILITOT 0.4 10/14/2020 1506   BILITOT 0.41 09/07/2016 1327   GFRNONAA >60 05/13/2023 1618   GFRNONAA >60 10/14/2020 1506   GFRAA >60 06/03/2020 1309   Lab Results  Component Value Date   CHOL  206 (H) 06/27/2023   HDL 51 06/27/2023   LDLCALC 128 (H) 06/27/2023   TRIG 150 (H) 06/27/2023   CHOLHDL 4.0 06/27/2023   Lab Results  Component Value Date   HGBA1C 5.8 10/04/2022   Lab Results  Component Value Date   VITAMINB12 279 10/14/2020   Lab Results  Component Value Date   TSH 1.920 10/07/2020    04/17/10 MRI brain - Essentially normal MRI brain.  There are few non-specific foci of gliosis which can be seen in association with chronic migraine headaches, and are of doubtful clinical significance.  04/17/10 MRA head - normal  01/09/13 CT  head - normal    ASSESSMENT AND PLAN  58 y.o. year old female here with history of gastric bypass surgery and intermittent headaches, consistent with migraine with aura.   MEDS TRIED (I reviewed with patient 09/10/24): ANALGESICS: Tylenol , Excedrin ANTI-MIGRAINE: Zomig , Relpax , Maxalt HEART/BP: Verapamil ANTI-NAUSEANT: Phenergan  NSAIDS: Aleve, Naproxen MUSCLE RELAXANTS: Tizanidine  ANTI-CONVULSANTS: Topamax , Zonegran , Depakote , gabapentin  ANTI-DEPRESSANTS: impramine (dry mouth), effexor , Prozac, wellbutrin  OTHER: Aimovig (slightly helpful), Ajovy, emgality PROCEDURES FOR HEADACHES: botox , vyepti  Dx:  Chronic migraine without aura without status migrainosus, not intractable    PLAN:  MIGRAINE WITH AURA PREVENTION (~ 2-3 migraines per week; previously >20-25 migraine per month before botox  and vyepti) - continue botox  migraine every 3 months - continue vyepti 300mg  every 3 months (offset from botox  by 1-2 months)  MIGRAINE RESCUE - continue ubrelvy  100mg  as needed - continue eletriptan  40mg  as needed  Meds ordered this encounter  Medications   Ubrogepant  (UBRELVY ) 100 MG TABS    Sig: Take 1 tablet (100 mg total) by mouth as needed (take 1 tablet at onset of headache, can repeat in 2 hours if needed, max is 200 mg in 24 hours).    Dispense:  16 tablet    Refill:  11   eletriptan  (RELPAX ) 40 MG tablet    Sig: Take 1  tablet (40 mg total) by mouth as needed for migraine or headache. May repeat in 2 hours if needed. Max 2 tabs per day or 8 tabs per month.    Dispense:  10 tablet    Refill:  12   Return in about 1 year (around 09/10/2025) for with NP Teddi Davila).    EDUARD FABIENE HANLON, MD 09/10/2024, 6:27 PM Certified in Neurology, Neurophysiology and Neuroimaging  [facility] [address], [address] [place], [place] [address] [phone]

## 2024-09-10 NOTE — Patient Instructions (Signed)
  MIGRAINE WITH AURA PREVENTION (~ 2-3 migraines per week; previously >20-25 migraine per month before botox  and vyepti) - continue botox  migraine every 3 months - continue vyepti 300mg  every 3 months (offset from botox  by 1-2 months)  MIGRAINE RESCUE - continue ubrelvy  100mg  as needed - continue eletriptan  40mg  as needed

## 2024-09-11 ENCOUNTER — Other Ambulatory Visit: Payer: Self-pay

## 2024-09-11 ENCOUNTER — Emergency Department (HOSPITAL_COMMUNITY): Payer: Worker's Compensation

## 2024-09-11 ENCOUNTER — Emergency Department (HOSPITAL_COMMUNITY)
Admission: EM | Admit: 2024-09-11 | Discharge: 2024-09-11 | Disposition: A | Discharge status: Home / Self Care | Payer: Worker's Compensation

## 2024-09-11 ENCOUNTER — Encounter (HOSPITAL_COMMUNITY): Payer: Self-pay

## 2024-09-11 ENCOUNTER — Encounter: Payer: Self-pay | Admitting: Hematology & Oncology

## 2024-09-11 DIAGNOSIS — M549 Dorsalgia, unspecified: Secondary | ICD-10-CM | POA: Diagnosis not present

## 2024-09-11 DIAGNOSIS — W19XXXA Unspecified fall, initial encounter: Secondary | ICD-10-CM

## 2024-09-11 DIAGNOSIS — R519 Headache, unspecified: Secondary | ICD-10-CM | POA: Insufficient documentation

## 2024-09-11 DIAGNOSIS — W01198A Fall on same level from slipping, tripping and stumbling with subsequent striking against other object, initial encounter: Secondary | ICD-10-CM | POA: Diagnosis not present

## 2024-09-11 DIAGNOSIS — Z7982 Long term (current) use of aspirin: Secondary | ICD-10-CM | POA: Diagnosis not present

## 2024-09-11 DIAGNOSIS — M545 Low back pain, unspecified: Secondary | ICD-10-CM | POA: Diagnosis present

## 2024-09-11 MED ORDER — METHOCARBAMOL 500 MG PO TABS: 500.0000 mg | ORAL_TABLET | Freq: Two times a day (BID) | ORAL | 0 refills | Status: AC

## 2024-09-11 NOTE — ED Provider Notes (Signed)
 [place] [facility] AT [facility] Provider Note   CSN: [id number] Arrival date & time: 09/11/24  0443     Patient presents with: Megan Davila   Megan Davila is a 58 y.o. female.   58 year old female presents with complaint of headaches, back pain, right shoulder pain with right arm weakness. Patient was at work last month, tripped over a door stopper and fell back wards, hitting her head. No LOC. Went to UC either that same day or the next day, had xrays and was told there was nothing wrong. Patient has since followed up with her orthopedist and neurologist (sees for migraines/botox ), as well as UC, advised she needed a scan and directed to the ER. Patient is not currently followed by workers comp provider. 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        Prior to Admission medications   Medication Sig Start Date End Date Taking? Authorizing Provider  methocarbamol (ROBAXIN) 500 MG tablet Take 1 tablet (500 mg total) by mouth 2 (two) times daily. 09/11/24  Yes Beverley Leita LABOR, PA-C  acetaminophen  (TYLENOL ) 325 MG tablet 1 tablet as needed    [provider]  ALPRAZolam  (XANAX ) 1 MG tablet Take 1 mg by mouth 2 (two) times daily as needed. Anxiety    [provider]  APLENZIN  348 MG TB24 Take 1 tablet by mouth every morning. 06/30/22   [provider]  Armodafinil  250 MG tablet Take 250 mg by mouth every morning. 05/24/18   [provider]  aspirin EC 81 MG tablet Take 81 mg by mouth daily. Swallow whole.    [provider]  atorvastatin  (LIPITOR) 80 MG tablet Take 1 tablet (80 mg total) by mouth daily. PATIENT MUST CALL AND SCHEDULE ANNUAL APPOINTMENT FOR FURTHER REFILLS SECOND ATTEMPT 08/20/24   Lelon Hamilton T, PA-C  botulinum toxin Type A  (BOTOX ) 200 units injection Inject 200 Units into the muscle every 3 (three) months. 07/31/24   Gayland Lauraine PARAS, NP  Calcium -Magnesium-Vitamin D   [phone] MG-MG-UNIT TABS 1 tablet with food Orally Three times a day 08/06/15   [provider]  diphenhydrAMINE  (BENADRYL ) 12.5 MG/5ML elixir Take 12.5 mg by mouth every 6 (six) hours as needed for allergies.     [provider]  eletriptan  (RELPAX ) 40 MG tablet Take 1 tablet (40 mg total) by mouth as needed for migraine or headache. May repeat in 2 hours if needed. Max 2 tabs per day or 8 tabs per month. 09/10/24   Penumalli, Vikram R, MD  famotidine (PEPCID) 20 MG tablet as needed. 06/09/20   [provider]  fexofenadine  (ALLEGRA) 180 MG tablet Take 180 mg by mouth daily as needed. allergies    [provider]  fluticasone  (FLONASE ) 50 MCG/ACT nasal spray Place 1 spray into both nostrils as needed for allergies.  01/17/17   [provider]  lansoprazole (PREVACID) 15 MG capsule as needed.    [provider]  losartan (COZAAR) 25 MG tablet Take 25 mg by mouth daily. 11/01/22   [provider]  Multiple Vitamin (MULTIVITAMIN) capsule 1 tablet Orally once a day    [provider]  Multiple Vitamins-Minerals (MULTIVITAMIN WITH MINERALS) tablet Take 1 tablet by mouth daily.    [provider]  nystatin  (MYCOSTATIN /NYSTOP ) powder Apply 1 application topically daily as needed (yeast infection). 07/28/21   Cathlyn JAYSON Nikki Bobie FORBES, MD  nystatin -triamcinolone  (MYCOLOG II) cream  Apply 1 application topically 2 (two) times daily. Apply to affected area BID for up to 7 days. 04/22/20   Cathlyn JAYSON Nikki Bobie FORBES, MD  ondansetron  (ZOFRAN ) 4 MG tablet Take 1 tablet (4 mg total) by mouth every 8 (eight) hours as needed for nausea or vomiting. 11/21/23   Gayland Lauraine PARAS, NP  promethazine  (PHENERGAN ) 25 MG suppository Place 25 mg rectally every 6 (six) hours as needed for nausea or vomiting.  01/10/20   [provider]  ranitidine (ZANTAC) 150 MG capsule 1 capsule Orally Once a day before bedtime 02/25/15   [provider]   TRINTELLIX 20 MG TABS tablet Take 20 mg by mouth daily. 06/30/22   [provider]  Ubrogepant  (UBRELVY ) 100 MG TABS Take 1 tablet (100 mg total) by mouth as needed (take 1 tablet at onset of headache, can repeat in 2 hours if needed, max is 200 mg in 24 hours). 09/10/24   Penumalli, Eduard SAUNDERS, MD    Allergies: Naproxen, Amoxicillin -pot clavulanate, Monistat [miconazole], Sulfa antibiotics, and Tioconazole    Review of Systems Negative except as per HPI Updated Vital Signs BP (!) 139/93 (BP Location: Right Arm)   Pulse 87   Temp 97.7 F (36.5 C)   Resp 18   LMP 03/08/2019 (Exact Date)   SpO2 99%   Physical Exam Vitals and nursing note reviewed.  Constitutional:      General: She is not in acute distress.    Appearance: She is well-developed. She is not diaphoretic.  HENT:     Head: Normocephalic and atraumatic.  Pulmonary:     Effort: Pulmonary effort is normal.  Musculoskeletal:     Cervical back: No tenderness or bony tenderness.     Thoracic back: Tenderness present. No bony tenderness.     Lumbar back: No tenderness or bony tenderness.       Back:  Skin:    General: Skin is warm and dry.     Findings: No erythema or rash.  Neurological:     Mental Status: She is alert and oriented to person, place, and time.  Psychiatric:        Behavior: Behavior normal.     (all labs ordered are listed, but only abnormal results are displayed) Labs Reviewed - No data to display  EKG: None  Radiology: CT Head Wo Contrast Result Date: 09/11/2024 EXAM: CT HEAD WITHOUT CONTRAST 09/11/2024 05:44:57 AM TECHNIQUE: CT of the head was performed without the administration of intravenous contrast. Automated exposure control, iterative reconstruction, and/or weight based adjustment of the mA/kV was utilized to reduce the radiation dose to as low as reasonably achievable. COMPARISON: Head CT 05/13/2023 and brain MRI 09/23/2022. CLINICAL HISTORY: 58 year old female. Post-traumatic  headache, fall 2 weeks ago hitting back of head with continued headaches. FINDINGS: BRAIN AND VENTRICLES: No acute hemorrhage. No evidence of acute infarct. No hydrocephalus. No extra-axial collection. No mass effect or midline shift. Cerebral volume is stable from last year, within normal limits for age. Calcified atherosclerosis at the skull base. No suspicious intracranial vascular hyperdensity. ORBITS: No acute abnormality. No discrete orbital soft tissue injury identified. SINUSES: No acute abnormality. SOFT TISSUES AND SKULL: No acute soft tissue abnormality. No discrete scalp soft tissue injury identified. No skull fracture. IMPRESSION: 1. No recent traumatic injury identified. Stable and negative for age noncontrast head CT Electronically signed by: Helayne Hurst MD 09/11/2024 05:51 AM EDT RP Workstation: [id number]     Procedures   Medications Ordered in the ED -  No data to display                                  Medical Decision Making Amount and/or Complexity of Data Reviewed Radiology: ordered.   58 year old female here for ongoing back pain and headaches, right arm weakness following a fall at work last month. CT head as ordered in triage, interpreted as no traumatic injury identified. Agree with radiology interpretation. Medication list reviewed.    I can not see the Xrs obtained in UC nor can I see any UC notes in the record. Record from neurology from yesterday (09/10/24) reviewed, report increased headaches since her fall. On Relpax  and Ubrelvy .  Visit to sports med 09/10/23 with right CAM walker, ASO on left.  Visit to ortho 09/02/24 report of neck and RUE pain without improvement from epidural injection 08/21/24. Fall 1 week prior, now with worsening right side neck and RUE pain, with subjective right grip strength weakness without frank paralysis/weakness or myelopathic features.   Appears to be acute on chronic pain today. Provided with robaxin to take if not driving. Strongly  encouraged to follow up with her worker's comp provider, may benefit from physical therapy.      Final diagnoses:  Fall, initial encounter  Musculoskeletal back pain    ED Discharge Orders          Ordered    methocarbamol (ROBAXIN) 500 MG tablet  2 times daily        09/11/24 0803               Beverley Leita LABOR, PA-C 09/11/24 0817    Kammerer, Megan L, DO 09/12/24 1001

## 2024-09-11 NOTE — ED Triage Notes (Signed)
 Pt is coming in for a fall that occurred a few days ago, she mentions she went to UC after, she mechanism was a tripping and falling backwards and hitting her head. She had negative X-rays. She is coming I for continued heads with upper neck pain.

## 2024-09-11 NOTE — Discharge Instructions (Addendum)
 Recommend follow up with your worker's comp provider for further evaluation. May benefit from physical therapy.  Try Robaxin as prescribed. Do not drive or operate machinery while taking this medication. Warm compress for 20 minutes to back/shoulders, follow with gentle range of motion exercises.

## 2024-09-17 ENCOUNTER — Encounter: Payer: Self-pay | Admitting: Neurology

## 2024-09-20 ENCOUNTER — Telehealth: Admitting: Neurology

## 2024-09-26 ENCOUNTER — Telehealth: Payer: Self-pay | Admitting: Diagnostic Neuroimaging

## 2024-09-26 NOTE — Telephone Encounter (Signed)
 Pt called stating that she was to receive a referral for Pain Management at [facility] but when she called they informed her that they have not received a referral. Pt would like it sent to see Dr. Darlis Fax# [phone]

## 2024-09-30 NOTE — Telephone Encounter (Signed)
 Called and informed pt. Pt would like to discuss with RN when she gets a chance to call her. Please advise.

## 2024-09-30 NOTE — Telephone Encounter (Signed)
 I called pt.    She had fall several weeks back (hit back and back of head) and went to ED.  And then pcp.  ? If seeing Pain management and having PT.  She thought we had ordered or recommended.  I told her that we had not,  our recommendations was to continue medications, botox , vyepti, ubrelvy / triptan as needed.  She was still having some headaches and vyepti usually helps.  If she had concussion sometimes that may take time to go away. She said that was ok.  Appreciated call back.

## 2024-10-03 ENCOUNTER — Encounter (HOSPITAL_COMMUNITY): Payer: Self-pay

## 2024-10-03 ENCOUNTER — Emergency Department (HOSPITAL_COMMUNITY)

## 2024-10-03 ENCOUNTER — Other Ambulatory Visit: Payer: Self-pay

## 2024-10-03 ENCOUNTER — Emergency Department (HOSPITAL_COMMUNITY)
Admission: EM | Admit: 2024-10-03 | Discharge: 2024-10-04 | Disposition: A | Discharge status: Home / Self Care | Attending: Emergency Medicine | Admitting: Emergency Medicine

## 2024-10-03 DIAGNOSIS — R7989 Other specified abnormal findings of blood chemistry: Secondary | ICD-10-CM | POA: Insufficient documentation

## 2024-10-03 DIAGNOSIS — D72829 Elevated white blood cell count, unspecified: Secondary | ICD-10-CM | POA: Insufficient documentation

## 2024-10-03 DIAGNOSIS — N3001 Acute cystitis with hematuria: Secondary | ICD-10-CM | POA: Diagnosis not present

## 2024-10-03 DIAGNOSIS — Z7982 Long term (current) use of aspirin: Secondary | ICD-10-CM | POA: Insufficient documentation

## 2024-10-03 DIAGNOSIS — R200 Anesthesia of skin: Secondary | ICD-10-CM | POA: Diagnosis present

## 2024-10-03 LAB — COMPREHENSIVE METABOLIC PANEL WITH GFR
ALT: 70 U/L — ABNORMAL HIGH (ref 0–44)
AST: 28 U/L (ref 15–41)
Albumin: 3.6 g/dL (ref 3.5–5.0)
Alkaline Phosphatase: 89 U/L (ref 38–126)
Anion gap: 11 (ref 5–15)
BUN: 10 mg/dL (ref 6–20)
CO2: 28 mmol/L (ref 22–32)
Calcium: 9 mg/dL (ref 8.9–10.3)
Chloride: 104 mmol/L (ref 98–111)
Creatinine, Ser: 1.16 mg/dL — ABNORMAL HIGH (ref 0.44–1.00)
GFR, Estimated: 56 mL/min — ABNORMAL LOW (ref >60)
Glucose, Bld: 95 mg/dL (ref 70–99)
Potassium: 3.8 mmol/L (ref 3.5–5.1)
Sodium: 143 mmol/L (ref 135–145)
Total Bilirubin: 0.5 mg/dL (ref 0.0–1.2)
Total Protein: 6.8 g/dL (ref 6.5–8.1)

## 2024-10-03 LAB — URINALYSIS, ROUTINE W REFLEX MICROSCOPIC
Bilirubin Urine: NEGATIVE
Glucose, UA: NEGATIVE mg/dL
Ketones, ur: NEGATIVE mg/dL
Nitrite: POSITIVE — AB
Protein, ur: NEGATIVE mg/dL
Specific Gravity, Urine: 1.017 (ref 1.005–1.030)
pH: 5 (ref 5.0–8.0)

## 2024-10-03 LAB — CBC
HCT: 43.4 % (ref 36.0–46.0)
Hemoglobin: 13.8 g/dL (ref 12.0–15.0)
MCH: 30.7 pg (ref 26.0–34.0)
MCHC: 31.8 g/dL (ref 30.0–36.0)
MCV: 96.7 fL (ref 80.0–100.0)
Platelets: 358 K/uL (ref 150–400)
RBC: 4.49 MIL/uL (ref 3.87–5.11)
RDW: 11.9 % (ref 11.5–15.5)
WBC: 11.2 K/uL — ABNORMAL HIGH (ref 4.0–10.5)
nRBC: 0 % (ref 0.0–0.2)

## 2024-10-03 LAB — CBG MONITORING, ED: Glucose-Capillary: 84 mg/dL (ref 70–99)

## 2024-10-03 MED ORDER — NITROFURANTOIN MONOHYD MACRO 100 MG PO CAPS
100.0000 mg | ORAL_CAPSULE | Freq: Once | ORAL | Status: AC
  Administered 2024-10-03: 100 mg via ORAL
  Filled 2024-10-03: qty 1

## 2024-10-03 MED ORDER — NITROFURANTOIN MONOHYD MACRO 100 MG PO CAPS: 100.0000 mg | ORAL_CAPSULE | Freq: Two times a day (BID) | ORAL | 0 refills | Status: AC

## 2024-10-03 MED ORDER — LORAZEPAM 1 MG PO TABS
0.5000 mg | ORAL_TABLET | Freq: Once | ORAL | Status: AC
  Administered 2024-10-03: 0.5 mg via ORAL
  Filled 2024-10-03: qty 1

## 2024-10-03 MED ORDER — GADOBUTROL 1 MMOL/ML IV SOLN
9.0000 mL | Freq: Once | INTRAVENOUS | Status: AC | PRN
Start: 2024-10-03 — End: 2024-10-03
  Administered 2024-10-03: 9 mL via INTRAVENOUS

## 2024-10-03 NOTE — ED Triage Notes (Addendum)
 QUICK TRIAGE: Pt reports memory loss, difficulty concentrating, heart palpitations and slurred speech for the last 3 days.  Denies new onset of symptoms in last 4 hours. Pt reports migraine headache.

## 2024-10-03 NOTE — ED Triage Notes (Signed)
 PT arrives via POV, she is AxOx4. She reports she has had some forgetfulness and palpitations over the past few days. No slurred speech noted. PT reports some mild sob. No chest pain.

## 2024-10-03 NOTE — Discharge Instructions (Addendum)
 Exam and lab work are reassuring today.  UA was indicative of a UTI, I started you on antibiotics and sent your urine for culture.  You will be advised on the results of the culture.  Please follow-up with primary care and neurology for further evaluation of confusion.  If you experience worsening symptoms or new concerning symptoms please return to the ED for further evaluation.

## 2024-10-03 NOTE — ED Provider Notes (Signed)
 [place] EMERGENCY DEPARTMENT AT [facility] Provider Note   CSN: [id number] Arrival date & time: 10/03/24  1326     Patient presents with: Neurologic Problem   Megan Davila is a 58 y.o. female.  58 year old female presents to the ED with complaints of of forgetfulness, feeling confused, and episodes of tachycardia.  Patient also endorses intermittent bilateral finger tingling/numbness.  Patient advises approximately 4 days she started feeling abnormal with some mild confusion and forgetfulness.  She reports she was going to her doctor that she has been to several times and got lost.  She reports she also had an episode of racing heart rate last night while sleeping.  Patient reports she has had several falls in the last year that is being followed by orthopedics.  She has been placed on steroids with significant weight gain over the last year and reports this has added to stress and difficulty with exercise.  She reports she is recently had a lot of stress from work as well.  Patient advises she had some chest tightness over the last few days as well.  Currently patient reports chest pain and numbness and tingling in her hands has completely gone away.  She also denies any shortness of breath currently.  She does endorse some forgetfulness currently but is able to ambulate in the ED without difficulty.       Prior to Admission medications   Medication Sig Start Date End Date Taking? Authorizing Provider  nitrofurantoin , macrocrystal-monohydrate, (MACROBID ) 100 MG capsule Take 1 capsule (100 mg total) by mouth 2 (two) times daily. 10/03/24  Yes Myriam Chew S, PA-C  acetaminophen  (TYLENOL ) 325 MG tablet 1 tablet as needed    [provider]  ALPRAZolam  (XANAX ) 1 MG tablet Take 1 mg by mouth 2 (two) times daily as needed. Anxiety    [provider]  APLENZIN  348 MG TB24 Take 1 tablet by mouth every morning. 06/30/22   [provider]  Armodafinil  250  MG tablet Take 250 mg by mouth every morning. 05/24/18   [provider]  aspirin EC 81 MG tablet Take 81 mg by mouth daily. Swallow whole.    [provider]  atorvastatin  (LIPITOR) 80 MG tablet Take 1 tablet (80 mg total) by mouth daily. PATIENT MUST CALL AND SCHEDULE ANNUAL APPOINTMENT FOR FURTHER REFILLS SECOND ATTEMPT 08/20/24   Lelon Hamilton T, PA-C  botulinum toxin Type A  (BOTOX ) 200 units injection Inject 200 Units into the muscle every 3 (three) months. 07/31/24   Gayland Lauraine PARAS, NP  Calcium -Magnesium-Vitamin D  [phone] MG-MG-UNIT TABS 1 tablet with food Orally Three times a day 08/06/15   [provider]  diphenhydrAMINE  (BENADRYL ) 12.5 MG/5ML elixir Take 12.5 mg by mouth every 6 (six) hours as needed for allergies.     [provider]  eletriptan  (RELPAX ) 40 MG tablet Take 1 tablet (40 mg total) by mouth as needed for migraine or headache. May repeat in 2 hours if needed. Max 2 tabs per day or 8 tabs per month. 09/10/24   Penumalli, Vikram R, MD  famotidine (PEPCID) 20 MG tablet as needed. 06/09/20   [provider]  fexofenadine  (ALLEGRA) 180 MG tablet Take 180 mg by mouth daily as needed. allergies    [provider]  fluticasone  (FLONASE ) 50 MCG/ACT nasal spray Place 1 spray into both nostrils as needed for allergies.  01/17/17   [provider]  lansoprazole (PREVACID) 15 MG capsule as needed.    [provider]  losartan (COZAAR) 25 MG tablet Take 25 mg by mouth daily. 11/01/22   [provider]  methocarbamol (ROBAXIN) 500 MG tablet Take 1 tablet (500 mg total) by mouth 2 (two) times daily. 09/11/24   Beverley Leita LABOR, PA-C  Multiple Vitamin (MULTIVITAMIN) capsule 1 tablet Orally once a day    [provider]  Multiple Vitamins-Minerals (MULTIVITAMIN WITH MINERALS) tablet Take 1 tablet by mouth daily.    [provider]  nystatin  (MYCOSTATIN /NYSTOP ) powder Apply 1 application topically daily as  needed (yeast infection). 07/28/21   Amundson C Silva, Brook E, MD  nystatin -triamcinolone  (MYCOLOG II) cream Apply 1 application topically 2 (two) times daily. Apply to affected area BID for up to 7 days. 04/22/20   Cathlyn JAYSON Nikki Bobie FORBES, MD  ondansetron  (ZOFRAN ) 4 MG tablet Take 1 tablet (4 mg total) by mouth every 8 (eight) hours as needed for nausea or vomiting. 11/21/23   Gayland Lauraine PARAS, NP  promethazine  (PHENERGAN ) 25 MG suppository Place 25 mg rectally every 6 (six) hours as needed for nausea or vomiting.  01/10/20   [provider]  ranitidine (ZANTAC) 150 MG capsule 1 capsule Orally Once a day before bedtime 02/25/15   [provider]  TRINTELLIX 20 MG TABS tablet Take 20 mg by mouth daily. 06/30/22   [provider]  Ubrogepant  (UBRELVY ) 100 MG TABS Take 1 tablet (100 mg total) by mouth as needed (take 1 tablet at onset of headache, can repeat in 2 hours if needed, max is 200 mg in 24 hours). 09/10/24   Penumalli, Eduard SAUNDERS, MD    Allergies: Naproxen, Amoxicillin -pot clavulanate, Monistat [miconazole], Sulfa antibiotics, and Tioconazole    Review of Systems  Respiratory:  Positive for shortness of breath.   Psychiatric/Behavioral:  Positive for confusion.   All other systems reviewed and are negative.   Updated Vital Signs BP 130/81   Pulse 90   Temp 98.1 F (36.7 C)   Resp (!) 24   LMP 03/08/2019 (Exact Date)   SpO2 100%   Physical Exam Vitals and nursing note reviewed.  Constitutional:      Appearance: Normal appearance.  HENT:     Head: Normocephalic and atraumatic.     Nose: Nose normal.  Eyes:     General: Vision grossly intact.     Extraocular Movements: Extraocular movements intact.     Right eye: Normal extraocular motion.     Left eye: Normal extraocular motion.     Conjunctiva/sclera: Conjunctivae normal.     Pupils: Pupils are equal, round, and reactive to light.  Cardiovascular:     Rate and Rhythm: Normal rate.  Pulmonary:      Effort: Pulmonary effort is normal. No respiratory distress.     Breath sounds: Normal breath sounds.  Abdominal:     Tenderness: There is no abdominal tenderness. There is no guarding.  Musculoskeletal:        General: Normal range of motion.     Cervical back: Normal range of motion.     Comments: Patient has chronic swelling in bilateral extremities.  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  Patient is being seen by orthopedics  Skin:    General: Skin is warm.  Neurological:     General: No focal deficit present.     Mental Status: She is alert.     GCS: GCS eye subscore is 4. GCS verbal subscore is 5. GCS motor subscore  is 6.     Cranial Nerves: Cranial nerves 2-12 are intact. No cranial nerve deficit.     Sensory: Sensation is intact.     Motor: Motor function is intact. No weakness.     Coordination: Coordination is intact. Finger-Nose-Finger Test normal.     Gait: Gait is intact.     Comments: No deficits on neuroexam noted.  Patient is ambulatory without assistance.  Coordination is intact.  Pupils are equal and reactive bilaterally without any pain on EOM.  Visual acuity is intact in ED.  Psychiatric:        Mood and Affect: Mood normal.        Behavior: Behavior normal.     (all labs ordered are listed, but only abnormal results are displayed) Labs Reviewed  COMPREHENSIVE METABOLIC PANEL WITH GFR - Abnormal; Notable for the following components:      Result Value   Creatinine, Ser 1.16 (*)    ALT 70 (*)    GFR, Estimated 56 (*)    All other components within normal limits  CBC - Abnormal; Notable for the following components:   WBC 11.2 (*)    All other components within normal limits  URINALYSIS, ROUTINE W REFLEX MICROSCOPIC - Abnormal; Notable for the following components:   APPearance HAZY (*)    Hgb urine dipstick SMALL (*)    Nitrite POSITIVE (*)    Leukocytes,Ua MODERATE (*)    Bacteria, UA MANY (*)    All other  components within normal limits  URINE CULTURE  CBG MONITORING, ED    EKG: EKG Interpretation Date/Time:  Thursday October 03 2024 18:39:02 EDT Ventricular Rate:  81 PR Interval:  145 QRS Duration:  90 QT Interval:  382 QTC Calculation: 444 R Axis:   -16  Text Interpretation: Sinus rhythm Abnormal R-wave progression, early transition LVH by voltage Anterior Q waves, possibly due to LVH similar to previous Confirmed by Bari Flank [phone]) on 10/03/2024 6:40:51 PM  Radiology: [facility] Chest Portable 1 View Result Date: 10/03/2024 CLINICAL DATA:  Shortness of breath. EXAM: PORTABLE CHEST 1 VIEW COMPARISON:  Chest radiograph dated 09/30/2022. FINDINGS: The heart size and mediastinal contours are within normal limits. Both lungs are clear. The visualized skeletal structures are unremarkable. IMPRESSION: No active disease. Electronically Signed   By: Vanetta Chou M.D.   On: 10/03/2024 19:26   CT Head Wo Contrast Result Date: 10/03/2024 CLINICAL DATA:  Confusion. EXAM: CT HEAD WITHOUT CONTRAST TECHNIQUE: Contiguous axial images were obtained from the base of the skull through the vertex without intravenous contrast. RADIATION DOSE REDUCTION: This exam was performed according to the departmental dose-optimization program which includes automated exposure control, adjustment of the mA and/or kV according to patient size and/or use of iterative reconstruction technique. COMPARISON:  Head CT 07/14/2024 FINDINGS: Brain: No intracranial hemorrhage, mass effect, or midline shift. No hydrocephalus. The basilar cisterns are patent. No evidence of territorial infarct or acute ischemia. No extra-axial or intracranial fluid collection. Vascular: No hyperdense vessel or unexpected calcification. Skull: No fracture or focal lesion. Sinuses/Orbits: Paranasal sinuses and mastoid air cells are clear. The visualized orbits are unremarkable. Other: None. IMPRESSION: Negative noncontrast head CT. Electronically Signed    By: Andrea Gasman M.D.   On: 10/03/2024 17:43     Procedures   Medications Ordered in the ED  gadobutrol (GADAVIST) 1 MMOL/ML injection 9 mL (has no administration in time range)  nitrofurantoin  (macrocrystal-monohydrate) (MACROBID ) capsule 100 mg (100 mg Oral Given 10/03/24 2050)  LORazepam  (  ATIVAN ) tablet 0.5 mg (0.5 mg Oral Given 10/03/24 2055)    58 y.o. female presents to the ED with complaints of confusion with associated episode of difficulty speaking and headaches., this involves an extensive number of treatment options, and is a complaint that carries with it a high risk of complications and morbidity.  The differential diagnosis includes TIA, CVA, UTI, electrolyte abnormality, arrhythmia (Ddx)  On arrival pt is nontoxic, vitals unremarkable. Exam significant for chronic bilateral leg swelling noted there acute findings on exam.  Additional history obtained from chart review significant for patient being seen by neurology for chronic migraines  I ordered medication Macrobid  and Ativan  for UTI and anxiety for MRI  Lab Tests:  I Ordered, reviewed, and interpreted labs, which included: CMP, CBC, UA, CBG, urine culture.  UA suggestive for UTI.  CBC has mildly elevated white count.  Creatinine is mildly elevated and GFR mildly decreased close to the patient's baseline.  Imaging Studies ordered:  I ordered imaging studies which included CT head without contrast, chest x-ray, MRI with and without contrast, I independently visualized and interpreted imaging which showed no acute abnormalities noted on chest x-ray or CT head.  ED Course:   No new deficits on neuroexam patient sitting comfortably in ED bed.  Patient reports concern for potential TIA because she reports she has happened in the past.  Patient has had confusion for 3 days which includes incident such as forgetting directions to her doctor which she has been to before.  She denies that she just does not feel herself.   She reports an episode yesterday where she was on the phone with her aunt and her was that she had some slurred speech.  He also reported some blurred vision a few days of ago that has since resolved.  All symptoms has resolved but he says she still feels like she gets confused when she is trying to discuss work.  Patient is able to articulate during entire ED exam.  Patient has had some increased frequency of urine and UA was suggestive of UTI so patient was started on Macrobid  and urine culture is pending.  With symptoms of confusion with associated headache and slurred speech MRI will be ordered and patient will be advised to follow-up with her established neurologist.   Patient reports she feels comfortable with this plan and she is also wanting to get established with a cardiologist and is requesting a referral.  Patient reports she did have 1 episode of tachycardia yesterday which was concerning to her but patient has not had any other symptoms since.  During this incident patient denies any chest pain or shortness of breath.  Patient care transferred to Dr. Bari pending MRI of brain.   Portions of this note were generated with Scientist, clinical (histocompatibility and immunogenetics). Dictation errors may occur despite best attempts at proofreading.   Final diagnoses:  Acute cystitis with hematuria    ED Discharge Orders          Ordered    nitrofurantoin , macrocrystal-monohydrate, (MACROBID ) 100 MG capsule  2 times daily        10/03/24 2015               Myriam Fonda RAMAN, PA-C 10/03/24 2310    Bari Roxie HERO, DO 10/04/24 0004

## 2024-10-03 NOTE — ED Notes (Signed)
 Pt came back from CT.

## 2024-10-03 NOTE — ED Notes (Signed)
 Micro called to add-on culture

## 2024-10-03 NOTE — ED Notes (Signed)
 Pt stated that she is claustrophobic in regards to the MRI testing. Escalated to MD and medications were given. Pt given something to drink and eat upon request.

## 2024-10-03 NOTE — ED Provider Notes (Signed)
 MRI of the brain shows no acute findings.  Will plan for discharge, outpatient follow-up and treatment of possible UTI.  Patient at this time appears safe and stable for discharge and close outpatient follow up. Discharge plan and strict return to ED precautions discussed, patient verbalizes understanding and agreement.   Bari Roxie HERO, DO 10/03/24 2357

## 2024-10-03 NOTE — ED Notes (Signed)
Went for MRI

## 2024-10-03 NOTE — ED Notes (Signed)
 PA in room informing pt of dx and treatment plan. Pt A&0x4 but has complaints of feeling off and brain fog. Pt has call light in reach, lights deemed for comfort due to HA, wheels locked, bed in lowest position, belonging in reach.

## 2024-10-05 LAB — URINE CULTURE: Culture: >=100000 — AB

## 2024-10-06 ENCOUNTER — Telehealth (HOSPITAL_BASED_OUTPATIENT_CLINIC_OR_DEPARTMENT_OTHER): Payer: Self-pay | Admitting: *Deleted

## 2024-10-06 NOTE — Telephone Encounter (Signed)
 Post ED Visit - Positive Culture Follow-up  Culture report reviewed by antimicrobial stewardship pharmacist: [facility] [address] Pharm.D. []  Venetia Gully, Pharm.D., BCPS AQ-ID []  Garrel Crews, Pharm.D., BCPS []  Almarie Lunger, Pharm.D., BCPS []  [place], [address].D., BCPS, AAHIVP []  Rosaline Bihari, Pharm.D., BCPS, AAHIVP []  Vernell Meier, PharmD, BCPS []  Latanya Hint, PharmD, BCPS []  Donald Medley, PharmD, BCPS []  Rocky Bold, PharmD []  Dorothyann Alert, PharmD, BCPS [x]  Dorn Poot, PharmD  [facility] []  Rosaline Edison, PharmD []  Romona Bliss, PharmD []  Dolphus Roller, PharmD []  Veva Seip, Rph []  Vernell Daunt) Leonce, PharmD []  Eva Allis, PharmD []  Rosaline Millet, PharmD []  Iantha Batch, PharmD []  Arvin Gauss, PharmD []  Wanda Hasting, PharmD []  Ronal Rav, PharmD []  Rocky Slade, PharmD []  Bard Jeans, PharmD   Positive urine culture Treated with Nitrofurantoin , organism sensitive to the same and no further patient follow-up is required at this time.  Megan Davila 10/06/2024, 10:42 AM

## 2024-11-21 ENCOUNTER — Ambulatory Visit: Admitting: Neurology

## 2024-11-21 VITALS — BP 136/84

## 2024-11-21 DIAGNOSIS — G43709 Chronic migraine without aura, not intractable, without status migrainosus: Secondary | ICD-10-CM | POA: Diagnosis not present

## 2024-11-21 MED ORDER — ONABOTULINUMTOXINA 200 UNITS IJ SOLR
155.0000 [IU] | Freq: Once | INTRAMUSCULAR | Status: AC
  Administered 2024-11-21: 13:00:00 155 [IU] via INTRAMUSCULAR

## 2024-11-21 MED ORDER — ONDANSETRON HCL 4 MG PO TABS: 4.0000 mg | ORAL_TABLET | Freq: Three times a day (TID) | ORAL | 5 refills | Status: AC | PRN

## 2024-11-21 NOTE — Progress Notes (Signed)
 Botox - 200 units x 1 vial Lot: [id number] Expiration: 02/2027 NDC: 9976-6078-97  Bacteriostatic 0.9% Sodium Chloride - 4 mL  Lot: [id number] Expiration: [id number] NDC: 9590-8033-97  Dx: G43.709 S/P Witnessed by: Nevelyn Meissner, CMA

## 2024-11-21 NOTE — Progress Notes (Signed)
° °  BOTOX  PROCEDURE NOTE FOR MIGRAINE HEADACHE   HISTORY: Megan Davila is here for Botox . Last was 08/27/24 with me. Saw Dr. Margaret October 2025 plan to continue Vyepti + Botox . Ubrelvy /Relpax  PRN. Mentions coming back here next week to see Dr. SHAUNNA for confusion. Had a fall back in October, since then more headache and neck pain. MRI brain 10/03/24 stable.   Description of procedure:  The patient was placed in a sitting position. The standard protocol was used for Botox  as follows, with 5 units of Botox  injected at each site:   -Procerus muscle, midline injection  -Corrugator muscle, bilateral injection  -Frontalis muscle, bilateral injection, with 2 sites each side, medial injection was performed in the upper one third of the frontalis muscle, in the region vertical from the medial inferior edge of the superior orbital rim. The lateral injection was again in the upper one third of the forehead vertically above the lateral limbus of the cornea, 1.5 cm lateral to the medial injection site.  -Temporalis muscle injection, 4 sites, bilaterally. The first injection was 3 cm above the tragus of the ear, second injection site was 1.5 cm to 3 cm up from the first injection site in line with the tragus of the ear. The third injection site was 1.5-3 cm forward between the first 2 injection sites. The fourth injection site was 1.5 cm posterior to the second injection site.  -Occipitalis muscle injection, 3 sites, bilaterally. The first injection was done one half way between the occipital protuberance and the tip of the mastoid process behind the ear. The second injection site was done lateral and superior to the first, 1 fingerbreadth from the first injection. The third injection site was 1 fingerbreadth superiorly and medially from the first injection site.  -Cervical paraspinal muscle injection, 2 sites, bilateral, the first injection site was 1 cm from the midline of the cervical spine, 3 cm inferior  to the lower border of the occipital protuberance. The second injection site was 1.5 cm superiorly and laterally to the first injection site.  -Trapezius muscle injection was performed at 3 sites, bilaterally. The first injection site was in the upper trapezius muscle halfway between the inflection point of the neck, and the acromion. The second injection site was one half way between the acromion and the first injection site. The third injection was done between the first injection site and the inflection point of the neck.   A 200 unit bottle of Botox  was used, 155 units were injected, the rest of the Botox  was wasted. The patient tolerated the procedure well, there were no complications of the above procedure.  Botox  NDC 9976-6078-97 Lot number [id number] Expiration date 02/2027 SP

## 2024-11-26 ENCOUNTER — Institutional Professional Consult (permissible substitution): Admitting: Diagnostic Neuroimaging

## 2024-12-05 ENCOUNTER — Encounter: Payer: Self-pay | Admitting: Hematology & Oncology

## 2024-12-11 ENCOUNTER — Encounter: Payer: Self-pay | Admitting: Diagnostic Neuroimaging

## 2024-12-11 ENCOUNTER — Ambulatory Visit: Payer: Self-pay | Admitting: Diagnostic Neuroimaging

## 2024-12-11 ENCOUNTER — Encounter: Payer: Self-pay | Admitting: Hematology & Oncology

## 2024-12-11 VITALS — BP 140/82 | HR 86 | Ht 63.0 in | Wt 219.4 lb

## 2024-12-11 DIAGNOSIS — F0781 Postconcussional syndrome: Secondary | ICD-10-CM

## 2024-12-11 NOTE — Patient Instructions (Addendum)
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 - aim for at least 7-8 hours sleep per night (or more) - avoid smoking and alcohol - caution with medications, finances, driving - safety / supervision issues reviewed - follow up with PCP re: B12 defiency / iron deficiency screening and treatment (history of gastric bypass surgery)  MIGRAINE WITH AURA PREVENTION (~ 2-3 migraines per week; previously >20-25 migraine per month before botox  and vyepti; now aggravated by concussion in Sept 2025) - continue botox  migraine every 3 months - continue vyepti 300mg  every 3 months (offset from botox  by 1-2 months)  MIGRAINE RESCUE - continue ubrelvy  100mg  as needed - continue eletriptan  40mg  as needed "

## 2024-12-11 NOTE — Progress Notes (Signed)
 "  [facility]  PATIENT: Megan Davila DOB: July 09, 1966  REFERRING CLINICIAN: Arnaldo Juliene RAMAN, MD  HISTORY FROM: patient  REASON FOR VISIT: follow up   HISTORICAL  CHIEF COMPLAINT:  Chief Complaint  Patient presents with   RM 6     Patient is here for Worsening confusion, questionable TIA - she fell and hit the back of her head at work and had neck, arms and back pain. She also now has constant migraines, difficulty thinking/ focusing, and not able to work. Patient had to pay out of pocket due to loss of insurance.    Migraine    Needs medications refilled     HISTORY OF PRESENT ILLNESS:   UPDATE (12/11/24, VRP): Since last visit, has ongoing memory loss, headache, neck pain, back pain, arm / leg pain. Has tried tramadol, gabapentin , lyrica. Continues with botox , vyepti. Still seeing [facility] for pain issues.     UPDATE (09/10/24, VRP): 59 year old female here for evaluation of migraine follow-up.  Has been followed by Lauraine Born, NP and Dr. Ines for last few years.  Last Botox  injection on 08/27/2024.  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  Headaches have increased since that time slightly.  Before this recent fall she had been doing better with her headache management.  UPDATE (04/23/18, VRP): Since last visit, doing well. Tolerating meds. No alleviating or aggravating factors. Migraine --> < than 5 days per month and less severe.  UPDATE (10/18/17, VRP): Since last visit, doing well. Tolerating meds. No alleviating or aggravating factors. Had bad migraine in July and Aug 2018, now resolved. Avg 5 days per month of migraine. Also with some tremor since summer 2018.   UPDATE (06/19/17, MM): Megan Davila is a 59 year old female with a history of migraine headaches. She returns today for follow-up. She states that since her last  visit with Dr. Margaret she has essentially had a headache daily. She has come in for 2 infusions with temporary benefit. She reports that her headache is located across the forehead and behind the eyes. She does have photophobia and phonophobia. She also reports nausea and vomiting. She is currently taking Zonegran  and Depakote . She had gastric bypass surgery in 2002. She reports that she has been out of work due to the ongoing migraine. She denies any new neurological symptoms. She returns today for an evaluation.  UPDATE 04/25/17: Since last visit, HA continue (2-3 attacks per month; each lasting 2-3 days). Has gained some weight gradually in last 2 years (100 --> 167 lbs).   UPDATE 07/13/16: Since last visit, only 1-2 major HA in last year needing ER evaluation. May have had total 6-8 migraine attacks all year. Overall doing well. Tolerating TPX, VPA and zolmitriptan . Still with low iron levels (managed by Dr Timmy).   UPDATE 05/20/15: Since last visit, only 6 HA in last 1 year. Had stricture dilation at [facility], complicated by perforation and repair in May 2016. Fortunately, overall doing well now.  UPDATE 05/21/14: Having migraine HAs 3 months out of per year. When she has migraine, it usually lasts up to 1 week at a time, and then she runs out of triptan. Using maxalt and zomig  (alternating). Still on TPX and VPA for prevention. Still with stress related to work.  UPDATE 11/13/12 (JM):  She has been doing well without  any severe headaches until  today she has a headache 6/10 with sharp pains behind her eyes. She has not taken any pain medications this morning.  She has had a 7 lb weight loss since last visit now 117lbs, has a history of poor absorption with oral medications.     UPDATE 09/20/12 (JM):  Returns to office since 2011, headaches in the last couple of months, has been persistent with headaches.  She had gastric bypass 2002, pregnant 6 years ago. Was 180 and is now 125lbs.  Has increased  stress at work.  Tolerating Topamax  well but feels that when she takes she has a loss of appetite.  Helpful with headache.  Increase sensitivity to hearing, photosensitivity, tinnitus and nausea.  She feels the air filters at work are causing headaches.  She is unable to state how often has severe headaches.  Has pain behind both eyes.  Unable to take NSAID's secondary to anaphylaxis.  Seen PCP October 10 and prescribed Relapax but has not tried.    PRIOR HPI (04/01/10, VRP): 60 year old right-handed female with history of gastric bypass surgery presenting for evaluation of new onset severe left-sided headache on March 23, 2010. On March 23, 2010 patient developed sudden onset left-sided headache behind her left eye. This was associated with seeing spots, feeling nausea and photophobia/phonophobia.  Headache lasted for several days. On April 21 she was seen by her primary care physician who prescribed Maxalt (provided mild relief) and hydrocodone  (no relief). On April 24 she was evaluated at [facility] emergency room. Since that time her headaches have resolved. She does report persistent mild neck pain and difficulty sleeping. She does report remote history of similar headaches 10 years ago. There is no family history of migraine headache. She denies numbness or weakness in arms or legs.   REVIEW OF SYSTEMS: Full 14 system review of systems performed and negative except: neck pain waking weakness.    ALLERGIES: Allergies  Allergen Reactions   Naproxen Anaphylaxis    Other reaction(s): Other Goes unconcious   Amoxicillin -Pot Clavulanate Other (See Comments) and Rash    Mouth sores Mouth sores Mouth sores Mouth sores Mouth sores Mouth sores Mouth sores    Monistat [Miconazole] Other (See Comments)    burning    Sulfa Antibiotics Hives   Tioconazole Itching    HOME MEDICATIONS: Outpatient Medications Prior to Visit  Medication Sig Dispense Refill   acetaminophen  (TYLENOL ) 325 MG  tablet 1 tablet as needed     ALPRAZolam  (XANAX ) 1 MG tablet Take 1 mg by mouth 2 (two) times daily as needed. Anxiety     APLENZIN  348 MG TB24 Take 1 tablet by mouth every morning.     Armodafinil  250 MG tablet Take 250 mg by mouth every morning.  2   aspirin EC 81 MG tablet Take 81 mg by mouth daily. Swallow whole.     atorvastatin  (LIPITOR) 80 MG tablet Take 1 tablet (80 mg total) by mouth daily. PATIENT MUST CALL AND SCHEDULE ANNUAL APPOINTMENT FOR FURTHER REFILLS SECOND ATTEMPT 15 tablet 0   botulinum toxin Type A  (BOTOX ) 200 units injection Inject 200 Units into the muscle every 3 (three) months. 1 each 3   Calcium -Magnesium-Vitamin D  500-250-125 MG-MG-UNIT TABS 1 tablet with food Orally Three times a day     diphenhydrAMINE  (BENADRYL ) 12.5 MG/5ML elixir Take 12.5 mg by mouth every 6 (six) hours as needed for allergies.      eletriptan  (RELPAX ) 40 MG tablet Take 1 tablet (40 mg total) by mouth as  needed for migraine or headache. May repeat in 2 hours if needed. Max 2 tabs per day or 8 tabs per month. 10 tablet 12   famotidine (PEPCID) 20 MG tablet as needed.     fexofenadine  (ALLEGRA) 180 MG tablet Take 180 mg by mouth daily as needed. allergies     fluticasone  (FLONASE ) 50 MCG/ACT nasal spray Place 1 spray into both nostrils as needed for allergies.   1   lansoprazole (PREVACID) 15 MG capsule as needed.     losartan (COZAAR) 25 MG tablet Take 25 mg by mouth daily.     methocarbamol  (ROBAXIN ) 500 MG tablet Take 1 tablet (500 mg total) by mouth 2 (two) times daily. 20 tablet 0   Multiple Vitamin (MULTIVITAMIN) capsule 1 tablet Orally once a day     Multiple Vitamins-Minerals (MULTIVITAMIN WITH MINERALS) tablet Take 1 tablet by mouth daily.     nitrofurantoin , macrocrystal-monohydrate, (MACROBID ) 100 MG capsule Take 1 capsule (100 mg total) by mouth 2 (two) times daily. 10 capsule 0   nystatin  (MYCOSTATIN /NYSTOP ) powder Apply 1 application topically daily as needed (yeast infection). 30 g 2    nystatin -triamcinolone  (MYCOLOG II) cream Apply 1 application topically 2 (two) times daily. Apply to affected area BID for up to 7 days. 60 g 0   ondansetron  (ZOFRAN ) 4 MG tablet Take 1 tablet (4 mg total) by mouth every 8 (eight) hours as needed for nausea or vomiting. 20 tablet 5   promethazine  (PHENERGAN ) 25 MG suppository Place 25 mg rectally every 6 (six) hours as needed for nausea or vomiting.      ranitidine (ZANTAC) 150 MG capsule 1 capsule Orally Once a day before bedtime     TRINTELLIX 20 MG TABS tablet Take 20 mg by mouth daily.     Ubrogepant  (UBRELVY ) 100 MG TABS Take 1 tablet (100 mg total) by mouth as needed (take 1 tablet at onset of headache, can repeat in 2 hours if needed, max is 200 mg in 24 hours). 16 tablet 11   No facility-administered medications prior to visit.    PAST MEDICAL HISTORY: Past Medical History:  Diagnosis Date   Abnormal Pap smear    Anemia    Anxiety    Arthritis    neck   Broken toe 12-22-15   middle toe right foot   Cataract of both eyes    Depression    GERD (gastroesophageal reflux disease)    Headache(784.0)    Heart murmur    History of hiatal hernia 2021   small   History of kidney stones    Hypothyroidism    Intestinal malabsorption following gastrectomy 06/19/2015   Migraine    Perforated bowel (HCC)    Pernicious anemia 06/19/2015   PONV (postoperative nausea and vomiting)    Thyroid  disease    hypothyrodism    PAST SURGICAL HISTORY: Past Surgical History:  Procedure Laterality Date   CATARACT EXTRACTION, BILATERAL Bilateral 02/2016   CERVICAL BIOPSY  W/ LOOP ELECTRODE EXCISION  11/2013, 05/2018, 11/2019   LGSIL, negative for dysplasia, LGSIL and positive ECC with potential LGSIL  - respectively   CERVIX LESION DESTRUCTION  1993   CIN III, [facility], recurrence--YAG laser   CESAREAN SECTION  2008   CHOLECYSTECTOMY     COLONOSCOPY  10/2020   CYSTOSCOPY N/A 10/26/2020   Procedure: CYSTOSCOPY;  Surgeon: Cathlyn JAYSON Nikki Bobie FORBES,  MD;  Location: [facility];  Service: Gynecology;  Laterality: N/A;   GASTRIC BYPASS  2002  HYSTEROSCOPY WITH D & C  10/14/2011   Procedure: DILATATION AND CURETTAGE (D&C) /HYSTEROSCOPY;  Surgeon: Bobie DELENA Cary;  Location: [facility];  Service: Gynecology;  Laterality: Bilateral;   LASIK     NOSE SURGERY     STOMACH SURGERY  04/20/2015   dilation of connection between stomach and intestine, [facility]   TONSILLECTOMY     TONSILLECTOMY     TOTAL LAPAROSCOPIC HYSTERECTOMY WITH BILATERAL SALPINGO OOPHORECTOMY N/A 10/26/2020   Procedure: TOTAL LAPAROSCOPIC HYSTERECTOMY WITH BILATERAL SALPINGECTOMY;  Surgeon: Cathlyn JAYSON Cary Bobie FORBES, MD;  Location: [facility];  Service: Gynecology;  Laterality: N/A;   TUBAL LIGATION  2008    FAMILY HISTORY: Family History  Problem Relation Age of Onset   Diabetes Mother    Heart disease Father    Social phobia Father    Psychiatric Illness Father    Hypertension Father    Stroke Father    Dementia Father    Hypertension Maternal Grandmother    Thyroid  disease Maternal Grandmother    Hypertension Paternal Grandmother    Stroke Paternal Grandfather    Heart disease Other    Diabetes Other    Stroke Other    Breast cancer Neg Hx    Migraines Neg Hx    Seizures Neg Hx    Sleep apnea Neg Hx     SOCIAL HISTORY:  Social History   Socioeconomic History   Marital status: Divorced    Spouse name: Not on file   Number of children: 1   Years of education: College   Highest education level: Not on file  Occupational History    Employer: [employer]  Tobacco Use   Smoking status: Never   Smokeless tobacco: Never  Vaping Use   Vaping status: Never Used  Substance and Sexual Activity   Alcohol use: Not Currently    Comment: Rarely   Drug use: No   Sexual activity: Yes    Partners: Male    Birth control/protection: Surgical    Comment: tubal/Hyst  Other Topics Concern   Not on file  Social History Narrative    Pt lives at home with her family.   Caffeine  Use: once daily   Social Drivers of Health   Tobacco Use: Low Risk (12/11/2024)   Patient History    Smoking Tobacco Use: Never    Smokeless Tobacco Use: Never    Passive Exposure: Not on file  Financial Resource Strain: Medium Risk (04/05/2023)   Received from [facility]   Overall Financial Resource Strain (CARDIA)    Difficulty of Paying Living Expenses: Somewhat hard  Food Insecurity: Food Insecurity Present (04/05/2023)   Received from [facility]   Epic    Within the past 12 months, you worried that your food would run out before you got the money to buy more.: Sometimes true    Within the past 12 months, the food you bought just didn't last and you didn't have money to get more.: Sometimes true  Transportation Needs: No Transportation Needs (04/05/2023)   Received from [facility] - Transportation    Lack of Transportation (Medical): No    Lack of Transportation (Non-Medical): No  Physical Activity: Not on file  Stress: Not on file  Social Connections: Unknown (04/15/2022)   Received from [facility]   Social Network    Social Network: Not on file  Intimate Partner Violence: Unknown (03/07/2022)   Received from [facility]   HITS  Physically Hurt: Not on file    Insult or Talk Down To: Not on file    Threaten Physical Harm: Not on file    Scream or Curse: Not on file  Depression (EYV7-0): Not on file  Alcohol Screen: Not on file  Housing: Not on file  Utilities: Not At Risk (04/05/2023)   Received from [facility] Utilities    Threatened with loss of utilities: No  Health Literacy: Not on file     PHYSICAL EXAM  Vitals:   12/11/24 1324  BP: (!) 140/82  Pulse: 86  SpO2: 98%  Weight: 219 lb 6.4 oz (99.5 kg)  Height: 5' 3 (1.6 m)   Not recorded     Body mass index is 38.86 kg/m.  GENERAL EXAM: Patient is in no distress; well developed, nourished and groomed; neck is  supple  CARDIOVASCULAR: Regular rate and rhythm, no murmurs, no carotid bruits  NEUROLOGIC: MENTAL STATUS: awake, alert, language fluent, comprehension intact, naming intact, fund of knowledge appropriate CRANIAL NERVE:  pupils equal and reactive to light, visual fields full to confrontation, extraocular muscles intact, no nystagmus, facial sensation and strength symmetric, hearing intact, palate elevates symmetrically, uvula midline, shoulder shrug symmetric, tongue midline. MOTOR: FINE POSTURAL TREMOR (SUBTLE); normal bulk and tone, full strength in the BUE, BLE SENSORY: normal and symmetric to light touch COORDINATION: finger-nose-finger, fine finger movements normal REFLEXES: deep tendon reflexes present and symmetric GAIT/STATION: narrow based gait; DECR ARM SWING; CAUTIOUS GAIT    DIAGNOSTIC DATA (LABS, IMAGING, TESTING) - I reviewed patient records, labs, notes, testing and imaging myself where available.  Lab Results  Component Value Date   WBC 11.2 (H) 10/03/2024   HGB 13.8 10/03/2024   HCT 43.4 10/03/2024   MCV 96.7 10/03/2024   PLT 358 10/03/2024      Component Value Date/Time   NA 143 10/03/2024 1539   NA 143 06/27/2023 0911   NA 140 09/07/2016 1327   K 3.8 10/03/2024 1539   K 3.4 (L) 09/07/2016 1327   CL 104 10/03/2024 1539   CO2 28 10/03/2024 1539   CO2 22 09/07/2016 1327   GLUCOSE 95 10/03/2024 1539   GLUCOSE 83 09/07/2016 1327   BUN 10 10/03/2024 1539   BUN 7 06/27/2023 0911   BUN 13.3 09/07/2016 1327   CREATININE 1.16 (H) 10/03/2024 1539   CREATININE 0.92 08/11/2021 1214   CREATININE 0.8 09/07/2016 1327   CALCIUM  9.0 10/03/2024 1539   CALCIUM  8.2 (L) 09/07/2016 1327   PROT 6.8 10/03/2024 1539   PROT 7.2 10/18/2017 1630   PROT 6.3 (L) 09/07/2016 1327   ALBUMIN 3.6 10/03/2024 1539   ALBUMIN 4.6 10/18/2017 1630   ALBUMIN 3.2 (L) 09/07/2016 1327   AST 28 10/03/2024 1539   AST 23 10/14/2020 1506   AST 11 09/07/2016 1327   ALT 70 (H) 10/03/2024 1539    ALT 20 10/14/2020 1506   ALT <9 09/07/2016 1327   ALKPHOS 89 10/03/2024 1539   ALKPHOS 54 09/07/2016 1327   BILITOT 0.5 10/03/2024 1539   BILITOT 0.4 10/14/2020 1506   BILITOT 0.41 09/07/2016 1327   GFRNONAA 56 (L) 10/03/2024 1539   GFRNONAA >60 10/14/2020 1506   GFRAA >60 06/03/2020 1309   Lab Results  Component Value Date   CHOL 206 (H) 06/27/2023   HDL 51 06/27/2023   LDLCALC 128 (H) 06/27/2023   TRIG 150 (H) 06/27/2023   CHOLHDL 4.0 06/27/2023   Lab Results  Component Value Date  HGBA1C 5.8 10/04/2022   Lab Results  Component Value Date   VITAMINB12 279 10/14/2020   Lab Results  Component Value Date   TSH 1.920 10/07/2020    04/17/10 MRI brain - Essentially normal MRI brain.  There are few non-specific foci of gliosis which can be seen in association with chronic migraine headaches, and are of doubtful clinical significance.  04/17/10 MRA head - normal  01/09/13 CT head - normal  10/03/24 MRI brain 1. No acute intracranial abnormality. 2. Mild chronic microvascular ischemic disease for age.    ASSESSMENT AND PLAN  59 y.o. year old female here with history of gastric bypass surgery and intermittent headaches, consistent with migraine with aura.   MEDS TRIED (I reviewed with patient 09/10/24): ANALGESICS: Tylenol , Excedrin ANTI-MIGRAINE: Zomig , Relpax , Maxalt HEART/BP: Verapamil ANTI-NAUSEANT: Phenergan  NSAIDS: Aleve, Naproxen MUSCLE RELAXANTS: Tizanidine  ANTI-CONVULSANTS: Topamax , Zonegran , Depakote , gabapentin  ANTI-DEPRESSANTS: impramine (dry mouth), effexor , Prozac, wellbutrin  OTHER: Aimovig (slightly helpful), Ajovy, emgality PROCEDURES FOR HEADACHES: botox , vyepti   Dx:  Post concussion syndrome    PLAN:                                                                                                                                                                 POST-CONCUSSION SYNDROME (fell at work 08/26/24; ongoing memory lapse, headache, neck  pain, back pain; MRI brain unremarkable in Oct 2025; symptoms should gradually improve over time) - 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 - aim for at least 7-8 hours sleep per night (or more) - avoid smoking and alcohol - caution with medications, finances, driving - safety / supervision issues reviewed - follow up with PCP re: B12 defiency / iron deficiency screening and treatment (history of gastric bypass surgery)  MIGRAINE WITH AURA PREVENTION (~ 2-3 migraines per week; previously >20-25 migraine per month before botox  and vyepti; now aggravated by concussion in Sept 2025) - continue botox  migraine every 3 months - continue vyepti 300mg  every 3 months (offset from botox  by 1-2 months)  MIGRAINE RESCUE - continue ubrelvy  100mg  as needed - continue eletriptan  40mg  as needed  No orders of the defined types were placed in this encounter.  No follow-ups on file.    EDUARD FABIENE HANLON, MD 12/11/2024, 3:16 PM Certified in Neurology, Neurophysiology and Neuroimaging  [facility] [address], [address] [place], [place] [address] [phone]  "

## 2024-12-15 ENCOUNTER — Other Ambulatory Visit: Payer: Self-pay | Admitting: Neurology

## 2024-12-16 NOTE — Telephone Encounter (Signed)
 Last seen on 11/21/24 Follow up scheduled on 09/11/25 ( in person visit)

## 2024-12-18 ENCOUNTER — Encounter: Payer: Self-pay | Admitting: Hematology & Oncology

## 2024-12-26 ENCOUNTER — Emergency Department (HOSPITAL_COMMUNITY)
Admission: EM | Admit: 2024-12-26 | Discharge: 2024-12-26 | Disposition: A | Discharge status: Home / Self Care | Attending: Emergency Medicine | Admitting: Emergency Medicine

## 2024-12-26 ENCOUNTER — Other Ambulatory Visit: Payer: Self-pay

## 2024-12-26 ENCOUNTER — Encounter: Payer: Self-pay | Admitting: Hematology & Oncology

## 2024-12-26 ENCOUNTER — Emergency Department (HOSPITAL_COMMUNITY)

## 2024-12-26 DIAGNOSIS — R519 Headache, unspecified: Secondary | ICD-10-CM | POA: Diagnosis not present

## 2024-12-26 DIAGNOSIS — Z7982 Long term (current) use of aspirin: Secondary | ICD-10-CM | POA: Insufficient documentation

## 2024-12-26 DIAGNOSIS — E039 Hypothyroidism, unspecified: Secondary | ICD-10-CM | POA: Diagnosis not present

## 2024-12-26 DIAGNOSIS — R109 Unspecified abdominal pain: Secondary | ICD-10-CM | POA: Insufficient documentation

## 2024-12-26 LAB — COMPREHENSIVE METABOLIC PANEL WITH GFR
ALT: 22 U/L (ref 0–44)
AST: 36 U/L (ref 15–41)
Albumin: 4.3 g/dL (ref 3.5–5.0)
Alkaline Phosphatase: 106 U/L (ref 38–126)
Anion gap: 12 (ref 5–15)
BUN: 17 mg/dL (ref 6–20)
CO2: 26 mmol/L (ref 22–32)
Calcium: 9.2 mg/dL (ref 8.9–10.3)
Chloride: 99 mmol/L (ref 98–111)
Creatinine, Ser: 1.03 mg/dL — ABNORMAL HIGH (ref 0.44–1.00)
GFR, Estimated: >60 mL/min
Glucose, Bld: 94 mg/dL (ref 70–99)
Potassium: 3.6 mmol/L (ref 3.5–5.1)
Sodium: 137 mmol/L (ref 135–145)
Total Bilirubin: 0.7 mg/dL (ref 0.0–1.2)
Total Protein: 7.3 g/dL (ref 6.5–8.1)

## 2024-12-26 LAB — CBC WITH DIFFERENTIAL/PLATELET
Abs Immature Granulocytes: 0.01 K/uL (ref 0.00–0.07)
Basophils Absolute: 0 K/uL (ref 0.0–0.1)
Basophils Relative: 1 %
Eosinophils Absolute: 0.2 K/uL (ref 0.0–0.5)
Eosinophils Relative: 3 %
HCT: 44.3 % (ref 36.0–46.0)
Hemoglobin: 14.5 g/dL (ref 12.0–15.0)
Immature Granulocytes: 0 %
Lymphocytes Relative: 48 %
Lymphs Abs: 3.2 K/uL (ref 0.7–4.0)
MCH: 30.7 pg (ref 26.0–34.0)
MCHC: 32.7 g/dL (ref 30.0–36.0)
MCV: 93.7 fL (ref 80.0–100.0)
Monocytes Absolute: 0.5 K/uL (ref 0.1–1.0)
Monocytes Relative: 7 %
Neutro Abs: 2.8 K/uL (ref 1.7–7.7)
Neutrophils Relative %: 41 %
Platelets: 342 K/uL (ref 150–400)
RBC: 4.73 MIL/uL (ref 3.87–5.11)
RDW: 11.9 % (ref 11.5–15.5)
WBC: 6.7 K/uL (ref 4.0–10.5)
nRBC: 0 % (ref 0.0–0.2)

## 2024-12-26 LAB — I-STAT CHEM 8, ED
BUN: 18 mg/dL (ref 6–20)
Calcium, Ion: 1.11 mmol/L — ABNORMAL LOW (ref 1.15–1.40)
Chloride: 99 mmol/L (ref 98–111)
Creatinine, Ser: 1.1 mg/dL — ABNORMAL HIGH (ref 0.44–1.00)
Glucose, Bld: 91 mg/dL (ref 70–99)
HCT: 45 % (ref 36.0–46.0)
Hemoglobin: 15.3 g/dL — ABNORMAL HIGH (ref 12.0–15.0)
Potassium: 3.5 mmol/L (ref 3.5–5.1)
Sodium: 138 mmol/L (ref 135–145)
TCO2: 27 mmol/L (ref 22–32)

## 2024-12-26 LAB — URINALYSIS, ROUTINE W REFLEX MICROSCOPIC
Bilirubin Urine: NEGATIVE
Glucose, UA: NEGATIVE mg/dL
Hgb urine dipstick: NEGATIVE
Ketones, ur: NEGATIVE mg/dL
Leukocytes,Ua: NEGATIVE
Nitrite: NEGATIVE
Protein, ur: NEGATIVE mg/dL
Specific Gravity, Urine: 1.015 (ref 1.005–1.030)
pH: 5 (ref 5.0–8.0)

## 2024-12-26 LAB — LIPASE, BLOOD: Lipase: 18 U/L (ref 11–51)

## 2024-12-26 MED ORDER — MORPHINE SULFATE (PF) 2 MG/ML IV SOLN
2.0000 mg | Freq: Once | INTRAVENOUS | Status: AC
  Administered 2024-12-26: 2 mg via INTRAVENOUS
  Filled 2024-12-26: qty 1

## 2024-12-26 MED ORDER — PROCHLORPERAZINE EDISYLATE 10 MG/2ML IJ SOLN
10.0000 mg | Freq: Once | INTRAMUSCULAR | Status: AC
  Administered 2024-12-26: 10 mg via INTRAVENOUS
  Filled 2024-12-26: qty 2

## 2024-12-26 MED ORDER — LACTATED RINGERS IV BOLUS
1000.0000 mL | Freq: Once | INTRAVENOUS | Status: AC
  Administered 2024-12-26: 1000 mL via INTRAVENOUS

## 2024-12-26 MED ORDER — DIPHENHYDRAMINE HCL 50 MG/ML IJ SOLN
12.5000 mg | Freq: Once | INTRAMUSCULAR | Status: AC
  Administered 2024-12-26: 12.5 mg via INTRAVENOUS
  Filled 2024-12-26: qty 1

## 2024-12-26 MED ORDER — IOHEXOL 350 MG/ML SOLN
75.0000 mL | Freq: Once | INTRAVENOUS | Status: AC | PRN
  Administered 2024-12-26: 75 mL via INTRAVENOUS

## 2024-12-26 MED ORDER — ONDANSETRON HCL 4 MG/2ML IJ SOLN
4.0000 mg | Freq: Once | INTRAMUSCULAR | Status: AC
  Administered 2024-12-26: 4 mg via INTRAVENOUS
  Filled 2024-12-26: qty 2

## 2024-12-26 NOTE — ED Triage Notes (Signed)
 Patient reports migraine flare-up this evening unrelieved by Rx medications , history of chronic migraine headache , mild photophobia /no emesis .

## 2024-12-26 NOTE — Discharge Instructions (Addendum)
 Your CT scan of the head and abdomen was reassuring.  Remainder of the blood work was reassuring.  No concerning cause for your symptoms.  Your symptoms improved after the medicines in the emergency department.  Follow-up with your primary care doctor.  Return for any emergent symptoms.

## 2024-12-26 NOTE — ED Provider Notes (Signed)
 " [place] [facility] Provider Note   CSN: [id number] Arrival date & time: 12/26/24  9593     Patient presents with: Migraine Flare up   Megan Davila is a 59 y.o. female.   59 year old female with past medical history significant for chronic low back pain, cervical stenosis, migraines who presents today for concern of generalized pain particularly in her abdomen without any associated nausea, vomiting, fever, and headache.  Has taken her home migraine medications without improvement.  The history is provided by the patient. No language interpreter was used.       Prior to Admission medications  Medication Sig Start Date End Date Taking? Authorizing Provider  acetaminophen  (TYLENOL ) 325 MG tablet 1 tablet as needed    [provider]  ALPRAZolam  (XANAX ) 1 MG tablet Take 1 mg by mouth 2 (two) times daily as needed. Anxiety    [provider]  APLENZIN  348 MG TB24 Take 1 tablet by mouth every morning. 06/30/22   [provider]  Armodafinil  250 MG tablet Take 250 mg by mouth every morning. 05/24/18   [provider]  aspirin EC 81 MG tablet Take 81 mg by mouth daily. Swallow whole.    [provider]  atorvastatin  (LIPITOR) 80 MG tablet Take 1 tablet (80 mg total) by mouth daily. PATIENT MUST CALL AND SCHEDULE ANNUAL APPOINTMENT FOR FURTHER REFILLS SECOND ATTEMPT 08/20/24   Lelon Hamilton T, PA-C  botulinum toxin Type A  (BOTOX ) 200 units injection Inject 200 Units into the muscle every 3 (three) months. 07/31/24   Gayland Lauraine PARAS, NP  Calcium -Magnesium-Vitamin D  [id number] MG-MG-UNIT TABS 1 tablet with food Orally Three times a day 08/06/15   [provider]  diphenhydrAMINE  (BENADRYL ) 12.5 MG/5ML elixir Take 12.5 mg by mouth every 6 (six) hours as needed for allergies.     [provider]  eletriptan  (RELPAX ) 40 MG tablet Take 1 tablet (40 mg total) by mouth as needed for migraine or headache.  May repeat in 2 hours if needed. Max 2 tabs per day or 8 tabs per month. 09/10/24   Penumalli, Vikram R, MD  famotidine (PEPCID) 20 MG tablet as needed. 06/09/20   [provider]  fexofenadine  (ALLEGRA) 180 MG tablet Take 180 mg by mouth daily as needed. allergies    [provider]  fluticasone  (FLONASE ) 50 MCG/ACT nasal spray Place 1 spray into both nostrils as needed for allergies.  01/17/17   [provider]  lansoprazole (PREVACID) 15 MG capsule as needed.    [provider]  losartan (COZAAR) 25 MG tablet Take 25 mg by mouth daily. 11/01/22   [provider]  methocarbamol  (ROBAXIN ) 500 MG tablet Take 1 tablet (500 mg total) by mouth 2 (two) times daily. 09/11/24   Beverley Leita LABOR, PA-C  Multiple Vitamin (MULTIVITAMIN) capsule 1 tablet Orally once a day    [provider]  Multiple Vitamins-Minerals (MULTIVITAMIN WITH MINERALS) tablet Take 1 tablet by mouth daily.    [provider]  nitrofurantoin , macrocrystal-monohydrate, (MACROBID ) 100 MG capsule Take 1 capsule (100 mg total) by mouth 2 (two) times daily. 10/03/24   Bundy, Joshua S, PA-C  nystatin  (MYCOSTATIN /NYSTOP ) powder Apply 1 application topically daily as needed (yeast infection). 07/28/21   Amundson C Silva, Brook E, MD  nystatin -triamcinolone  (MYCOLOG II) cream Apply 1 application topically 2 (two) times daily. Apply to affected area BID for up to 7 days. 04/22/20   Cathlyn JAYSON Cary, Brook E,  MD  ondansetron  (ZOFRAN ) 4 MG tablet Take 1 tablet (4 mg total) by mouth every 8 (eight) hours as needed for nausea or vomiting. 11/21/24   Gayland Lauraine PARAS, NP  promethazine  (PHENERGAN ) 25 MG suppository Place 25 mg rectally every 6 (six) hours as needed for nausea or vomiting.  01/10/20   [provider]  ranitidine (ZANTAC) 150 MG capsule 1 capsule Orally Once a day before bedtime 02/25/15   [provider]  TRINTELLIX 20 MG TABS tablet Take 20 mg by mouth daily. 06/30/22    [provider]  UBRELVY  100 MG TABS TAKE 1 TABLET(100 MG) BY MOUTH AT ONSET OF HEADACHE AS NEEDED. MAY REPEAT IN 2 HOURS AS NEEDED. MAX IS 200 MG IN 24 HOURS 12/16/24   Penumalli, Vikram R, MD    Allergies: Naproxen, Amoxicillin -pot clavulanate, Monistat [miconazole], Sulfa antibiotics, and Tioconazole    Review of Systems  Constitutional:  Negative for chills and fever.  Eyes:  Positive for photophobia. Negative for visual disturbance.  Respiratory:  Negative for shortness of breath.   Cardiovascular:  Negative for chest pain.  Gastrointestinal:  Positive for abdominal pain. Negative for nausea and vomiting.  Genitourinary:  Negative for dysuria.  Neurological:  Positive for headaches. Negative for light-headedness.  All other systems reviewed and are negative.   Updated Vital Signs BP (!) 149/107 (BP Location: Left Arm)   Pulse 96   Temp 97.9 F (36.6 C)   Resp 16   LMP 03/08/2019   SpO2 97%   Physical Exam Vitals and nursing note reviewed.  Constitutional:      General: She is not in acute distress.    Appearance: Normal appearance. She is not ill-appearing.  HENT:     Head: Normocephalic and atraumatic.     Nose: Nose normal.  Eyes:     Conjunctiva/sclera: Conjunctivae normal.  Cardiovascular:     Rate and Rhythm: Normal rate and regular rhythm.  Pulmonary:     Effort: Pulmonary effort is normal. No respiratory distress.     Breath sounds: No wheezing.  Abdominal:     General: There is no distension.     Tenderness: There is no abdominal tenderness. There is no right CVA tenderness, left CVA tenderness, guarding or rebound.  Musculoskeletal:        General: No deformity. Normal range of motion.  Skin:    Findings: No rash.  Neurological:     General: No focal deficit present.     Mental Status: She is alert and oriented to person, place, and time. Mental status is at baseline.     Cranial Nerves: No cranial nerve deficit.     Motor: No weakness.      Comments: 3 days cranial nerves III through XII intact.  Good range of motion in bilateral upper and lower extremities with 5/5 strength in extensor and flexor muscle groups.  No facial droop.  No pronator drift.  Pupils equal round reactive to light.     (all labs ordered are listed, but only abnormal results are displayed) Labs Reviewed  CBC WITH DIFFERENTIAL/PLATELET  COMPREHENSIVE METABOLIC PANEL WITH GFR  LIPASE, BLOOD  URINALYSIS, ROUTINE W REFLEX MICROSCOPIC  I-STAT CHEM 8, ED    EKG: None  Radiology: No results found.   Procedures   Medications Ordered in the ED  ondansetron  (ZOFRAN ) injection 4 mg (has no administration in time range)  lactated ringers  bolus 1,000 mL (has no administration in time range)  prochlorperazine  (COMPAZINE ) injection 10 mg (  has no administration in time range)  diphenhydrAMINE  (BENADRYL ) injection 12.5 mg (has no administration in time range)  morphine  (PF) 2 MG/ML injection 2 mg (has no administration in time range)    Clinical Course as of 12/26/24 1334  Thu Dec 26, 2024  1304 .  CBC, CMP without acute concerns.  CT head and CT abdomen pelvis without acute intra-abdominal finding or intracranial findings.  UA pending. [AA]    Clinical Course User Index [AA] Hildegard Loge, PA-C                                 Medical Decision Making Amount and/or Complexity of Data Reviewed Labs: ordered. Radiology: ordered.  Risk Prescription drug management.   Medical Decision Making / ED Course   This patient presents to the ED for concern of headache, abdominal pain, this involves an extensive number of treatment options, and is a complaint that carries with it a high risk of complications and morbidity.  The differential diagnosis includes migraine headache, appendicitis, colitis, diverticulitis, UTI, pyelonephritis, acute on chronic pain  MDM: 59 year old female presents with above-mentioned plaints.  Hemodynamically she is stable with the  exception of slight hypertension, without acute distress on exam.  Will obtain labs, CT abdomen pelvis, provide pain control, obtain CT head.  CBC unremarkable, CMP without acute concern.  UA negative.  CT abdomen pelvis with contrast, CT head without acute findings.  Patient improved on reevaluation  Ultimately discharged. Return precautions given.  Lab Tests: -I ordered, reviewed, and interpreted labs.   The pertinent results include:   Labs Reviewed  CBC WITH DIFFERENTIAL/PLATELET  COMPREHENSIVE METABOLIC PANEL WITH GFR  LIPASE, BLOOD  URINALYSIS, ROUTINE W REFLEX MICROSCOPIC  I-STAT CHEM 8, ED      EKG  EKG Interpretation Date/Time:    Ventricular Rate:    PR Interval:    QRS Duration:    QT Interval:    QTC Calculation:   R Axis:      Text Interpretation:           Imaging Studies ordered: I ordered imaging studies including T abdomen pelvis with contrast, CT head I independently visualized and interpreted imaging. I agree with the radiologist interpretation   Medicines ordered and prescription drug management: Meds ordered this encounter  Medications   ondansetron  (ZOFRAN ) injection 4 mg   lactated ringers  bolus 1,000 mL   prochlorperazine  (COMPAZINE ) injection 10 mg   diphenhydrAMINE  (BENADRYL ) injection 12.5 mg   morphine  (PF) 2 MG/ML injection 2 mg    -I have reviewed the patients home medicines and have made adjustments as needed    Reevaluation: After the interventions noted above, I reevaluated the patient and found that they have :improved  Co morbidities that complicate the patient evaluation  Past Medical History:  Diagnosis Date   Abnormal Pap smear    Anemia    Anxiety    Arthritis    neck   Broken toe 12-22-15   middle toe right foot   Cataract of both eyes    Depression    GERD (gastroesophageal reflux disease)    Headache(784.0)    Heart murmur    History of hiatal hernia 2021   small   History of kidney stones     Hypothyroidism    Intestinal malabsorption following gastrectomy 06/19/2015   Migraine    Perforated bowel (HCC)    Pernicious anemia 06/19/2015   PONV (postoperative nausea  and vomiting)    Thyroid  disease    hypothyrodism      Dispostion: Discharged in stable condition.   Final diagnoses:  Abdominal pain, unspecified abdominal location  Bad headache    ED Discharge Orders     None          Hildegard Loge, PA-C 01/02/25 1923    Emil Share, DO 01/03/25 [id number]  "

## 2025-01-07 ENCOUNTER — Telehealth: Payer: Self-pay | Admitting: Neurology

## 2025-01-07 NOTE — Telephone Encounter (Signed)
 Submitted PA renewal request via CMM, status is pending. Key: [id number]

## 2025-02-20 ENCOUNTER — Ambulatory Visit: Admitting: Neurology

## 2025-09-11 ENCOUNTER — Ambulatory Visit: Admitting: Neurology
